# Patient Record
Sex: Male | Born: 1935 | Race: White | Hispanic: No | Marital: Married | Smoking: Never smoker
Health system: Southern US, Community
[De-identification: ages and names within clinical notes are randomized; demographics above are authoritative.]

## PROBLEM LIST (undated history)

## (undated) DIAGNOSIS — J3800 Paralysis of vocal cords and larynx, unspecified: Secondary | ICD-10-CM

## (undated) DIAGNOSIS — C801 Malignant (primary) neoplasm, unspecified: Secondary | ICD-10-CM

## (undated) DIAGNOSIS — I428 Other cardiomyopathies: Secondary | ICD-10-CM

## (undated) DIAGNOSIS — I499 Cardiac arrhythmia, unspecified: Secondary | ICD-10-CM

## (undated) DIAGNOSIS — K589 Irritable bowel syndrome without diarrhea: Secondary | ICD-10-CM

## (undated) DIAGNOSIS — Z7901 Long term (current) use of anticoagulants: Secondary | ICD-10-CM

## (undated) DIAGNOSIS — R0789 Other chest pain: Secondary | ICD-10-CM

## (undated) DIAGNOSIS — I480 Paroxysmal atrial fibrillation: Secondary | ICD-10-CM

## (undated) DIAGNOSIS — R809 Proteinuria, unspecified: Secondary | ICD-10-CM

## (undated) DIAGNOSIS — K219 Gastro-esophageal reflux disease without esophagitis: Secondary | ICD-10-CM

## (undated) DIAGNOSIS — K5790 Diverticulosis of intestine, part unspecified, without perforation or abscess without bleeding: Secondary | ICD-10-CM

## (undated) DIAGNOSIS — F419 Anxiety disorder, unspecified: Secondary | ICD-10-CM

## (undated) DIAGNOSIS — I10 Essential (primary) hypertension: Secondary | ICD-10-CM

## (undated) HISTORY — DX: Other cardiomyopathies: I42.8

## (undated) HISTORY — DX: Malignant (primary) neoplasm, unspecified: C80.1

## (undated) HISTORY — DX: Paralysis of vocal cords and larynx, unspecified: J38.00

## (undated) HISTORY — DX: Proteinuria, unspecified: R80.9

## (undated) HISTORY — PX: CARDIAC CATHETERIZATION: SHX172

## (undated) HISTORY — DX: Other chest pain: R07.89

## (undated) HISTORY — DX: Irritable bowel syndrome, unspecified: K58.9

## (undated) HISTORY — DX: Essential (primary) hypertension: I10

## (undated) HISTORY — DX: Anxiety disorder, unspecified: F41.9

## (undated) HISTORY — DX: Diverticulosis of intestine, part unspecified, without perforation or abscess without bleeding: K57.90

## (undated) HISTORY — DX: Paroxysmal atrial fibrillation: I48.0

## (undated) HISTORY — PX: OTHER SURGICAL HISTORY: SHX169

## (undated) HISTORY — DX: Gastro-esophageal reflux disease without esophagitis: K21.9

## (undated) HISTORY — DX: Long term (current) use of anticoagulants: Z79.01

---

## 2004-07-01 ENCOUNTER — Inpatient Hospital Stay (HOSPITAL_BASED_OUTPATIENT_CLINIC_OR_DEPARTMENT_OTHER): Admission: RE | Admit: 2004-07-01 | Discharge: 2004-07-01 | Payer: Self-pay | Admitting: *Deleted

## 2004-11-18 ENCOUNTER — Ambulatory Visit: Payer: Self-pay | Admitting: Internal Medicine

## 2005-03-02 ENCOUNTER — Ambulatory Visit: Payer: Self-pay | Admitting: Internal Medicine

## 2005-06-08 ENCOUNTER — Ambulatory Visit: Payer: Self-pay | Admitting: Neurology

## 2005-06-26 ENCOUNTER — Ambulatory Visit: Payer: Self-pay | Admitting: Neurology

## 2005-08-05 ENCOUNTER — Ambulatory Visit: Payer: Self-pay | Admitting: Internal Medicine

## 2005-08-16 ENCOUNTER — Emergency Department: Payer: Self-pay | Admitting: Emergency Medicine

## 2005-08-16 ENCOUNTER — Other Ambulatory Visit: Payer: Self-pay

## 2005-08-17 ENCOUNTER — Other Ambulatory Visit: Payer: Self-pay

## 2005-08-21 ENCOUNTER — Ambulatory Visit: Payer: Self-pay | Admitting: Unknown Physician Specialty

## 2005-08-28 ENCOUNTER — Ambulatory Visit: Payer: Self-pay | Admitting: Internal Medicine

## 2005-09-03 ENCOUNTER — Ambulatory Visit: Payer: Self-pay

## 2005-09-22 ENCOUNTER — Ambulatory Visit: Payer: Self-pay | Admitting: Internal Medicine

## 2005-11-25 ENCOUNTER — Ambulatory Visit: Payer: Self-pay | Admitting: Internal Medicine

## 2006-01-26 ENCOUNTER — Ambulatory Visit: Payer: Self-pay | Admitting: Internal Medicine

## 2006-04-09 ENCOUNTER — Ambulatory Visit: Payer: Self-pay | Admitting: Internal Medicine

## 2006-04-30 ENCOUNTER — Ambulatory Visit: Payer: Self-pay | Admitting: Unknown Physician Specialty

## 2006-06-01 ENCOUNTER — Ambulatory Visit: Payer: Self-pay | Admitting: Gastroenterology

## 2006-06-09 ENCOUNTER — Ambulatory Visit: Payer: Self-pay | Admitting: Internal Medicine

## 2006-06-09 ENCOUNTER — Inpatient Hospital Stay (HOSPITAL_COMMUNITY): Admission: EM | Admit: 2006-06-09 | Discharge: 2006-06-10 | Payer: Self-pay | Admitting: Emergency Medicine

## 2006-06-10 ENCOUNTER — Ambulatory Visit: Payer: Self-pay | Admitting: Internal Medicine

## 2006-06-14 ENCOUNTER — Ambulatory Visit: Payer: Self-pay | Admitting: Internal Medicine

## 2006-06-14 ENCOUNTER — Inpatient Hospital Stay (HOSPITAL_BASED_OUTPATIENT_CLINIC_OR_DEPARTMENT_OTHER): Admission: RE | Admit: 2006-06-14 | Discharge: 2006-06-14 | Payer: Self-pay | Admitting: Internal Medicine

## 2006-06-21 ENCOUNTER — Ambulatory Visit: Payer: Self-pay | Admitting: Internal Medicine

## 2006-06-24 ENCOUNTER — Ambulatory Visit: Payer: Self-pay | Admitting: Gastroenterology

## 2006-07-02 ENCOUNTER — Ambulatory Visit: Payer: Self-pay | Admitting: Internal Medicine

## 2006-08-09 ENCOUNTER — Ambulatory Visit: Payer: Self-pay | Admitting: Internal Medicine

## 2006-08-12 ENCOUNTER — Ambulatory Visit: Payer: Self-pay

## 2006-08-18 ENCOUNTER — Ambulatory Visit: Payer: Self-pay | Admitting: Internal Medicine

## 2006-08-31 ENCOUNTER — Ambulatory Visit: Payer: Self-pay | Admitting: Internal Medicine

## 2006-11-25 ENCOUNTER — Ambulatory Visit: Payer: Self-pay | Admitting: Internal Medicine

## 2007-04-04 ENCOUNTER — Ambulatory Visit: Payer: Self-pay | Admitting: Internal Medicine

## 2007-05-27 ENCOUNTER — Ambulatory Visit: Payer: Self-pay | Admitting: Internal Medicine

## 2007-09-22 ENCOUNTER — Ambulatory Visit: Payer: Self-pay | Admitting: Internal Medicine

## 2008-04-16 ENCOUNTER — Ambulatory Visit: Payer: Self-pay | Admitting: Internal Medicine

## 2008-08-04 ENCOUNTER — Emergency Department: Payer: Self-pay | Admitting: Emergency Medicine

## 2008-10-22 ENCOUNTER — Ambulatory Visit: Payer: Self-pay | Admitting: Internal Medicine

## 2009-02-02 ENCOUNTER — Observation Stay (HOSPITAL_COMMUNITY): Admission: EM | Admit: 2009-02-02 | Discharge: 2009-02-03 | Payer: Self-pay | Admitting: Emergency Medicine

## 2009-02-02 ENCOUNTER — Ambulatory Visit: Payer: Self-pay | Admitting: Cardiology

## 2009-02-08 ENCOUNTER — Ambulatory Visit: Payer: Self-pay | Admitting: Gastroenterology

## 2009-02-18 ENCOUNTER — Ambulatory Visit: Payer: Self-pay | Admitting: Internal Medicine

## 2009-06-19 ENCOUNTER — Ambulatory Visit: Payer: Self-pay | Admitting: Cardiology

## 2009-06-19 ENCOUNTER — Telehealth (INDEPENDENT_AMBULATORY_CARE_PROVIDER_SITE_OTHER): Payer: Self-pay | Admitting: Radiology

## 2009-06-19 ENCOUNTER — Encounter: Payer: Self-pay | Admitting: Cardiology

## 2009-06-19 DIAGNOSIS — R079 Chest pain, unspecified: Secondary | ICD-10-CM | POA: Insufficient documentation

## 2009-06-20 ENCOUNTER — Ambulatory Visit: Payer: Self-pay

## 2009-06-20 ENCOUNTER — Encounter: Payer: Self-pay | Admitting: Internal Medicine

## 2009-06-25 ENCOUNTER — Telehealth: Payer: Self-pay | Admitting: Cardiology

## 2009-06-26 ENCOUNTER — Ambulatory Visit: Payer: Self-pay | Admitting: Cardiology

## 2009-06-26 ENCOUNTER — Ambulatory Visit: Payer: Self-pay

## 2009-06-26 ENCOUNTER — Encounter: Payer: Self-pay | Admitting: Cardiology

## 2009-06-28 ENCOUNTER — Ambulatory Visit (HOSPITAL_COMMUNITY): Admission: RE | Admit: 2009-06-28 | Discharge: 2009-06-28 | Payer: Self-pay | Admitting: Cardiology

## 2009-06-28 LAB — CONVERTED CEMR LAB
BUN: 13 mg/dL (ref 6–23)
CO2: 26 meq/L (ref 19–32)
Glucose, Bld: 99 mg/dL (ref 70–99)
Sodium: 137 meq/L (ref 135–145)

## 2009-07-01 ENCOUNTER — Encounter: Payer: Self-pay | Admitting: Cardiology

## 2009-07-01 ENCOUNTER — Ambulatory Visit: Payer: Self-pay | Admitting: Internal Medicine

## 2009-07-01 ENCOUNTER — Encounter (INDEPENDENT_AMBULATORY_CARE_PROVIDER_SITE_OTHER): Payer: Self-pay | Admitting: *Deleted

## 2009-07-03 LAB — CONVERTED CEMR LAB
CO2: 23 meq/L (ref 19–32)
Calcium: 9.1 mg/dL (ref 8.4–10.5)
Chloride: 103 meq/L (ref 96–112)
INR: 1.7 — ABNORMAL HIGH (ref 0.0–1.5)
MCV: 82.1 fL (ref 78.0–100.0)
Potassium: 4.8 meq/L (ref 3.5–5.3)
Prothrombin Time: 20.3 s — ABNORMAL HIGH (ref 11.6–15.2)
RBC: 5.04 M/uL (ref 4.22–5.81)
Sodium: 135 meq/L (ref 135–145)
WBC: 6.8 10*3/uL (ref 4.0–10.5)

## 2009-07-04 ENCOUNTER — Inpatient Hospital Stay (HOSPITAL_BASED_OUTPATIENT_CLINIC_OR_DEPARTMENT_OTHER): Admission: RE | Admit: 2009-07-04 | Discharge: 2009-07-04 | Payer: Self-pay | Admitting: Cardiology

## 2009-07-04 ENCOUNTER — Ambulatory Visit: Payer: Self-pay | Admitting: Cardiology

## 2009-07-05 ENCOUNTER — Telehealth: Payer: Self-pay | Admitting: Cardiology

## 2009-07-08 ENCOUNTER — Ambulatory Visit: Payer: Self-pay | Admitting: Cardiovascular Disease

## 2009-07-22 ENCOUNTER — Ambulatory Visit: Payer: Self-pay | Admitting: Internal Medicine

## 2009-07-22 DIAGNOSIS — I951 Orthostatic hypotension: Secondary | ICD-10-CM | POA: Insufficient documentation

## 2009-07-22 DIAGNOSIS — R42 Dizziness and giddiness: Secondary | ICD-10-CM | POA: Insufficient documentation

## 2009-07-22 DIAGNOSIS — I10 Essential (primary) hypertension: Secondary | ICD-10-CM | POA: Insufficient documentation

## 2010-01-20 ENCOUNTER — Ambulatory Visit: Payer: Self-pay | Admitting: Internal Medicine

## 2010-02-05 ENCOUNTER — Encounter: Payer: Self-pay | Admitting: Internal Medicine

## 2010-06-04 ENCOUNTER — Encounter: Payer: Self-pay | Admitting: Internal Medicine

## 2010-08-04 ENCOUNTER — Ambulatory Visit: Payer: Self-pay | Admitting: Internal Medicine

## 2011-01-07 ENCOUNTER — Ambulatory Visit: Payer: Self-pay | Admitting: Gastroenterology

## 2011-01-20 NOTE — Assessment & Plan Note (Signed)
Summary: F6M/AMD   Visit Type:  Follow-up Primary Provider:  Francia Greaves MD, [facility]  CC:  "Doing well.".  History of Present Illness: [name] is seen in followup for paroxysmal atrial fibrillation in the setting of nonischemic coronary disease by recent catheterization and modest hypertension. He has had no intercurrent episodes since his last visit.  He continues to struggle with some degree of orthostasis. At our last visit we had him change his Flomax from a.m. to p.m. without any significant change. Most of his dizziness is post exertion  blood work was reviewed from Dr. Lin Givens office; the HDL was 41 the LDL was 114 otherwise was normal  Current Medications (verified): 1)  Diltiazem Hcl Coated Beads 120 Mg Xr24h-Cap (Diltiazem Hcl Coated Beads) .... Take 1 Capsule By Mouth Once A Day 2)  Coumadin 2.5 Mg Tabs (Warfarin Sodium) .... As Directed 3)  Flecainide Acetate 50 Mg Tabs (Flecainide Acetate) .... Take One Tablet By Mouth Every 12 Hours 4)  Diovan 40 Mg Tabs (Valsartan) .... Take One Tablet By Mouth Daily 5)  Flomax 0.4 Mg Xr24h-Cap (Tamsulosin Hcl) .... Take 1 By Mouth Once Daily 6)  Gabapentin 300 Mg Caps (Gabapentin) .... Take At Bedtime As Needed 7)  Protonix 40 Mg Tbec (Pantoprazole Sodium) .... Take 1 By Mouth Once Daily 8)  Alprazolam 0.25 Mg Tabs (Alprazolam) .... Take As Needed 9)  Cipro 500 Mg Tabs (Ciprofloxacin Hcl) .... As Directed For Inflammatio of Prostate 10)  Coq-10 50 Mg Caps (Coenzyme Q10) .Marland Kitchen.. 1 By Mouth Once Daily 11)  Co Q-10 30 Mg Caps (Coenzyme Q10) .Marland Kitchen.. 1 By Mouth Once Daily 12)  Fish Oil 1000 Mg Caps (Omega-3 Fatty Acids) .Marland Kitchen.. 1 By Mouth Once Daily 13)  Flax Seed Oil 1000 Mg Caps (Flaxseed (Linseed)) .Marland Kitchen.. 1 By Mouth Once Daily 14)  [employer] Senior  Tabs (Multiple Vitamins-Minerals) .Marland Kitchen.. 1 By Mouth Once Daily 15)  Vitamin D3 400 Unit Tabs (Cholecalciferol) .Marland Kitchen.. 1 By Mouth Once Daily  Allergies (verified): 1)  ! Sulfa  Past  History:  Past Medical History: Last updated: 07/22/2009 1. Nonobstructive coronary artery disease.     a.     By cardiac catheterization in 2007.  Repeat 2010 also essentially normal     b.     Atypical chest pain 2/10, again in 6/10-7/10.  ETT-myoview was done 7/10.  Pt exercised 10 minutes, 11.7 METS, but only reached 75% MPHR.  He stopped due to fatigue.  No ECG changes.  There appeared to be a small area of apical lateral ischemia.  EF was calculated at 49% but appeared better visually.  Confirmatory echo showed normal LV systolic function.  2. Normal ventricular function. 3. Paroxysmal atrial fibrillation.     a.     Treated with flecainide. 4. Chronic Coumadin. 5. Gastroesophageal reflux disease.  6. Irritable bowel syndrome 7. Diverticulosis 8. HTN  Past Surgical History: Last updated: 01/20/2010 none  Family History: Last updated: 06/19/2009 Was reviewed and is significant for his parents living to old age.      Social History: Last updated: 06/19/2009 The patient is married and lives in [place] with his wife.  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  There is no history of tobacco or alcohol use.  He has three children.  He exercises twice a day.  Vital Signs:  Patient profile:   77 year old male Height:      71 inches Weight:      198 pounds BMI:  27.72 Pulse rate:   60 / minute BP sitting:   138 / 82  (right arm) Cuff size:   regular  Vitals Entered By: Bishop Dublin, CMA (August 04, 2010 4:16 PM)  Physical Exam  General:  The patient was alert and oriented in no acute distress. HEENT Normal.  Neck veins were flat, carotids were brisk.  Lungs were clear.  Heart sounds were regular without murmurs or gallops.  Abdomen was soft with active bowel sounds. There is no clubbing cyanosis or edema. Skin Warm and dry    Impression & Recommendations:  Problem # 1:  ATRIAL FIBRILLATION (ICD-427.31) no intercurrent AF His updated medication list for this problem  includes:    Coumadin 2.5 Mg Tabs (Warfarin sodium) .Marland Kitchen... As directed    Flecainide Acetate 50 Mg Tabs (Flecainide acetate) .Marland Kitchen... Take one tablet by mouth every 12 hours  Orders: EKG w/ Interpretation (93000)  Problem # 2:  HYPERTENSION, BENIGN (ICD-401.1) adequately controlled; unfortunately complicated by some degree of orthostasis. Will continue him on his current medications and we again reviewed maneuvers to try to minimize symptoms His updated medication list for this problem includes:    Diltiazem Hcl Coated Beads 120 Mg Xr24h-cap (Diltiazem hcl coated beads) .Marland Kitchen... Take 1 capsule by mouth once a day    Diovan 40 Mg Tabs (Valsartan) .Marland Kitchen... Take one tablet by mouth daily  Problem # 3:  DIZZINESS (ICD-780.4) as noted above; maneuvers to try to minimize symptoms post exertion will probably be our best therapeutic target  Patient Instructions: 1)  Your physician recommends that you continue on your current medications as directed. Please refer to the Current Medication list given to you today. 2)  Your physician wants you to follow-up in:   6 months You will receive a reminder letter in the mail two months in advance. If you don't receive a letter, please call our office to schedule the follow-up appointment.

## 2011-01-20 NOTE — Assessment & Plan Note (Signed)
Summary: F4M/AMD   Visit Type:  Follow-up Primary Provider:  Francia Greaves MD, [facility]  CC:  no complaints.  History of Present Illness: [name] is seen in followup for paroxysmal atrial fibrillation in the setting of nonischemic coronary disease by recent catheterization and modest hypertension. He has had a couple of episodes over the last 4 months. One about 3 months ago that lasted a couple of hours while he was sleeping. He has had 2 in the last 3 weeks both of which have been relatively short; the latter of which occurred while napping.  He is to see Dr. Lin Burch in the next couple of weeks and his basic laboratories will be repeated at that time  Current Problems (verified): 1)  Hypertension, Benign  (ICD-401.1) 2)  Dizziness  (ICD-780.4) 3)  Atrial Fibrillation  (ICD-427.31) 4)  Chest Pain-unspecified  (ICD-786.50)  Current Medications (verified): 1)  Diltiazem Hcl Coated Beads 120 Mg Xr24h-Cap (Diltiazem Hcl Coated Beads) .... Take 1 Capsule By Mouth Once A Day 2)  Coumadin 2.5 Mg Tabs (Warfarin Sodium) .... As Directed 3)  Flecainide Acetate 50 Mg Tabs (Flecainide Acetate) .... Take One Tablet By Mouth Every 12 Hours 4)  Diovan 40 Mg Tabs (Valsartan) .... Take One Tablet By Mouth Daily 5)  Flomax 0.4 Mg Xr24h-Cap (Tamsulosin Hcl) .... Take 1 By Mouth Once Daily 6)  Gabapentin 300 Mg Caps (Gabapentin) .... Take At Bedtime As Needed 7)  Protonix 40 Mg Tbec (Pantoprazole Sodium) .... Take 1 By Mouth Once Daily 8)  Alprazolam 0.25 Mg Tabs (Alprazolam) .... Take As Needed 9)  Cipro 500 Mg Tabs (Ciprofloxacin Hcl) .... As Directed For Inflammatio of Prostate 10)  Coq-10 50 Mg Caps (Coenzyme Q10) .Marland Kitchen.. 1 By Mouth Once Daily 11)  Co Q-10 30 Mg Caps (Coenzyme Q10) .Marland Kitchen.. 1 By Mouth Once Daily 12)  Fish Oil 1000 Mg Caps (Omega-3 Fatty Acids) .Marland Kitchen.. 1 By Mouth Once Daily 13)  Flax Seed Oil 1000 Mg Caps (Flaxseed (Linseed)) .Marland Kitchen.. 1 By Mouth Once Daily 14)  Cent[employer] Senior  Tabs  (Multiple Vitamins-Minerals) .Marland Kitchen.. 1 By Mouth Once Daily 15)  Vitamin D3 400 Unit Tabs (Cholecalciferol) .Marland Kitchen.. 1 By Mouth Once Daily  Allergies (verified): 1)  ! Sulfa  Past History:  Past Medical History: Last updated: 07/22/2009 1. Nonobstructive coronary artery disease.     a.     By cardiac catheterization in 2007.  Repeat 2010 also essentially normal     b.     Atypical chest pain 2/10, again in 6/10-7/10.  ETT-myoview was done 7/10.  Pt exercised 10 minutes, 11.7 METS, but only reached 75% MPHR.  He stopped due to fatigue.  No ECG changes.  There appeared to be a small area of apical lateral ischemia.  EF was calculated at 49% but appeared better visually.  Confirmatory echo showed normal LV systolic function.  2. Normal ventricular function. 3. Paroxysmal atrial fibrillation.     a.     Treated with flecainide. 4. Chronic Coumadin. 5. Gastroesophageal reflux disease.  6. Irritable bowel syndrome 7. Diverticulosis 8. HTN  Family History: Last updated: 06/19/2009 Was reviewed and is significant for his parents living to old age.      Social History: Last updated: 06/19/2009 The patient is married and lives in [place] with his wife.  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  There is no history of tobacco or alcohol use.  He has three children.  He exercises twice a day.  Past Surgical  History: none   Vital Signs:  Patient profile:   77 year old male Height:      71 inches Weight:      199.50 pounds BMI:     27.93 Pulse rate:   62 / minute Pulse rhythm:   regular BP sitting:   138 / 72  (right arm) Cuff size:   regular  Vitals Entered By: Mercer Pod (January 20, 2010 9:53 AM)  Physical Exam  General:  The patient was alert and oriented in no acute distress. HEENT Normal.  Neck veins were flat, carotids were brisk.  Lungs were clear.  Heart sounds were regular without murmurs or gallops.  Abdomen was soft with active bowel sounds. There is no clubbing cyanosis  or edema. Skin Warm and dry    Impression & Recommendations:  Problem # 1:  ATRIAL FIBRILLATION (ICD-427.31)  patient continues to have paroxysms of atrial fibrillation. In the event that the frequency continues to be at this higherRate, we will need to make sure that there is nothing changed with his metabolic substrate i.e. thyroid and or hemoglobin.  We may have to consider up titrating his flecainide   His updated medication list for this problem includes:    Coumadin 2.5 Mg Tabs (Warfarin sodium) .Marland Kitchen... As directed    Flecainide Acetate 50 Mg Tabs (Flecainide acetate) .Marland Kitchen... Take one tablet by mouth every 12 hours  Problem # 2:  HYPERTENSION, BENIGN (ICD-401.1) well controlled on his current medication His updated medication list for this problem includes:    Diltiazem Hcl Coated Beads 120 Mg Xr24h-cap (Diltiazem hcl coated beads) .Marland Kitchen... Take 1 capsule by mouth once a day    Diovan 40 Mg Tabs (Valsartan) .Marland Kitchen... Take one tablet by mouth daily

## 2011-02-06 ENCOUNTER — Encounter: Payer: Self-pay | Admitting: Cardiovascular Disease

## 2011-02-06 ENCOUNTER — Encounter (INDEPENDENT_AMBULATORY_CARE_PROVIDER_SITE_OTHER): Payer: Medicare Other

## 2011-02-06 ENCOUNTER — Encounter: Payer: Self-pay | Admitting: Cardiology

## 2011-02-06 ENCOUNTER — Telehealth: Payer: Self-pay | Admitting: Cardiology

## 2011-02-06 DIAGNOSIS — I10 Essential (primary) hypertension: Secondary | ICD-10-CM

## 2011-02-06 DIAGNOSIS — R002 Palpitations: Secondary | ICD-10-CM

## 2011-02-13 ENCOUNTER — Ambulatory Visit (INDEPENDENT_AMBULATORY_CARE_PROVIDER_SITE_OTHER): Payer: Medicare Other | Admitting: Internal Medicine

## 2011-02-13 ENCOUNTER — Encounter: Payer: Self-pay | Admitting: Internal Medicine

## 2011-02-13 DIAGNOSIS — I4949 Other premature depolarization: Secondary | ICD-10-CM

## 2011-02-13 DIAGNOSIS — R5381 Other malaise: Secondary | ICD-10-CM

## 2011-02-13 DIAGNOSIS — I4891 Unspecified atrial fibrillation: Secondary | ICD-10-CM

## 2011-02-17 NOTE — Assessment & Plan Note (Signed)
Summary: f5m/amd/smw   Visit Type:  Follow-up Primary Provider:  Francia Greaves MD, [facility]  CC:  c/o palpitations. denies chest pain and SOB.Marland Kitchen  History of Present Illness: [name] is seen in followup for paroxysmal atrial fibrillation in the setting of nonischemic coronary disease by recent catheterization and modest hypertension. He has had no intercurrent episodes since his last visit.  his dizziness is much improved.  He recounts that when he had his atrial fibrillation episodes whichhe has not had any in a year that they are associated with significant posttermination lightheadedness  he recently had significant palpitations. He describes these as a pause followed by a very heavy beat. His are consistent with PVCs. Note that they are not present with exercise. The are frequently postprandial. blood work was reviewed from Dr. Lin Givens office; the HDL was 41 the LDL was 114 otherwise was normal  Current Medications (verified): 1)  Diltiazem Hcl Coated Beads 120 Mg Xr24h-Cap (Diltiazem Hcl Coated Beads) .... Take 1 Capsule By Mouth Once A Day 2)  Coumadin 2.5 Mg Tabs (Warfarin Sodium) .... As Directed 3)  Flecainide Acetate 50 Mg Tabs (Flecainide Acetate) .... Take One Tablet By Mouth Every 12 Hours 4)  Diovan 40 Mg Tabs (Valsartan) .... Take One Tablet By Mouth Daily 5)  Flomax 0.4 Mg Xr24h-Cap (Tamsulosin Hcl) .... Take 1 By Mouth Once Daily 6)  Gabapentin 300 Mg Caps (Gabapentin) .... Take At Bedtime As Needed 7)  Protonix 40 Mg Tbec (Pantoprazole Sodium) .... Take 1 By Mouth Once Daily 8)  Alprazolam 0.25 Mg Tabs (Alprazolam) .... Take As Needed 9)  Cipro 500 Mg Tabs (Ciprofloxacin Hcl) .... As Directed For Inflammatio of Prostate 10)  Coq-10 50 Mg Caps (Coenzyme Q10) .Marland Kitchen.. 1 By Mouth Once Daily 11)  Co Q-10 30 Mg Caps (Coenzyme Q10) .Marland Kitchen.. 1 By Mouth Once Daily 12)  Fish Oil 1000 Mg Caps (Omega-3 Fatty Acids) .Marland Kitchen.. 1 By Mouth Once Daily 13)  Flax Seed Oil 1000 Mg Caps  (Flaxseed (Linseed)) .Marland Kitchen.. 1 By Mouth Once Daily 14)  [employer] Senior  Tabs (Multiple Vitamins-Minerals) .Marland Kitchen.. 1 By Mouth Once Daily 15)  Vitamin D3 400 Unit Tabs (Cholecalciferol) .Marland Kitchen.. 1 By Mouth Once Daily  Allergies (verified): 1)  ! Sulfa  Past History:  Past Medical History: Last updated: 07/22/2009 1. Nonobstructive coronary artery disease.     a.     By cardiac catheterization in 2007.  Repeat 2010 also essentially normal     b.     Atypical chest pain 2/10, again in 6/10-7/10.  ETT-myoview was done 7/10.  Pt exercised 10 minutes, 11.7 METS, but only reached 75% MPHR.  He stopped due to fatigue.  No ECG changes.  There appeared to be a small area of apical lateral ischemia.  EF was calculated at 49% but appeared better visually.  Confirmatory echo showed normal LV systolic function.  2. Normal ventricular function. 3. Paroxysmal atrial fibrillation.     a.     Treated with flecainide. 4. Chronic Coumadin. 5. Gastroesophageal reflux disease.  6. Irritable bowel syndrome 7. Diverticulosis 8. HTN  Past Surgical History: Last updated: 01/20/2010 none  Family History: Last updated: 06/19/2009 Was reviewed and is significant for his parents living to old age.      Social History: Last updated: 06/19/2009 The patient is married and lives in [place] with his wife.  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  There is no history of tobacco or alcohol use.  He has three  children.  He exercises twice a day.  Vital Signs:  Patient profile:   77 year old male Height:      71 inches Weight:      198.25 pounds BMI:     27.75 Pulse rate:   54 / minute BP sitting:   108 / 70  (left arm)  Vitals Entered By: Lysbeth Galas CMA (February 13, 2011 10:18 AM)  Physical Exam  General:  The patient was alert and oriented in no acute distress. HEENT Normal.  Neck veins were flat, carotids were brisk.  Lungs were clear.  Heart sounds were regular without murmurs or gallops.  Abdomen was soft with  active bowel sounds. There is no clubbing cyanosis or edema. Skin Warm and dry    Impression & Recommendations:  Problem # 1:  ATRIAL FIBRILLATION (ICD-427.31) he will continue on his flecainide. We had a very lengthy discussion today related to the physiology of regularization of atrial fibrillation and the need for concomitant AV nodal blocking therapy. We will continue on his Coumadin. His updated medication list for this problem includes:    Coumadin 2.5 Mg Tabs (Warfarin sodium) .Marland Kitchen... As directed    Flecainide Acetate 50 Mg Tabs (Flecainide acetate) .Marland Kitchen... Take one tablet by mouth every 12 hours  Orders: EKG w/ Interpretation (93000)  Problem # 2:  PRESYNCOPE-POSTTERMINATION (ICD-780.4) Iconcerned about these presyncopal episodes associated with termination of his atrial fibrillation. I suspect is he having post termination pauses. 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 Will have him increase his magnesium supplementation. blood results are pending. We will review the potassium His updated medication list for this problem includes:    Diltiazem Hcl Coated Beads 120 Mg Xr24h-cap (Diltiazem hcl coated beads) .Marland Kitchen... Take 1 capsule by mouth once a day    Coumadin 2.5 Mg Tabs (Warfarin sodium) .Marland Kitchen... As directed    Flecainide Acetate 50 Mg Tabs (Flecainide acetate) .Marland Kitchen... Take one tablet by mouth every 12 hours  Problem # 4:  HYPERTENSION, BENIGN (ICD-401.1) well-controlled currently. I also wonder whether he may not feel to stop his Diovan now that he is on diltiazem. I haven't discussed this with his primary care physician. His updated medication list for this problem includes:    Diltiazem Hcl Coated Beads 120 Mg Xr24h-cap (Diltiazem hcl coated beads) .Marland Kitchen... Take 1 capsule by mouth once a day    Diovan 40 Mg Tabs (Valsartan) .Marland Kitchen... Take one tablet by mouth daily  Patient  Instructions: 1)  Your physician recommends that you schedule a follow-up appointment in: 6 months 2)  Your physician has recommended you make the following change in your medication: INCREASE Magnesium from 125mg  to 250mg  once daily.

## 2011-02-17 NOTE — Progress Notes (Signed)
Summary: Palpitations  Phone Note Call from Patient Call back at Home Phone [phone]   Caller: Self Call For: Graciela Husbands Summary of Call: Pt is c/o palpitations and wants to be checked.  Will be leaving home around 9:00 am for a 9:30 appt. Initial call taken by: Harlon Flor,  February 06, 2011 8:02 AM  Follow-up for Phone Call        patient called back with skipping heart beats, denies shortness of breath nor chest pain.  He does take flecanide for parxo. a-fib.  He feels that in the p.m. the symptoms of palpitations seem more frequent. The symptoms started about two days ago. The patient has a follow up appt. with Dr. Graciela Husbands on Feb. 24, 2011.  [phone] or [phone]. Follow-up by: Bishop Dublin, CMA,  February 06, 2011 8:27 AM  Additional Follow-up for Phone Call Additional follow up Details #1::        Told patient to come for a nurse visit to see if in A-Fib. Additional Follow-up by: Bishop Dublin, CMA,  February 06, 2011 10:34 AM     Appended Document: Palpitations V paced rhyth with bigeminy (PVC every other0. Not seen previously. Very mildly symptomatic. ALready on a B-blocker. He will follow up with Graciela Husbands and [employer]

## 2011-02-17 NOTE — Assessment & Plan Note (Signed)
Summary: Palpitations. ? A-Fib.  Nurse Visit   Vital Signs:  Patient profile:   77 year old male Height:      71 inches Weight:      197 pounds BMI:     27.58 Pulse rate:   52 / minute BP sitting:   132 / 81  (left arm) Cuff size:   regular  Vitals Entered By: Bishop Dublin, CMA (February 06, 2011 10:56 AM) CC: c/o palpiations with feeling frequent spell late in the afternoon.   Denies shortness of breath or chest pain. Comments Pt's EKG shows SR Bradycardia HR 52. Pt currently taking Flecainide 50mg  two times a day and Diltiazem 120mg  once daily. Pt states he feels sensation of "skipped beat" possibly PVC while at rest, has no problems while working out etc. Pt has f/u with Dr. Graciela Husbands next Friday 02/13/11. Pt will monitor symptoms, and otherwise will f/u next week.   Visit Type:  nurse visit  CC:  c/o palpiations with feeling frequent spell late in the afternoon.   Denies shortness of breath or chest pain..   Past History:  Past Medical History: Last updated: 07/22/2009 1. Nonobstructive coronary artery disease.     a.     By cardiac catheterization in 2007.  Repeat 2010 also essentially normal     b.     Atypical chest pain 2/10, again in 6/10-7/10.  ETT-myoview was done 7/10.  Pt exercised 10 minutes, 11.7 METS, but only reached 75% MPHR.  He stopped due to fatigue.  No ECG changes.  There appeared to be a small area of apical lateral ischemia.  EF was calculated at 49% but appeared better visually.  Confirmatory echo showed normal LV systolic function.  2. Normal ventricular function. 3. Paroxysmal atrial fibrillation.     a.     Treated with flecainide. 4. Chronic Coumadin. 5. Gastroesophageal reflux disease.  6. Irritable bowel syndrome 7. Diverticulosis 8. HTN  Past Surgical History: Last updated: 01/20/2010 none  Family History: Last updated: 06/19/2009 Was reviewed and is significant for his parents living to old age.      Social History: Last updated:  06/19/2009 The patient is married and lives in [place] with his wife.  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  There is no history of tobacco or alcohol use.  He has three children.  He exercises twice a day.   Current Medications (verified): 1)  Diltiazem Hcl Coated Beads 120 Mg Xr24h-Cap (Diltiazem Hcl Coated Beads) .... Take 1 Capsule By Mouth Once A Day 2)  Coumadin 2.5 Mg Tabs (Warfarin Sodium) .... As Directed 3)  Flecainide Acetate 50 Mg Tabs (Flecainide Acetate) .... Take One Tablet By Mouth Every 12 Hours 4)  Diovan 40 Mg Tabs (Valsartan) .... Take One Tablet By Mouth Daily 5)  Flomax 0.4 Mg Xr24h-Cap (Tamsulosin Hcl) .... Take 1 By Mouth Once Daily 6)  Gabapentin 300 Mg Caps (Gabapentin) .... Take At Bedtime As Needed 7)  Protonix 40 Mg Tbec (Pantoprazole Sodium) .... Take 1 By Mouth Once Daily 8)  Alprazolam 0.25 Mg Tabs (Alprazolam) .... Take As Needed 9)  Cipro 500 Mg Tabs (Ciprofloxacin Hcl) .... As Directed For Inflammatio of Prostate 10)  Coq-10 50 Mg Caps (Coenzyme Q10) .Marland Kitchen.. 1 By Mouth Once Daily 11)  Co Q-10 30 Mg Caps (Coenzyme Q10) .Marland Kitchen.. 1 By Mouth Once Daily 12)  Fish Oil 1000 Mg Caps (Omega-3 Fatty Acids) .Marland Kitchen.. 1 By Mouth Once Daily 13)  Flax Seed Oil 1000 Mg  Caps (Flaxseed (Linseed)) .Marland Kitchen.. 1 By Mouth Once Daily 14)  [employer] Senior  Tabs (Multiple Vitamins-Minerals) .Marland Kitchen.. 1 By Mouth Once Daily 15)  Vitamin D3 400 Unit Tabs (Cholecalciferol) .Marland Kitchen.. 1 By Mouth Once Daily  Allergies (verified): 1)  ! Sulfa

## 2011-04-07 LAB — CBC
HCT: 41.9 % (ref 39.0–52.0)
MCV: 84.5 fL (ref 78.0–100.0)
Platelets: 321 10*3/uL (ref 150–400)
RDW: 15.2 % (ref 11.5–15.5)
RDW: 15.6 % — ABNORMAL HIGH (ref 11.5–15.5)

## 2011-04-07 LAB — BASIC METABOLIC PANEL
BUN: 11 mg/dL (ref 6–23)
CO2: 27 mEq/L (ref 19–32)
Chloride: 103 mEq/L (ref 96–112)
GFR calc Af Amer: >60 mL/min (ref >60)
GFR calc non Af Amer: >60 mL/min (ref >60)
Potassium: 4.1 mEq/L (ref 3.5–5.1)
Sodium: 134 mEq/L — ABNORMAL LOW (ref 135–145)

## 2011-04-07 LAB — CARDIAC PANEL(CRET KIN+CKTOT+MB+TROPI)
Relative Index: INVALID (ref 0.0–2.5)
Relative Index: INVALID (ref 0.0–2.5)
Total CK: 80 U/L (ref 7–232)

## 2011-04-07 LAB — DIFFERENTIAL
Basophils Relative: 1 % (ref 0–1)
Lymphocytes Relative: 21 % (ref 12–46)
Lymphs Abs: 1.9 10*3/uL (ref 0.7–4.0)
Monocytes Absolute: 0.6 10*3/uL (ref 0.1–1.0)
Monocytes Relative: 7 % (ref 3–12)
Neutro Abs: 6.2 10*3/uL (ref 1.7–7.7)
Neutrophils Relative %: 70 % (ref 43–77)

## 2011-04-07 LAB — COMPREHENSIVE METABOLIC PANEL
ALT: 16 U/L (ref 0–53)
Albumin: 3.6 g/dL (ref 3.5–5.2)
Alkaline Phosphatase: 60 U/L (ref 39–117)
CO2: 27 mEq/L (ref 19–32)
Calcium: 9 mg/dL (ref 8.4–10.5)
Creatinine, Ser: 1.07 mg/dL (ref 0.4–1.5)
GFR calc non Af Amer: >60 mL/min (ref >60)
Sodium: 136 mEq/L (ref 135–145)
Total Bilirubin: 0.7 mg/dL (ref 0.3–1.2)

## 2011-04-07 LAB — CK TOTAL AND CKMB (NOT AT ARMC): CK, MB: 0.8 ng/mL (ref 0.3–4.0)

## 2011-04-07 LAB — PROTIME-INR: Prothrombin Time: 23.1 seconds — ABNORMAL HIGH (ref 11.6–15.2)

## 2011-04-07 LAB — TROPONIN I: Troponin I: <0.01 ng/mL (ref 0.00–0.06)

## 2011-04-27 ENCOUNTER — Telehealth: Payer: Self-pay | Admitting: Emergency Medicine

## 2011-04-27 NOTE — Telephone Encounter (Signed)
Pt called in today stating he wanted to f/u with you sooner. Scheduled pt for June 4th. Pt wanted me to let you know that he is having problems with going into A Fib more frequently. He wanted to know if he needed to change anything in his medication before his f/u on June 4th.

## 2011-05-04 NOTE — Telephone Encounter (Signed)
You can move up his appontiment if necessary thjanks steve

## 2011-05-05 NOTE — H&P (Signed)
NAME:  Robert Burch, Robert Burch NO.:  [id number]   MEDICAL RECORD NO.:  [id number]          PATIENT TYPE:  EMS   LOCATION:  MAJO                         FACILITY:  [facility]   PHYSICIAN:  Jonelle Sidle, MD DATE OF BIRTH:  20-Jan-1933   DATE OF ADMISSION:  02/02/2009  DATE OF DISCHARGE:                              HISTORY & PHYSICAL   PRIMARY CARDIOLOGIST:  Dr. Sherryl Manges.   PRIMARY CARE PHYSICIAN:  Dr. Francia Greaves.   REASON FOR ADMISSION:  Chest pain.   HISTORY OF PRESENT ILLNESS:  Robert Burch is a pleasant 77 year old male  with a history of paroxysmal atrial fibrillation that has been  reasonably well-controlled on flecainide, Cardizem CD and Coumadin.  He  was last seen by Dr. Graciela Husbands in the [facility] office back in November  2009 and reports that he did have two break-through episodes of atrial  fibrillation in January since that time, although has done reasonably  well over the last month.  He typically is active, exercising twice a  day by swimming in the morning and walking in the evening.  He underwent  a cardiac catheterization back in June 2007, which demonstrated minor  coronary atherosclerosis without any obstructive lesions and an overall  normal ejection fraction of 55% without mitral regurgitation.  He does  not report any typical exertional chest pain.  He states that since  Thursday of this week he has been experiencing an aching sensation in  the chest radiating across the shoulders, also associated with a feeling  of numbness in his fingers and toes and some weakness.  He thought that  it might be indigestion.  He can think of no specific precipitant for  this and in fact has gone ahead and exercised with the symptoms noting  no increase in intensity or duration.  He states that these symptoms  typically have a sudden onset and offset, although he is fairly clear in  not describing any palpitations with this.  He states that the symptoms  are  different from his typical atrial fibrillation.  He has continued to  manifest symptoms even today and became concerned, presenting to the  emergency department for further evaluation.  At this point, his cardiac  markers are normal and his electrocardiogram shows sinus rhythm at 60  beats per minute with a QRS duration of 120 milliseconds and nonspecific  T-wave changes.  Previous comparison tracing from 2007 available to me  now showed atrial fibrillation with nonspecific ST-T wave changes.  He  reports compliance with his medications and otherwise no typical reflux  symptoms.   ALLERGIES:  SULFA drugs.   MEDICATIONS AT HOME:  Include:  1. flecainide 50 mg p.o. b.i.d.  2. Diovan 40 mg p.o. daily.  3. Protonix 40 mg p.o., daily.  4. Gabapentin 300 mg p.o. q.h.s.  5. Flomax 0.4 mg p.o. daily.  6. Coumadin 2.5 mg p.o. daily.  7. Cardizem CD 120 mg p.o. daily.   PAST MEDICAL HISTORY:  Is as outlined above.  He has a history of:  1. Prostatitis.  2. Hypertension.  3. Gastroesophageal reflux disease.  SOCIAL HISTORY:  The patient is married and lives in [place] with his wife.  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  There is no history of tobacco or  alcohol use.  He has three children.  He exercises twice a day as  outlined.   FAMILY HISTORY:  Was reviewed and is significant for his parents living  to old age.   REVIEW OF SYSTEMS:  No recent cough, fevers or chills.  He has had no  recent palpitations.  No dizziness or syncope.  No bleeding problems on  Coumadin.  Otherwise negative.   EXAMINATION:  Heart rate is in the 70s in sinus rhythm.  The patient is  not hypoxic and is afebrile, blood pressure was 130/70 (he states that  it was as high as 160/90 at home when he checked it), respirations 18  and unlabored.  This is a well-nourished male in no acute distress without active  symptoms wearing glasses.  HEENT: Conjunctiva is normal.  Oropharynx clear.  NECK: Supple.  No  elevated jugular venous pressure, loud bruits or  thyromegaly.  LUNGS: Clear lungs with breathing at rest.  No wheezing or rhonchi.  CARDIAC:  Exam reveals a regular rate and rhythm.  Soft S4.  No loud  systolic murmur or pericardial rub.  ABDOMEN: Soft, nontender, good bowel sounds.  EXTREMITIES:  Exhibit no frank pitting edema.  Distal pulse are 2+.  SKIN:  Warm and dry.  MUSCULOSKELETAL:  No ecchymoses noted.  NEUROPSYCHIATRIC:  The patient alert x3.  Affect is appropriate.   LABORATORY DATA:  WBC 8.8, hemoglobin 14.1, platelets 321, INR 1.9.  Sodium 136, potassium 4.9, chloride with 103, bicarb 27, glucose 102,  BUN 10, creatinine 0.1, CK 57, CK-MB 0.8, troponin-I less than 0.01.   Chest x-ray demonstrates no acute process with some hyperinflation and  no focal air space disease.   IMPRESSION:  1. Atypical chest pain syndrome as detailed above, somewhat different      from previous chest pain symptoms and described as possibly      indigestion, although he is not bothered by these symptoms      typically on Protonix.  There has been no clear exertional      component or associated palpitations.  His initial point of care      cardiac markers are normal and his electrocardiogram is      nonspecific.  No interval ischemic testing since 2007.  2. Mild coronary atherosclerosis by cardiac catheterization in June      2007 with normal ejection fraction.  3. History of paroxysmal atrial fibrillation, reasonably well managed      on flecainide, Cardizem CD, and Coumadin.  Last break-through was      in January.   RECOMMENDATIONS:  I discussed the situation with the patient and his  wife.  We will arrange an observation admission overnight to cycle a  full set of markers, follow telemetry to exclude any transient  dysrhythmias, and have  him ambulate and evaluate for return of symptoms.  We will continue his  present outpatient regimen.  Follow-up labs will be obtained.  If he is   clinically stable and workup is unrevealing, we will anticipate  discharge home tomorrow for further outpatient evaluation in the office.  Otherwise, we can proceed as necessary.      Jonelle Sidle, MD  Electronically Signed     SGM/MEDQ  D:  02/02/2009  T:  02/02/2009  Job:  [id number]  cc:   Duke Salvia, MD, Rondel Oh, MD Johnnette Litter

## 2011-05-05 NOTE — Discharge Summary (Signed)
NAME:  Robert Burch, Robert Burch NO.:  [id number]   MEDICAL RECORD NO.:  [id number]          PATIENT TYPE:  INP   LOCATION:  2036                         FACILITY:  [facility]   PHYSICIAN:  Luis Abed, MD, FACCDATE OF BIRTH:  March 10, 1933   DATE OF ADMISSION:  02/02/2009  DATE OF DISCHARGE:  02/03/2009                               DISCHARGE SUMMARY   PRIMARY CARDIOLOGIST:  Duke Salvia, MD, [facility].   FINAL DIAGNOSES:  1. Atypical chest pain.      a.     Normal serial cardiac markers.   SECONDARY DIAGNOSES:  1. Nonobstructive coronary artery disease.      a.     By cardiac catheterization in 2007.  2. Normal ventricular function.  3. Paroxysmal atrial fibrillation.      a.     Treated with flecainide.  4. Chronic Coumadin.  5. Gastroesophageal reflux disease.   REASON FOR ADMISSION:  Mr. Mckinnon is a 77 year old male, with history  of nonobstructive coronary artery disease, who presented to the  emergency room with complaint of chest pain.  His symptoms were felt to  be atypical, and he was admitted for overnight observation and rule out  of myocardial infarction.   HOSPITAL COURSE:  The patient ruled out for myocardial infarction with  all serial cardiac markers within normal limits.  He was allowed to  ambulate and reported no symptoms suggestive of angina pectoris.   No evidence of dysrhythmia was noted on telemetry, during his brief  stay.  Serial EKGs indicated normal sinus rhythm/sinus bradycardia with  rates in the 55-60 bpm range.   Medication adjustments are notable only for the addition of aspirin,  reduced to low dose by time of discharge.  The patient is on chronic  Coumadin and maintained at therapeutic INR levels during his brief stay.   Further evaluation as an outpatient will be deferred to both his primary  cardiologist, Dr. Sherryl Manges, as well as to his primary care  physician, Dr. Francia Greaves in [place].   DISCHARGE DISPOSITION:   Stable.   DISCHARGE MEDICATIONS:  1. Enteric-coated aspirin 81 mg daily (new).  2. Coumadin 2.5 mg, as directed.  3. Cardizem 120 mg daily.  4. Flecainide 50 mg b.i.d.  5. Diovan 40 mg daily.  6. Flomax 0.4 mg daily.  7. Gabapentin 300 mg nightly, as needed.  8. Protonix 40 mg daily.   DISCHARGE DURATION:  Greater than 30 minutes.   DISCHARGE INSTRUCTIONS:  1. Arrangements will be made for Mr. Virgil to follow up with Dr.      Sherryl Manges, either in the [facility] or in our [facility].  The patient will be notified of the scheduled appointment.  2. The patient is also instructed to range follow up with Dr. Francia Greaves in the following 1-2 weeks.  She also follows and manages      his Coumadin dose.      Gene Serpe, PA-C      Luis Abed, MD, [facility]  Electronically Signed  GS/MEDQ  D:  02/03/2009  T:  02/03/2009  Job:  [id number]   cc:   [name] MD Lin Givens

## 2011-05-05 NOTE — Telephone Encounter (Signed)
Pt.notified

## 2011-05-05 NOTE — Cardiovascular Report (Signed)
NAME:  Robert Burch, VANDERWALL NO.:  [id number]   MEDICAL RECORD NO.:  [id number]          PATIENT TYPE:  OIB   LOCATION:  1962                         FACILITY:  [facility]   PHYSICIAN:  Marca Ancona, MD      DATE OF BIRTH:  [birth year]   DATE OF PROCEDURE:  07/04/2009  DATE OF DISCHARGE:                            CARDIAC CATHETERIZATION   PROCEDURES:  1. Left heart catheterization.  2. Coronary angiography.  3. Left ventriculography.   INDICATION:  This is a 77 year old with history of proximal atrial  fibrillation who has had multiple episodes of chest pain.  He did have  an equivocal Myoview done.  Given the fact that he is on flecainide, we  decided it would be safest to proceed with a left heart catheterization.   PROCEDURE NOTE:  After informed consent was obtained, the right groin  was sterilely prepped and draped, 1% lidocaine was used locally to  anesthetize the right groin area.  The right common femoral artery was  accessed using Seldinger technique and a 4-French arterial sheath was  placed.  The left coronary artery was engaged using the 4-French JL-4  catheter, the right coronary artery was engaged using the 4-French 3-D  RCA catheter, and the left ventricle was entered using a 4-French angled  pigtail catheter.  There were no complications.   FINDINGS:  1. Hemodynamics:  LV 141/19, aorta 140/80.  2. Left ventriculography.  EF was estimated at 55%-60%.  There were no      wall motion abnormalities.  There was no significant mitral      regurgitation.  3. Coronary angiography.  Coronary system was left dominant.  The RCA      was a small vessel without significant coronary disease.  The left      main had no significant disease.  The LAD had 3 moderate-sized      diagonals.  At about the level of the second diagonal, there was      about a 20% narrowing in the LAD.  Left circumflex was a large      dominant artery supplying the left PDA and there was no  significant      coronary disease.   ASSESSMENT/PLAN:  This is a 77 year old with paroxysmal atrial  fibrillation on flecainide who has had episodes of atypical chest pain  and a nondiagnostic Myoview.  Left heart catheterization shows very mild  nonobstructive coronary disease.  I think it would be safe for him to  continue on his flecainide.  We will restart his Coumadin tonight.      Marca Ancona, MD  Electronically Signed    DM/MEDQ  D:  07/04/2009  T:  07/04/2009  Job:  [id number]   cc:   Duke Salvia, MD, [facility]  Francia Greaves

## 2011-05-05 NOTE — Letter (Signed)
April 16, 2008    Dr. Francia Greaves  [address]  [place], [place] [address]   RE:  NICHOLAD, KAUTZMAN  MRN:  [id number]  /  DOB:  Apr 08, 1933   Dear [name]:   It was a pleasure seeing Robert Burch today in followup for his atrial  fibrillation.  He continues to do well.  He has had two brief  recurrences, one in October and one New Year's Eve, both of which lasted  just a number of hours and then terminated spontaneously.  He says he is  feeling as well as he has in 5 years.  He is having no problems with  chest pain or shortness of breath and certainly no exercise intolerance.   His medications include:  1. Flecainide at 60 mg twice daily.  2. Cardizem at 120 a day.  3. Multiple nutraceuticals.  4. Protonix.  5. Flomax.  6. Coumadin.   PHYSICAL EXAMINATION:  VITAL SIGNS:  His blood pressure was mildly  elevated though at 157/80 with a pulse of 56.  His weight was 196 which  is stable.  LUNGS:  Clear.  HEART:  Sounds were regular.  EXTREMITIES:  Without edema.   Electrocardiogram today demonstrated a sinus rhythm at 56 with intervals  of 0.17, 0.11, and 0.42.  The axis was right about 0 degrees.   IMPRESSION:  1. Paroxysmal atrial fibrillation.  2. Hypertension.  3. Coumadin therapy for the above.   Mr. Sciandra was doing quite well, [name].  If there is anything I can do  in the interim, please do not hesitate to contact me.  I will see him  again in 6 months' time.    Sincerely,      Duke Salvia, MD, [facility]  Electronically Signed    SCK/MedQ  DD: 04/16/2008  DT: 04/16/2008  Job #: [id number]

## 2011-05-05 NOTE — Letter (Signed)
September 22, 2007    Cheryl Jefferies,MD  [address]  [place], [place] [address]   RE:  TABER, SWEETSER  MRN:  [id number]  /  DOB:  [birth year]   Dr. [name]:   Mr. Satter comes in today.  He is feeling really well.  He has not had  any atrial fibrillation of which he is aware in the last 14 months or  so.  He continues to exercise briskly without problems with shortness of  breath or chest pain.  He has had no peripheral edema.   MEDICATIONS:  1. Diovan 40.  2. Cardizem 120 which he takes at night.  3. Protonix 40.  4. Flomax.  5. Flecainide 50 b.i.d.  6. Magnesium.   PHYSICAL EXAMINATION:  VITAL SIGNS:  His blood pressure was 15/284.  Looking at his home blood pressures, he runs in the 120 range to the 150  range.  LUNGS:  Clear.  HEART:  Sounds were regular.  EXTREMITIES:  Without edema.   Electrocardiogram dated September 22, 2007, demonstrated a sinus rhythm at  54 with intervals of 0.19, 0.11, 0.42 and the axis was 20 degrees.   IMPRESSION:  1. Paroxysmal atrial fibrillation.  2. Hypertension.  3. Borderline bradycardia.  4. Orthostatic hypotension.   Mr. Dowdell is doing really very well, [name].  I am concerned about his  systolic blood pressure.  He has had problems in the past with  orthostatic lightheadedness which prompted the decrease of his Diovan  from 80-40.  I wonder whether this is going to be tolerable in the long  term given his systolic blood pressures but I asked that he follow with  you in a couple of months.  I will plan to see him again in 6 months'  time.  We will see him in the [facility].  If there is anything I  can do please do not hesitate to contact me.    Sincerely,      Duke Salvia, MD, [facility]  Electronically Signed    SCK/MedQ  DD: 09/22/2007  DT: 09/22/2007  Job #: [id number]

## 2011-05-05 NOTE — Letter (Signed)
October 22, 2008    Francia Greaves, MD  [address]  [place], [place] [address]   RE:  HARRELL, NIEHOFF  MRN:  [id number]  /  DOB:  06-Jul-1933   Dear [name],   Mr. Ambrocio comes in, who is still doing very well with his atrial  fibrillation, has had only one episode since January.   He is currently off of Cardizem.  He says that I told him that was an  okay thing to do.   His only major complaint is orthostatic lightheadedness, particularly  with bending or after exerting himself while standing.   PHYSICAL EXAMINATION:  VITAL SIGNS:  His blood pressure is 150/92.  His  pulse was 56.  LUNGS:  Clear.  HEART:  Sounds were regular.  EXTREMITIES:  Without edema.  SKIN:  Warm and dry.   His medications relates to the above issues include:  1. Diovan 80.  2. Flecainide 50 b.i.d.  3. Cardizem 120 which he has intercurrently stopped on his own, but he      said that I told him he could, Coumadin, Protonix, and Flomax.   [name], I think Mr. Ozburn should be back on his Cardizem.  I am not  quite sure how I confused him to let him think that it was appropriate  to stop that, but in the setting of type 1C antiarrhythmic drug therapy,  some patients can develop atrial flutter which by the nature of the  drugs will be quite slow i.e. 220 beats per minute.  They can then have  a paradoxically increased ventricular response as they then go 1:1 in  their flutters, and so adjunctive therapy with AV nodal blocking agents  is typical with 1C therapy and so that I have asked him to go back on  his Cardizem.  This will also help his blood pressure.   As relates his orthostatic intolerance, his medications may be  contributing to this, but I do not know that there are a lot of other  options.   Thanks very much for allowing [place] to participate in the care of this  patient.  We will see him in about a year.    Sincerely,      Duke Salvia, MD, [facility]  Electronically Signed    SCK/MedQ  DD: 10/22/2008  DT: 10/23/2008  Job #: [id number]

## 2011-05-05 NOTE — Assessment & Plan Note (Signed)
 HEALTHCARE                         ELECTROPHYSIOLOGY OFFICE NOTE   NAME:Robert Burch, Robert Burch                     MRN:          [id number]  DATE:02/18/2009                            DOB:          [birth year]    Robert Burch is seen following a recent overnight hospitalization for  chest pain that was relatively atypical in that it was quite prolonged  lasting 10-12 hours at a time.  It was unassociated with enzymes.  He  was subsequently discharged with increase in his PPIs.  He saw Dr.  Lin Givens and then underwent ultrasound scanning of his abdomen at  [name] and this is apparently unrevealing.   His discomfort persists a little bit, but is gradually abating.  In  addition to his PPI increase, he was also put on a Gas-X type of  compound.   He has had two episodes of atrial fibrillation.  He was seen last in  November.  This is quite satisfactory for him.   MEDICATIONS:  1. Flecainide 50 b.i.d.  2. Cardizem 120.  3. Coumadin.  4. Flomax.  5. Gabapentin 300.  6. Protonix 40 b.i.d.   PHYSICAL EXAMINATION:  VITAL SIGNS:  His blood pressure was mildly  elevated at 142/82, his pulse was 66, his weight was 195 which is  stable.  LUNGS:  Clear.  HEART:  Sounds were regular.  NECK:  Veins were flat.  EXTREMITIES:  Without edema.   IMPRESSION:  1. Atrial fibrillation - paroxysmal, on flecainide.  2. Intercurrent episode of atypical chest pain in the setting of      normal catheterization 3 years ago, unlikely to represent cardiac      pain.   At this point, we will continue on his current course.  I have suggested  to Robert Burch that if he has more atrial fibrillation, we can gradually  uptitrate his flecainide as necessary.   We will plan to see him in 4 months' time.     Duke Salvia, MD, [facility]  Electronically Signed    SCK/MedQ  DD: 02/18/2009  DT: 02/19/2009  Job #: [id number]   cc:   Francia Greaves, MD

## 2011-05-08 NOTE — Cardiovascular Report (Signed)
NAME:  Robert Burch, Robert Burch NO.:  [id number]   MEDICAL RECORD NO.:  [id number]                   PATIENT TYPE:  OIB   LOCATION:  6501                                 FACILITY:  [facility]   PHYSICIAN:  Carole Binning, M.D. [facility]         DATE OF BIRTH:  02-04-1933   DATE OF PROCEDURE:  07/01/2004  DATE OF DISCHARGE:                              CARDIAC CATHETERIZATION   PROCEDURE PERFORMED:  Left heart catheterization with coronary angiography  and left ventriculography.   INDICATION:  Mr. Barbary is a 77 year old male with history of atrial  fibrillation.  He has been having symptoms of chest pain with typical and  atypical components.  He recently underwent a stress Cardiolite study.  This  suggested a small prior apical infarct, but no significant ischemia and wall  motion is normal.  The patient, however, has continued to have symptoms of  chest pain and was thus referred for cardiac catheterization to rule out  coronary artery disease.   CATHETERIZATION PROCEDURAL NOTE:  A 4 French sheath was placed in the right  femoral artery.  Coronary arteriography was performed standard Judkins 4  French catheters.  Left ventriculography was performed with an angled  pigtail catheter.  Contrast was Omnipaque.  There were no complications.   CATHETERIZATION RESULTS:   HEMODYNAMICS:  1. Left ventricular pressure 135/12.  2. Aortic pressure 135/68.  3. There is no aortic valve gradient.   LEFT VENTRICULOGRAM:  Wall motion is normal.  Ejection fraction is estimated  at 60%.  There is no mitral regurgitation.   CORONARY ARTERIOGRAPHY (LEFT DOMINANT):  Left main is normal.   Left anterior descending artery has a tubular 25% stenosis in the mid  vessel.  The LAD is otherwise normal giving rise to small first and second  diagonal branches and a normal size third diagonal branch.   Left circumflex is a dominant vessel.  It gives rise to a small ramus  intermedius and  a large first obtuse marginal branch.  There is a 20%  stenosis in the proximal portion of the first obtuse marginal.  The distal  circumflex gives rise to two normal size posterior lateral branches and a  normal size posterior descending artery.   Right coronary artery is a small nondominant vessel.  The right coronary is  normal.   IMPRESSION:  1. Normal left ventricular systolic function.  2. No significant coronary artery disease identified.   RECOMMENDATIONS:  Continued medical therapy.                                               Carole Binning, M.D. [facility]    MWP/MEDQ  D:  07/01/2004  T:  07/01/2004  Job:  [id number]   cc:   Duke Salvia, M.D.   Dr. Tora Duck  Lin Givens

## 2011-05-08 NOTE — Letter (Signed)
November 25, 2006    Charlene Scott  [address]  [place], [place] [address]   RE:  EDWAR, COE  MRN:  [id number]  /  DOB:  09-11-1933   Dear Westley Hummer:   Mr. Copeman comes in today.  He has been in sinus rhythm and has had no  afibs since August, he is thrilled.  He is feeling better.  He  continues to exercise vigorously.  His blood pressure is better today at  126/62, he is currently taking antibiotics for his prostate, otherwise  he is well.  His lungs were clear, heart sounds were regular and  extremities were without edema.   IMPRESSION:  1. Atrial fibrillation, paroxysmal, on flecainide and Cardizem.  2. Hypertension, currently adequately controlled.  3. Coumadin therapy.   Mr. Brislin is stable.  Will see him again in 4-5 months.  I hope this  letter finds you well, and Merry Christmas.    Sincerely,      Duke Salvia, MD, [facility]  Electronically Signed    SCK/MedQ  DD: 11/25/2006  DT: 11/25/2006  Job #: [id number]

## 2011-05-08 NOTE — Procedures (Signed)
[facility]                                EXERCISE TREADMILL   NAME:Robert Burch, Robert Burch                     MRN:          [id number]  DATE:08/12/2006                            DOB:          1933-08-09    EXERCISE TREADMILL TEST NOTE:   HISTORY:  [name] is a patient followed by Dr. Graciela Husbands with a history of  paroxysmal atrial fibrillation.  He recently saw Dr. Graciela Husbands with increased  frequency of episodes.  The patient was placed on flecainide and presents  today for routine treadmill to rule out ventricular arrhythmias.   EXERCISE TREADMILL TEST:  The patient exercised at Bruce protocol for 6  minutes 58 seconds, achieving a work level of 8.4 mets.  His heart rate went  from 63 beats per minute to a maximum of 112 beats per minute.  This  represents 74% of his maximal expected heart rate.  His blood pressure went  from 127/74 to a maximum of 144/68.  The patient complained of shortness of  breath but no chest pain.  The test was stopped secondary to fatigue.   ELECTROCARDIOGRAM:  Baseline normal sinus rhythm, with a heart rate of 60.  No ischemic changes.  There were no ST or T wave changes to suggest ischemia  or injury.  There were no ventricular arrhythmias noted.   IMPRESSION:  Suboptimal exercise treadmill test.  The patient achieved  maximum exercise without ventricular arrhythmias.   DISPOSITION:  The patient will remain on flecainide and follow up with Dr.  Graciela Husbands as scheduled.                                   Tereso Newcomer, PA-C                                Salvadore Farber, MD   SW/MedQ  DD:  08/12/2006  DT:  08/13/2006  Job #:  [id number]

## 2011-05-08 NOTE — Letter (Signed)
September 01, 2006     Dr. Francia Greaves  [address]  [place], [place] [address]   RE:  Robert Burch, Robert Burch  MRN:  [id number]  /  DOB:  12-05-1933   Dear [name]:   Robert Burch is seen.  He is in sinus rhythm.  He has been on Flecainide now  three weeks, has had no recurrent atrial fibrillation.  He is tolerating the  drug well.  He did have a couple of palpitations, feeling a hard heartbeat,  but these were very limited at the time.  His blood pressure is elevated  today at 154/87.  This is a little bit higher than normal.  Pulse is 51.  Lungs were clear.  Heart sounds were regular.   IMPRESSION:  1. Paroxysmal atrial fibrillation, now on Flecainide.  2. Hypertension.   [name], Robert Burch is doing quite well.  I wonder whether it is not worth  thinking about treating his hypertension, now that his numbers are steadily  in the 140 to 150 range.  If you do, I would think about using an Ace  inhibitor or an ARB observed in some studies that are showing that these the  decrease the frequency of his atrial fibrillation.   I hope this letter finds you well.   Sincerely,     Duke Salvia, MD, [facility]   SCK/MedQ  DD:  08/31/2006  DT:  09/01/2006  Job #:  [id number]

## 2011-05-08 NOTE — Letter (Signed)
April 04, 2007    Cheryl Jeffries,MD  [address]  [place], [place] [address]   RE:  Robert, Burch  MRN:  [id number]  /  DOB:  05/16/1933   Dear [name]:   [name] comes in today.  He is in sinus rhythm and has been  tolerating his flecainide for the last eight months without recurrence  of atrial fibrillation.  He is ecstatic.   His blood pressure was up a little bit and it looks like you started him  back on Diovan.  I applaud your choice.  The ARBs have been shown to  decrease frequencies of atrial fibrillation so it is well worth using  and up-titrating as necessary.   PHYSICAL EXAMINATION:  VITAL SIGNS:  Blood pressure 141/84, pulse 53.  LUNGS:  Clear.  CARDIOVASCULAR:  Regular.  EXTREMITIES:  Without edema.   His electrocardiogram today demonstrated sinus rhythm at 51 with  intervals of 0.17/0.12/0.45.   IMPRESSION:  1. Paroxysmal atrial fibrillation on flecainide.  2. Hypertension.  3. Borderline QR restoration.   [name] is going great.  We will see him again in six months.  Please let me know if there is anything I can do to help.    Sincerely,      Duke Salvia, MD, [facility]  Electronically Signed    SCK/MedQ  DD: 04/04/2007  DT: 04/04/2007  Job #: [id number]

## 2011-05-08 NOTE — Assessment & Plan Note (Signed)
[facility]                           ELECTROPHYSIOLOGY OFFICE NOTE   NAME:Hendrie, AHMAR PICKRELL                     MRN:          [id number]  DATE:08/09/2006                            DOB:          02/01/1933    HISTORY OF PRESENT ILLNESS:  [name] was seen.  He has paroxysmal atrial  fibrillation.  His episodes continue to wax and wane.  He is now having a  couple of episodes a month that are lasting about a day.  He has finally  decided to go on Flecainide after about a year and a half.  We have reviewed  again the proarrhythmic issues as well as the side effect profile.   He also complains of pain in his back.   MEDICATIONS:  Reviewed and are unchanged.   PHYSICAL EXAMINATION:  VITAL SIGNS:  Blood pressure 147/82, pulse 57.  LUNGS:  Clear, but markedly decreased breath sounds on the right compared to  the left.  EXTREMITIES:  Without edema.   STUDIES:  Electrocardiogram demonstrated sinus rhythm at 56 rhythms with  0.16/0.106/0.41.  The axis was 20 degrees.   IMPRESSION:  1. Atrial fibrillation.  2. Decreased breath sounds on the right associated with back pain.   PLAN:  We will plan to check a chest x-ray today.  We will give him the  prescription for the flecainide as noted previously, and will need to  undertake stress testing later this week.                                   Duke Salvia, MD, [facility]   SCK/MedQ  DD:  08/09/2006  DT:  08/09/2006  Job #:  [id number]   cc:   Francia Greaves, M.D.

## 2011-05-08 NOTE — Discharge Summary (Signed)
NAME:  Robert Burch, Robert Burch NO.:  [id number]   MEDICAL RECORD NO.:  [id number]          PATIENT TYPE:  INP   LOCATION:  3707                         FACILITY:  [facility]   PHYSICIAN:  Doylene Canning. Ladona Ridgel, M.D.  DATE OF BIRTH:  May 23, 1933   DATE OF ADMISSION:  06/09/2006  DATE OF DISCHARGE:  06/10/2006                                 DISCHARGE SUMMARY   ALLERGIES:  ALLERGIES ARE TO SULFA.   PRINCIPAL DIAGNOSIS:  1.  Admitted with recurrent chest pain.  The patient is in sinus rhythm on      admission through emergency room.  2.  Nonobstructive coronary artery disease at catheterization July 2005.      Ejection fraction 60%.  3.  Troponin-I study 0.01 then 0.01.  4.  Paroxysm of atrial fibrillation eruption with heart rate in the 120s at      04:35 in the morning of June 21.  The patient reversed  to sinus rhythm      at 11:17 the morning of June 21.  5.  The patient has lightheadedness at the time of termination of his atrial      fibrillation; he can tell onset and offset.  Sometimes he has pauses      with termination but not this morning.  6.  Atrial fibrillation episodes are  paroxysmal; the longest 22.5 hours,      most last 3 to 4 hours, frequency 1 to 2 a week, lately 1 every 2-1/2      weeks.   SECONDARY DIAGNOSES:  1.  Recent prostatitis.  2.  Recent abdominal pain.      1.  Reportedly negative abdominal CT scan.  3.  Hypertension.  4.  Gastroesophageal reflux disease.   PLAN:  1.  The patient's atrial fibrillation is fairly well-controlled on Cardizem.  2.  Plan is for the patient to start flecainide.  3.  The patient will need recent catheterization, which will be done at the      [facility], Monday, June 25 at 9:30 a.m.  4.  He will follow up with office visit, Dr. Graciela Husbands, on June 21, 2006 at      10:45.  5.  He will pick up a 30-day event monitor as he leaves the hospital from      the office of  [facility].   HISTORY OF PRESENT ILLNESS:  Mr.  Robert Burch is a 77 year old male.  He has a  history of nonobstructive coronary artery disease by catheterization July  2005.  He presents to the emergency room with recurrent chest pain  consistent with angina pectoris.  This has been ongoing for the last 3 days.   The patient states that these symptoms are dissimilar from those that he  experienced prior to his catheterization 2 years ago.  At that time, they  were sharp.  At that time, he underwent evaluation with an exercise stress  test.  It was abnormal.  Then he had the catheterization.  The study showed  25% mid LAD stenosis, 20% proximal OM-1 stenosis, normal right coronary  artery, normal  left ventricular function.  The patient also has a history  paroxysmal atrial fibrillation, followed by Dr. Graciela Husbands.  He is on chronic  Coumadin.  His rate is relatively well-controlled with Cardizem 120 mg  daily.  When he has a paroxysmal breakthrough he takes an extra 60 mg of  Cardizem every 6 hours as needed.  There has been discussion about putting  the patient on flecainide.  The patient has been reticent, wishing to clear  up some of his other medical problems prior to starting flecainide therapy.   The patient exercised on a regular basis until the past few days.  He  experienced some mild, dull chest tightness while swimming Monday morning.  He had some pain, which lasted 2 hours, later in the day, sitting and  watching TV.  Yesterday morning, after his usual morning walk, he had some  recurrent chest discomfort again; this lasted several hours.  On the day of  admission, June 20, while driving his car, he had recurrence of the pain,  and he decided to come to the emergency room.  The patient has been treated  with aspirin but not received any nitroglycerin.  He reports mild residual  discomfort.  Electrocardiogram shows no ischemic changes and initial cardiac  markers are negative.  He has recently taken his blood pressure, which was   reportedly 90/65.  He spoke to his primary care physician and stopped taking  Diovan as of yesterday, June 19.   The plan includes admitting the patient to telemetry, ruling out myocardial  infarction with serial cardiac markers.  His Coumadin will be held.  He may  need a repeat catheterization.  The patient will be started on aspirin and  added IV nitroglycerin.  Blood pressure will be followed closely.   HOSPITAL COURSE:  The patient presented in the emergency room on the evening  of June 19 with recurrent chest pain over a 3-day period.  Electrocardiogram  was nondiagnostic for acute myocardial infarction.  All troponin-I studies  were negative, 0.01 then 0.01.  His chest pain went away.  On hospital day  number 2, he had an eruption of his paroxysmal atrial fibrillation.  It  lasted approximately 7 hours.  He was mainly asymptomatic although he could  tell that his heart was racing.  Prior to taking a rescue dose of Cardizem,  however, he converted back to sinus rhythm.  He did not have a post-  termination pause with this.  The patient says he gets lightheaded at times  with atrial fibrillation, especially at termination, and he notes some  pausing with termination at times as well.  He is seen by Dr. Sherryl Manges.  It is known that he has an office with Dr. Graciela Husbands planned for July 2.  At  that time, they were once again going to undertake possibility of starting  flecainide.  To be sure, Dr. Graciela Husbands is ordering a catheterization to be done  Monday, June 25, to rule out ischemic source prior to starting flecainide.  This will be done at the [facility] by Dr. Gala Romney.  His Coumadin will be held  starting yesterday so that his INR will be less than 1.5 at the time he  comes to the [facility].   DISCHARGE MEDICATIONS:  He is discharged on the following medications:  1.  Protonix 40 mg daily.  2.  Zantac 150 mg at night. 3.  His Coumadin dose is 2 mg daily, except for 2.5 mg Wednesday,  but he  is      to hold the Coumadin until Monday June 25.  4.  Cardizem 120 mg daily with 60 mg to be taken for paroxysmal atrial      fibrillation every 6 hours as needed.  5.  Gabapentin 300 mg at night, 100 mg as needed.  6.  Flomax 0.4 mg daily.   Once again, the plan is to pick up an event monitor at [facility] on  the way home June 21.  He will present to [facility]  on the morning  of June 25 for protime, and then to the [facility] at [facility] by  8:30 on June 25.  Once again, he will follow up with Dr. Graciela Husbands July 2 at  10:45.   LABORATORY STUDIES THIS ADMISSION:  Complete blood count:  White cells are  7, hemoglobin 14.1, hematocrit 42.4, and platelets 269.  Serum electrolytes:  Sodium 133, potassium 4, chloride 100, bicarbonate 27,  BUN is 12, creatinine 0.9, glucose 101.  On the day of discharge, PT was  25.3, INR 2.3.  Coumadin continues to be held.  His alkaline phosphatase  this admission is 65, SGOT is 15, SGPT is 15.  Troponin-I studies are 0.01  then 0.01.  TSH is 1.921 this admission.      Maple Mirza, P.A.    ______________________________  Doylene Canning. Ladona Ridgel, M.D.    GM/MEDQ  D:  06/10/2006  T:  06/10/2006  Job:  [id number]   cc:   Duke Salvia, M.D.  [address]  [address]  [place]  [place] [address]   Francia Greaves, MD

## 2011-05-08 NOTE — H&P (Signed)
NAME:  Robert Burch, Robert Burch NO.:  [id number]   MEDICAL RECORD NO.:  [id number]          PATIENT TYPE:  INP   LOCATION:  1826                         FACILITY:  [facility]   PHYSICIAN:  Doylene Canning. Ladona Ridgel, M.D.  DATE OF BIRTH:  23-Mar-1933   DATE OF ADMISSION:  06/09/2006  DATE OF DISCHARGE:                                HISTORY & PHYSICAL   PRIMARY CARDIOLOGIST:  Duke Salvia, M.D.   REASON FOR ADMISSION:  Robert Burch is a 77 year old male with a history of  nonobstructive coronary artery disease by cardiac catheterization, July  possibly 2005, who now presents to the emergency room with complaint of 3-  day history of recurrent angina pectoris.  The patient states that these  symptoms are dissimilar from those which he experienced prior to his cardiac  catheterization 2 years ago, which were typically sharp.  At that time, he  underwent initial evaluation with an exercise stress test which was abnormal  and which then led to the catheterization.  This revealed 25% mid LAD  stenosis and 20% proximal OM-1 stenosis with normal right coronary artery  and normal left ventricular function with no wall motion abnormalities.  The  patient also has a history of paroxysmal atrial fibrillation, followed by  Dr. Sherryl Manges.  He is on chronic Coumadin.  He has not had any recent  exacerbation of his occasional palpitations.  The patient reports exercising  on a regular basis up until these past few days.  He experienced some mild  dull chest tightness while swimming Monday morning and then had some pain  which lasted approximately 2 hours later that day while sitting and watching  TV.  Yesterday morning, after completing his usual morning walk, he had some  recurrent discomfort again lasting several hours in duration.  Today while  driving in his car with his wife, he had recurrent pain and given its  recurrent nature, decided to come to the emergency room.   The patient has been  treated with aspirin but has not yet received any  nitroglycerin.  He currently reports some mild residual discomfort.  However, electrocardiogram shows no ischemic changes and initial cardiac  markers are negative.  Of note, the patient also reports experiencing some  mild lightheadedness while at the pharmacy department at [facility] yesterday.  This did not occur immediately upon standing and did not occur shortly after  having taken his antihypertensive medications.  He took his blood pressure  which was reportedly 90/65 and after speaking to his primary physician,  stopped taking his Diovan as of yesterday.   ALLERGIES:  SULFA.   HOME MEDICATIONS:  1.  Protonix 40 mg q.a.m.  2.  Zantac 150 mg nightly.  3.  Coumadin 2 mg every day, except 2.5 mg every Wednesday.  4.  Cardizem 120 mg every day.  5.  Gabapentin 300 mg nightly and 100 mg p.r.n.  6.  Flomax 0.4 mg every day.   PAST MEDICAL HISTORY:  1.  Nonobstructive coronary artery disease.      1.  Cardiac catheterization, July possibly 2005.  2.  History of paroxysmal atrial fibrillation.      1.  Chronic Coumadin.  3.  Recent prostatitis.  4.  Recent abdominal pain.      1.  Reportedly negative recent abdominal CT scan.  5.  Hypertension.  6.  Gastroesophageal reflux disease.   SOCIAL HISTORY:  The patient lives in [place] with his wife.  They have 3  children.  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  He has never smoked tobacco  and denies alcohol use.   FAMILY HISTORY:  Both parents deceased at a late age, the mother with  congestive heart failure.  There is no known coronary artery disease in his  family.   REVIEW OF SYSTEMS:  Denies any recent exertional dyspnea, orthopnea, or  paroxysmal nocturnal dyspnea, or lower extremity edema.  No recent evidence  of overt bleeding, otherwise as noted per HPI.  Remaining systems are  negative.   PHYSICAL EXAMINATION:  VITAL SIGNS:  Blood pressure 142/81, pulse 69  regular,  respirations 20, sat is 98% on room air.  GENERAL:  A 77 year old male sitting upright in no apparent distress.  HEENT:  Normocephalic atraumatic.  NECK:  Palpable carotid pulses without bruits.  No JVD.  LUNGS:  Clear to auscultation all fields.  HEART:  Regular rate and rhythm (S1 S2).  No murmurs, rubs, or gallops.  ABDOMEN:  Soft, nontender with intact bowel sounds.  No bruits.  EXTREMITIES:  Palpable femoral and distal pulses without edema.  NEUROLOGIC:  No focal deficit.   ADMISSION ELECTROCARDIOGRAM:  Normal sinus rhythm at 66 beats per minute  with normal axis.  No ischemic changes.   LABORATORY DATA:  Troponin I less than 0.05, MB less than 1.0.  Hemoglobin  14, hematocrit 42, WBC 7, platelets 269.  Sodium 135, potassium 4.6, BUN 15,  creatinine 0.9, glucose 104.   IMPRESSION:  Robert Burch is a 77 year old male with a history of  nonobstructive coronary artery disease by previous cardiac catheterization  in July approximately 2005, following a stress Cardiolite study suggestive  of small prior apical infarct with no significant ischemia, who now presents  to the emergency room with complaint of a 3-day history of recurrent angina  pectoris occurring typically at rest.  The patient states that this chest  discomfort is not similar to that which preceded his cardiac  catheterization.  Initial cardiac markers are negative and the  electrocardiogram shows no ischemic changes.   PLAN:  1.  The patient will be admitted to a telemetry unit for close monitoring      and rule out a myocardial infarction with serial cardiac markers.  2.  In anticipation of proceeding with a repeat cardiac catheterization,      Coumadin will be placed on hold.  His INR currently is 2.5.  3.  The patient will be started on aspirin and we will add IV nitroglycerin      to his home regimen.  4.  Blood pressure will be followed closely, given the fact that he was     reporting some low blood pressure  yesterday and has since been taken off      his Diovan.   RECOMMENDATION:  Consider cardiac catheterization if cardiac markers are  abnormal or borderline, otherwise if cardiac markers are negative then  recommendation is to discharge the patient and pursue further workup for  possible GI etiology.      Gene Serpe, P.A. [facility]    ______________________________  Doylene Canning. Ladona Ridgel, M.D.  GS/MEDQ  D:  06/09/2006  T:  06/09/2006  Job:  [id number]   cc:   Jenkins Rouge, Dr.  Nicholes Rough

## 2011-05-08 NOTE — Cardiovascular Report (Signed)
NAME:  Robert Burch, TOTH NO.:  [id number]   MEDICAL RECORD NO.:  [id number]          PATIENT TYPE:  OIB   LOCATION:  1962                         FACILITY:  [facility]   PHYSICIAN:  Arvilla Meres, M.D. LHCDATE OF BIRTH:  12-10-1933   DATE OF PROCEDURE:  06/14/2006  DATE OF DISCHARGE:                              CARDIAC CATHETERIZATION   PRIMARY CARE PHYSICIAN:  Dr. __________ in [place], [place]   CARDIOLOGIST:  Dr. Berton Mount.   PATIENT IDENTIFICATION:  Mr. Dilworth is a very pleasant 77 year old male  with a history of minimal non-obstructive coronary artery disease by  catheterization in 2005 as well as paroxysmal atrial fibrillation.  He was  recently admitted with recurrent chest pain.  He ruled out for myocardial  infarction with serial cardiac markers.  However, he did have an episode of  recurrent atrial fibrillation while in the hospital.  He is being considered  for flecainide therapy.  Given his chest pain and the possibility of  initiating chronic flecainide therapy, he was scheduled for cardiac  catheterization in the outpatient laboratory to clearly assess his coronary  anatomy.  The procedure was done in the outpatient catheterization  laboratory.   PROCEDURES PERFORMED:  1.  Selective coronary angiography.  2.  Left heart catheterization.  3.  Left ventriculogram.   DESCRIPTION OF PROCEDURE:  The risks and benefits of the catheterization  were explained.  Consent was signed and placed on the chart.  A 4-French  arterial sheath was placed in the right femoral artery.  Standard catheters  including a JL4, 3-D RC, and angled pigtail were used for the procedure.  All catheter exchanges made over a wire.  There were no apparent  complications.   Central aortic pressure was 145/71 with a mean of 99.  LV pressure was 149/8  with an EDP of 18.  There was no aortic stenosis.   Left main had a 20% ostial stenosis.   LAD was a long vessel  coursing to the apex.  It gave off a moderate sized  first diagonal, a small second diagonal, and a large third diagonal.  There  was a 30% lesion in the mid LAD.   Left circumflex was a large dominant system.  It gave off a small ramus, a  large branching OM, two moderate sized posterolaterals, a small  posterolateral, and a moderate sized PDA.  There was a 20% stenosis in the  proximal portion of the OM.   Right coronary artery was a non-dominant vessel which gave off an acute  marginal and an RV branch.  There was no significant coronary disease.   Left ventriculogram done in the RAO position showed an EF of approximately  55%, though this was hard to pain due to significant ectopy.  There was no  apparent mitral regurgitation.   ASSESSMENT:  1.  Minimal non-obstructive coronary artery disease without significant      change from 2005.  2.  Normal left ventricular function.   PLAN/DISCUSSION:  The etiology of his chest pain remains unclear to me.  Given his coronary anatomy, it probably is  not ischemic in nature.  We will  proceed with risk factor modification and it appears that flecainide will  probably be safe based on his coronary anatomy and normal LV function.  Will  leave this to the discretion of Dr. Graciela Husbands.      Arvilla Meres, M.D. [facility]  Electronically Signed     DB/MEDQ  D:  06/14/2006  T:  06/14/2006  Job:  [id number]   cc:   Duke Salvia, M.D.  [address]  [address]  [place]  [place] [address]   ?Cheryl ?Lin Givens, M.D.  [place], [place]

## 2011-05-17 ENCOUNTER — Observation Stay: Payer: Self-pay | Admitting: Internal Medicine

## 2011-05-17 DIAGNOSIS — I369 Nonrheumatic tricuspid valve disorder, unspecified: Secondary | ICD-10-CM

## 2011-05-18 DIAGNOSIS — I4891 Unspecified atrial fibrillation: Secondary | ICD-10-CM

## 2011-05-18 DIAGNOSIS — R55 Syncope and collapse: Secondary | ICD-10-CM

## 2011-05-25 ENCOUNTER — Ambulatory Visit (INDEPENDENT_AMBULATORY_CARE_PROVIDER_SITE_OTHER): Payer: Medicare Other | Admitting: Internal Medicine

## 2011-05-25 ENCOUNTER — Encounter: Payer: Self-pay | Admitting: Internal Medicine

## 2011-05-25 VITALS — BP 132/80 | HR 57 | Ht 70.0 in | Wt 194.1 lb

## 2011-05-25 DIAGNOSIS — I4891 Unspecified atrial fibrillation: Secondary | ICD-10-CM

## 2011-05-25 DIAGNOSIS — R42 Dizziness and giddiness: Secondary | ICD-10-CM

## 2011-05-25 MED ORDER — FLECAINIDE ACETATE 100 MG PO TABS: 100.0000 mg | ORAL_TABLET | Freq: Two times a day (BID) | ORAL | Status: DC

## 2011-05-25 NOTE — Progress Notes (Signed)
HPI  Robert Burch is a 77 y.o. male Seen in followup for paroxysmal atrial fibrillation in the setting of nonischemic coronary disease by recent catheterization and modest hypertension. He is to continue to have problems with some orthostatic lightheadedness. This is aggravated in the context of his atrial fibrillation and his taking extra Cardizem at the same time.  He had an episode of atrial fibrillation recently which when it terminated he became briefly unresponsive. He was sitting on the sofa his eyes rolled back and his wife thought he was having a seizure.  More recently i.e. Last week he was admitted to [facility] following a syncopal episode in the context of atrial fibrillation. He was taking extra Cardizem before he went to bed. He took some middle of the night. He got up in the morning to take some more and while in the bathroom prior to having voided he collapsed to the floor. He was taken to the hospital where he was found to be significantly orthostatic. He reverted to sinus rhythm spontaneously. Echo cardiogram at that time demonstrated normal left ventricular function left atrial dimension was 4.4.    Other blood work was within normal limits. Including a TSH  Past Medical History  Diagnosis Date  . Coronary artery disease   . Paroxysmal atrial fibrillation   . GERD (gastroesophageal reflux disease)   . IBS (irritable bowel syndrome)   . Diverticulosis   . Hypertension     Past Surgical History  Procedure Date  . Cardiac catheterization     Current Outpatient Prescriptions  Medication Sig Dispense Refill  . ALPRAZolam (XANAX) 0.25 MG tablet Take 0.25 mg by mouth at bedtime as needed.        . Cholecalciferol (VITAMIN D3) 400 UNITS CAPS Take 1 capsule by mouth daily.        Marland Kitchen co-enzyme Q-10 30 MG capsule Take 30 mg by mouth daily.        . Coenzyme Q10 50 MG CAPS Take 1 capsule by mouth daily.        Marland Kitchen diltiazem (DILACOR XR) 120 MG 24 hr capsule Take  120 mg by mouth daily.        . Flaxseed, Linseed, (FLAX SEED OIL) 1000 MG CAPS Take 1 capsule by mouth daily.        . flecainide (TAMBOCOR) 50 MG tablet Take 50 mg by mouth 2 (two) times daily.        Marland Kitchen gabapentin (NEURONTIN) 300 MG capsule Take 100 mg by mouth at bedtime.       . Magnesium 250 MG TABS Take 1 tablet by mouth daily.        . Multiple Vitamin (MULTIVITAMIN) tablet Take 1 tablet by mouth daily.        . Omega-3 Fatty Acids (FISH OIL) 1000 MG CAPS Take 1 capsule by mouth daily.        . pantoprazole (PROTONIX) 40 MG tablet Take 40 mg by mouth every other day.       . ranitidine (ZANTAC) 150 MG tablet Take 150 mg by mouth every other day.        . Tamsulosin HCl (FLOMAX) 0.4 MG CAPS Take 0.4 mg by mouth daily.        Marland Kitchen warfarin (COUMADIN) 2.5 MG tablet Take 2.5 mg by mouth daily.        Marland Kitchen DISCONTD: valsartan (DIOVAN) 40 MG tablet Take 40 mg by mouth daily.  Allergies  Allergen Reactions  . Sulfonamide Derivatives     Review of Systems negative except from HPI and PMH  Physical Exam Well developed and well nourished in no acute distress HENT normal; her stitches by his left eye E scleral and icterus clear Neck Supple JVP flat; carotids brisk and full Clear to ausculation Regular rate and rhythm, no murmurs gallops or rub Soft with active bowel sounds No clubbing cyanosis and edema Alert and oriented, grossly normal motor and sensory function Skin Warm and Dry  ECG sinus rhythm at 57 Intervals 0.18/20 126.43 Axis is 30  Assessment and  Plan

## 2011-05-25 NOTE — Assessment & Plan Note (Signed)
He has had an increase in the frequency of his atrial fibrillation. We will plan to increase his flecainide from 50-100 mg twice daily. Further we will refer him to Dr. Johney Frame for consideration of catheter ablation. He will continue his warfarin

## 2011-05-25 NOTE — Assessment & Plan Note (Signed)
The patient had syncope and probably suitable to orthostatic hypotension. This occurs chronically and is aggravated by his atrial fibrillation and his concomitant increase in his calcium channel blockers. Hence we will discontinue the p.r.n. Calcium blocker. We have also reviewed with him potential maneuvers to try to mitigate some of the orthostatic dizziness.

## 2011-05-25 NOTE — Patient Instructions (Signed)
Your physician has recommended you make the following change in your medication: Increase Flecainide to 100mg  twice daily.  You have been scheduled to see Dr. Johney Frame for an evaluation for an a-fib ablation for 07/01/11 @ 3:00pm at our [place] location.

## 2011-06-01 ENCOUNTER — Telehealth: Payer: Self-pay

## 2011-06-01 NOTE — Telephone Encounter (Signed)
Patient calling with joint pain, burning sensation down his arms and the back of the knee with tingling in hands.  He takes the pm dose of Flecanide at 7:00 pm and wakes up about 4 am with those symptoms.  The Flecanide was 50 mg twice a day changed to 100 mg twice a day.  He has felt these symptoms for one week.  He states symptoms started about one day or so after the Flecanide increased.  Please call with what to do?   Please call back [phone] cell [phone].

## 2011-06-02 NOTE — Telephone Encounter (Signed)
Spoke with pt after discussing with Dr. Graciela Husbands, and advised pt to decr Flecainide to 50mg  bid and see if symptoms improve, although this is very atypical of this med. Also, pt does take Gabapentin for "nerve ending problems." Pt states that he takes this occasionally, notified pt that this may be related to his nerve issue. Pt agrees, and will hold Flecainide and pt has f/u appt next Monday 6/18 with Dr. Graciela Husbands, and will report results then.

## 2011-06-08 ENCOUNTER — Ambulatory Visit (INDEPENDENT_AMBULATORY_CARE_PROVIDER_SITE_OTHER): Payer: Medicare Other | Admitting: Internal Medicine

## 2011-06-08 ENCOUNTER — Telehealth: Payer: Self-pay

## 2011-06-08 ENCOUNTER — Encounter: Payer: Self-pay | Admitting: Internal Medicine

## 2011-06-08 VITALS — BP 118/68 | HR 60 | Ht 71.0 in | Wt 195.4 lb

## 2011-06-08 DIAGNOSIS — I4891 Unspecified atrial fibrillation: Secondary | ICD-10-CM

## 2011-06-08 MED ORDER — DILTIAZEM HCL 30 MG PO TABS: 30.0000 mg | ORAL_TABLET | Freq: Three times a day (TID) | ORAL | Status: DC

## 2011-06-08 MED ORDER — FLECAINIDE ACETATE 50 MG PO TABS: 75.0000 mg | ORAL_TABLET | Freq: Two times a day (BID) | ORAL | Status: DC

## 2011-06-08 NOTE — Progress Notes (Signed)
HPI  Robert Burch is a 77 y.o. male Seen in followup for paroxysmal atrial fibrillation in the setting of nonischemic coronary disease by recent catheterization and modest hypertension. He is to continue to have problems with some orthostatic lightheadedness. This is aggravated in the context of his atrial fibrillation and his taking extra Cardizem at the same time.  He had an episode of atrial fibrillation recently which when it terminated he became briefly unresponsive. He was sitting on the sofa his eyes rolled back and his wife thought he was having a seizure.  More recently i.e. Last week he was admitted to [facility] following a syncopal episode in the context of atrial fibrillation. He was taking extra Cardizem before he went to bed. He took some middle of the night. He got up in the morning to take some more and while in the bathroom prior to having voided he collapsed to the floor. He was taken to the hospital where he was found to be significantly orthostatic. He reverted to sinus rhythm spontaneously. Echo cardiogram at that time demonstrated normal left ventricular function left atrial dimension was 4.4.  Since that time we tried to increase his flecainide. This was associated with paresthesias and dizziness. He adjusted his medications on his own and is currently taking 50 mg in the morning and 100 mg at night this associated with decreased paresthesias.  Lightheadedness has recurred. It stopped with the discontinuation of his Diovan. He notes that when he goes to the gym in the morning.  Past Medical History  Diagnosis Date  . Coronary artery disease   . Paroxysmal atrial fibrillation   . GERD (gastroesophageal reflux disease)   . IBS (irritable bowel syndrome)   . Diverticulosis   . Hypertension     Past Surgical History  Procedure Date  . Cardiac catheterization     Current Outpatient Prescriptions  Medication Sig Dispense Refill  . ALPRAZolam (XANAX) 0.25  MG tablet Take 0.25 mg by mouth at bedtime as needed.        . Cholecalciferol (VITAMIN D3) 400 UNITS CAPS Take 1 capsule by mouth daily.        Marland Kitchen co-enzyme Q-10 30 MG capsule Take 30 mg by mouth daily.        . Coenzyme Q10 50 MG CAPS Take 1 capsule by mouth daily.        Marland Kitchen diltiazem (DILACOR XR) 120 MG 24 hr capsule Take 120 mg by mouth daily.        . Flaxseed, Linseed, (FLAX SEED OIL) 1000 MG CAPS Take 1 capsule by mouth daily.        . flecainide (TAMBOCOR) 100 MG tablet Take 1 tablet (100 mg total) by mouth 2 (two) times daily.  60 tablet  6  . gabapentin (NEURONTIN) 300 MG capsule Take 100 mg by mouth at bedtime.       . Magnesium 250 MG TABS Take 1 tablet by mouth daily.        . Multiple Vitamin (MULTIVITAMIN) tablet Take 1 tablet by mouth daily.        . Omega-3 Fatty Acids (FISH OIL) 1000 MG CAPS Take 1 capsule by mouth daily.        . pantoprazole (PROTONIX) 40 MG tablet Take 40 mg by mouth every other day.       . ranitidine (ZANTAC) 150 MG tablet Take 150 mg by mouth every other day.        . Tamsulosin HCl (FLOMAX) 0.4  MG CAPS Take 0.4 mg by mouth daily.        Marland Kitchen warfarin (COUMADIN) 2.5 MG tablet Take 2.5 mg by mouth daily.          Allergies  Allergen Reactions  . Sulfonamide Derivatives     Review of Systems negative except from HPI and PMH  Physical Exam Well developed and well nourished in no acute distress HENT normal E scleral and icterus clear Neck Supple JVP flat; carotids brisk and full Clear to ausculation Regular rate and rhythm, no murmurs gallops or rub Soft with active bowel sounds No clubbing cyanosis and edema Alert and oriented, grossly normal motor and sensory function Skin Warm and Dry    Assessment and  Plan

## 2011-06-08 NOTE — Assessment & Plan Note (Signed)
He continues to have paroxysms of atrial fibrillation. He is scheduled to see Dr. Johney Frame in July for consideration of pulmonary vein isolation. In the interim we will not change drugs

## 2011-06-08 NOTE — Telephone Encounter (Signed)
Patient would like medications sent to [facility] [address] not [facility].

## 2011-06-08 NOTE — Assessment & Plan Note (Signed)
This continues to be a major issue. We'll decrease his Cardizem from 120 mg a day to a total of 90 mg a day 30 mg 3 times daily. Will also plan to change his flecainide time from before exercise after exercise and see how this does.

## 2011-06-08 NOTE — Patient Instructions (Signed)
Your physician has recommended you make the following change in your medication: DECREASE Diltiazem to 30mg  THREE times a day. DECREASE Flecainide to 50 mg 1 1/2 tablet (for 75mg ) TWO times daily.  Your physician recommends that you schedule a follow-up appointment in: 3 months with Dr. Graciela Husbands

## 2011-06-08 NOTE — Assessment & Plan Note (Signed)
Adequately controlled 

## 2011-07-01 ENCOUNTER — Ambulatory Visit (INDEPENDENT_AMBULATORY_CARE_PROVIDER_SITE_OTHER): Payer: Medicare Other | Admitting: Internal Medicine

## 2011-07-01 ENCOUNTER — Encounter: Payer: Self-pay | Admitting: Internal Medicine

## 2011-07-01 DIAGNOSIS — R0683 Snoring: Secondary | ICD-10-CM

## 2011-07-01 DIAGNOSIS — R0609 Other forms of dyspnea: Secondary | ICD-10-CM

## 2011-07-01 DIAGNOSIS — I10 Essential (primary) hypertension: Secondary | ICD-10-CM

## 2011-07-01 DIAGNOSIS — I4891 Unspecified atrial fibrillation: Secondary | ICD-10-CM

## 2011-07-01 NOTE — Progress Notes (Signed)
Robert Burch is a pleasant 77 y.o. WM patient with a h/o paroxysmal atrial fibrillation who presents today for consideration of catheter ablation.  He reports intially being diagnosed with afib 1990s.  He reports that episodes were quite rare in frequency at that time.  His afib increased in frequency and duration and he was subsequently placed on flecainide 50mg  BID several years ago.  He reports good control of afib initially with flecainide.  Unfortunately, his afib has begun to break through flecainide.  He has been unable to tolerate flecainide 100mg  BID and therefore has been placed on flecainide 75mg  BID.  He is unaware of any triggers or precipitants for his afib.  He reports that episodes typically last 12-14 hours, though recently episodes have lasted 24 hours.  He reports symptoms of palpitations and dizziness during afib.  He did pass out upon rising from a supine position to go to the bathroom Memorial Day weekend and reports that he was in afib at that time.     Today, he denies symptoms of chest pain, shortness of breath, orthopnea, PND, lower extremity edema, presyncope, any further syncope, or neurologic sequela. The patient is tolerating medications without difficulties and is otherwise without complaint today.   Past Medical History  Diagnosis Date  . Coronary artery disease     very minor irregularities by cath 7/10  . Paroxysmal atrial fibrillation   . GERD (gastroesophageal reflux disease)   . IBS (irritable bowel syndrome)   . Diverticulosis   . Hypertension   . Nonischemic cardiomyopathy     EF 45% by echo 2010   Past Surgical History  Procedure Date  . Cardiac catheterization     Current Outpatient Prescriptions  Medication Sig Dispense Refill  . ALPRAZolam (XANAX) 0.25 MG tablet Take 0.25 mg by mouth at bedtime as needed.        . Cholecalciferol (VITAMIN D3) 400 UNITS CAPS Take 1 capsule by mouth daily.        Marland Kitchen co-enzyme Q-10 30 MG capsule Take 30 mg by mouth  daily.        . Coenzyme Q10 50 MG CAPS Take 1 capsule by mouth daily.        Marland Kitchen diltiazem (CARDIZEM) 30 MG tablet Take 1 tablet (30 mg total) by mouth 3 (three) times daily.  90 tablet  6  . Flaxseed, Linseed, (FLAX SEED OIL) 1000 MG CAPS Take 1 capsule by mouth daily.        . flecainide (TAMBOCOR) 50 MG tablet Take 1.5 tablets (75 mg total) by mouth 2 (two) times daily.  75 tablet  6  . gabapentin (NEURONTIN) 300 MG capsule Take 100 mg by mouth at bedtime.       . Magnesium 250 MG TABS Take 1 tablet by mouth daily.        . Multiple Vitamin (MULTIVITAMIN) tablet Take 1 tablet by mouth daily.        . Omega-3 Fatty Acids (FISH OIL) 1000 MG CAPS Take 1 capsule by mouth daily.        . pantoprazole (PROTONIX) 40 MG tablet Take 40 mg by mouth every other day.       . ranitidine (ZANTAC) 150 MG tablet Take 150 mg by mouth every other day.        . Tamsulosin HCl (FLOMAX) 0.4 MG CAPS Take 0.4 mg by mouth daily.        Marland Kitchen warfarin (COUMADIN) 2.5 MG tablet Take 2.5 mg by mouth  daily.          Allergies  Allergen Reactions  . Sulfonamide Derivatives     History   Social History  . Marital Status: Married    Spouse Name: N/A    Number of Children: N/A  . Years of Education: N/A   Occupational History  . Not on file.   Social History Main Topics  . Smoking status: Never Smoker   . Smokeless tobacco: Never Used  . Alcohol Use: No  . Drug Use: No  . Sexually Active:    Other Topics Concern  . Not on file   Social History Narrative  . No narrative on file    No family history on file.  ROS- All systems are reviewed and negative except as per the HPI above, per his spouse, he snores.  Physical Exam: Filed Vitals:   07/01/11 1458  BP: 144/88  Pulse: 64  Height: 5\' 11"  (1.803 m)  Weight: 194 lb (87.998 kg)    GEN- The patient is well appearing, alert and oriented x 3 today.   Head- normocephalic, atraumatic Eyes-  Sclera clear, conjunctiva pink Ears- hearing  intact Oropharynx- clear Neck- supple, no JVP Lymph- no cervical lymphadenopathy Lungs- Clear to ausculation bilaterally, normal work of breathing Heart- Regular rate and rhythm, no murmurs, rubs or gallops, PMI not laterally displaced GI- soft, NT, ND, + BS Extremities- no clubbing, cyanosis, or edema MS- no significant deformity or atrophy Skin- no rash or lesion Psych- euthymic mood, full affect Neuro- strength and sensation are intact  EKG today reveals sinus rhythm 63 bpm, PR 194, QRS 110, Qtc 429, otherwise normal ekg  Assessment and Plan:

## 2011-07-01 NOTE — Patient Instructions (Signed)
Will call us back if wants to proceed with ablation  Your physician has recommended that you have a sleep study. This test records several body functions during sleep, including: brain activity, eye movement, oxygen and carbon dioxide blood levels, heart rate and rhythm, breathing rate and rhythm, the flow of air through your mouth and nose, snoring, body muscle movements, and chest and belly movement.

## 2011-07-02 ENCOUNTER — Encounter: Payer: Self-pay | Admitting: Internal Medicine

## 2011-07-02 DIAGNOSIS — R0683 Snoring: Secondary | ICD-10-CM | POA: Insufficient documentation

## 2011-07-02 DIAGNOSIS — R069 Unspecified abnormalities of breathing: Secondary | ICD-10-CM | POA: Insufficient documentation

## 2011-07-02 NOTE — Assessment & Plan Note (Signed)
Sleep study ordered

## 2011-07-02 NOTE — Assessment & Plan Note (Addendum)
Robert Burch is a pleasant 77yo WM with paroxysmal atrial fibrillation.  He reports increasing frequency and duration of atrial fibrillation despite medical therapy with flecainide and cardizem.  I believe that he would be a very good candidate for catheter ablation. Therapeutic strategies for afib including medicine and ablation were discussed in detail with the patient today. Risk, benefits, and alternatives to EP study and radiofrequency ablation for afib were also discussed in detail today.  At this time, the patient wishes to further contemplate his options.  He wishes to further consult with Dr Graciela Husbands and will contact my office if he wishes to proceed.  No changes were made today. As his spouse reports that he snores, we will arrange for a sleep study to evaluate for sleep apnea as a contributing factor to his afib.

## 2011-07-02 NOTE — Assessment & Plan Note (Signed)
Stable No change required today  

## 2011-07-07 ENCOUNTER — Encounter: Payer: Self-pay | Admitting: *Deleted

## 2011-07-15 ENCOUNTER — Ambulatory Visit (HOSPITAL_BASED_OUTPATIENT_CLINIC_OR_DEPARTMENT_OTHER): Payer: Medicare Other | Attending: Internal Medicine

## 2011-07-15 DIAGNOSIS — I4891 Unspecified atrial fibrillation: Secondary | ICD-10-CM

## 2011-07-15 DIAGNOSIS — R0683 Snoring: Secondary | ICD-10-CM

## 2011-07-15 DIAGNOSIS — G4710 Hypersomnia, unspecified: Secondary | ICD-10-CM | POA: Insufficient documentation

## 2011-07-22 ENCOUNTER — Ambulatory Visit: Payer: Self-pay | Admitting: Podiatry

## 2011-07-23 ENCOUNTER — Encounter: Payer: Self-pay | Admitting: Internal Medicine

## 2011-07-23 DIAGNOSIS — G4710 Hypersomnia, unspecified: Secondary | ICD-10-CM

## 2011-07-23 DIAGNOSIS — G4730 Sleep apnea, unspecified: Secondary | ICD-10-CM

## 2011-07-23 NOTE — Procedures (Signed)
NAME:  Robert Burch, Robert Burch NO.:  [id number]  MEDICAL RECORD NO.:  [id number]          PATIENT TYPE:  OUT  LOCATION:  SLEEP CENTER                 FACILITY:  [facility]  PHYSICIAN:  Barbaraann Share, MD,FCCPDATE OF BIRTH:  22-Oct-1935  DATE OF STUDY:  07/15/2011                           NOCTURNAL POLYSOMNOGRAM  REFERRING PHYSICIAN:  Hillis Range, MD  INDICATION FOR STUDY:  Hypersomnia with sleep apnea.  EPWORTH SLEEPINESS SCORE:  5.  MEDICATIONS:  SLEEP ARCHITECTURE:  The patient had total sleep time of 229 minutes with no slow wave sleep and only 75 minutes of REM.  Sleep onset latency was prolonged at 72 minutes, and REM onset was at the upper limits of normal.  Sleep efficiency was poor at 54%.  RESPIRATORY DATA:  The patient was found to have 10 apneas and 1 obstructive hypopnea, giving him an apnea/hypopnea index of only 3 events per hour.  The events were more common during REM, but did occur an all body positions.  Mild snoring was noted throughout.  The patient did not meet split night protocol secondary to the small numbers of events.  OXYGEN DATA:  There was O2 desaturation as low as 91% with the patient's obstructive events.  CARDIAC DATA:  Rare premature ventricular contraction noted, but no clinically significant arrhythmias were seen.  MOVEMENT-PARASOMNIA:  The patient had no leg jerks or other abnormal behaviors noted.  IMPRESSIONS-RECOMMENDATIONS: 1. Small numbers of obstructive events, which do not meet the AHI     criteria for the obstructive sleep apnea syndrome.  The patient did     have a total sleep time of only 229 minutes; however, did achieve     REM and had     ample opportunity to exhibit clinically significant sleep     disordered breathing. 2. Rare premature ventricular contraction noted, but no clinically     significant arrhythmias were seen.     Barbaraann Share, MD,FCCP Diplomate, American Board of  Sleep Medicine Electronically Signed    [facility]  D:  07/23/2011 13:29:17  T:  07/23/2011 22:46:56  Job:  [id number]

## 2011-07-27 ENCOUNTER — Telehealth: Payer: Self-pay

## 2011-07-27 MED ORDER — FLECAINIDE ACETATE 50 MG PO TABS: 75.0000 mg | ORAL_TABLET | Freq: Two times a day (BID) | ORAL | Status: DC

## 2011-07-27 NOTE — Telephone Encounter (Signed)
[facility] a [facility] sent [facility] [address].

## 2011-07-30 ENCOUNTER — Telehealth: Payer: Self-pay | Admitting: *Deleted

## 2011-07-30 ENCOUNTER — Other Ambulatory Visit: Payer: Self-pay | Admitting: *Deleted

## 2011-07-30 MED ORDER — DILTIAZEM HCL 30 MG PO TABS: 30.0000 mg | ORAL_TABLET | Freq: Three times a day (TID) | ORAL | Status: DC

## 2011-07-30 NOTE — Telephone Encounter (Signed)
Pt called wanting to make sure he was safe to take Prednisone with his current a fib medications ( flecainide and diltiazem). Spoke with Dr. Lorenz Coaster as well prior to calling pt back, notified pt that he also agrees that this should not be a problem. However, if he does feel any side effects or changes he can call our office. Pt ok with this.

## 2011-08-05 ENCOUNTER — Telehealth: Payer: Self-pay | Admitting: Internal Medicine

## 2011-08-05 NOTE — Telephone Encounter (Signed)
Returning call back to nurse.  

## 2011-08-06 NOTE — Telephone Encounter (Signed)
Patient calling back to speak with nurse.

## 2011-08-11 ENCOUNTER — Encounter: Payer: Self-pay | Admitting: Internal Medicine

## 2011-08-11 ENCOUNTER — Ambulatory Visit (INDEPENDENT_AMBULATORY_CARE_PROVIDER_SITE_OTHER): Payer: Medicare Other | Admitting: Internal Medicine

## 2011-08-11 VITALS — BP 119/74 | HR 66 | Ht 71.0 in | Wt 189.0 lb

## 2011-08-11 DIAGNOSIS — I4891 Unspecified atrial fibrillation: Secondary | ICD-10-CM

## 2011-08-11 DIAGNOSIS — I10 Essential (primary) hypertension: Secondary | ICD-10-CM

## 2011-08-11 NOTE — Progress Notes (Signed)
HPI  Robert Burch is a 77 y.o. male Seen in followup for paroxysmal atrial fibrillation in the setting of nonischemic coronary disease by recent catheterization and modest hypertension. He is to continue to have problems with some orthostatic lightheadedness. This is aggravated in the context of his atrial fibrillation and his taking extra Cardizem at the same time. He continues to have episodes of atrial fibrillation with symptoms of LH/syncope, SOB and palpitations despite increasing doses of flecanide which have been associated with paresthesias and dizziness  He was referred to Dr Tommie Ard for consideration of pulm vein isolation He had an episode of atrial fibrillation recently which when it terminated he became briefly unresponsive. He was sitting on the sofa his eyes rolled back and his wife thought he was having a seizure.  Echo cardiogram at that time demonstrated normal left ventricular function left atrial dimension was 4.4..  Lightheadedness is improved  Current new issues include Fe def anemia of unknown cause and back problems Past Medical History  Diagnosis Date  . GERD (gastroesophageal reflux disease)   . IBS (irritable bowel syndrome)   . Diverticulosis   . Hypertension   . Nonischemic cardiomyopathy     EF 45% by echo 2010  . Coronary artery disease     Nonobstructive; very minor irregularities by cath 7/10 repeat 2010 also essentially normal  . Atypical chest pain 2/10, 6-7/10    ETT-myoview done 7/10, pt exercised 10 minutes, 11.7 METS but only reached 75% MPHR; stopped due to fatigue; no ECG changes; appeared to be small area of apical lateral ischemia; EF calculated at 49% but appeared better isually; confirmatory echo showed normal LV systolic function  . Paroxysmal atrial fibrillation     Treated with flecainide  . Drug therapy     Chronic Coumadin    Past Surgical History  Procedure Date  . Cardiac catheterization     Current Outpatient  Prescriptions  Medication Sig Dispense Refill  . ALPRAZolam (XANAX) 0.25 MG tablet Take 0.25 mg by mouth at bedtime as needed.        . Cholecalciferol (VITAMIN D3) 400 UNITS CAPS Take 1 capsule by mouth daily.        . ciprofloxacin (CIPRO) 500 MG tablet Take by mouth as directed. For inflammation of prostate.       Marland Kitchen co-enzyme Q-10 30 MG capsule Take 30 mg by mouth daily.        . Coenzyme Q10 50 MG CAPS Take 1 capsule by mouth daily.        Marland Kitchen diltiazem (CARDIZEM) 30 MG tablet Take 1 tablet (30 mg total) by mouth 3 (three) times daily.  180 tablet  3  . Ferrous Sulfate (SLOW FE PO) Take by mouth every other day.        . Flaxseed, Linseed, (FLAX SEED OIL) 1000 MG CAPS Take 1 capsule by mouth daily.        . flecainide (TAMBOCOR) 50 MG tablet Take 1.5 tablets (75 mg total) by mouth 2 (two) times daily.  135 tablet  3  . gabapentin (NEURONTIN) 300 MG capsule Take 100 mg by mouth at bedtime.       . Magnesium 250 MG TABS Take 1 tablet by mouth daily.        . Multiple Vitamin (MULTIVITAMIN) tablet Take 1 tablet by mouth daily.        . Omega-3 Fatty Acids (FISH OIL) 1000 MG CAPS Take 1 capsule by mouth daily.        Marland Kitchen  pantoprazole (PROTONIX) 40 MG tablet Take 40 mg by mouth every other day.       . ranitidine (ZANTAC) 150 MG tablet Take 150 mg by mouth every other day.        . Tamsulosin HCl (FLOMAX) 0.4 MG CAPS Take 0.4 mg by mouth daily.        . valsartan (DIOVAN) 40 MG tablet Take 40 mg by mouth daily.        Marland Kitchen warfarin (COUMADIN) 2.5 MG tablet Take 2.5 mg by mouth daily.          Allergies  Allergen Reactions  . Sulfonamide Derivatives     Review of Systems negative except from HPI and PMH  Physical Exam Well developed and well nourished in no acute distress HENT normal E scleral and icterus clear Neck Supple JVP flat; carotids brisk and full Clear to ausculation Regular rate and rhythm, no murmurs gallops or rub Soft with active bowel sounds No clubbing cyanosis and  edema Alert and oriented, grossly normal motor and sensory function Skin Warm and Dry  ecg sinus 66  .18/.11/.40 O/w normal  Assessment and  Plan

## 2011-08-11 NOTE — Assessment & Plan Note (Signed)
Doing well 

## 2011-08-11 NOTE — Assessment & Plan Note (Signed)
We spent 25 min revieewing questions related to possible Pul vein isolation He will continue to consider but also wants back and anemia issues resolved

## 2011-08-11 NOTE — Patient Instructions (Signed)
You are doing well. No medication changes were made. Please call us if you have new issues that need to be addressed before your next appt.  Your physician recommends that you schedule a follow-up appointment in: 3 months

## 2011-08-13 ENCOUNTER — Ambulatory Visit: Payer: Self-pay | Admitting: Unknown Physician Specialty

## 2011-08-17 ENCOUNTER — Telehealth: Payer: Self-pay | Admitting: Internal Medicine

## 2011-08-17 NOTE — Telephone Encounter (Signed)
Spoke with patient and sent results to Dr Francia Greaves in [place]

## 2011-08-17 NOTE — Telephone Encounter (Signed)
Pt calling for results of sleep study....

## 2011-09-10 ENCOUNTER — Ambulatory Visit: Payer: Medicare Other | Admitting: Internal Medicine

## 2011-11-02 ENCOUNTER — Ambulatory Visit (INDEPENDENT_AMBULATORY_CARE_PROVIDER_SITE_OTHER): Payer: Medicare Other | Admitting: Cardiovascular Disease

## 2011-11-02 ENCOUNTER — Encounter: Payer: Self-pay | Admitting: Cardiovascular Disease

## 2011-11-02 DIAGNOSIS — I4891 Unspecified atrial fibrillation: Secondary | ICD-10-CM

## 2011-11-02 DIAGNOSIS — R079 Chest pain, unspecified: Secondary | ICD-10-CM

## 2011-11-02 DIAGNOSIS — I10 Essential (primary) hypertension: Secondary | ICD-10-CM

## 2011-11-02 DIAGNOSIS — R42 Dizziness and giddiness: Secondary | ICD-10-CM

## 2011-11-02 NOTE — Assessment & Plan Note (Signed)
Blood pressure is well controlled on today's visit. No changes made to the medications. 

## 2011-11-02 NOTE — Assessment & Plan Note (Signed)
Chest pain is at rest, very nonspecific/atypical. He has had similar chest pain in the past after heavy exertion. I'm concerned that he may have hurt himself following his fall 3 weeks ago. Given his recent catheterization showing no significant coronary artery disease, no workup has been planned. I suggested he try Tylenol arthritis.

## 2011-11-02 NOTE — Patient Instructions (Signed)
You are doing well. No medication changes were made.  Please call us if you have new issues that need to be addressed before your next appt.  The office will contact you for a follow up Appt. In 6 months  

## 2011-11-02 NOTE — Progress Notes (Signed)
Patient ID: Robert Burch, male    DOB: Jun 25, 1933, 77 y.o.   MRN: [id number]  HPI Comments: 77 yo with history of paroxysmal atrial fibrillation, h/o chest pain, catheterization in 2007 showing nonobstructive CAD, Presenting for routine followup.  He denies any tachy palpitations concerning for atrial fibrillation. He does report that he slipped and fell, hurting his ribs 3 weeks ago. He was very sore across his chest for one week. He has had a dull chest ache in the central mediastinum radiating both left and right that comes on at rest, rarely with exertion. It is in the subxiphoid region, sometimes extending to the right. He has not tried any medications such as NSAIDs as he is on warfarin.  He does have a chronic issue with sweats that seem to come on, unprovoked. Etiology is uncertain. It is not clear how much Xanax he is taking though he does report having a long history of anxiety.  Over the summer, he reported that he did pinching nerve in his back and required a cortisone shot for inflammation. He is a nonsmoker       Outpatient Encounter Prescriptions as of 11/02/2011  Medication Sig Dispense Refill  . ALPRAZolam (XANAX) 0.25 MG tablet Take 0.25 mg by mouth. Three times a week      . Cholecalciferol (VITAMIN D3) 400 UNITS CAPS Take 1 capsule by mouth daily.        Marland Kitchen co-enzyme Q-10 30 MG capsule Take 30 mg by mouth daily.        . Coenzyme Q10 50 MG CAPS Take 1 capsule by mouth daily.        Marland Kitchen diltiazem (CARDIZEM) 30 MG tablet Take 1 tablet (30 mg total) by mouth 3 (three) times daily.  180 tablet  3  . Flaxseed, Linseed, (FLAX SEED OIL) 1000 MG CAPS Take 1 capsule by mouth daily.        . flecainide (TAMBOCOR) 50 MG tablet Take 1.5 tablets (75 mg total) by mouth 2 (two) times daily.  135 tablet  3  . gabapentin (NEURONTIN) 300 MG capsule Take 100 mg by mouth at bedtime.       . Magnesium 250 MG TABS Take 375 mg by mouth daily.       . Multiple Vitamin (MULTIVITAMIN) tablet  Take 1 tablet by mouth daily.        . Omega-3 Fatty Acids (FISH OIL) 1000 MG CAPS Take 1 capsule by mouth daily.        . pantoprazole (PROTONIX) 40 MG tablet Take 40 mg by mouth. Four times a week      . ranitidine (ZANTAC) 150 MG tablet Take 150 mg by mouth every other day.        . Tamsulosin HCl (FLOMAX) 0.4 MG CAPS Take 0.4 mg by mouth daily.        . valsartan (DIOVAN) 40 MG tablet Take 40 mg by mouth daily.        Marland Kitchen warfarin (COUMADIN) 2.5 MG tablet Take 2.5 mg by mouth daily.        Marland Kitchen DISCONTD: ciprofloxacin (CIPRO) 500 MG tablet Take by mouth as directed. For inflammation of prostate.       Marland Kitchen DISCONTD: Ferrous Sulfate (SLOW FE PO) Take by mouth every other day.          Review of Systems  Constitutional: Negative.   HENT: Negative.   Eyes: Negative.   Respiratory: Negative.   Cardiovascular: Positive for chest pain.  Gastrointestinal: Negative.   Musculoskeletal: Positive for myalgias.  Skin: Negative.   Neurological: Negative.        Sweats  Hematological: Negative.   Psychiatric/Behavioral: Negative.   All other systems reviewed and are negative.    BP 122/78  Pulse 56  Ht 5\' 10"  (1.778 m)  Wt 194 lb (87.998 kg)  BMI 27.84 kg/m2   Physical Exam  Nursing note and vitals reviewed. Constitutional: He is oriented to person, place, and time. He appears well-developed and well-nourished.  HENT:  Head: Normocephalic.  Nose: Nose normal.  Mouth/Throat: Oropharynx is clear and moist.  Eyes: Conjunctivae are normal. Pupils are equal, round, and reactive to light.  Neck: Normal range of motion. Neck supple. No JVD present.  Cardiovascular: Normal rate, regular rhythm, S1 normal, S2 normal, normal heart sounds and intact distal pulses.  Exam reveals no gallop and no friction rub.   No murmur heard. Pulmonary/Chest: Effort normal and breath sounds normal. No respiratory distress. He has no wheezes. He has no rales. He exhibits no tenderness.  Abdominal: Soft. Bowel  sounds are normal. He exhibits no distension. There is no tenderness.  Musculoskeletal: Normal range of motion. He exhibits no edema and no tenderness.  Lymphadenopathy:    He has no cervical adenopathy.  Neurological: He is alert and oriented to person, place, and time. Coordination normal.  Skin: Skin is warm and dry. No rash noted. No erythema.  Psychiatric: He has a normal mood and affect. His behavior is normal. Judgment and thought content normal.           Assessment and Plan

## 2011-11-02 NOTE — Assessment & Plan Note (Signed)
Orthostasis and dizziness has improved on lower dose diltiazem. We have not made any medication changes.

## 2011-11-02 NOTE — Assessment & Plan Note (Signed)
Maintaining normal sinus rhythm on today's visit and for his symptoms. No medication changes made.

## 2011-11-05 ENCOUNTER — Encounter: Payer: Self-pay | Admitting: Internal Medicine

## 2011-11-10 ENCOUNTER — Ambulatory Visit: Payer: Self-pay | Admitting: Gastroenterology

## 2011-11-11 ENCOUNTER — Telehealth: Payer: Self-pay | Admitting: Internal Medicine

## 2011-11-11 NOTE — Telephone Encounter (Signed)
Toni Amend called back to say that they are checking with Mr Esquibel's pcp who is managing Mr Greenleaf's coumadin and will call back if they still need Cardiology's input.

## 2011-11-11 NOTE — Telephone Encounter (Signed)
Pt scheduled for an EGD on Monday with possible polyp removal.  They want to know if it is ok for Robert Burch to stop his coumadin today until after EGD.

## 2011-11-11 NOTE — Telephone Encounter (Signed)
Dr Bluford Kaufmann wants to know If ok for pt to go off Coumadin starting today until Monday, ask for [facility]

## 2011-11-16 ENCOUNTER — Ambulatory Visit: Payer: Self-pay | Admitting: Gastroenterology

## 2011-11-17 ENCOUNTER — Ambulatory Visit: Payer: Medicare Other | Admitting: Internal Medicine

## 2011-11-24 ENCOUNTER — Encounter: Payer: Self-pay | Admitting: Cardiovascular Disease

## 2011-11-26 ENCOUNTER — Ambulatory Visit (INDEPENDENT_AMBULATORY_CARE_PROVIDER_SITE_OTHER): Payer: Medicare Other | Admitting: Cardiovascular Disease

## 2011-11-26 ENCOUNTER — Encounter: Payer: Self-pay | Admitting: Internal Medicine

## 2011-11-26 ENCOUNTER — Encounter: Payer: Self-pay | Admitting: Cardiovascular Disease

## 2011-11-26 DIAGNOSIS — R079 Chest pain, unspecified: Secondary | ICD-10-CM

## 2011-11-26 DIAGNOSIS — I4891 Unspecified atrial fibrillation: Secondary | ICD-10-CM

## 2011-11-26 DIAGNOSIS — I10 Essential (primary) hypertension: Secondary | ICD-10-CM

## 2011-11-26 NOTE — Patient Instructions (Signed)
You are doing well. No medication changes were made.  Please call us if you have new issues that need to be addressed before your next appt.  The office will contact you for a follow up Appt. In 6 months  

## 2011-11-26 NOTE — Assessment & Plan Note (Signed)
No medication changes made. No further episodes of atrial fibrillation as far as he knows or have been documented.

## 2011-11-26 NOTE — Progress Notes (Signed)
Patient ID: Robert Burch, male    DOB: 1933/09/04, 77 y.o.   MRN: [id number]  HPI Comments: 77 yo with history of paroxysmal atrial fibrillation, h/o chest pain, catheterization in 2007 showing nonobstructive CAD, Presenting for routine followup. We are seeing him today as he was unable to see Dr. Graciela Husbands who is out of town at his previously scheduled appointment and he was concerned about atrial fibrillation. He was recently seen several weeks ago after a fall where he hurt his ribs. He was having chest pain at that time. He was a dull ache in his central mediastinal area radiating bilaterally at rest, rarely with exertion.  Since that time, he has had significant GI workup for this discomfort, most of which has been negative. This included an EGD, abdominal ultrasound. He has not been taking NSAIDs as he is on warfarin. He does take Tylenol and gabapentin.  Currently he is on ciprofloxacin and is concerned about the side effects and possible QTC prolongation and arrhythmia. QTC today is within normal limits. He is no symptoms of palpitations  He denies any tachy palpitations concerning for atrial fibrillation.  He does have a chronic issue with sweats that seem to come on, unprovoked. Etiology is uncertain. It is not clear how much Xanax he is taking though he does report having a long history of anxiety.  EKG shows normal sinus rhythm with rate 60 beats per minute with no significant ST or T wave changes Recent blood work shows her cholesterol 162 LDL 100, HDL 40      Outpatient Encounter Prescriptions as of 11/26/2011  Medication Sig Dispense Refill  . ALPRAZolam (XANAX) 0.25 MG tablet Take 0.25 mg by mouth. Three times a week      . Cholecalciferol (VITAMIN D3) 400 UNITS CAPS Take 1 capsule by mouth daily.        Marland Kitchen co-enzyme Q-10 30 MG capsule Take 30 mg by mouth daily.        . Coenzyme Q10 50 MG CAPS Take 1 capsule by mouth daily.        Marland Kitchen diltiazem (CARDIZEM) 30 MG tablet  Take 1 tablet (30 mg total) by mouth 3 (three) times daily.  180 tablet  3  . Flaxseed, Linseed, (FLAX SEED OIL) 1000 MG CAPS Take 1 capsule by mouth daily.        . flecainide (TAMBOCOR) 50 MG tablet Take 1.5 tablets (75 mg total) by mouth 2 (two) times daily.  135 tablet  3  . gabapentin (NEURONTIN) 300 MG capsule Take 100 mg by mouth at bedtime.       . Magnesium 250 MG TABS Take 375 mg by mouth daily.       . Multiple Vitamin (MULTIVITAMIN) tablet Take 1 tablet by mouth daily.        . Omega-3 Fatty Acids (FISH OIL) 1000 MG CAPS Take 1 capsule by mouth daily.        . pantoprazole (PROTONIX) 40 MG tablet Take 40 mg by mouth.       . ranitidine (ZANTAC) 150 MG tablet Take 150 mg by mouth every other day.        . Tamsulosin HCl (FLOMAX) 0.4 MG CAPS Take 0.4 mg by mouth daily.        . valsartan (DIOVAN) 40 MG tablet Take 40 mg by mouth daily.        Marland Kitchen warfarin (COUMADIN) 2.5 MG tablet Take 2.5 mg by mouth daily.  Review of Systems  Constitutional: Negative.   HENT: Negative.   Eyes: Negative.   Respiratory: Negative.   Cardiovascular: Positive for chest pain.  Gastrointestinal: Negative.   Musculoskeletal: Positive for myalgias.  Skin: Negative.   Neurological: Negative.        Sweats  Hematological: Negative.   Psychiatric/Behavioral: Negative.   All other systems reviewed and are negative.    BP 110/70  Pulse 60  Ht 5\' 10"  (1.778 m)  Wt 197 lb (89.359 kg)  BMI 28.27 kg/m2   Physical Exam  Nursing note and vitals reviewed. Constitutional: He is oriented to person, place, and time. He appears well-developed and well-nourished.  HENT:  Head: Normocephalic.  Nose: Nose normal.  Mouth/Throat: Oropharynx is clear and moist.  Eyes: Conjunctivae are normal. Pupils are equal, round, and reactive to light.  Neck: Normal range of motion. Neck supple. No JVD present.  Cardiovascular: Normal rate, regular rhythm, S1 normal, S2 normal, normal heart sounds and intact  distal pulses.  Exam reveals no gallop and no friction rub.   No murmur heard. Pulmonary/Chest: Effort normal and breath sounds normal. No respiratory distress. He has no wheezes. He has no rales. He exhibits no tenderness.  Abdominal: Soft. Bowel sounds are normal. He exhibits no distension. There is no tenderness.  Musculoskeletal: Normal range of motion. He exhibits no edema and no tenderness.  Lymphadenopathy:    He has no cervical adenopathy.  Neurological: He is alert and oriented to person, place, and time. Coordination normal.  Skin: Skin is warm and dry. No rash noted. No erythema.  Psychiatric: He has a normal mood and affect. His behavior is normal. Judgment and thought content normal.           Assessment and Plan

## 2011-11-26 NOTE — Assessment & Plan Note (Signed)
Blood pressure is well controlled on today's visit. No changes made to the medications. 

## 2011-11-26 NOTE — Assessment & Plan Note (Signed)
As per his previous visit, chest pain is likely noncardiac and probably from his fall several weeks ago and residual muscular ligamental injury. We have recommended continued rest, physical therapy, rare use of NSAIDs.

## 2011-12-07 ENCOUNTER — Encounter: Payer: Self-pay | Admitting: Internal Medicine

## 2011-12-07 ENCOUNTER — Ambulatory Visit (INDEPENDENT_AMBULATORY_CARE_PROVIDER_SITE_OTHER): Payer: Medicare Other | Admitting: Internal Medicine

## 2011-12-07 DIAGNOSIS — R079 Chest pain, unspecified: Secondary | ICD-10-CM

## 2011-12-07 DIAGNOSIS — I10 Essential (primary) hypertension: Secondary | ICD-10-CM

## 2011-12-07 DIAGNOSIS — R55 Syncope and collapse: Secondary | ICD-10-CM

## 2011-12-07 DIAGNOSIS — I4891 Unspecified atrial fibrillation: Secondary | ICD-10-CM

## 2011-12-07 NOTE — Assessment & Plan Note (Signed)
conitnue current meds 

## 2011-12-07 NOTE — Assessment & Plan Note (Signed)
No recurrences as above

## 2011-12-07 NOTE — Assessment & Plan Note (Signed)
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

## 2011-12-07 NOTE — Assessment & Plan Note (Signed)
Continues to imporve although it gradually

## 2011-12-07 NOTE — Progress Notes (Signed)
HPI  Robert Burch is a 77 y.o. male Seen in followup for paroxysmal atrial fibrillation in the setting of nonischemic coronary disease by recent catheterization and modest hypertension. His atrial fib is largely quiet currently with last episode in JUne He was referred to Dr Tommie Ard for consideration of pulm vein isolation;  He is not interested in ablation at this time He has seen Dr Knute Neu a couple of times over the last month b/c of atypical chest pain, unrelated to exertion improved with heat.  He has been evaluated by Gi intercurrently and found to have some gastritis Echo cardiogram at that time demonstrated normal left ventricular function left atrial dimension was 4.4.  Past Medical History  Diagnosis Date  . GERD (gastroesophageal reflux disease)   . IBS (irritable bowel syndrome)   . Diverticulosis   . Hypertension   . Nonischemic cardiomyopathy     EF 45% by echo 2010  . Coronary artery disease     Nonobstructive; very minor irregularities by cath 7/10 repeat 2010 also essentially normal  . Atypical chest pain 2/10, 6-7/10    ETT-myoview done 7/10, pt exercised 10 minutes, 11.7 METS but only reached 75% MPHR; stopped due to fatigue; no ECG changes; appeared to be small area of apical lateral ischemia; EF calculated at 49% but appeared better isually; confirmatory echo showed normal LV systolic function  . Paroxysmal atrial fibrillation     Treated with flecainide  . Drug therapy     Chronic Coumadin    Past Surgical History  Procedure Date  . Cardiac catheterization     Current Outpatient Prescriptions  Medication Sig Dispense Refill  . ALPRAZolam (XANAX) 0.25 MG tablet Take 0.25 mg by mouth. Three times a week      . Cholecalciferol (VITAMIN D3) 400 UNITS CAPS Take 1 capsule by mouth daily.        Marland Kitchen co-enzyme Q-10 30 MG capsule Take 30 mg by mouth daily.        . Coenzyme Q10 50 MG CAPS Take 1 capsule by mouth daily.        Marland Kitchen diltiazem (CARDIZEM) 30 MG tablet Take 1  tablet (30 mg total) by mouth 3 (three) times daily.  180 tablet  3  . Flaxseed, Linseed, (FLAX SEED OIL) 1000 MG CAPS Take 1 capsule by mouth daily.        . flecainide (TAMBOCOR) 50 MG tablet Take 1.5 tablets (75 mg total) by mouth 2 (two) times daily.  135 tablet  3  . gabapentin (NEURONTIN) 300 MG capsule Take 100 mg by mouth at bedtime.       . Magnesium 250 MG TABS Take 375 mg by mouth daily.       . Multiple Vitamin (MULTIVITAMIN) tablet Take 1 tablet by mouth daily.        . Omega-3 Fatty Acids (FISH OIL) 1000 MG CAPS Take 1 capsule by mouth daily.        . pantoprazole (PROTONIX) 40 MG tablet Take 40 mg by mouth.       . ranitidine (ZANTAC) 150 MG tablet Take 150 mg by mouth every other day.        . Tamsulosin HCl (FLOMAX) 0.4 MG CAPS Take 0.4 mg by mouth daily.        . valsartan (DIOVAN) 40 MG tablet Take 40 mg by mouth daily.        Marland Kitchen warfarin (COUMADIN) 2.5 MG tablet Take 2.5 mg by mouth daily.  Allergies  Allergen Reactions  . Sulfonamide Derivatives     Rash     Review of Systems negative except from HPI and PMH  Physical Exam Well developed and well nourished in no acute distress HENT normal E scleral and icterus clear Neck Supple JVP flat; carotids brisk and full Clear to ausculation Regular rate and rhythm, no murmurs gallops or rub Soft with active bowel sounds No clubbing cyanosis none Edema Alert and oriented, grossly normal motor and sensory function Skin Warm and Dry   Assessment and  Plan

## 2011-12-07 NOTE — Patient Instructions (Signed)
Your physician recommends that you schedule a follow-up appointment in: 4-5 mo f/u with Dr. Graciela Husbands

## 2011-12-22 HISTORY — PX: MASS BIOPSY: SHX5445

## 2012-01-03 ENCOUNTER — Other Ambulatory Visit: Payer: Self-pay | Admitting: Internal Medicine

## 2012-04-07 ENCOUNTER — Encounter: Payer: Self-pay | Admitting: Internal Medicine

## 2012-04-07 ENCOUNTER — Ambulatory Visit (INDEPENDENT_AMBULATORY_CARE_PROVIDER_SITE_OTHER): Payer: Medicare Other | Admitting: Internal Medicine

## 2012-04-07 VITALS — BP 138/84 | HR 55 | Ht 71.0 in | Wt 203.5 lb

## 2012-04-07 DIAGNOSIS — I10 Essential (primary) hypertension: Secondary | ICD-10-CM

## 2012-04-07 DIAGNOSIS — I4891 Unspecified atrial fibrillation: Secondary | ICD-10-CM

## 2012-04-07 NOTE — Assessment & Plan Note (Signed)
He will keep an eye on his blood pressure as we decrease his Cardizem

## 2012-04-07 NOTE — Assessment & Plan Note (Signed)
Atrial fibrillation currently stable. He appropriately on anticoagulation. We'll decrease his Cardizem from 3 times a day to 2 times a day.

## 2012-04-07 NOTE — Progress Notes (Signed)
HPI  Robert Burch is a 78 y.o. male  Seen in followup for paroxysmal atrial fibrillation in the setting of nonischemic coronary disease by recent catheterization and modest hypertension. His atrial fib is largely quiet currently with last episode in JUne 2012 He was referred to Dr Tommie Ard for consideration of pulm vein isolation;  He is not interested in ablation at this time  Echo cardiogram at that time demonstrated normal left ventricular function left atrial dimension was 4.4.  He has had no intercurrent atrial fibrillation  Past Medical History  Diagnosis Date  . GERD (gastroesophageal reflux disease)   . IBS (irritable bowel syndrome)   . Diverticulosis   . Hypertension   . Nonischemic cardiomyopathy     EF 45% by echo 2010  . Coronary artery disease     Nonobstructive; very minor irregularities by cath 7/10 repeat 2010 also essentially normal  . Atypical chest pain 2/10, 6-7/10    ETT-myoview done 7/10, pt exercised 10 minutes, 11.7 METS but only reached 75% MPHR; stopped due to fatigue; no ECG changes; appeared to be small area of apical lateral ischemia; EF calculated at 49% but appeared better isually; confirmatory echo showed normal LV systolic function  . Paroxysmal atrial fibrillation     Treated with flecainide  . Drug therapy     Chronic Coumadin    Past Surgical History  Procedure Date  . Cardiac catheterization   . Mass biopsy 2013    Current Outpatient Prescriptions  Medication Sig Dispense Refill  . ALPRAZolam (XANAX) 0.25 MG tablet Take 0.25 mg by mouth. Three times a week      . Cholecalciferol (VITAMIN D3) 400 UNITS CAPS Take 1 capsule by mouth daily.        . ciprofloxacin (CIPRO) 500 MG/5ML (10%) suspension Take 500 mg by mouth 2 (two) times daily.      Marland Kitchen co-enzyme Q-10 30 MG capsule Take 30 mg by mouth daily.        . Coenzyme Q10 50 MG CAPS Take 1 capsule by mouth daily.        Marland Kitchen diltiazem (CARDIZEM) 30 MG tablet Take 1 tablet (30 mg total) by  mouth 3 (three) times daily.  180 tablet  3  . Flaxseed, Linseed, (FLAX SEED OIL) 1000 MG CAPS Take 1 capsule by mouth daily.        . flecainide (TAMBOCOR) 50 MG tablet TAKE 1 & 1/2 TABLETS (75 MG TOTAL) BY MOUTH 2 TIMES DAILY.  270 tablet  1  . gabapentin (NEURONTIN) 300 MG capsule Take 100 mg by mouth at bedtime.       . Magnesium 250 MG TABS Take 375 mg by mouth daily.       . Multiple Vitamin (MULTIVITAMIN) tablet Take 1 tablet by mouth daily.        . Omega-3 Fatty Acids (FISH OIL) 1000 MG CAPS Take 1 capsule by mouth daily.        . pantoprazole (PROTONIX) 40 MG tablet Take 40 mg by mouth.       . ranitidine (ZANTAC) 150 MG tablet Take 150 mg by mouth daily.      . Tamsulosin HCl (FLOMAX) 0.4 MG CAPS Take 0.4 mg by mouth daily.        . valsartan (DIOVAN) 40 MG tablet Take 40 mg by mouth daily.        Marland Kitchen warfarin (COUMADIN) 2.5 MG tablet Take 2.5 mg by mouth daily.  Allergies  Allergen Reactions  . Sulfonamide Derivatives     Rash     Review of Systems negative except from HPI and PMH  Physical Exam BP 138/84  Pulse 55  Ht 5\' 11"  (1.803 m)  Wt 203 lb 8 oz (92.307 kg)  BMI 28.38 kg/m2 Well developed and well nourished in no acute distress HENT normal E scleral and icterus clear Neck Supple JVP flat; carotids brisk and full Clear to ausculation Slow but Regular rate and rhythm, no murmurs gallops or rub Soft with active bowel sounds No clubbing cyanosis none Edema Alert and oriented, grossly normal motor and sensory function Skin Warm and Dry Sinus rhythm at 55 Intervals 0.20/12/43 Inferior Q wave  Assessment and  Plan

## 2012-04-07 NOTE — Patient Instructions (Signed)
6 month follow up with Dr Graciela Husbands.

## 2012-04-07 NOTE — Progress Notes (Signed)
Addended by: Tarri Fuller on: 04/07/2012 03:49 PM   Modules accepted: Orders

## 2012-05-19 ENCOUNTER — Encounter: Payer: Self-pay | Admitting: Cardiovascular Disease

## 2012-05-19 ENCOUNTER — Ambulatory Visit (INDEPENDENT_AMBULATORY_CARE_PROVIDER_SITE_OTHER): Payer: Medicare Other | Admitting: Cardiovascular Disease

## 2012-05-19 VITALS — BP 97/61 | HR 73 | Ht 71.0 in | Wt 198.0 lb

## 2012-05-19 DIAGNOSIS — I4891 Unspecified atrial fibrillation: Secondary | ICD-10-CM

## 2012-05-19 DIAGNOSIS — R0789 Other chest pain: Secondary | ICD-10-CM

## 2012-05-19 DIAGNOSIS — R42 Dizziness and giddiness: Secondary | ICD-10-CM

## 2012-05-19 DIAGNOSIS — R079 Chest pain, unspecified: Secondary | ICD-10-CM

## 2012-05-19 DIAGNOSIS — I10 Essential (primary) hypertension: Secondary | ICD-10-CM

## 2012-05-19 NOTE — Progress Notes (Signed)
HPI  This is a 78 year old gentleman who is a patient of Dr. Graciela Husbands. He requested an urgent evaluation due to some minor episodes of chest discomfort as well as dizziness. He has a history of paroxysmal atrial fibrillation in the setting of nonischemic coronary disease by cardiac catheterization in 2010 and modest hypertension.  His atrial fib has been largely quiet currently with last episode in JUne 2012 He was referred to Dr Tommie Ard for consideration of pulm vein isolation; He is not interested in ablation at this time.  Most recent Echocardiogram was in 2000 and then which demonstrated normal left ventricular function left atrial dimension was 4.4.  He recently started having tingling sensation in both arms which she became concerned about. He is known to have history of neuropathy and takes gabapentin. He had few episodes of chest tightness when he became anxious. He has been able to do his regular exercise without any reported exertional symptoms. During his last visit, the dose of diltiazem was decreased to 30 mg twice daily. He has noticed episodes of dizziness mostly when he stands up. Due to that he stopped taking Diovan and start monitoring his blood pressure regularly. His systolic blood pressure is remaining below 140.   Allergies  Allergen Reactions  . Sulfonamide Derivatives     Rash      Current Outpatient Prescriptions on File Prior to Visit  Medication Sig Dispense Refill  . ALPRAZolam (XANAX) 0.25 MG tablet Take 0.25 mg by mouth. Three times a week      . Cholecalciferol (VITAMIN D3) 400 UNITS CAPS Take 1 capsule by mouth daily.        Marland Kitchen co-enzyme Q-10 30 MG capsule Take 30 mg by mouth daily.        . Coenzyme Q10 50 MG CAPS Take 1 capsule by mouth daily.        . Flaxseed, Linseed, (FLAX SEED OIL) 1000 MG CAPS Take 1 capsule by mouth daily.        . flecainide (TAMBOCOR) 50 MG tablet TAKE 1 & 1/2 TABLETS (75 MG TOTAL) BY MOUTH 2 TIMES DAILY.  270 tablet  1  .  GABAPENTIN, PHN, PO Take 200 mg by mouth 2 (two) times daily.      . Multiple Vitamin (MULTIVITAMIN) tablet Take 1 tablet by mouth daily.        . Omega-3 Fatty Acids (FISH OIL) 1000 MG CAPS Take 1 capsule by mouth daily.        . pantoprazole (PROTONIX) 40 MG tablet Take 40 mg by mouth.       . ranitidine (ZANTAC) 150 MG tablet Take 150 mg by mouth daily.      . Tamsulosin HCl (FLOMAX) 0.4 MG CAPS Take 0.4 mg by mouth daily.        Marland Kitchen warfarin (COUMADIN) 2.5 MG tablet Take 2.5 mg by mouth daily.       Marland Kitchen DISCONTD: diltiazem (CARDIZEM) 30 MG tablet Take 1 tablet (30 mg total) by mouth 3 (three) times daily.  180 tablet  3     Past Medical History  Diagnosis Date  . GERD (gastroesophageal reflux disease)   . IBS (irritable bowel syndrome)   . Diverticulosis   . Hypertension   . Nonischemic cardiomyopathy     EF 45% by echo 2010  . Coronary artery disease     Nonobstructive; very minor irregularities by cath 7/10 repeat 2010 also essentially normal  . Atypical chest pain 2/10, 6-7/10  ETT-myoview done 7/10, pt exercised 10 minutes, 11.7 METS but only reached 75% MPHR; stopped due to fatigue; no ECG changes; appeared to be small area of apical lateral ischemia; EF calculated at 49% but appeared better isually; confirmatory echo showed normal LV systolic function  . Paroxysmal atrial fibrillation     Treated with flecainide  . Drug therapy     Chronic Coumadin     Past Surgical History  Procedure Date  . Cardiac catheterization   . Mass biopsy 2013     History reviewed. No pertinent family history.   History   Social History  . Marital Status: Married    Spouse Name: N/A    Number of Children: 3  . Years of Education: N/A   Occupational History  . 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 Smoking status: Never Smoker   . Smokeless tobacco: Never Used  . Alcohol Use: 1.1 oz/week    1 Drinks containing 0.5 oz of alcohol, 1 Glasses of wine per  week     occassionally  . Drug Use: No  . Sexually Active:    Other Topics Concern  . Not on file   Social History Narrative   Pt lives in [place] with his wife.  Retired Statistician for [employer]      PHYSICAL EXAM   BP 97/61  Pulse 73  Ht 5\' 11"  (1.803 m)  Wt 198 lb (89.812 kg)  BMI 27.62 kg/m2  Constitutional: He is oriented to person, place, and time. He appears well-developed and well-nourished. No distress.  HENT: No nasal discharge.  Head: Normocephalic and atraumatic.  Eyes: Pupils are equal and round. Right eye exhibits no discharge. Left eye exhibits no discharge.  Neck: Normal range of motion. Neck supple. No JVD present. No thyromegaly present.  Cardiovascular: Normal rate, regular rhythm, normal heart sounds and. Exam reveals no gallop and no friction rub. No murmur heard.  Pulmonary/Chest: Effort normal and breath sounds normal. No stridor. No respiratory distress. He has no wheezes. He has no rales. He exhibits no tenderness.  Abdominal: Soft. Bowel sounds are normal. He exhibits no distension. There is no tenderness. There is no rebound and no guarding.  Musculoskeletal: Normal range of motion. He exhibits no edema and no tenderness.  Neurological: He is alert and oriented to person, place, and time. Coordination normal.  Skin: Skin is warm and dry. No rash noted. He is not diaphoretic. No erythema. No pallor.  Psychiatric: He has a normal mood and affect. His behavior is normal. Judgment and thought content normal.      EKG: Sinus  Rhythm  WITHIN NORMAL LIMITS   ASSESSMENT AND PLAN

## 2012-05-19 NOTE — Assessment & Plan Note (Signed)
His chest pain is very atypical and nonexertional. It appears that there is a component of anxiety. He had cardiac catheterization twice in 2010 which showed no significant coronary artery disease. His ECG does not show any acute changes. I asked him to notify us if his symptoms worsen.

## 2012-05-19 NOTE — Assessment & Plan Note (Signed)
He is orthostatic today. He stopped taking Diovan due to that. I asked him to continue to monitor his blood pressure. I advised him to stay well-hydrated and avoid sudden standing up.

## 2012-05-19 NOTE — Assessment & Plan Note (Signed)
He is in normal sinus rhythm. He has not had any recent palpitations to suggest breakthrough atrial fibrillation. Due to his current symptoms and reported dizziness, I will get an echocardiogram to reevaluate his LV systolic function. He has not had any evaluation or tach since 2010.

## 2012-05-19 NOTE — Patient Instructions (Signed)
Your physician has requested that you have an echocardiogram. Echocardiography is a painless test that uses sound waves to create images of your heart. It provides your doctor with information about the size and shape of your heart and how well your heart's chambers and valves are working. This procedure takes approximately one hour. There are no restrictions for this procedure.  Follow up with Dr. Graciela Husbands in 3 months.

## 2012-05-30 ENCOUNTER — Other Ambulatory Visit: Payer: Self-pay

## 2012-05-30 ENCOUNTER — Other Ambulatory Visit (INDEPENDENT_AMBULATORY_CARE_PROVIDER_SITE_OTHER): Payer: Medicare Other

## 2012-05-30 DIAGNOSIS — R072 Precordial pain: Secondary | ICD-10-CM

## 2012-05-30 DIAGNOSIS — I4891 Unspecified atrial fibrillation: Secondary | ICD-10-CM

## 2012-05-30 DIAGNOSIS — R0789 Other chest pain: Secondary | ICD-10-CM

## 2012-05-31 ENCOUNTER — Ambulatory Visit: Payer: Self-pay | Admitting: Physical Medicine and Rehabilitation

## 2012-06-08 ENCOUNTER — Ambulatory Visit: Payer: Self-pay | Admitting: Otolaryngology

## 2012-06-21 ENCOUNTER — Encounter: Payer: Self-pay | Admitting: Internal Medicine

## 2012-06-21 ENCOUNTER — Ambulatory Visit (INDEPENDENT_AMBULATORY_CARE_PROVIDER_SITE_OTHER): Payer: Medicare Other | Admitting: Internal Medicine

## 2012-06-21 VITALS — BP 120/80 | HR 97 | Ht 70.0 in | Wt 198.2 lb

## 2012-06-21 DIAGNOSIS — I4891 Unspecified atrial fibrillation: Secondary | ICD-10-CM

## 2012-06-21 DIAGNOSIS — R42 Dizziness and giddiness: Secondary | ICD-10-CM

## 2012-06-21 DIAGNOSIS — I10 Essential (primary) hypertension: Secondary | ICD-10-CM

## 2012-06-21 NOTE — Assessment & Plan Note (Signed)
The patient's atrial fibrillation is quiet. At the last visit we supplemented with magnesium in both his palpitations and those of his wife I significantly

## 2012-06-21 NOTE — Progress Notes (Signed)
HPI  Robert Burch is a 78 y.o. male Seen in followup for paroxysmal atrial fibrillation in the setting of nonischemic coronary disease by recent catheterization and modest hypertension. His atrial fib is largely quiet currently with last episode in JUne 2012 He was referred to Dr Tommie Ard for consideration of pulm vein isolation;  He is not interested in ablation at this time  Echo cardiogram at that time demonstrated normal left ventricular function left atrial dimension was 4.4.  He has had no intercurrent atrial fibrillation  He was seen by Dr. Marcheta Grammes at the end of May. He was noted to have orthostatic intolerance and the patient has stopped his antihypertensives, specifically his Diovan. He also underwent echocardiogram which demonstrated normal left ventricular function and moderate left atrial enlargement with a dimension of 49 mm  He continues to complain of orthostatic and exercise intolerance. He has noted some problems recently with dry mouth over the last 3-6 months. There is some changes in his bowel pattern with more constipation there are no dry eyes and there is no problem with urination.   Past Medical History  Diagnosis Date  . GERD (gastroesophageal reflux disease)   . IBS (irritable bowel syndrome)   . Diverticulosis   . Hypertension   . Nonischemic cardiomyopathy     EF 45% by echo 2010  . Coronary artery disease     Nonobstructive; very minor irregularities by cath 7/10 repeat 2010 also essentially normal  . Atypical chest pain 2/10, 6-7/10    ETT-myoview done 7/10, pt exercised 10 minutes, 11.7 METS but only reached 75% MPHR; stopped due to fatigue; no ECG changes; appeared to be small area of apical lateral ischemia; EF calculated at 49% but appeared better isually; confirmatory echo showed normal LV systolic function  . Paroxysmal atrial fibrillation     Treated with flecainide  . Drug therapy     Chronic Coumadin    Past Surgical History  Procedure Date  .  Cardiac catheterization   . Mass biopsy 2013    Current Outpatient Prescriptions  Medication Sig Dispense Refill  . ALPRAZolam (XANAX) 0.25 MG tablet Take 0.25 mg by mouth. Three times a week      . Cholecalciferol (VITAMIN D3) 400 UNITS CAPS Take 1 capsule by mouth daily.        Marland Kitchen co-enzyme Q-10 30 MG capsule Take 30 mg by mouth daily.        . Coenzyme Q10 50 MG CAPS Take 1 capsule by mouth daily.        Marland Kitchen diltiazem (CARDIZEM) 30 MG tablet Take 30 mg by mouth 2 (two) times daily.      . Flaxseed, Linseed, (FLAX SEED OIL) 1000 MG CAPS Take 1 capsule by mouth daily.        . flecainide (TAMBOCOR) 50 MG tablet TAKE 1 & 1/2 TABLETS (75 MG TOTAL) BY MOUTH 2 TIMES DAILY.  270 tablet  1  . GABAPENTIN, PHN, PO Take 200 mg by mouth daily.       Marland Kitchen MAGNESIUM CHLORIDE ER PO Take 325 mg by mouth daily.      . Multiple Vitamin (MULTIVITAMIN) tablet Take 1 tablet by mouth daily.        . Omega-3 Fatty Acids (FISH OIL) 1000 MG CAPS Take 1 capsule by mouth daily.        . pantoprazole (PROTONIX) 40 MG tablet Take 40 mg by mouth.       . ranitidine (ZANTAC) 150 MG tablet Take  150 mg by mouth daily.      . Tamsulosin HCl (FLOMAX) 0.4 MG CAPS Take 0.4 mg by mouth daily.        Marland Kitchen warfarin (COUMADIN) 2.5 MG tablet Take 2.5 mg by mouth daily.         Allergies  Allergen Reactions  . Sulfonamide Derivatives     Rash     Review of Systems negative except from HPI and PMH  Physical Exam BP 118/78  Pulse 57  Ht 5\' 10"  (1.778 m)  Wt 198 lb 4 oz (89.926 kg)  BMI 28.45 kg/m2 Well developed and nourished in no acute distress HENT normal Neck supple with JVP-flat Clear Regular rate and rhythm, no murmurs or gallops Abd-soft with active BS No Clubbing cyanosis edema Skin-warm and dry A & Oriented  Grossly normal sensory and motor function    Assessment and  Plan

## 2012-06-21 NOTE — Assessment & Plan Note (Signed)
The patient continues to have symptoms of orthostatic lightheadedness which seemed to been progressive over the last year or 2 and are now associated with some other more systemic symptoms suggestive of dysautonomia

## 2012-06-21 NOTE — Patient Instructions (Addendum)
Your physician wants you to follow-up in: 3-4 months with Dr. Klein. You will receive a reminder letter in the mail two months in advance. If you don't receive a letter, please call our office to schedule the follow-up appointment.  

## 2012-06-21 NOTE — Assessment & Plan Note (Signed)
I am impressed that his blood pressure is normalizing as he ages. While his autonomic reflex arc seems to be intact today on his orthostatics, he does raise the question my mind as to the cause of his blood pressure issues and we may need to investigate this further. For now I have reviewed with him isometric contraction of the means to mitigate symptoms related to orthostasis.

## 2012-06-26 ENCOUNTER — Other Ambulatory Visit: Payer: Self-pay | Admitting: Internal Medicine

## 2012-09-20 ENCOUNTER — Ambulatory Visit (INDEPENDENT_AMBULATORY_CARE_PROVIDER_SITE_OTHER): Payer: Medicare Other | Admitting: Internal Medicine

## 2012-09-20 ENCOUNTER — Encounter: Payer: Self-pay | Admitting: Internal Medicine

## 2012-09-20 VITALS — BP 130/82 | HR 65 | Ht 71.0 in | Wt 201.5 lb

## 2012-09-20 DIAGNOSIS — R079 Chest pain, unspecified: Secondary | ICD-10-CM

## 2012-09-20 DIAGNOSIS — I10 Essential (primary) hypertension: Secondary | ICD-10-CM

## 2012-09-20 DIAGNOSIS — I4891 Unspecified atrial fibrillation: Secondary | ICD-10-CM

## 2012-09-20 NOTE — Progress Notes (Signed)
HPI  Robert Burch is a 78 y.o. male Seen in followup for paroxysmal atrial fibrillation in the setting of nonischemic coronary disease by recent catheterization and modest hypertension. His atrial fib is largely quiet currently with last episode in JUne 2012 He was referred to Dr Tommie Ard for consideration of pulm vein isolation;  he declined ablation; and has been on magnesium with significant interval quiet of his atrial fibrillation   Echo cardiogram at that time demonstrated normal left ventricular function left atrial dimension was 4.4.   [facility] and his Diovan was discontinued.  I saw him in July and we discussed isometric maneuvers for control of his orthostasis. There were no significant   symptoms apart from dry mouth to suggest systeic autonomic     He ha he's had decreased lightheadedness since we stopped his diltiazem. Mostly it occurs following significant exertion and standing He has rare flutters but no atrial fibrillation.  Past Medical History  Diagnosis Date  . GERD (gastroesophageal reflux disease)   . IBS (irritable bowel syndrome)   . Diverticulosis   . Hypertension   . Nonischemic cardiomyopathy     EF 45% by echo 2010  . Coronary artery disease     Nonobstructive; very minor irregularities by cath 7/10 repeat 2010 also essentially normal  . Atypical chest pain 2/10, 6-7/10    ETT-myoview done 7/10, pt exercised 10 minutes, 11.7 METS but only reached 75% MPHR; stopped due to fatigue; no ECG changes; appeared to be small area of apical lateral ischemia; EF calculated at 49% but appeared better isually; confirmatory echo showed normal LV systolic function  . Paroxysmal atrial fibrillation     Treated with flecainide  . Drug therapy     Chronic Coumadin  . Paralyzed vocal cords     Past Surgical History  Procedure Date  . Cardiac catheterization   . Mass biopsy 2013    Current Outpatient Prescriptions    Medication Sig Dispense Refill  . ALPRAZolam (XANAX) 0.25 MG tablet Take 0.25 mg by mouth. Three times a week      . Cholecalciferol (VITAMIN D3) 400 UNITS CAPS Take 1 capsule by mouth daily.        Marland Kitchen co-enzyme Q-10 30 MG capsule Take 30 mg by mouth daily.        . Coenzyme Q10 50 MG CAPS Take 1 capsule by mouth daily.        . Flaxseed, Linseed, (FLAX SEED OIL) 1000 MG CAPS Take 1 capsule by mouth daily.        . flecainide (TAMBOCOR) 50 MG tablet TAKE 1 & 1/2 TABLETS (75 MG TOTAL) BY MOUTH 2 TIMES DAILY.  270 tablet  1  . GABAPENTIN, PHN, PO Take 200 mg by mouth daily.       Marland Kitchen MAGNESIUM CHLORIDE ER PO Take 325 mg by mouth daily.      . Multiple Vitamin (MULTIVITAMIN) tablet Take 1 tablet by mouth daily.        . Omega-3 Fatty Acids (FISH OIL) 1000 MG CAPS Take 1 capsule by mouth daily.        . pantoprazole (PROTONIX) 40 MG tablet Take 40 mg by mouth.       . ranitidine (ZANTAC) 150 MG tablet Take 150 mg by mouth daily.      . Tamsulosin HCl (FLOMAX) 0.4 MG CAPS Take 0.4 mg by mouth daily.        Marland Kitchen warfarin (COUMADIN)  2.5 MG tablet Take 2.5 mg by mouth daily.         Allergies  Allergen Reactions  . Sulfonamide Derivatives     Rash     Review of Systems negative except from HPI and PMH  Physical Exam BP 130/82  Pulse 65  Ht 5\' 11"  (1.803 m)  Wt 201 lb 8 oz (91.4 kg)  BMI 28.10 kg/m2 Well developed and nourished in no acute distress HENT normal Neck supple with JVP-flat Clear Regular rate and rhythm, no murmurs or gallops Abd-soft with active BS No Clubbing cyanosis edema Skin-warm and dry A & Oriented  Grossly normal sensory and motor function   Electrocardiogram demonstrates sinus rhythm at 65 Intervals 18/12/41 Otherwise normal Assessment and  Plan   Orthostatic lightheadedness -   improved with the discontinuation of diltiazem. He remains in significant evidence to suggest systemic dysautonomia although I have to keep this in mind     r  ATRIAL  FIBRILLATION -  T he atrial fibrillation is quiet. We'll continue him on his flecainide aware that he is not taking any neural blocking agents for the reasons mentioned above   HYPERTENSION, BENIGN -  stable

## 2012-09-20 NOTE — Patient Instructions (Signed)
Your physician wants you to follow-up in: 6 months with Dr. Klein. You will receive a reminder letter in the mail two months in advance. If you don't receive a letter, please call our office to schedule the follow-up appointment.  Your physician recommends that you continue on your current medications as directed. Please refer to the Current Medication list given to you today.  

## 2012-12-20 ENCOUNTER — Other Ambulatory Visit: Payer: Self-pay | Admitting: Internal Medicine

## 2013-03-07 ENCOUNTER — Encounter: Payer: Self-pay | Admitting: *Deleted

## 2013-03-09 ENCOUNTER — Encounter: Payer: Self-pay | Admitting: Internal Medicine

## 2013-03-09 ENCOUNTER — Ambulatory Visit (INDEPENDENT_AMBULATORY_CARE_PROVIDER_SITE_OTHER): Payer: Medicare Other | Admitting: Internal Medicine

## 2013-03-09 VITALS — BP 102/60 | HR 63 | Ht 71.0 in | Wt 203.2 lb

## 2013-03-09 DIAGNOSIS — R079 Chest pain, unspecified: Secondary | ICD-10-CM

## 2013-03-09 DIAGNOSIS — R42 Dizziness and giddiness: Secondary | ICD-10-CM

## 2013-03-09 DIAGNOSIS — I4891 Unspecified atrial fibrillation: Secondary | ICD-10-CM

## 2013-03-09 NOTE — Assessment & Plan Note (Signed)
Recurrent problem   ECg with evidence of prior IMI Will do stress myoview

## 2013-03-09 NOTE — Assessment & Plan Note (Signed)
improved

## 2013-03-09 NOTE — Patient Instructions (Addendum)
No medication changes were made today.  Dr. Graciela Husbands has recommended for you to have a stress myoview.    [facility] MYOVIEW  Your caregiver has ordered a Stress Test with nuclear imaging. The purpose of this test is to evaluate the blood supply to your heart muscle. This procedure is referred to as a "Non-Invasive Stress Test." This is because other than having an IV started in your vein, nothing is inserted or "invades" your body. Cardiac stress tests are done to find areas of poor blood flow to the heart by determining the extent of coronary artery disease (CAD). Some patients exercise on a treadmill, which naturally increases the blood flow to your heart, while others who are  unable to walk on a treadmill due to physical limitations have a pharmacologic/chemical stress agent called Lexiscan . This medicine will mimic walking on a treadmill by temporarily increasing your coronary blood flow.   Please note: these test may take anywhere between 2-4 hours to complete  PLEASE REPORT TO [facility]TR[facility]  THE VOLUNTEERS AT THE FIRST DESK WILL DIRECT YOU WHERE TO GO  Date of Procedure:__________3/27/2014______________  Arrival Time for Procedure:________7:00 am__________  Instructions regarding medication:   You can take all your regular medications with water.  PLEASE NOTIFY THE OFFICE AT LEAST 24 HOURS IN ADVANCE IF YOU ARE UNABLE TO KEEP YOUR APPOINTMENT.  [id number]  How to prepare for your Myoview test:  1. Do not eat or drink after midnight 2. No caffeine for 24 hours prior to test 3. Your medication may be taken with water.  If your doctor stopped a medication because of this test, do not take that medication. 4. Ladies, please do not wear dresses.  Skirts or pants are appropriate. Please wear a short sleeve shirt. 5. No perfume, cologne or lotion. 6. Wear comfortable walking shoes. No heels!

## 2013-03-09 NOTE — Progress Notes (Signed)
Patient Care Team: Dorothey Baseman as PCP - General (Family Medicine)   HPI  Robert Burch is a 79 y.o. male Seen in followup for paroxysmal atrial fibrillation in the setting of nonischemic coronary disease by recent catheterization and modest hypertension. His atrial fib is largely quiet currently with last episode in JUne 2012 He was referred to Dr Tommie Ard for consideration of pulm vein isolation;  he declined ablation; and has been on magnesium with significant interval quiet of his atrial fibrillation  Echo cardiogram at that time demonstrated normal left ventricular function left atrial dimension was 4.4.   [facility] and his Diovan was discontinued. We have discussed maneuvers  This is better  He has had chest pain 50 % of the last 3 days  Not aggavated by swimming or walking and improved with a hot pack    Past Medical History  Diagnosis Date  . GERD (gastroesophageal reflux disease)   . IBS (irritable bowel syndrome)   . Diverticulosis   . Hypertension   . Nonischemic cardiomyopathy     EF 45% by echo 2010  . Coronary artery disease     Nonobstructive; very minor irregularities by cath 7/10 repeat 2010 also essentially normal  . Atypical chest pain 2/10, 6-7/10    ETT-myoview done 7/10, pt exercised 10 minutes, 11.7 METS but only reached 75% MPHR; stopped due to fatigue; no ECG changes; appeared to be small area of apical lateral ischemia; EF calculated at 49% but appeared better isually; confirmatory echo showed normal LV systolic function  . Paroxysmal atrial fibrillation     Treated with flecainide  . Drug therapy     Chronic Coumadin  . Paralyzed vocal cords   . Chronic anxiety   . Proteinuria     History of  . Cancer     skin    Past Surgical History  Procedure Laterality Date  . Cardiac catheterization    . Mass biopsy  2013  . Cataract surgery Bilateral     Current Outpatient Prescriptions  Medication Sig  Dispense Refill  . ALPRAZolam (XANAX) 0.25 MG tablet Take 0.25 mg by mouth. Three times a week      . Cholecalciferol (VITAMIN D3) 400 UNITS CAPS Take 1 capsule by mouth daily.        Marland Kitchen co-enzyme Q-10 30 MG capsule Take 30 mg by mouth daily.        . Coenzyme Q10 50 MG CAPS Take 1 capsule by mouth daily.        . Flaxseed, Linseed, (FLAX SEED OIL) 1000 MG CAPS Take 1 capsule by mouth daily.        . flecainide (TAMBOCOR) 50 MG tablet TAKE 1 & 1/2 TABLETS (75 MG TOTAL) BY MOUTH 2 TIMES DAILY.  270 tablet  1  . GABAPENTIN, PHN, PO Take 200 mg by mouth daily.       Marland Kitchen MAGNESIUM CHLORIDE ER PO Take 325 mg by mouth daily.      . Multiple Vitamin (MULTIVITAMIN) tablet Take 1 tablet by mouth daily.        . Omega-3 Fatty Acids (FISH OIL) 1000 MG CAPS Take 1 capsule by mouth daily.        . pantoprazole (PROTONIX) 40 MG tablet Take 40 mg by mouth.       . ranitidine (ZANTAC) 150 MG tablet Take 150 mg by mouth daily.      . Tamsulosin HCl (FLOMAX) 0.4 MG CAPS  Take 0.4 mg by mouth daily.        Marland Kitchen warfarin (COUMADIN) 2.5 MG tablet Take 2.5 mg by mouth daily.        No current facility-administered medications for this visit.    Allergies  Allergen Reactions  . Lanoxin (Digoxin)   . Lopressor (Metoprolol Tartrate)   . Sulfonamide Derivatives     Rash   . Verapamil     Review of Systems negative except from HPI and PMH  Physical Exam BP 102/60  Pulse 63  Ht 5\' 11"  (1.803 m)  Wt 203 lb 3 oz (92.165 kg)  BMI 28.35 kg/m2 Well developed and well nourished in no acute distress HENT normal E scleral and icterus clear Neck Supple JVP flat; carotids brisk and full Clear to ausculation  Regular rate and rhythm, no murmurs gallops or rub Soft with active bowel sounds No clubbing cyanosis none Edema Alert and oriented, grossly normal motor and sensory function Skin Warm and Dry  ECG: Sinus Rhythm  @63             Intervals  17/12/39  Axis 17    Inferior Q waves  Assessment and  Plan

## 2013-03-09 NOTE — Assessment & Plan Note (Signed)
Paroxysmal  No significant symptoms  On flecainide and thus exclusion of CAD becomes all the more important

## 2013-03-16 ENCOUNTER — Ambulatory Visit: Payer: Self-pay | Admitting: Internal Medicine

## 2013-03-16 ENCOUNTER — Telehealth: Payer: Self-pay

## 2013-03-16 DIAGNOSIS — R079 Chest pain, unspecified: Secondary | ICD-10-CM

## 2013-03-16 NOTE — Telephone Encounter (Signed)
Pt says Dr. Graciela Husbands started pt on ASA 81 mg at last office visit "until we get results of stress test" Pt had stress test this am dont know results yet I explained ok to take  Understanding verb

## 2013-03-16 NOTE — Telephone Encounter (Signed)
Pt called and states he had an stress test with Dr Mariah Milling this a.m. He wants to know if he should take his aspirin today. Please advise.

## 2013-03-17 ENCOUNTER — Other Ambulatory Visit: Payer: Self-pay

## 2013-03-17 ENCOUNTER — Telehealth: Payer: Self-pay

## 2013-03-17 DIAGNOSIS — R079 Chest pain, unspecified: Secondary | ICD-10-CM

## 2013-03-17 NOTE — Telephone Encounter (Signed)
Pt informed of preliminary stress test results (negative for ischemia, "low risk scan") He asks if he can discontinue ASA that Dr. Graciela Husbands started at last OV until stress test results came back I explained staying on the ASA will not harm him He is on warfarin as well and is concerned about the combination I advised ok to stop since stress test was negative if concerned about bleeding I also explained, if he continues to have chest tightness/discomfort, to not ignore this but to let us know since we may need to do more testing, etc He verb understanding and will let us know

## 2013-03-17 NOTE — Telephone Encounter (Signed)
Pt would like results from stress test.

## 2013-03-19 NOTE — Telephone Encounter (Signed)
It is reasonable to STOP ASA  thanks

## 2013-06-18 ENCOUNTER — Other Ambulatory Visit: Payer: Self-pay | Admitting: Internal Medicine

## 2013-06-19 ENCOUNTER — Other Ambulatory Visit: Payer: Self-pay | Admitting: *Deleted

## 2013-06-20 ENCOUNTER — Other Ambulatory Visit: Payer: Self-pay | Admitting: Internal Medicine

## 2013-06-20 NOTE — Telephone Encounter (Signed)
LHB patient.

## 2013-06-21 ENCOUNTER — Other Ambulatory Visit: Payer: Self-pay

## 2013-06-21 MED ORDER — FLECAINIDE ACETATE 50 MG PO TABS: ORAL_TABLET | ORAL | Status: DC

## 2013-06-21 NOTE — Telephone Encounter (Signed)
Refill sent to [facility] for Flecainide.

## 2013-08-31 DIAGNOSIS — R339 Retention of urine, unspecified: Secondary | ICD-10-CM | POA: Insufficient documentation

## 2013-08-31 DIAGNOSIS — N138 Other obstructive and reflux uropathy: Secondary | ICD-10-CM | POA: Insufficient documentation

## 2013-08-31 DIAGNOSIS — N400 Enlarged prostate without lower urinary tract symptoms: Secondary | ICD-10-CM | POA: Insufficient documentation

## 2013-08-31 DIAGNOSIS — N411 Chronic prostatitis: Secondary | ICD-10-CM | POA: Insufficient documentation

## 2013-08-31 DIAGNOSIS — N529 Male erectile dysfunction, unspecified: Secondary | ICD-10-CM | POA: Insufficient documentation

## 2013-10-16 ENCOUNTER — Telehealth: Payer: Self-pay | Admitting: *Deleted

## 2013-10-16 NOTE — Telephone Encounter (Signed)
The patient called today with complaints of intermittent chest pain since Saturday. Pain is midsternal with some radiation left and right. Pain is not effected by movement. He has no other associated symptoms with his chest pain. No complaint of palpitations. He is requesting an appointment to be seen. Appointment scheduled with Dr. Mariah Milling tomorrow at 2:45 pm. Rip Harbour per [place].

## 2013-10-17 ENCOUNTER — Encounter: Payer: Self-pay | Admitting: Cardiovascular Disease

## 2013-10-17 ENCOUNTER — Ambulatory Visit (INDEPENDENT_AMBULATORY_CARE_PROVIDER_SITE_OTHER): Payer: Medicare Other | Admitting: Cardiovascular Disease

## 2013-10-17 VITALS — BP 126/77 | HR 64 | Ht 71.0 in | Wt 195.8 lb

## 2013-10-17 DIAGNOSIS — F411 Generalized anxiety disorder: Secondary | ICD-10-CM

## 2013-10-17 DIAGNOSIS — I4891 Unspecified atrial fibrillation: Secondary | ICD-10-CM

## 2013-10-17 DIAGNOSIS — R079 Chest pain, unspecified: Secondary | ICD-10-CM

## 2013-10-17 DIAGNOSIS — F419 Anxiety disorder, unspecified: Secondary | ICD-10-CM | POA: Insufficient documentation

## 2013-10-17 DIAGNOSIS — I10 Essential (primary) hypertension: Secondary | ICD-10-CM

## 2013-10-17 NOTE — Assessment & Plan Note (Signed)
Blood pressure is well controlled on today's visit. No changes made to the medications. 

## 2013-10-17 NOTE — Patient Instructions (Addendum)
You are doing well. No medication changes were made.  Please try aleve/naproxen  for chest pain 500 mg as needed (AM and PM)  Please call us if you have new issues that need to be addressed before your next appt.  Your physician wants you to follow-up in: 12 months.  You will receive a reminder letter in the mail two months in advance. If you don't receive a letter, please call our office to schedule the follow-up appointment.

## 2013-10-17 NOTE — Assessment & Plan Note (Signed)
Chest pain is atypical in nature. No symptoms with exertion. We have recommended rare use of NSAIDs, start using hot compresses. Hold off on swimming if he has symptoms as this may aggravate his condition

## 2013-10-17 NOTE — Progress Notes (Signed)
Patient ID: Robert Burch, male    DOB: July 16, 1933, 79 y.o.   MRN: [id number]  HPI Comments: 79 yo with history of paroxysmal atrial fibrillation, h/o chest pain, catheterization in 2007 showing nonobstructive CAD, Presenting for routine followup.  He presents today with new symptoms of chest discomfort. He reports that his discomfort starts in his mediastinum and radiates bilaterally. He describes it as a ache in his chest. He does not have symptoms with swimming or when he goes walking for exercise. He has symptoms at rest. Sometimes has some relief when he belches. He's currently swims 2 days per week. Wonders if he has some discomfort after he swims, possibly from muscle. He has been afraid to take any NSAIDs. Not using a hot compress.   Previously had significant GI workup for discomfort, most of which has been negative. This included an EGD, abdominal ultrasound. He does take Tylenol and gabapentin.  No recent atrial fibrillation for several years He does have occasional palpitations  He does have a chronic issue with sweats that seem to come on, unprovoked. Etiology is uncertain. It is not clear how much Xanax he is taking though he does report having a long history of anxiety.  EKG shows normal sinus rhythm with rate 64 beats per minute with no significant ST or T wave changes Recent blood work shows her cholesterol 162 LDL 100, HDL 40      Outpatient Encounter Prescriptions as of 10/17/2013  Medication Sig Dispense Refill  . ALPRAZolam (XANAX) 0.25 MG tablet Take 0.25 mg by mouth. Three times a week      . Cholecalciferol (VITAMIN D3) 400 UNITS CAPS Take 1 capsule by mouth daily.        Marland Kitchen co-enzyme Q-10 30 MG capsule Take 30 mg by mouth daily.        . Coenzyme Q10 50 MG CAPS Take 1 capsule by mouth daily.        . Cyanocobalamin (VITAMIN B-12 PO) Take 200 mg by mouth daily.      . Flaxseed, Linseed, (FLAX SEED OIL) 1000 MG CAPS Take 1 capsule by mouth daily.        .  flecainide (TAMBOCOR) 50 MG tablet TAKE 1 & 1/2 TABLETS (75 MG TOTAL) BY MOUTH 2 TIMES DAILY.  270 tablet  1  . GABAPENTIN, PHN, PO Take 200 mg by mouth daily.       Marland Kitchen MAGNESIUM CHLORIDE ER PO Take 325 mg by mouth daily.      . Multiple Vitamin (MULTIVITAMIN) tablet Take 1 tablet by mouth daily.        . Omega-3 Fatty Acids (FISH OIL) 1000 MG CAPS Take 1 capsule by mouth daily.        . pantoprazole (PROTONIX) 40 MG tablet Take 40 mg by mouth.       . ranitidine (ZANTAC) 150 MG tablet Take 150 mg by mouth daily.      . Tamsulosin HCl (FLOMAX) 0.4 MG CAPS Take 0.4 mg by mouth daily.        . valsartan (DIOVAN) 40 MG tablet Take 40 mg by mouth daily.      Marland Kitchen warfarin (COUMADIN) 2.5 MG tablet Take 2.5 mg by mouth daily.        No facility-administered encounter medications on file as of 10/17/2013.     Review of Systems  Constitutional: Negative.   HENT: Negative.   Eyes: Negative.   Respiratory: Negative.   Cardiovascular: Positive for chest pain.  Gastrointestinal: Negative.   Endocrine: Negative.   Musculoskeletal: Positive for myalgias.  Skin: Negative.   Allergic/Immunologic: Negative.   Neurological: Negative.        Sweats  Hematological: Negative.   Psychiatric/Behavioral: Negative.   All other systems reviewed and are negative.    BP 126/77  Pulse 64  Ht 5\' 11"  (1.803 m)  Wt 195 lb 12 oz (88.792 kg)  BMI 27.31 kg/m2  Physical Exam  Nursing note and vitals reviewed. Constitutional: He is oriented to person, place, and time. He appears well-developed and well-nourished.  HENT:  Head: Normocephalic.  Nose: Nose normal.  Mouth/Throat: Oropharynx is clear and moist.  Eyes: Conjunctivae are normal. Pupils are equal, round, and reactive to light.  Neck: Normal range of motion. Neck supple. No JVD present.  Cardiovascular: Normal rate, regular rhythm, S1 normal, S2 normal, normal heart sounds and intact distal pulses.  Exam reveals no gallop and no friction rub.   No  murmur heard. Pulmonary/Chest: Effort normal and breath sounds normal. No respiratory distress. He has no wheezes. He has no rales. He exhibits no tenderness.  Abdominal: Soft. Bowel sounds are normal. He exhibits no distension. There is no tenderness.  Musculoskeletal: Normal range of motion. He exhibits no edema and no tenderness.  Lymphadenopathy:    He has no cervical adenopathy.  Neurological: He is alert and oriented to person, place, and time. Coordination normal.  Skin: Skin is warm and dry. No rash noted. No erythema.  Psychiatric: He has a normal mood and affect. His behavior is normal. Judgment and thought content normal.      Assessment and Plan

## 2013-10-17 NOTE — Assessment & Plan Note (Signed)
No recent episodes of atrial fibrillation. We'll continue flecainide

## 2013-10-17 NOTE — Assessment & Plan Note (Signed)
He take Xanax when necessary. Seems to be doing relatively well though anxious about his chest discomfort

## 2013-10-24 ENCOUNTER — Ambulatory Visit: Payer: Self-pay | Admitting: Gastroenterology

## 2013-11-14 ENCOUNTER — Encounter: Payer: Self-pay | Admitting: Internal Medicine

## 2013-11-14 ENCOUNTER — Ambulatory Visit (INDEPENDENT_AMBULATORY_CARE_PROVIDER_SITE_OTHER): Payer: Medicare Other | Admitting: Internal Medicine

## 2013-11-14 VITALS — BP 137/79 | HR 66 | Ht 71.0 in | Wt 196.5 lb

## 2013-11-14 DIAGNOSIS — I10 Essential (primary) hypertension: Secondary | ICD-10-CM

## 2013-11-14 DIAGNOSIS — R55 Syncope and collapse: Secondary | ICD-10-CM

## 2013-11-14 DIAGNOSIS — I4891 Unspecified atrial fibrillation: Secondary | ICD-10-CM

## 2013-11-14 NOTE — Patient Instructions (Signed)
Your physician wants you to follow-up in: 6 months with Dr. Klein. You will receive a reminder letter in the mail two months in advance. If you don't receive a letter, please call our office to schedule the follow-up appointment.  Your physician recommends that you continue on your current medications as directed. Please refer to the Current Medication list given to you today.  

## 2013-11-14 NOTE — Assessment & Plan Note (Signed)
No recent syncope.

## 2013-11-14 NOTE — Assessment & Plan Note (Signed)
Atrial fibrillation is quiet. We discussed the role of NOACs therapy. We reviewed risks and benefits; he would like to stay on warfarin

## 2013-11-14 NOTE — Assessment & Plan Note (Signed)
Reasonably controlled 

## 2013-11-14 NOTE — Progress Notes (Signed)
Patient Care Team: Dorothey Baseman as PCP - General (Family Medicine)   HPI  Robert Burch is a 79 y.o. male Seen in followup for paroxysmal atrial fibrillation in the setting of nonischemic coronary disease by recent catheterization and modest hypertension.   His atrial fib is largely quiet currently with last episode in JUne 2012 He was referred to Dr Tommie Ard for consideration of pulm vein isolation; he declined ablation; and has been on magnesium without significant atrial fibrillation  Saw Dr. Knute Neu a couple weeks ago for chest pain that has resolved.     Past Medical History  Diagnosis Date  . GERD (gastroesophageal reflux disease)   . IBS (irritable bowel syndrome)   . Diverticulosis   . Hypertension   . Nonischemic cardiomyopathy     EF 45% by echo 2010  . Coronary artery disease     Nonobstructive; very minor irregularities by cath 7/10 repeat 2010 also essentially normal  . Atypical chest pain 2/10, 6-7/10    ETT-myoview done 7/10, pt exercised 10 minutes, 11.7 METS but only reached 75% MPHR; stopped due to fatigue; no ECG changes; appeared to be small area of apical lateral ischemia; EF calculated at 49% but appeared better isually; confirmatory echo showed normal LV systolic function  . Paroxysmal atrial fibrillation     Treated with flecainide  . Drug therapy     Chronic Coumadin  . Paralyzed vocal cords   . Chronic anxiety   . Proteinuria     History of  . Cancer     skin    Past Surgical History  Procedure Laterality Date  . Cardiac catheterization    . Mass biopsy  2013  . Cataract surgery Bilateral     Current Outpatient Prescriptions  Medication Sig Dispense Refill  . ALPRAZolam (XANAX) 0.25 MG tablet Take 0.25 mg by mouth. Three times a week      . Cholecalciferol (VITAMIN D3) 400 UNITS CAPS Take 1 capsule by mouth daily.        Marland Kitchen co-enzyme Q-10 30 MG capsule Take 30 mg by mouth daily.        . Coenzyme Q10 50 MG CAPS Take 1 capsule by  mouth daily.        . Cyanocobalamin (VITAMIN B-12 PO) Take 200 mg by mouth daily.      . Flaxseed, Linseed, (FLAX SEED OIL) 1000 MG CAPS Take 1 capsule by mouth daily.        . flecainide (TAMBOCOR) 50 MG tablet TAKE 1 & 1/2 TABLETS (75 MG TOTAL) BY MOUTH 2 TIMES DAILY.  270 tablet  1  . GABAPENTIN, PHN, PO Take 200 mg by mouth daily.       Marland Kitchen MAGNESIUM CHLORIDE ER PO Take 325 mg by mouth daily.      . Multiple Vitamin (MULTIVITAMIN) tablet Take 1 tablet by mouth daily.        . Omega-3 Fatty Acids (FISH OIL) 1000 MG CAPS Take 1 capsule by mouth daily.        . pantoprazole (PROTONIX) 40 MG tablet Take 40 mg by mouth.       . ranitidine (ZANTAC) 150 MG tablet Take 150 mg by mouth daily.      . Tamsulosin HCl (FLOMAX) 0.4 MG CAPS Take 0.4 mg by mouth daily.        . valsartan (DIOVAN) 80 MG tablet Take 80 mg by mouth daily.      Marland Kitchen warfarin (COUMADIN) 2.5  MG tablet Take 2.5 mg by mouth daily.        No current facility-administered medications for this visit.    Allergies  Allergen Reactions  . Lanoxin [Digoxin]   . Lopressor [Metoprolol Tartrate]   . Sulfonamide Derivatives     Rash   . Verapamil     Review of Systems negative except from HPI and PMH  Physical Exam BP 137/79  Pulse 66  Ht 5\' 11"  (1.803 m)  Wt 196 lb 8 oz (89.132 kg)  BMI 27.42 kg/m2 Well developed and well nourished in no acute distress HENT normal E scleral and icterus clear Neck Supple JVP flat; carotids brisk and full Clear to ausculation  regular rate and rhythm, no murmurs   Soft with active bowel sounds No clubbing cyanosis none Edema Alert and oriented, grossly normal motor and sensory function Skin Warm and Dry    Assessment and  Plan

## 2013-12-06 DIAGNOSIS — R972 Elevated prostate specific antigen [PSA]: Secondary | ICD-10-CM | POA: Insufficient documentation

## 2013-12-13 ENCOUNTER — Other Ambulatory Visit: Payer: Self-pay | Admitting: Internal Medicine

## 2014-04-11 ENCOUNTER — Ambulatory Visit: Payer: Self-pay | Admitting: Unknown Physician Specialty

## 2014-04-24 ENCOUNTER — Encounter: Payer: Self-pay | Admitting: Nurse Practitioner

## 2014-04-24 ENCOUNTER — Telehealth: Payer: Self-pay

## 2014-04-24 ENCOUNTER — Ambulatory Visit (INDEPENDENT_AMBULATORY_CARE_PROVIDER_SITE_OTHER): Payer: Medicare Other | Admitting: Nurse Practitioner

## 2014-04-24 VITALS — BP 142/90 | HR 60 | Ht 71.0 in | Wt 196.2 lb

## 2014-04-24 DIAGNOSIS — R079 Chest pain, unspecified: Secondary | ICD-10-CM

## 2014-04-24 DIAGNOSIS — R0789 Other chest pain: Secondary | ICD-10-CM

## 2014-04-24 DIAGNOSIS — I480 Paroxysmal atrial fibrillation: Secondary | ICD-10-CM | POA: Insufficient documentation

## 2014-04-24 DIAGNOSIS — I4891 Unspecified atrial fibrillation: Secondary | ICD-10-CM

## 2014-04-24 DIAGNOSIS — R072 Precordial pain: Secondary | ICD-10-CM

## 2014-04-24 DIAGNOSIS — K219 Gastro-esophageal reflux disease without esophagitis: Secondary | ICD-10-CM | POA: Insufficient documentation

## 2014-04-24 DIAGNOSIS — I10 Essential (primary) hypertension: Secondary | ICD-10-CM

## 2014-04-24 MED ORDER — NITROGLYCERIN 0.4 MG SL SUBL: 0.4000 mg | SUBLINGUAL_TABLET | SUBLINGUAL | Status: DC | PRN

## 2014-04-24 NOTE — Telephone Encounter (Signed)
Pt called, states he has been having some chest discomfort for several days. Please call.

## 2014-04-24 NOTE — Telephone Encounter (Signed)
Spoke w/ pt.  He reports chest discomfort for the past week. States that she believes it is r/t anxiety, as he is sched for an epidural injection at 3:00 this afternoon. Pt sched to see Ignacia Bayley, NP today at 1:45.

## 2014-04-24 NOTE — Patient Instructions (Addendum)
Your physician recommends that you schedule a follow-up appointment in: 3 weeks with Dr. Rockey Situ  [facility] MYOVIEW  Your caregiver has ordered a Stress Test with nuclear imaging. The purpose of this test is to evaluate the blood supply to your heart muscle. This procedure is referred to as a "Non-Invasive Stress Test." This is because other than having an IV started in your vein, nothing is inserted or "invades" your body. Cardiac stress tests are done to find areas of poor blood flow to the heart by determining the extent of coronary artery disease (CAD). Some patients exercise on a treadmill, which naturally increases the blood flow to your heart, while others who are  unable to walk on a treadmill due to physical limitations have a pharmacologic/chemical stress agent called Lexiscan . This medicine will mimic walking on a treadmill by temporarily increasing your coronary blood flow.   Please note: these test may take anywhere between 2-4 hours to complete  PLEASE REPORT TO [facility] AT THE FIRST DESK WILL DIRECT YOU WHERE TO GO  Date of Procedure:_______Tuesday, May 5______________  Arrival Time for Procedure:________7:15am_____________  PLEASE NOTIFY THE OFFICE AT LEAST 24 HOURS IN ADVANCE IF YOU ARE UNABLE TO KEEP YOUR APPOINTMENT.  [phone] AND  PLEASE NOTIFY NUCLEAR MEDICINE AT [facility] AT LEAST 24 HOURS IN ADVANCE IF YOU ARE UNABLE TO KEEP YOUR APPOINTMENT. [phone]  How to prepare for your Myoview test:  1. Do not eat or drink after midnight 2. No caffeine for 24 hours prior to test 3. No smoking 24 hours prior to test. 4. Your medication may be taken with water.  If your doctor stopped a medication because of this test, do not take that medication. 5. Ladies, please do not wear dresses.  Skirts or pants are appropriate. Please wear a short sleeve shirt. 6. No perfume, cologne or lotion. 7. Wear comfortable walking shoes. No heels!

## 2014-04-24 NOTE — Progress Notes (Signed)
Patient Name: Robert Burch Date of Encounter: 04/24/2014  Primary Care Provider:  [name], MD Primary Cardiologist:  Johnny Bridge, MD / S. [name], MD   Patient Profile  80 y/o male with a h/o atypical c/p and PAF who presents today 2/2 recurrent atypical c/p.  Problem List   Past Medical History  Diagnosis Date  . GERD (gastroesophageal reflux disease)   . IBS (irritable bowel syndrome)   . Diverticulosis   . Hypertension   . Nonischemic cardiomyopathy     a. EF 45% by echo 2010;  b. 05/2012 Echo: EF 55-65%, no rwma, Gr 1 DD, mild AI/MR, mildly dil LA.  Marland Kitchen Atypical chest pain     a. Nonobstructive, very minor irregs by cath 2007;  b. 06/2009 Ex MV: small area of apical lateral ischemia;  c. 06/2009 Cath: essentially normal  . Paroxysmal atrial fibrillation     a. Treated with flecainide  . Chronic anticoagulation     a. Chronic Coumadin  . Paralyzed vocal cords   . Chronic anxiety   . Proteinuria     History of  . Skin cancer    Past Surgical History  Procedure Laterality Date  . Cardiac catheterization    . Mass biopsy  2013  . Cataract surgery Bilateral     Allergies  Allergies  Allergen Reactions  . Lanoxin [Digoxin]   . Lopressor [Metoprolol Tartrate]   . Sulfonamide Derivatives     Rash   . Verapamil     HPI  80 y/o male with a h/o intermittent chest pain dating back at least to 2007 at which time he had nonobs dzs on a cath.  In 2010, he had more c/p and had a mildly abnl MV followed by cath revealing relatively nl cors.  He also has a h/o PAF and is maintained on flecainide and coumadin.  He has not had any recent paroxysms of Afib.  In October he reported mild atypical chest discomfort and was conservatively managed.  By the time he f/u with Dr. [name] in November, it had resolved.  He says that he had done well since then but on 5/2, he began to experience intermittent rest and exertional chest pressure w/o associated Ss, lasting 5 mins to 1-2 hrs,  and resolving spontaneously.  Ss are not worsened by exertion nor do they limit his activities.  Pain is not any worse with deep breathing, palpation, or position changes.  He's continued to have intermittent Ss since Saturday.  He thinks that it's occurring b/c he's nervous about an epidural injection that he is scheduled to receive today for low back pain.  ECG is non-acute.  He is currently pain free.  Chest pain is similar in character to previous episodes of c/p in 2010.  Home Medications  Prior to Admission medications   Medication Sig Start Date End Date Taking? Authorizing Provider  ALPRAZolam (XANAX) 0.25 MG tablet Take 0.25 mg by mouth. Three times a week   Yes Historical Provider, MD  Cholecalciferol (VITAMIN D3) 400 UNITS CAPS Take 1 capsule by mouth daily.     Yes Historical Provider, MD  co-enzyme Q-10 30 MG capsule Take 30 mg by mouth daily.     Yes Historical Provider, MD  Coenzyme Q10 50 MG CAPS Take 1 capsule by mouth daily.     Yes Historical Provider, MD  Cyanocobalamin (VITAMIN B-12 PO) Take 200 mg by mouth daily.   Yes Historical Provider, MD  Flaxseed, Linseed, (FLAX SEED OIL)  1000 MG CAPS Take 1 capsule by mouth daily.     Yes Historical Provider, MD  flecainide (TAMBOCOR) 50 MG tablet TAKE 1 & 1/2 TABLETS (75 MG TOTAL) TWICE DAILY 12/13/13  Yes Deboraha Sprang, MD  GABAPENTIN, PHN, PO Take 200 mg by mouth daily.    Yes Historical Provider, MD  MAGNESIUM CHLORIDE ER PO Take 325 mg by mouth daily.   Yes Historical Provider, MD  Multiple Vitamin (MULTIVITAMIN) tablet Take 1 tablet by mouth daily.     Yes Historical Provider, MD  Omega-3 Fatty Acids (FISH OIL) 1000 MG CAPS Take 1 capsule by mouth daily.     Yes Historical Provider, MD  pantoprazole (PROTONIX) 40 MG tablet Take 40 mg by mouth.    Yes Historical Provider, MD  ranitidine (ZANTAC) 150 MG tablet Take 150 mg by mouth daily.   Yes Historical Provider, MD  Tamsulosin HCl (FLOMAX) 0.4 MG CAPS Take 0.4 mg by mouth daily.      Yes Historical Provider, MD  valsartan (DIOVAN) 80 MG tablet Take 80 mg by mouth daily.   Yes Historical Provider, MD  warfarin (COUMADIN) 2.5 MG tablet Take 2.5 mg by mouth daily.    Yes Historical Provider, MD  nitroGLYCERIN (NITROSTAT) 0.4 MG SL tablet Place 1 tablet (0.4 mg total) under the tongue every 5 (five) minutes as needed for chest pain. 04/24/14   Rogelia Mire, NP    Review of Systems  Chest pain as above.  He denies palpitations, dyspnea, pnd, orthopnea, n, v, dizziness, syncope, edema, weight gain, or early satiety.  All other systems reviewed and are otherwise negative except as noted above.  Physical Exam  Blood pressure 142/90, pulse 60, height 5\' 11"  (1.803 m), weight 196 lb 4 oz (89.018 kg).  General: Pleasant, NAD Psych: Normal affect. Neuro: Alert and oriented X 3. Moves all extremities spontaneously. HEENT: Normal  Neck: Supple without bruits or JVD. Lungs:  Resp regular and unlabored, CTA. Heart: RRR no s3, s4, or murmurs. Abdomen: Soft, non-tender, non-distended, BS + x 4.  Extremities: No clubbing, cyanosis or edema. DP/PT/Radials 2+ and equal bilaterally.  Accessory Clinical Findings  ECG - RSR, 60, no acute ST/T changes.  Assessment & Plan  1.  Midsternal chest pain:  Pt presents with a 4 day h/o intermittent chest discomfort w/o associated Ss.  Discomfort is similar to what he experienced in 2010, at which time his cath showed nonobs dzs.  It's been 5 yrs since his last stress test.  We will arrange for a lexiscan myoview to r/o ischemia.  He was unable to achieve a target HR on his last stress, which is why I've chosen lexiscan this time.  We have sent in a rx for sl ntg to his pharmacy.  2.  HTN:  BP elevated in clinic today but says that it was 128/78 this morning @ home and that it generally runs in the 120's @ home.  Cont ARB.  No changes today.  3.  PAF:  In sinus.  On flecainide and chronic coumadin.  4.  Dispo:  Myoview to r/o ischemia  as above.  F/u in 3-4 wks or sooner if necessary.  Rogelia Mire, NP 04/24/2014, 2:06 PM

## 2014-05-01 ENCOUNTER — Telehealth: Payer: Self-pay | Admitting: *Deleted

## 2014-05-01 ENCOUNTER — Ambulatory Visit: Payer: Self-pay | Admitting: Cardiovascular Disease

## 2014-05-01 DIAGNOSIS — R079 Chest pain, unspecified: Secondary | ICD-10-CM

## 2014-05-01 NOTE — Telephone Encounter (Signed)
Spoke w/ pt.  Advised him that I spoke w/ Dr. Rockey Situ about his symptoms and assured pt that the med for lexiscan was out of his system in 8 mins and longer affecting him. Dr. Rockey Situ believes sx are most like r/t anxiety.  Advised pt to relax and not worry about results of test. Advised him that we do not have results yet, but will call him when lexiscan has been interpreted.  He is agreeable and states "my arms are probably tingly b/c of my blood pressure" and states that he will probably take a walk.

## 2014-05-01 NOTE — Telephone Encounter (Signed)
Nuclear stress test today at [place].  Arms tingly and bp   180/90

## 2014-05-02 ENCOUNTER — Other Ambulatory Visit: Payer: Self-pay

## 2014-05-02 DIAGNOSIS — R0789 Other chest pain: Secondary | ICD-10-CM

## 2014-05-02 DIAGNOSIS — I10 Essential (primary) hypertension: Secondary | ICD-10-CM

## 2014-05-02 DIAGNOSIS — I480 Paroxysmal atrial fibrillation: Secondary | ICD-10-CM

## 2014-05-02 DIAGNOSIS — K219 Gastro-esophageal reflux disease without esophagitis: Secondary | ICD-10-CM

## 2014-05-02 NOTE — Progress Notes (Signed)
New 5/13

## 2014-05-18 ENCOUNTER — Encounter: Payer: Self-pay | Admitting: Cardiovascular Disease

## 2014-05-18 ENCOUNTER — Ambulatory Visit (INDEPENDENT_AMBULATORY_CARE_PROVIDER_SITE_OTHER): Payer: Medicare Other | Admitting: Cardiovascular Disease

## 2014-05-18 VITALS — BP 122/90 | HR 69 | Ht 70.0 in | Wt 195.8 lb

## 2014-05-18 DIAGNOSIS — I4891 Unspecified atrial fibrillation: Secondary | ICD-10-CM

## 2014-05-18 DIAGNOSIS — R0789 Other chest pain: Secondary | ICD-10-CM

## 2014-05-18 DIAGNOSIS — I10 Essential (primary) hypertension: Secondary | ICD-10-CM

## 2014-05-18 NOTE — Progress Notes (Signed)
Patient ID: Robert Burch, male    DOB: Aug 24, 1933, 80 y.o.   MRN: [id number]  HPI Comments: 80 yo with history of paroxysmal atrial fibrillation, h/o chest pain, catheterization in 2007 showing nonobstructive CAD, Presenting for routine followup.  In followup today, he reports that he is doing well. He denies any significant symptoms of chest pain. He is walking a regular basis, swimming with no symptoms of angina. Prior chest pain symptoms with stress test several weeks ago showing no ischemia. Normal ejection fraction.  He feels that his symptoms were from decreasing his Protonix. Now he takes Protonix several days per week with Zantac and has good symptom control  Blood pressure has been well controlled at home Total cholesterol 150  Previously had significant GI workup for discomfort, most of which has been negative. This included an EGD, abdominal ultrasound. He does take Tylenol and gabapentin.  No recent atrial fibrillation for several years He does have occasional palpitations  He does have a chronic issue with sweats that seem to come on, unprovoked. Etiology is uncertain. It is not clear how much Xanax he is taking though he does report having a long history of anxiety.  Recent blood work shows her cholesterol 162 LDL 100, HDL 40      Outpatient Encounter Prescriptions as of 05/18/2014  Medication Sig  . ALPRAZolam (XANAX) 0.25 MG tablet Take 0.25 mg by mouth. Three times a week  . Cholecalciferol (VITAMIN D3) 400 UNITS CAPS Take 1 capsule by mouth daily.    Marland Kitchen co-enzyme Q-10 30 MG capsule Take 30 mg by mouth daily.    . Coenzyme Q10 50 MG CAPS Take 1 capsule by mouth daily.    . Cyanocobalamin (VITAMIN B-12 PO) Take 200 mg by mouth daily.  . Flaxseed, Linseed, (FLAX SEED OIL) 1000 MG CAPS Take 1 capsule by mouth daily.    . flecainide (TAMBOCOR) 50 MG tablet TAKE 1 & 1/2 TABLETS (75 MG TOTAL) TWICE DAILY  . GABAPENTIN, PHN, PO Take 200 mg by mouth daily.   Marland Kitchen  MAGNESIUM CHLORIDE ER PO Take 325 mg by mouth daily.  . Multiple Vitamin (MULTIVITAMIN) tablet Take 1 tablet by mouth daily.    . nitroGLYCERIN (NITROSTAT) 0.4 MG SL tablet Place 1 tablet (0.4 mg total) under the tongue every 5 (five) minutes as needed for chest pain.  . Omega-3 Fatty Acids (FISH OIL) 1000 MG CAPS Take 1 capsule by mouth daily.    . pantoprazole (PROTONIX) 40 MG tablet Take 40 mg by mouth.   . ranitidine (ZANTAC) 150 MG tablet Take 150 mg by mouth daily.  . Tamsulosin HCl (FLOMAX) 0.4 MG CAPS Take 0.4 mg by mouth daily.    . valsartan (DIOVAN) 80 MG tablet Take 80 mg by mouth daily.  Marland Kitchen warfarin (COUMADIN) 2.5 MG tablet Take 2.5 mg by mouth daily.     Review of Systems  Constitutional: Negative.   HENT: Negative.   Eyes: Negative.   Respiratory: Negative.   Cardiovascular: Negative.   Gastrointestinal: Negative.   Endocrine: Negative.   Musculoskeletal: Positive for myalgias.  Skin: Negative.   Allergic/Immunologic: Negative.   Neurological: Negative.        Sweats  Hematological: Negative.   Psychiatric/Behavioral: Negative.   All other systems reviewed and are negative.   BP 122/90  Pulse 69  Ht 5\' 10"  (1.778 m)  Wt 195 lb 12 oz (88.792 kg)  BMI 28.09 kg/m2  Physical Exam  Nursing note and vitals reviewed. Constitutional:  He is oriented to person, place, and time. He appears well-developed and well-nourished.  HENT:  Head: Normocephalic.  Nose: Nose normal.  Mouth/Throat: Oropharynx is clear and moist.  Eyes: Conjunctivae are normal. Pupils are equal, round, and reactive to light.  Neck: Normal range of motion. Neck supple. No JVD present.  Cardiovascular: Normal rate, regular rhythm, S1 normal, S2 normal, normal heart sounds and intact distal pulses.  Exam reveals no gallop and no friction rub.   No murmur heard. Pulmonary/Chest: Effort normal and breath sounds normal. No respiratory distress. He has no wheezes. He has no rales. He exhibits no  tenderness.  Abdominal: Soft. Bowel sounds are normal. He exhibits no distension. There is no tenderness.  Musculoskeletal: Normal range of motion. He exhibits no edema and no tenderness.  Lymphadenopathy:    He has no cervical adenopathy.  Neurological: He is alert and oriented to person, place, and time. Coordination normal.  Skin: Skin is warm and dry. No rash noted. No erythema.  Psychiatric: He has a normal mood and affect. His behavior is normal. Judgment and thought content normal.      Assessment and Plan

## 2014-05-18 NOTE — Patient Instructions (Addendum)
You are doing well. No medication changes were made.  Please call us if you have new issues that need to be addressed before your next appt.  Your physician wants you to follow-up in: 6 months.  You will receive a reminder letter in the mail two months in advance. If you don't receive a letter, please call our office to schedule the follow-up appointment.   

## 2014-05-18 NOTE — Assessment & Plan Note (Signed)
Maintaining normal sinus rhythm. No changes to his medications. 

## 2014-05-18 NOTE — Assessment & Plan Note (Signed)
Recent negative stress test. No further workup at this time

## 2014-05-18 NOTE — Assessment & Plan Note (Signed)
Blood pressure is well controlled on today's visit. No changes made to the medications. 

## 2014-05-22 DIAGNOSIS — M51369 Other intervertebral disc degeneration, lumbar region without mention of lumbar back pain or lower extremity pain: Secondary | ICD-10-CM | POA: Insufficient documentation

## 2014-05-22 DIAGNOSIS — M48062 Spinal stenosis, lumbar region with neurogenic claudication: Secondary | ICD-10-CM | POA: Insufficient documentation

## 2014-05-22 DIAGNOSIS — M5136 Other intervertebral disc degeneration, lumbar region: Secondary | ICD-10-CM | POA: Insufficient documentation

## 2014-05-22 DIAGNOSIS — M5412 Radiculopathy, cervical region: Secondary | ICD-10-CM | POA: Insufficient documentation

## 2014-06-10 ENCOUNTER — Other Ambulatory Visit: Payer: Self-pay | Admitting: Internal Medicine

## 2014-07-04 ENCOUNTER — Ambulatory Visit: Payer: Self-pay | Admitting: Gastroenterology

## 2014-10-01 ENCOUNTER — Encounter: Payer: Self-pay | Admitting: Nurse Practitioner

## 2014-10-01 ENCOUNTER — Ambulatory Visit (INDEPENDENT_AMBULATORY_CARE_PROVIDER_SITE_OTHER): Payer: Medicare Other | Admitting: Nurse Practitioner

## 2014-10-01 ENCOUNTER — Telehealth: Payer: Self-pay

## 2014-10-01 VITALS — BP 140/90 | HR 57 | Ht 70.0 in | Wt 193.2 lb

## 2014-10-01 DIAGNOSIS — R0789 Other chest pain: Secondary | ICD-10-CM

## 2014-10-01 NOTE — Progress Notes (Signed)
Patient Name: Robert Burch Date of Encounter: 10/01/2014  Primary Care Provider:  [name], MD Primary Cardiologist:  Johnny Bridge, MD   Patient Profile  80 year old male with a history of atypical chest pain and prior normal catheterization and stress testing, who presents with recurrent atypical chest pain.  Problem List   Past Medical History  Diagnosis Date  . GERD (gastroesophageal reflux disease)   . IBS (irritable bowel syndrome)   . Diverticulosis   . Hypertension   . Nonischemic cardiomyopathy     a. EF 45% by echo 2010;  b. 05/2012 Echo: EF 55-65%, no rwma, Gr 1 DD, mild AI/MR, mildly dil LA.  Marland Kitchen Atypical chest pain     a. Nonobstructive, very minor irregs by cath 2007;  b. 06/2009 Ex MV: small area of apical lateral ischemia;  c. 06/2009 Cath: essentially normal;  d. 04/2014 Lexiscan MV: EF 59%, no ischemia/infarct->low risk.  . Paroxysmal atrial fibrillation     a. Treated with flecainide  . Chronic anticoagulation     a. Chronic Coumadin  . Paralyzed vocal cords   . Chronic anxiety   . Proteinuria     History of  . Skin cancer    Past Surgical History  Procedure Laterality Date  . Cardiac catheterization    . Mass biopsy  2013  . Cataract surgery Bilateral     Allergies  Allergies  Allergen Reactions  . Lanoxin [Digoxin]   . Lopressor [Metoprolol Tartrate]   . Sulfonamide Derivatives     Rash   . Verapamil     HPI  80 year old male with the above problem list.  He does have a history of atypical chest pain and is status post 2 prior catheterizations, the last of which in 2010.  He also had a stress test in May of this year, which was nonischemic.  He's done well since then and remains very active but since last Thursday, has been having intermittent, diffuse, right and left sided chest aching with occasional midsternal tightness, occurring exclusively at rest, w/o assoc Ss, lasting ~ 45 mins, and resolving spontaneously.  He is able to walk and  carry out his usual activities w/o Ss.  Chest pain is similar to prior episodes.  He denies palpitations, dyspnea, pnd, orthopnea, n, v, dizziness, syncope, edema, weight gain, or early satiety.   Home Medications  Prior to Admission medications   Medication Sig Start Date End Date Taking? Authorizing Provider  ALPRAZolam (XANAX) 0.25 MG tablet Take 0.25 mg by mouth. Three times a week   Yes Historical Provider, MD  Cholecalciferol (VITAMIN D3) 400 UNITS CAPS Take 1 capsule by mouth daily.     Yes Historical Provider, MD  co-enzyme Q-10 30 MG capsule Take 30 mg by mouth daily.     Yes Historical Provider, MD  Coenzyme Q10 50 MG CAPS Take 1 capsule by mouth daily.     Yes Historical Provider, MD  Cyanocobalamin (VITAMIN B-12 PO) Take 200 mg by mouth daily.   Yes Historical Provider, MD  Flaxseed, Linseed, (FLAX SEED OIL) 1000 MG CAPS Take 1 capsule by mouth daily.     Yes Historical Provider, MD  flecainide (TAMBOCOR) 50 MG tablet TAKE 1 & 1/2 TABLETS (75 MG TOTAL) TWICE DAILY   Yes Minna Merritts, MD  GABAPENTIN, PHN, PO Take 200 mg by mouth daily.    Yes Historical Provider, MD  MAGNESIUM CHLORIDE ER PO Take 325 mg by mouth daily.   Yes Historical Provider, MD  Multiple Vitamin (MULTIVITAMIN) tablet Take 1 tablet by mouth daily.     Yes Historical Provider, MD  nitroGLYCERIN (NITROSTAT) 0.4 MG SL tablet Place 1 tablet (0.4 mg total) under the tongue every 5 (five) minutes as needed for chest pain. 04/24/14  Yes Rogelia Mire, NP  Omega-3 Fatty Acids (FISH OIL) 1000 MG CAPS Take 1 capsule by mouth daily.     Yes Historical Provider, MD  pantoprazole (PROTONIX) 40 MG tablet Take 40 mg by mouth.    Yes Historical Provider, MD  ranitidine (ZANTAC) 150 MG tablet Take 150 mg by mouth daily.   Yes Historical Provider, MD  Tamsulosin HCl (FLOMAX) 0.4 MG CAPS Take 0.4 mg by mouth daily.     Yes Historical Provider, MD  valsartan (DIOVAN) 40 MG tablet Take 40 mg by mouth daily.   Yes Historical  Provider, MD  warfarin (COUMADIN) 2.5 MG tablet Take 2.5 mg by mouth daily.    Yes Historical Provider, MD    Review of Systems  Chest pain as above.  All other systems reviewed and are otherwise negative except as noted above.  Physical Exam  Blood pressure 140/90, pulse 57, height 5\' 10"  (1.778 m), weight 193 lb 4 oz (87.658 kg).  General: Pleasant, NAD Psych: Normal affect. Neuro: Alert and oriented X 3. Moves all extremities spontaneously. HEENT: Normal  Neck: Supple without bruits or JVD. Lungs:  Resp regular and unlabored, CTA. Heart: RRR no s3, s4, or murmurs. Abdomen: Soft, non-tender, non-distended, BS + x 4.  Extremities: No clubbing, cyanosis or edema. DP/PT/Radials 2+ and equal bilaterally.  Accessory Clinical Findings  ECG - sb, 57, no acute st/t changes.  Assessment & Plan  1.  Atypical chest pain:  Pt has a h/o atypical c/p with neg MV most recently in May of this year.  He has had recurrent, intermittent, c/p since last week that is similar to prior episodes.  ECG is w/o acute changes.  Given neg MV in May, I will defer w/u @ this time.  He will see how it goes over the next few wks and if he continues to have c/p, he will contact us prior to his appt with Dr. Rockey Situ on 11/24.  If he has ongoing c/p, we could consider repeat Cardiac CTA (last done in 2010 with motion artifact leading to cath) vs. Cath.  He has prn ntg and is also on ppi/H2 blocker.  2.  PAF:  In sinus on flecainide and coumadin.  3.  HTN:  BP runs 140 @ home, which it is today.  He is aware of most recent guidelines re: BP mgmt in the elderly.  Cont current dose of ARB.  4.  H/O NICM:  Nl Ef by last echo and myoview in may.  5.  Dispo:  F/U with Dr. Rockey Situ in Nov as scheduled or sooner if necessary.   Murray Hodgkins, NP 10/01/2014, 3:56 PM

## 2014-10-01 NOTE — Telephone Encounter (Signed)
Spoke w/ pt.  He reports chest discomfort since Thursday. Describes it as an aching sensation across his chest, that comes and goes.  Reports that sx are better w/ rest, but do not necessarily worsen w/ exertion. Reports that at times, it will radiate to both arms. Denies nausea, sweating or diaphoresis.  Pt sched to see Ignacia Bayley, NP this afternoon.  Pt verbalizes understanding that if sx become emergent, to call 911 or proceed to nearest ED.

## 2014-10-01 NOTE — Patient Instructions (Signed)
Your physician recommends that you schedule a follow-up appointment in:  Keep your follow up appt with Dr. Rockey Situ

## 2014-10-17 ENCOUNTER — Telehealth: Payer: Self-pay

## 2014-10-17 NOTE — Telephone Encounter (Signed)
Spoke w/ pt.  He reports that he was having some chest tightness previously that he associated w/ anxiety.  He is not concerned that there may be something more serious, as sx have not resolved.  He is interested in pursuing cath and would like to discuss w/ Dr. Rockey Situ. Pt sched to see Dr. Rockey Situ tomorrow at 1:45.

## 2014-10-17 NOTE — Telephone Encounter (Signed)
Pt called, states he has had cp on and off for several weeks. States they are "very light" please call.

## 2014-10-18 ENCOUNTER — Ambulatory Visit (INDEPENDENT_AMBULATORY_CARE_PROVIDER_SITE_OTHER): Payer: Medicare Other | Admitting: Cardiovascular Disease

## 2014-10-18 ENCOUNTER — Encounter: Payer: Self-pay | Admitting: Cardiovascular Disease

## 2014-10-18 VITALS — BP 120/82 | HR 59 | Ht 70.0 in | Wt 189.0 lb

## 2014-10-18 DIAGNOSIS — F419 Anxiety disorder, unspecified: Secondary | ICD-10-CM

## 2014-10-18 DIAGNOSIS — I480 Paroxysmal atrial fibrillation: Secondary | ICD-10-CM

## 2014-10-18 DIAGNOSIS — R0789 Other chest pain: Secondary | ICD-10-CM

## 2014-10-18 DIAGNOSIS — I10 Essential (primary) hypertension: Secondary | ICD-10-CM

## 2014-10-18 NOTE — Progress Notes (Signed)
Patient ID: Robert Burch, male    DOB: 12/12/1933, 80 y.o.   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  In follow-up today, he reports that over the past several weeks he has had waxing waning chest pain at rest. He describes the symptoms in his breast area bilaterally. He's able to appreciate the discomfort with palpation. He reports that his son has recently undergone valve surgery and this has made him more anxious about his symptoms. Denies having any symptoms with exertion Last stress test May 2015 showing no scheme E He typically walks on a regular basis  Previously had significant GI workup for discomfort, most of which has been negative. This included an EGD, abdominal ultrasound. He does take Tylenol and gabapentin.  No recent atrial fibrillation for several years He does have occasional palpitations  He does have a chronic issue with sweats that seem to come on, unprovoked. Etiology is uncertain. It is not clear how much Xanax he is taking though he does report having a long history of anxiety.  cholesterol 162 LDL 100, HDL 40 EKG shows normal sinus rhythm with rate 63 bpm, no significant ST or T-wave changes      Outpatient Encounter Prescriptions as of 10/18/2014  Medication Sig  . ALPRAZolam (XANAX) 0.25 MG tablet Take 0.25 mg by mouth. Three times a week  . Cholecalciferol (VITAMIN D3) 400 UNITS CAPS Take 1 capsule by mouth daily.    Marland Kitchen co-enzyme Q-10 30 MG capsule Take 30 mg by mouth daily.    . Coenzyme Q10 50 MG CAPS Take 1 capsule by mouth daily.    . Cyanocobalamin (VITAMIN B-12 PO) Take 200 mg by mouth daily.  . Flaxseed, Linseed, (FLAX SEED OIL) 1000 MG CAPS Take 1 capsule by mouth daily.    . flecainide (TAMBOCOR) 50 MG tablet TAKE 1 & 1/2 TABLETS (75 MG TOTAL) TWICE DAILY  . GABAPENTIN, PHN, PO Take 200 mg by  mouth daily.   . Magnesium 250 MG TABS Take 500 mg by mouth daily.   . Multiple Vitamin (MULTIVITAMIN) tablet Take 1 tablet by mouth daily.    . nitroGLYCERIN (NITROSTAT) 0.4 MG SL tablet Place 1 tablet (0.4 mg total) under the tongue every 5 (five) minutes as needed for chest pain.  . Omega-3 Fatty Acids (FISH OIL) 1000 MG CAPS Take 1 capsule by mouth daily.    . pantoprazole (PROTONIX) 40 MG tablet Take 40 mg by mouth.   . ranitidine (ZANTAC) 150 MG tablet Take 150 mg by mouth daily.  . Tamsulosin HCl (FLOMAX) 0.4 MG CAPS Take 0.4 mg by mouth daily.    . valsartan (DIOVAN) 40 MG tablet Take 20 mg by mouth daily.   Marland Kitchen warfarin (COUMADIN) 2.5 MG tablet Take 2.5 mg by mouth daily.     Review of Systems  Constitutional: Negative.   HENT: Negative.   Eyes: Negative.   Respiratory: Negative.   Cardiovascular: Negative.   Gastrointestinal: Negative.   Endocrine: Negative.   Musculoskeletal: Positive for myalgias.  Skin: Negative.   Allergic/Immunologic: Negative.   Neurological: Negative.        Sweats  Hematological: Negative.   Psychiatric/Behavioral: Negative.   All other systems reviewed and are negative.   BP 120/82  Pulse 59  Ht 5\' 10"  (1.778 m)  Wt 189 lb (85.73 kg)  BMI 27.12 kg/m2  Physical Exam  Nursing  note and vitals reviewed. Constitutional: He is oriented to person, place, and time. He appears well-developed and well-nourished.  HENT:  Head: Normocephalic.  Nose: Nose normal.  Mouth/Throat: Oropharynx is clear and moist.  Eyes: Conjunctivae are normal. Pupils are equal, round, and reactive to light.  Neck: Normal range of motion. Neck supple. No JVD present.  Cardiovascular: Normal rate, regular rhythm, S1 normal, S2 normal, normal heart sounds and intact distal pulses.  Exam reveals no gallop and no friction rub.   No murmur heard. Pulmonary/Chest: Effort normal and breath sounds normal. No respiratory distress. He has no wheezes. He has no rales. He exhibits  no tenderness.  Abdominal: Soft. Bowel sounds are normal. He exhibits no distension. There is no tenderness.  Musculoskeletal: Normal range of motion. He exhibits no edema and no tenderness.  Lymphadenopathy:    He has no cervical adenopathy.  Neurological: He is alert and oriented to person, place, and time. Coordination normal.  Skin: Skin is warm and dry. No rash noted. No erythema.  Psychiatric: He has a normal mood and affect. His behavior is normal. Judgment and thought content normal.      Assessment and Plan

## 2014-10-18 NOTE — Patient Instructions (Signed)
Your next appointment will be scheduled in our new office located at :  [facility]  [address] [place], [place] [address]  You are doing well. No medication changes were made.  Please call us if you have new issues that need to be addressed before your next appt.  Your physician wants you to follow-up in: 6 months.  You will receive a reminder letter in the mail two months in advance. If you don't receive a letter, please call our office to schedule the follow-up appointment.

## 2014-10-19 NOTE — Assessment & Plan Note (Signed)
I suspect he has underlying anxiety disorder. This will manifest as various symptoms.

## 2014-10-19 NOTE — Assessment & Plan Note (Addendum)
We had a long discussion today concerning his chest pain symptoms. Symptoms are Atypical in nature. Tried to reassure him. Prior stress test results discussed with him. No further testing needed at this time

## 2014-10-19 NOTE — Assessment & Plan Note (Signed)
Blood pressure is well controlled on today's visit. No changes made to the medications. 

## 2014-10-19 NOTE — Assessment & Plan Note (Signed)
Maintaining normal sinus rhythm. No medication changes made 

## 2014-11-13 ENCOUNTER — Ambulatory Visit: Payer: 59 | Admitting: Cardiovascular Disease

## 2015-01-10 ENCOUNTER — Ambulatory Visit: Payer: Self-pay | Admitting: Gastroenterology

## 2015-04-15 LAB — SURGICAL PATHOLOGY

## 2015-04-22 ENCOUNTER — Ambulatory Visit (INDEPENDENT_AMBULATORY_CARE_PROVIDER_SITE_OTHER): Payer: Medicare Other | Admitting: Cardiovascular Disease

## 2015-04-22 ENCOUNTER — Encounter: Payer: Self-pay | Admitting: Cardiovascular Disease

## 2015-04-22 VITALS — BP 122/80 | HR 64 | Ht 70.0 in | Wt 192.5 lb

## 2015-04-22 DIAGNOSIS — I480 Paroxysmal atrial fibrillation: Secondary | ICD-10-CM | POA: Diagnosis not present

## 2015-04-22 DIAGNOSIS — E785 Hyperlipidemia, unspecified: Secondary | ICD-10-CM | POA: Insufficient documentation

## 2015-04-22 DIAGNOSIS — R0789 Other chest pain: Secondary | ICD-10-CM | POA: Diagnosis not present

## 2015-04-22 DIAGNOSIS — F419 Anxiety disorder, unspecified: Secondary | ICD-10-CM | POA: Diagnosis not present

## 2015-04-22 DIAGNOSIS — I10 Essential (primary) hypertension: Secondary | ICD-10-CM

## 2015-04-22 NOTE — Assessment & Plan Note (Signed)
Atypical symptoms, no further workup at this time

## 2015-04-22 NOTE — Assessment & Plan Note (Signed)
Blood pressure is well controlled on today's visit. No changes made to the medications. 

## 2015-04-22 NOTE — Assessment & Plan Note (Signed)
Currently not on a statin. He will have routine blood work with primary care

## 2015-04-22 NOTE — Progress Notes (Addendum)
Patient ID: Robert Burch, male    DOB: 29-Nov-1933, 81 y.o.   MRN: [id number]  HPI Comments: 81 yo with history of paroxysmal atrial fibrillation, h/o chest pain, catheterization in 2007 showing nonobstructive CAD, Presenting for routine followup of his atrial fibrillation and chest pain symptoms.  In follow-up today, he continues to have rare episodes of chest pain. Typically this happens at rest, not with exertion. He is otherwise active, goes swimming 3 days per week, walks daily typically with no symptoms of shortness of breath or chest pain. Chest pain symptoms are very short, on the left side, typically massages his chest and they will resolve Otherwise denies any new symptoms. Denies any atrial fibrillation episodes  He has diltiazem for breakthrough atrial fibrillation  EKG on today's visit shows normal sinus rhythm with rate 65 bpm, no significant ST or T-wave changes   Other past medical history son went through  valve surgery in 2015 , this made him nervous about his own health Last stress test May 2015 showing  no ischemia  Previously had significant GI workup for discomfort, most of which has been negative. This included an EGD, abdominal ultrasound. He does take Tylenol and gabapentin.  chronic issue with sweats that seem to come on, unprovoked. Etiology is uncertain. It is not clear how much Xanax he is taking though he does report having a long history of anxiety.  Old lab work, cholesterol 162 LDL 100, HDL 40       Allergies  Allergen Reactions  . Lanoxin [Digoxin]   . Lopressor [Metoprolol Tartrate]   . Sulfonamide Derivatives     Rash   . Verapamil     Current Outpatient Prescriptions on File Prior to Visit  Medication Sig Dispense Refill  . ALPRAZolam (XANAX) 0.25 MG tablet Take 0.25 mg by mouth. Three times a week    . Cholecalciferol (VITAMIN D3) 400 UNITS CAPS Take 1 capsule by mouth daily.      Marland Kitchen co-enzyme Q-10 30 MG capsule Take 30 mg by  mouth daily.      . Coenzyme Q10 50 MG CAPS Take 1 capsule by mouth daily.      . Cyanocobalamin (VITAMIN B-12 PO) Take 200 mg by mouth daily.    . Flaxseed, Linseed, (FLAX SEED OIL) 1000 MG CAPS Take 1 capsule by mouth daily.      . flecainide (TAMBOCOR) 50 MG tablet TAKE 1 & 1/2 TABLETS (75 MG TOTAL) TWICE DAILY 270 tablet 3  . GABAPENTIN, PHN, PO Take 200 mg by mouth daily.     . Magnesium 250 MG TABS Take 500 mg by mouth daily.     . Multiple Vitamin (MULTIVITAMIN) tablet Take 1 tablet by mouth daily.      . nitroGLYCERIN (NITROSTAT) 0.4 MG SL tablet Place 1 tablet (0.4 mg total) under the tongue every 5 (five) minutes as needed for chest pain. 25 tablet 6  . Omega-3 Fatty Acids (FISH OIL) 1000 MG CAPS Take 1 capsule by mouth daily.      . pantoprazole (PROTONIX) 40 MG tablet Take 40 mg by mouth.     . ranitidine (ZANTAC) 150 MG tablet Take 150 mg by mouth daily.    . Tamsulosin HCl (FLOMAX) 0.4 MG CAPS Take 0.4 mg by mouth 2 (two) times daily.     . valsartan (DIOVAN) 40 MG tablet Take 20 mg by mouth daily.     Marland Kitchen warfarin (COUMADIN) 2.5 MG tablet Take 2.5 mg by mouth daily.  No current facility-administered medications on file prior to visit.    Past Medical History  Diagnosis Date  . GERD (gastroesophageal reflux disease)   . IBS (irritable bowel syndrome)   . Diverticulosis   . Hypertension   . Nonischemic cardiomyopathy     a. EF 45% by echo 2010;  b. 05/2012 Echo: EF 55-65%, no rwma, Gr 1 DD, mild AI/MR, mildly dil LA.  Marland Kitchen Atypical chest pain     a. Nonobstructive, very minor irregs by cath 2007;  b. 06/2009 Ex MV: small area of apical lateral ischemia;  c. 06/2009 Cath: essentially normal;  d. 04/2014 Lexiscan MV: EF 59%, no ischemia/infarct->low risk.  . Paroxysmal atrial fibrillation     a. Treated with flecainide  . Chronic anticoagulation     a. Chronic Coumadin  . Paralyzed vocal cords   . Chronic anxiety   . Proteinuria     History of  . Skin cancer     Past  Surgical History  Procedure Laterality Date  . Cardiac catheterization    . Mass biopsy  2013  . Cataract surgery Bilateral     Social History  reports that he has never smoked. He has never used smokeless tobacco. He reports that he drinks about 1.1 oz of alcohol per week. He reports that he does not use illicit drugs.  Family History Family history is unknown by patient.     Review of Systems  Constitutional: Negative.   Respiratory: Negative.   Cardiovascular: Negative.   Gastrointestinal: Negative.   Musculoskeletal: Positive for myalgias.  Skin: Negative.   Neurological: Negative.        Sweats  Hematological: Negative.   Psychiatric/Behavioral: Negative.   All other systems reviewed and are negative.   BP 122/80 mmHg  Pulse 64  Ht 5\' 10"  (1.778 m)  Wt 192 lb 8 oz (87.317 kg)  BMI 27.62 kg/m2  Physical Exam  Constitutional: He is oriented to person, place, and time. He appears well-developed and well-nourished.  HENT:  Head: Normocephalic.  Nose: Nose normal.  Mouth/Throat: Oropharynx is clear and moist.  Eyes: Conjunctivae are normal. Pupils are equal, round, and reactive to light.  Neck: Normal range of motion. Neck supple. No JVD present.  Cardiovascular: Normal rate, regular rhythm, S1 normal, S2 normal, normal heart sounds and intact distal pulses.  Exam reveals no gallop and no friction rub.   No murmur heard. Pulmonary/Chest: Effort normal and breath sounds normal. No respiratory distress. He has no wheezes. He has no rales. He exhibits no tenderness.  Abdominal: Soft. Bowel sounds are normal. He exhibits no distension. There is no tenderness.  Musculoskeletal: Normal range of motion. He exhibits no edema or tenderness.  Lymphadenopathy:    He has no cervical adenopathy.  Neurological: He is alert and oriented to person, place, and time. Coordination normal.  Skin: Skin is warm and dry. No rash noted. No erythema.  Psychiatric: He has a normal mood and  affect. His behavior is normal. Judgment and thought content normal.      Assessment and Plan   Nursing note and vitals reviewed.

## 2015-04-22 NOTE — Patient Instructions (Addendum)
You are doing well. No medication changes were made.  If you have atrial fibrillation, Take an extra extra flecainide pill (if you have not taken your regular Am and PM pills, take flecainde 100 mg x 1)  Ok to take diltiazem as well  Please call us if you have new issues that need to be addressed before your next appt.  Your physician wants you to follow-up in: 12 months.  You will receive a reminder letter in the mail two months in advance. If you don't receive a letter, please call our office to schedule the follow-up appointment.

## 2015-04-22 NOTE — Assessment & Plan Note (Signed)
Anxiety seems to be better controlled on today's visit. His son is doing well following valve surgery

## 2015-04-22 NOTE — Assessment & Plan Note (Signed)
No recent arrhythmia per the patient, recommended he take extra flecainide and diltiazem for any breakthrough arrhythmia

## 2015-05-30 ENCOUNTER — Telehealth: Payer: Self-pay | Admitting: *Deleted

## 2015-05-30 NOTE — Telephone Encounter (Signed)
Pt c/o of Chest Pain: STAT if CP now or developed within 24 hours  1. Are you having CP right now? No, just discomfort   2. Are you experiencing any other symptoms (ex. SOB, nausea, vomiting, sweating)? No just the discomfort  3. How long have you been experiencing CP? Two days   4. Is your CP continuous or coming and going? Coming and going, can do mostly everything but just wants to be sure  5. Have you taken Nitroglycerin? No has not.  ?  Pt states it is not chest pains it is just comfort.

## 2015-05-30 NOTE — Telephone Encounter (Signed)
S/w patient who indicates he is having chest discomfort on left side of chest sometimes under left breast, then right side for about six months. Office visit with Dr. Rockey Situ May 2016. States atypical chest pain. Denies radiating pain, sweating, nausea, vomiting or being short of breath. Often relieved by antacid. States he is able to continue with normal activities. Suggested he follow up with PCP. Pt verbalized understanding with no further questions.

## 2015-06-26 ENCOUNTER — Other Ambulatory Visit: Payer: Self-pay | Admitting: *Deleted

## 2015-06-26 MED ORDER — FLECAINIDE ACETATE 50 MG PO TABS: ORAL_TABLET | ORAL | Status: DC

## 2015-06-27 ENCOUNTER — Telehealth: Payer: Self-pay | Admitting: *Deleted

## 2015-06-27 NOTE — Telephone Encounter (Signed)
Spoke w/ pt.  He reports an achiness that "circles his chest and sometimes in the center. " Reports that he has seen Dr. Lovie Macadamia 3 x and all of his EKGs have been "fine".  He would like for our office to look at this, as we have all of his previous EKGs to compare to.  Reports that sensation is like "heavy indigestion" and he is trying to set up appt w/ GI. Reports that the intensity of cp changes, he has been taking Tylenol & Mylanta. Reports that heat seems to help.  Pt sched to see Christell Faith, PA on Mon 7/11.

## 2015-06-27 NOTE — Telephone Encounter (Signed)
Pt calling stating he is having chest discomfort.  Got achy ness and around whole breast area. Going on for about 2 or 3w  Has only taken normal medications   Pt c/o of Chest Pain: STAT if CP now or developed within 24 hours  1. Are you having CP right now? Says its achy ness not pain   2. Are you experiencing any other symptoms (ex. SOB, nausea, vomiting, sweating)? No   3. How long have you been experiencing CP? 2-3 w   4. Is your CP continuous or coming and going? Comes and goes   5. Have you taken Nitroglycerin? Has some but was not sure if needed it.  ?

## 2015-07-01 ENCOUNTER — Encounter: Payer: Self-pay | Admitting: Physician Assistant

## 2015-07-01 ENCOUNTER — Ambulatory Visit (INDEPENDENT_AMBULATORY_CARE_PROVIDER_SITE_OTHER): Payer: Medicare Other | Admitting: Physician Assistant

## 2015-07-01 VITALS — BP 134/86 | HR 59 | Ht 69.0 in | Wt 188.0 lb

## 2015-07-01 DIAGNOSIS — I480 Paroxysmal atrial fibrillation: Secondary | ICD-10-CM

## 2015-07-01 DIAGNOSIS — I10 Essential (primary) hypertension: Secondary | ICD-10-CM

## 2015-07-01 DIAGNOSIS — R079 Chest pain, unspecified: Secondary | ICD-10-CM

## 2015-07-01 DIAGNOSIS — R0789 Other chest pain: Secondary | ICD-10-CM | POA: Diagnosis not present

## 2015-07-01 DIAGNOSIS — F419 Anxiety disorder, unspecified: Secondary | ICD-10-CM

## 2015-07-01 DIAGNOSIS — I429 Cardiomyopathy, unspecified: Secondary | ICD-10-CM

## 2015-07-01 DIAGNOSIS — I428 Other cardiomyopathies: Secondary | ICD-10-CM

## 2015-07-01 NOTE — Patient Instructions (Addendum)
Medication Instructions:  Please continue current medications  Labwork: None  Testing/Procedures: Your physician has requested that you have an echocardiogram. Echocardiography is a painless test that uses sound waves to create images of your heart. It provides your doctor with information about the size and shape of your heart and how well your heart's chambers and valves are working. This procedure takes approximately one hour. There are no restrictions for this procedure.  [place]  Your caregiver has ordered a Stress Test with nuclear imaging. The purpose of this test is to evaluate the blood supply to your heart muscle. This procedure is referred to as a "Non-Invasive Stress Test." This is because other than having an IV started in your vein, nothing is inserted or "invades" your body. Cardiac stress tests are done to find areas of poor blood flow to the heart by determining the extent of coronary artery disease (CAD). Some patients exercise on a treadmill, which naturally increases the blood flow to your heart, while others who are  unable to walk on a treadmill due to physical limitations have a pharmacologic/chemical stress agent called Lexiscan . This medicine will mimic walking on a treadmill by temporarily increasing your coronary blood flow.   Please note: these test may take anywhere between 2-4 hours to complete  PLEASE REPORT TO [place] AT THE FIRST DESK WILL DIRECT YOU WHERE TO GO  Date of Procedure:________Tuesday, Jul 12______  Arrival Time for Procedure:____9:15 am___________  How to prepare for your Myoview test:   Do not eat or drink after midnight  No caffeine for 24 hours prior to test  No smoking 24 hours prior to test.  Your medication may be taken with water.  If your doctor stopped a medication because of this test, do not take that medication.  Ladies, please do not wear dresses.  Skirts or pants are appropriate.  Please wear a short sleeve shirt.  No perfume, cologne or lotion.  Follow-Up: 6 months w/ Dr. Rockey Situ  Any Other Special Instructions Will Be Listed Below (If Applicable).  Adenosine Stress Electrocardiography An adenosine stress electrocardiography is a test used to detect heart disease (coronary artery disease). Adenosine is a medicine that makes the heart arteries react as if you are exercising. Adenosine is given with a radioactive tracer. The "tracer" is a safe radioactive substance that travels in the bloodstream to the heart arteries. Special imaging cameras detect the tracer and help find blocked arteries in the heart. This test may be done with or without treadmill exercise.  LET [facility] CARE PROVIDER KNOW ABOUT:  Allergies, including latex allergies.  All prescription medicines you taking as well as all non-prescription and over-the-counter medicines, including herbs and vitamins.  Use of steroids (by mouth or creams).  Previous problems with anesthetics or novocaine.  History of blood clots or bleeding problems.  Previous surgery.  Other health problems such as kidney or lung conditions.  Possibility of pregnancy, if this applies. RISKS AND COMPLICATIONS You may develop chest discomfort, shortness of breath, sweating, or light-headedness during the test. On rare occasions, you could experience a heart attack or your heart may go into a very fast or irregular rhythm. This could cause you to collapse. To ensure your safety, your health care provider will supervise the test. Your blood pressure and electrocardiogram are constantly watched. The test team watches for and is able to treat any problems. BEFORE THE PROCEDURE  Do not eat or drink caffeine for 12  to 24 hours before the test. This includes all caffeinated beverages and food, such as pop, coffee (roasted, instant, decaffeinated roasted, decaffeinated instant), hot chocolate, tea, and all chocolate.  Do not smoke  on the day of your test. Smoking on the day of your test may change your test results.  Do not eat anything 3 hours before the test or as recommended by your health care provider. Eating may cause an unclear image and may also cause nausea. If you are diabetic, talk to your health care provider regarding your insulin coverage.  Bring a list of all the medicines you are taking. Take your medicine as usual before the test except as told by the testing center.  Wear comfortable clothing, such as a short-sleeve shirt and sweatpants. Do not wear an underwire bra or jewelry. A hospital gown can be provided.  Shower before your appointment to reduce the spread of bacteria.  You may want to bring a book to read because there are some waiting periods during the test.  Your health care provider will go over the adenosine stress test with you, such as procedure protocol, what to expect, how long it will take, and results. PROCEDURE   An IV will be started in a vein in your hand or arm.  Electrode patches will be placed on your chest. The electrodes are connected to a monitor so your heart rhythm and heart rate can be watched. Your blood pressure will also be monitored during the test.  Two sets of images are usually taken of your heart. The images compare your heart at rest and when it is "stressed." This first image is a "resting" picture of your heart. The "resting" image is usually done before adenosine is given.  Adenosine is given in the IV over a period of 4 to 6 minutes.  After the adenosine is given, you will be monitored for a few minutes afterward to ensure your heart rate, heart rhythm, and blood pressure are normal. AFTER THE PROCEDURE  When your test is completed, you may be asked to schedule an office visit with your health care provider to discuss the test results, or your health care provider may choose to call you with the results.  Document Released: 02/14/2007 Document Revised:  04/23/2014 Document Reviewed: 03/23/2012 [facility] Patient Information 2015 [place], [place]. This information is not intended to replace advice given to you by your health care provider. Make sure you discuss any questions you have with your health care provider.   Echocardiogram An echocardiogram, or echocardiography, uses sound waves (ultrasound) to produce an image of your heart. The echocardiogram is simple, painless, obtained within a short period of time, and offers valuable information to your health care provider. The images from an echocardiogram can provide information such as:  Evidence of coronary artery disease (CAD).  Heart size.  Heart muscle function.  Heart valve function.  Aneurysm detection.  Evidence of a past heart attack.  Fluid buildup around the heart.  Heart muscle thickening.  Assess heart valve function. LET [facility] CARE PROVIDER KNOW ABOUT:  Any allergies you have.  All medicines you are taking, including vitamins, herbs, eye drops, creams, and over-the-counter medicines.  Previous problems you or members of your family have had with the use of anesthetics.  Any blood disorders you have.  Previous surgeries you have had.  Medical conditions you have.  Possibility of pregnancy, if this applies. BEFORE THE PROCEDURE  No special preparation is needed. Eat and drink normally.  PROCEDURE   In order to produce an image of your heart, gel will be applied to your chest and a wand-like tool (transducer) will be moved over your chest. The gel will help transmit the sound waves from the transducer. The sound waves will harmlessly bounce off your heart to allow the heart images to be captured in real-time motion. These images will then be recorded.  You may need an IV to receive a medicine that improves the quality of the pictures. AFTER THE PROCEDURE You may return to your normal schedule including diet, activities, and medicines, unless your health  care provider tells you otherwise. Document Released: 12/04/2000 Document Revised: 04/23/2014 Document Reviewed: 08/14/2013 [facility] Patient Information 2015 [place], [place]. This information is not intended to replace advice given to you by your health care provider. Make sure you discuss any questions you have with your health care provider.

## 2015-07-02 ENCOUNTER — Encounter
Admission: RE | Admit: 2015-07-02 | Discharge: 2015-07-02 | Disposition: A | Discharge status: Home / Self Care | Payer: Medicare Other | Source: Ambulatory Visit | Attending: Physician Assistant | Admitting: Physician Assistant

## 2015-07-02 ENCOUNTER — Encounter: Payer: Self-pay | Admitting: Physician Assistant

## 2015-07-02 DIAGNOSIS — R079 Chest pain, unspecified: Secondary | ICD-10-CM

## 2015-07-02 MED ORDER — TECHNETIUM TC 99M SESTAMIBI - CARDIOLITE
13.0000 | Freq: Once | INTRAVENOUS | Status: AC | PRN
  Administered 2015-07-02: 14.236 via INTRAVENOUS

## 2015-07-02 MED ORDER — TECHNETIUM TC 99M SESTAMIBI - CARDIOLITE
30.0000 | Freq: Once | INTRAVENOUS | Status: AC | PRN
  Administered 2015-07-02: 32.84 via INTRAVENOUS

## 2015-07-02 MED ORDER — REGADENOSON 0.4 MG/5ML IV SOLN
0.4000 mg | Freq: Once | INTRAVENOUS | Status: AC
  Administered 2015-07-02: 0.4 mg via INTRAVENOUS
  Filled 2015-07-02: qty 5

## 2015-07-02 NOTE — Progress Notes (Signed)
     Patient showed up for his [place] on the morning of 7/12 after having exercised prior to the test without any anginal symptoms. No chest pain this morning during his exercise prior to his stress test or after after his exercise.    Christell Faith, PA-C 07/02/2015 4:33 PM

## 2015-07-02 NOTE — Progress Notes (Signed)
Cardiology Office Note:  Date of Encounter: 07/02/2015  ID: Robert Burch, DOB 09/25/1933, MRN [id number]  PCP:  [name], MD Primary Cardiologist:  Dr. Rockey Situ, MD  Chief Complaint  Patient presents with  . other    Pt. c/o chest pain that comes and goes; symptoms started around 06/05/2015. Meds reviewed by the patient verbally.     HPI:  81 year old male with history of PAF on Coumadin, atypical chest pain with cardiac cath in 2007 and 2010 showing nonobstructive CAD and normal stress testing x 2, NICM with EF 45% in 2010 now with improved EF to 55-65% by echo in 2013, GERD, IBS, anxiety, diverticulosis, and HTN who presents to clinic today for evaluation of his recurrent chest pain.   He does have a history of atypical chest pain and is status post 2 prior catheterizations, the last of which in 2010. He also had a stress test in May of 2015, which was nonischemic.He is active at baseline, typically goes swimming several times per week with no symptoms of SOB or chest pain. His chest pain occurs while at rest, not with exertion. He called the office on 6/9 with non-exertional chest pain that was revlieved with antacids. He was able to continue with his daily activities at that time. He was advised to follow up with his PCP. He called again on 7/7 with 2-3 week history of intermittent chest discomfort that felt like indigestion. He was in the process of getting an appointment with GI and had been taking Tylenol and Mylanta. He had been seeing Dr. Lovie Macadamia and reported his EKGs were "fine." He would like for [place] to evaluate him based on his symptoms. He has continued his twice daily walking routine without any anginal symptoms. He no longer swims as his right shoulder began bothering him with ROM activities quite some time back. He replaced that activity with increased walking. This increased walking has not led to any chest pain, nausea, vomiting, diaphoresis, presyncope, or  syncope. He does note on occasion he will be mildly SOB after walking up a steep hill, though this resolves with short rest and he continues on without issue. His weight has been stable. No lower extremity edema or abdominal fullness.   He has seen GI previously for his discomfort which has been negative to date. Work up has included EGD and abdominal US. He has an appointment with GI on 7/13.   He typically take Xanax for his underlying anxiety.      Past Medical History  Diagnosis Date  . GERD (gastroesophageal reflux disease)   . IBS (irritable bowel syndrome)   . Diverticulosis   . Hypertension   . Nonischemic cardiomyopathy     a. EF 45% by echo 2010;  b. 05/2012 Echo: EF 55-65%, no rwma, Gr 1 DD, mild AI/MR, mildly dil LA.  Marland Kitchen Atypical chest pain     a. Nonobstructive, very minor irregs by cath 2007;  b. 06/2009 Ex MV: small area of apical lateral ischemia;  c. 06/2009 Cath: essentially normal;  d. 04/2014 Lexiscan MV: EF 59%, no ischemia/infarct->low risk.  . Paroxysmal atrial fibrillation     a. Treated with flecainide  . Chronic anticoagulation     a. Chronic Coumadin  . Paralyzed vocal cords   . Chronic anxiety   . Proteinuria     History of  . Skin cancer   :  Past Surgical History  Procedure Laterality Date  . Cardiac catheterization    .  Mass biopsy  2013  . Cataract surgery Bilateral   :  Social History:  The patient  reports that he has never smoked. He has never used smokeless tobacco. He reports that he drinks about 1.1 oz of alcohol per week. He reports that he does not use illicit drugs.   Family History  Problem Relation Age of Onset  . Family history unknown: Yes     Allergies:  Allergies  Allergen Reactions  . Lanoxin [Digoxin]   . Lopressor [Metoprolol Tartrate]   . Sulfonamide Derivatives     Rash   . Verapamil      Home Medications:  Current Outpatient Prescriptions  Medication Sig Dispense Refill  . ALPRAZolam (XANAX) 0.25 MG tablet  Take 0.25 mg by mouth. Three times a week    . Cholecalciferol (VITAMIN D3) 400 UNITS CAPS Take 1 capsule by mouth daily.      Marland Kitchen co-enzyme Q-10 30 MG capsule Take 30 mg by mouth daily.      . Coenzyme Q10 50 MG CAPS Take 1 capsule by mouth daily.      . Cyanocobalamin (VITAMIN B-12 PO) Take 200 mg by mouth daily.    . Flaxseed, Linseed, (FLAX SEED OIL) 1000 MG CAPS Take 1 capsule by mouth daily.      . flecainide (TAMBOCOR) 50 MG tablet TAKE 1 & 1/2 TABLETS (75 MG TOTAL) TWICE DAILY 270 tablet 3  . GABAPENTIN, PHN, PO Take 200 mg by mouth daily.     . Magnesium 250 MG TABS Take 500 mg by mouth daily.     . Multiple Vitamin (MULTIVITAMIN) tablet Take 1 tablet by mouth daily.      . nitroGLYCERIN (NITROSTAT) 0.4 MG SL tablet Place 1 tablet (0.4 mg total) under the tongue every 5 (five) minutes as needed for chest pain. 25 tablet 6  . Omega-3 Fatty Acids (FISH OIL) 1000 MG CAPS Take 1 capsule by mouth daily.      . pantoprazole (PROTONIX) 40 MG tablet Take 40 mg by mouth 2 (two) times daily.     . ranitidine (ZANTAC) 150 MG tablet Take 150 mg by mouth daily.    . Tamsulosin HCl (FLOMAX) 0.4 MG CAPS Take 0.4 mg by mouth 2 (two) times daily.     . valsartan (DIOVAN) 40 MG tablet Take 20 mg by mouth 2 (two) times daily.     Marland Kitchen warfarin (COUMADIN) 2.5 MG tablet Take 2.5 mg by mouth daily.      No current facility-administered medications for this visit.   Facility-Administered Medications Ordered in Other Visits  Medication Dose Route Frequency Provider Last Rate Last Dose   None                        Review of Systems:  Review of Systems  Constitutional: Negative for fever, chills, weight loss, malaise/fatigue and diaphoresis.  HENT: Negative for congestion.   Eyes: Negative for blurred vision, discharge and redness.  Respiratory: Positive for shortness of breath. Negative for cough, hemoptysis, sputum production and wheezing.        Mild SOB when he finishes walking up a hill    Cardiovascular: Positive for chest pain. Negative for palpitations, orthopnea, claudication, leg swelling and PND.  Gastrointestinal: Negative for heartburn, nausea, vomiting, blood in stool and melena.  Genitourinary: Negative for hematuria.  Musculoskeletal: Negative for myalgias and falls.  Skin: Negative for rash.  Neurological: Negative for dizziness, sensory change, speech change, focal weakness,  loss of consciousness, weakness and headaches.  Endo/Heme/Allergies: Does not bruise/bleed easily.  Psychiatric/Behavioral: The patient is nervous/anxious.   All other systems reviewed and are negative.    Physical Exam:  Blood pressure 134/86, pulse 59, height 5\' 9"  (1.753 m), weight 188 lb (85.276 kg). BMI: Body mass index is 27.75 kg/(m^2). General: Pleasant, NAD. Psych: Normal affect. Responds to questions with normal affect.  Neuro: Alert and oriented X 3. Moves all extremities spontaneously. HEENT: Normocephalic, atraumatic. EOM intact. Sclera anicteric.  Neck: Trachea midline. Supple without bruits or JVD. Lungs:  Respirations regular and unlabored. CTA bilaterally without wheezing, crackles, or rhonchi.  Heart: RRR, normal s3, s4. II/VI systolic murmur at apex. No rubs or gallops.  Abdomen: Soft, non-tender, non-distended, BS + x 4.  Extremities: No clubbing, cyanosis or edema. DP/PT/Radials 2+ and equal bilaterally.   Accessory Clinical Findings:  EKG: sinus bradycardia, 59 bpm, no significant st.t changes    Recent Labs: No results found for requested labs within last 365 days.  No results found for requested labs within last 365 days.  CrCl cannot be calculated (Patient has no serum creatinine result on file.).  Weights: Wt Readings from Last 3 Encounters:  07/01/15 188 lb (85.276 kg)  04/22/15 192 lb 8 oz (87.317 kg)  10/18/14 189 lb (85.73 kg)    Other studies Reviewed: Additional studies/ records that were reviewed today include: prior notes and  studies.  Assessment & Plan:  1. Atypical chest pain:  -Chest pain occuring at rest  -Normal cardiac caths x 2 in 2007 and 2010, non-ischemic nucs in 2014 and 2015 -Pain has not limited his ability to continue with twice daily exercises  -Some component of symptoms with eating, he has a GI appointment scheduled for 7/13 -Symptoms sound atypical in etiology -EKG without acute changes -Patient prefers Lexiscan Myoview at this time for further ischemic evaluation, no cardiac cath -Continue with GI appointment   2. HTN:  -Controlled -Continue valsartan 20 mg bid  3. PAF:  -Remains in NSR  -Continue flecainide and chronic Coumadin  -CHADSVASc at least 3 (HTN, age x 2) giving him an estimated annual stroke risk of 3.2%   4. NICM:  -Now with improved EF by echo in 2013  -Continue valsartan  -Multiple normal ischemic evaluations  -Check Myoview as above  5. Mild MR and AI by echo 2013: -Update study given symptoms    Dispo: -Follow up with Dr. Rockey Situ in 6 months   Current medicines are reviewed at length with the patient today.  The patient did not have any concerns regarding medicines.   Christell Faith, PA-C [address] [address], [place] [id number] [phone] [facility] 07/02/2015, 11:02 AM

## 2015-07-03 ENCOUNTER — Telehealth: Payer: Self-pay | Admitting: Cardiovascular Disease

## 2015-07-03 ENCOUNTER — Other Ambulatory Visit: Payer: Self-pay | Admitting: Gastroenterology

## 2015-07-03 DIAGNOSIS — R1013 Epigastric pain: Secondary | ICD-10-CM

## 2015-07-03 LAB — NM MYOCAR MULTI W/SPECT W/WALL MOTION / EF
CHL CUP NUCLEAR SRS: 3
CHL CUP RESTING HR STRESS: 58 {beats}/min
CSEPPHR: 72 {beats}/min
LV sys vol: 50 mL
LVDIAVOL: 139 mL
Percent HR: 51 %
SDS: 1
SSS: 1
TID: 1.05

## 2015-07-03 NOTE — Telephone Encounter (Signed)
Needs stress results from yesterday

## 2015-07-04 ENCOUNTER — Ambulatory Visit
Admission: RE | Admit: 2015-07-04 | Discharge: 2015-07-04 | Disposition: A | Discharge status: Home / Self Care | Payer: Medicare Other | Source: Ambulatory Visit | Attending: Gastroenterology | Admitting: Gastroenterology

## 2015-07-04 ENCOUNTER — Other Ambulatory Visit: Payer: Self-pay

## 2015-07-04 DIAGNOSIS — R1013 Epigastric pain: Secondary | ICD-10-CM | POA: Insufficient documentation

## 2015-07-04 DIAGNOSIS — N281 Cyst of kidney, acquired: Secondary | ICD-10-CM | POA: Diagnosis not present

## 2015-07-04 DIAGNOSIS — R079 Chest pain, unspecified: Secondary | ICD-10-CM

## 2015-07-05 ENCOUNTER — Other Ambulatory Visit: Payer: Self-pay

## 2015-07-05 ENCOUNTER — Ambulatory Visit (INDEPENDENT_AMBULATORY_CARE_PROVIDER_SITE_OTHER): Payer: Medicare Other

## 2015-07-05 DIAGNOSIS — R079 Chest pain, unspecified: Secondary | ICD-10-CM | POA: Diagnosis not present

## 2015-10-29 DIAGNOSIS — R310 Gross hematuria: Secondary | ICD-10-CM | POA: Insufficient documentation

## 2015-11-26 DIAGNOSIS — N323 Diverticulum of bladder: Secondary | ICD-10-CM | POA: Insufficient documentation

## 2015-12-31 ENCOUNTER — Encounter: Payer: Self-pay | Admitting: Cardiovascular Disease

## 2015-12-31 ENCOUNTER — Ambulatory Visit (INDEPENDENT_AMBULATORY_CARE_PROVIDER_SITE_OTHER): Payer: Medicare Other | Admitting: Cardiovascular Disease

## 2015-12-31 VITALS — BP 118/80 | HR 64 | Ht 69.0 in | Wt 194.0 lb

## 2015-12-31 DIAGNOSIS — I10 Essential (primary) hypertension: Secondary | ICD-10-CM

## 2015-12-31 DIAGNOSIS — E785 Hyperlipidemia, unspecified: Secondary | ICD-10-CM

## 2015-12-31 DIAGNOSIS — R002 Palpitations: Secondary | ICD-10-CM | POA: Diagnosis not present

## 2015-12-31 DIAGNOSIS — I480 Paroxysmal atrial fibrillation: Secondary | ICD-10-CM | POA: Diagnosis not present

## 2015-12-31 NOTE — Assessment & Plan Note (Signed)
He does report occasional lightheaded spells If he continues to have orthostasis, recommended fluid hydration, cutting back on his Diovan down to 20 minute grams daily

## 2015-12-31 NOTE — Assessment & Plan Note (Signed)
Maintaining normal sinus, rare palpitations, no significant atrial fibrillation

## 2015-12-31 NOTE — Assessment & Plan Note (Signed)
Cholesterol as detailed above. He does not want a statin

## 2015-12-31 NOTE — Progress Notes (Signed)
Patient ID: Robert Burch, male    DOB: 1933/08/01, 82 y.o.   MRN: [id number]  HPI Comments: 82 yo with history of paroxysmal atrial fibrillation, h/o chest pain, catheterization in 2007 showing nonobstructive CAD, Presenting for routine followup of his atrial fibrillation and chest pain symptoms.  In follow-up, overall reports he is doing well Denies any significant chest pain on exertion July 2016 had stress test showing no ischemia, echocardiogram confirming normal ejection fraction greater than 60% Studies reviewed with him in detail today  Occasionally has palpitations Has been battling with urinary tract infection, currently on ABX Lab work reviewed with him in detail from June 2016 showing total cholesterol 160, LDL 106, HDL 38 Active at baseline, close walking once or twice per day No symptoms concerning for angina no significant atrial fibrillation symptoms He has diltiazem for breakthrough atrial fibrillation   EKG on today's visit shows normal sinus rhythm with rate 64 bpm, borderline interventricular conduction delay, no change from prior EKG  Other past medical history son went through  valve surgery in 2015 , this made him nervous about his own health Last stress test May 2015 showing  no ischemia  Previously had significant GI workup for discomfort, most of which has been negative. This included an EGD, abdominal ultrasound. He does take Tylenol and gabapentin.  chronic issue with sweats that seem to come on, unprovoked. Etiology is uncertain. It is not clear how much Xanax he is taking though he does report having a long history of anxiety.  Old lab work, cholesterol 162 LDL 100, HDL 40       Allergies  Allergen Reactions  . Lanoxin [Digoxin]   . Lopressor [Metoprolol Tartrate]   . Sulfonamide Derivatives     Rash   . Verapamil     Current Outpatient Prescriptions on File Prior to Visit  Medication Sig Dispense Refill  . ALPRAZolam (XANAX)  0.25 MG tablet Take 0.25 mg by mouth. Three times a week    . Cholecalciferol (VITAMIN D3) 400 UNITS CAPS Take 1 capsule by mouth daily.      Marland Kitchen co-enzyme Q-10 30 MG capsule Take 30 mg by mouth daily.      . Coenzyme Q10 50 MG CAPS Take 1 capsule by mouth daily.      . Cyanocobalamin (VITAMIN B-12 PO) Take 200 mg by mouth daily.    . Flaxseed, Linseed, (FLAX SEED OIL) 1000 MG CAPS Take 1 capsule by mouth daily.      . flecainide (TAMBOCOR) 50 MG tablet TAKE 1 & 1/2 TABLETS (75 MG TOTAL) TWICE DAILY 270 tablet 3  . GABAPENTIN, PHN, PO Take 200 mg by mouth daily.     . Multiple Vitamin (MULTIVITAMIN) tablet Take 1 tablet by mouth daily.      . nitroGLYCERIN (NITROSTAT) 0.4 MG SL tablet Place 1 tablet (0.4 mg total) under the tongue every 5 (five) minutes as needed for chest pain. 25 tablet 6  . Omega-3 Fatty Acids (FISH OIL) 1000 MG CAPS Take 1 capsule by mouth daily.      . ranitidine (ZANTAC) 150 MG tablet Take 150 mg by mouth daily.    . Tamsulosin HCl (FLOMAX) 0.4 MG CAPS Take 0.4 mg by mouth 2 (two) times daily.     . valsartan (DIOVAN) 40 MG tablet Take 20 mg by mouth 2 (two) times daily.     Marland Kitchen warfarin (COUMADIN) 2.5 MG tablet Take 2.5 mg by mouth daily.      No  current facility-administered medications on file prior to visit.    Past Medical History  Diagnosis Date  . GERD (gastroesophageal reflux disease)   . IBS (irritable bowel syndrome)   . Diverticulosis   . Hypertension   . Nonischemic cardiomyopathy ([place])     a. EF 45% by echo 2010;  b. 05/2012 Echo: EF 55-65%, no rwma, Gr 1 DD, mild AI/MR, mildly dil LA.  Marland Kitchen Atypical chest pain     a. Nonobstructive, very minor irregs by cath 2007;  b. 06/2009 Ex MV: small area of apical lateral ischemia;  c. 06/2009 Cath: essentially normal;  d. 04/2014 Lexiscan MV: EF 59%, no ischemia/infarct->low risk.  . Paroxysmal atrial fibrillation ([place])     a. Treated with flecainide  . Chronic anticoagulation     a. Chronic Coumadin  . Paralyzed  vocal cords   . Chronic anxiety   . Proteinuria     History of  . Skin cancer     Past Surgical History  Procedure Laterality Date  . Cardiac catheterization    . Mass biopsy  2013  . Cataract surgery Bilateral     Social History  reports that he has never smoked. He has never used smokeless tobacco. He reports that he drinks about 1.1 oz of alcohol per week. He reports that he does not use illicit drugs.  Family History Family history is unknown by patient.   Review of Systems  Constitutional: Negative.   Respiratory: Negative.   Cardiovascular: Negative.   Gastrointestinal: Negative.   Musculoskeletal: Negative.   Skin: Negative.   Neurological: Negative.        Sweats  Hematological: Negative.   Psychiatric/Behavioral: Negative.   All other systems reviewed and are negative.   BP 118/80 mmHg  Pulse 64  Ht 5\' 9"  (1.753 m)  Wt 194 lb (87.998 kg)  BMI 28.64 kg/m2  Physical Exam  Constitutional: He is oriented to person, place, and time. He appears well-developed and well-nourished.  HENT:  Head: Normocephalic.  Nose: Nose normal.  Mouth/Throat: Oropharynx is clear and moist.  Eyes: Conjunctivae are normal. Pupils are equal, round, and reactive to light.  Neck: Normal range of motion. Neck supple. No JVD present.  Cardiovascular: Normal rate, regular rhythm, S1 normal, S2 normal, normal heart sounds and intact distal pulses.  Exam reveals no gallop and no friction rub.   No murmur heard. Pulmonary/Chest: Effort normal and breath sounds normal. No respiratory distress. He has no wheezes. He has no rales. He exhibits no tenderness.  Abdominal: Soft. Bowel sounds are normal. He exhibits no distension. There is no tenderness.  Musculoskeletal: Normal range of motion. He exhibits no edema or tenderness.  Lymphadenopathy:    He has no cervical adenopathy.  Neurological: He is alert and oriented to person, place, and time. Coordination normal.  Skin: Skin is warm and  dry. No rash noted. No erythema.  Psychiatric: He has a normal mood and affect. His behavior is normal. Judgment and thought content normal.      Assessment and Plan   Nursing note and vitals reviewed.

## 2015-12-31 NOTE — Patient Instructions (Addendum)
You are doing well. No medication changes were made.  If you have lightheaded episodes, Decrease the dose of the diovan down to one a day, Drink fluids  Please call us if you have new issues that need to be addressed before your next appt.  Your physician wants you to follow-up in: 6 months.  You will receive a reminder letter in the mail two months in advance. If you don't receive a letter, please call our office to schedule the follow-up appointment.

## 2015-12-31 NOTE — Assessment & Plan Note (Signed)
Blood pressure is well controlled on today's visit. No changes made to the medications. 

## 2016-01-27 ENCOUNTER — Telehealth: Payer: Self-pay | Admitting: Cardiovascular Disease

## 2016-01-27 DIAGNOSIS — R002 Palpitations: Secondary | ICD-10-CM

## 2016-01-27 NOTE — Telephone Encounter (Signed)
Patient c/o Palpitations:  High priority if patient c/o lightheadedness and shortness of breath.  1. How long have you been having palpitations?   On and off for a while told gollan last month now they are lasting longer (about an hour last night)   2. Are you currently experiencing lightheadedness and shortness of breath?  No   3. Have you checked your BP and heart rate? (document readings)   Yesterday - 136/67 pulse 57    4. Are you experiencing any other symptoms? No not really   Feels like a missing beat and then next beat is harder -

## 2016-01-27 NOTE — Telephone Encounter (Signed)
Would recommend 48 hour Holter to identify his rhythm  If this is not long enough to identify his palpitations, may need 30 day monitor Likely having ectopy, cannot exclude atrial fibrillation Would not adjust flecainide at this time

## 2016-01-27 NOTE — Telephone Encounter (Signed)
Spoke w/ pt. He reports that he was here last month to see Dr. Rockey Situ, his palpitations were very rare. At that time, his Diovan was decreased from 40 mg to 20 mg.  Reports that palpitations/PVCs have increased in frequency and duration recently. Pt reports that his HR will drop and he can feel that his heart is skipping beats.  States that he does not become lightheaded or feel bad "it's different than when I'm in afib"; he has not taken diltiazem due to low HR. Pt denies additional stressors, "no more than usual", has recently decreased his chocolate intake due to caffeine. He would like to know if his flecainide dosage should be adjusted.   Advised him that I will make Dr. Rockey Situ aware and call him back w/ his recommendation.

## 2016-01-28 NOTE — Telephone Encounter (Signed)
Spoke w/ pt.  Advised him of Dr. Donivan Scull recommendation.  He is agreeable to this and call transferred to front desk for scheduling.

## 2016-02-03 ENCOUNTER — Ambulatory Visit (INDEPENDENT_AMBULATORY_CARE_PROVIDER_SITE_OTHER): Payer: Medicare Other

## 2016-02-03 DIAGNOSIS — R002 Palpitations: Secondary | ICD-10-CM | POA: Diagnosis not present

## 2016-02-10 ENCOUNTER — Ambulatory Visit
Admission: RE | Admit: 2016-02-10 | Discharge: 2016-02-10 | Disposition: A | Discharge status: Home / Self Care | Payer: Medicare Other | Source: Ambulatory Visit | Attending: Cardiovascular Disease | Admitting: Cardiovascular Disease

## 2016-02-10 ENCOUNTER — Ambulatory Visit: Admission: RE | Admit: 2016-02-10 | Payer: Medicare Other | Source: Ambulatory Visit

## 2016-03-20 ENCOUNTER — Telehealth: Payer: Self-pay | Admitting: Cardiovascular Disease

## 2016-03-20 DIAGNOSIS — R002 Palpitations: Secondary | ICD-10-CM

## 2016-03-20 NOTE — Telephone Encounter (Signed)
Would recommend that he start metoprolol tartrate, 25 mg One half dose in the morning, full pill at dinner time

## 2016-03-20 NOTE — Telephone Encounter (Signed)
Pt recently wore holter monitor:  "Normal sinus rhythm with rare APCs and PVCs, couplets". Pt denies any symptoms, reports that he can feel palpitations at night or when he is resting. Seem to be increasing in frequency and he would like Dr. Donivan Scull recommendation. Please advise.  Thank you.

## 2016-03-20 NOTE — Telephone Encounter (Signed)
Patient c/o Palpitations:  High priority if patient c/o lightheadedness and shortness of breath.  1. How long have you been having palpitations? 3-4 weeks   2. Are you currently experiencing lightheadedness and shortness of breath? Nothing   3. Have you checked your BP and heart rate? (document readings)  Normal (states)  134/69 Hr 58 (today)  03/09/16 118/64  4. Are you experiencing any other symptoms? during day pt seems to be doing fine, then night time it starts to get bad.  Seem to only come when pt is at rest.  Please advise

## 2016-03-23 NOTE — Telephone Encounter (Signed)
Spoke w/ pt.  Advised him of Dr. Donivan Scull recommendation.  Pt reports "issues" w/ BB previously. On entering metoprolol rx, received notification that pt has allergy. He reports chest heaviness when he previously took it. Pt is interested in wearing 30 day monitor to see the extent of his arrhythmias, as he does not feel that he wore the holter long enough. Advised him that he will receive a call from [employer] to verify his mailing address before they ship the monitor to his home. He would like to know if Dr. Rockey Situ recommends another med right now or if he would like to wait a week or two.

## 2016-03-23 NOTE — Telephone Encounter (Signed)
Would wear monitor for now, If having APCs and PVCs as previously documented, these are not harmful, just bothersome We can wait on medication Beta blockers such as metoprolol are usually the best medication to start with

## 2016-03-26 ENCOUNTER — Encounter (INDEPENDENT_AMBULATORY_CARE_PROVIDER_SITE_OTHER): Payer: Medicare Other

## 2016-03-26 ENCOUNTER — Telehealth: Payer: Self-pay | Admitting: Cardiovascular Disease

## 2016-03-26 DIAGNOSIS — R002 Palpitations: Secondary | ICD-10-CM | POA: Diagnosis not present

## 2016-03-26 NOTE — Telephone Encounter (Signed)
Spoke with Robert Burch told him he can bring his monitor to the office and someone will assist him with putting the monitor on.

## 2016-03-26 NOTE — Telephone Encounter (Signed)
Patient needs assistance with putting on holter monitor.  Wants to come to clinic to have nurse put on this afternoon.  Please call patient or let me know if i should put on nurse visit.

## 2016-04-30 ENCOUNTER — Ambulatory Visit (INDEPENDENT_AMBULATORY_CARE_PROVIDER_SITE_OTHER): Payer: Medicare Other | Admitting: Internal Medicine

## 2016-04-30 ENCOUNTER — Encounter: Payer: Self-pay | Admitting: Internal Medicine

## 2016-04-30 VITALS — BP 120/80 | HR 62 | Ht 70.5 in | Wt 187.0 lb

## 2016-04-30 DIAGNOSIS — I10 Essential (primary) hypertension: Secondary | ICD-10-CM

## 2016-04-30 DIAGNOSIS — I480 Paroxysmal atrial fibrillation: Secondary | ICD-10-CM | POA: Diagnosis not present

## 2016-04-30 MED ORDER — MAGNESIUM 400 MG PO TABS: 400.0000 mg | ORAL_TABLET | Freq: Two times a day (BID) | ORAL | Status: DC

## 2016-04-30 NOTE — Patient Instructions (Addendum)
Medication Instructions:  Your physician has recommended you make the following change in your medication:  1) INCREASE Magnesium to 400 mg twice daily 2) STOP Valsartan  Labwork: None ordered  Testing/Procedures: None ordered  Follow-Up: Your physician wants you to follow-up in: 6 months with Dr. Rockey Situ. You will receive a reminder letter in the mail two months in advance. If you don't receive a letter, please call our office to schedule the follow-up appointment.  Your physician wants you to follow-up in: 1 year with Dr. [name]. You will receive a reminder letter in the mail two months in advance. If you don't receive a letter, please call our office to schedule the follow-up appointment.  If you need a refill on your cardiac medications before your next appointment, please call your pharmacy.  Thank you for choosing [facility]!!

## 2016-04-30 NOTE — Progress Notes (Signed)
Patient Care Team: [name], MD as PCP - General (Family Medicine) Deboraha Sprang, MD as Consulting Physician (Cardiology) Minna Merritts, MD as Consulting Physician (Cardiology)   HPI  Robert Burch is a 82 y.o. male Seen in followup for paroxysmal atrial fibrillation in the setting of nonischemic coronary disease by recent catheterization and modest hypertension.   His atrial fib is largely quiet currently with last episode in JUne 2012 He was referred to Dr Charise Carwin for consideration of pulm vein isolation; he declined ablation; and has been on magnesium without significant atrial fibrillation  I had not seen in a couple of years. He's been following up with Dr. Deidre Ala. More palpitations prompted a Holter monitor. It demonstrated PVCs and PACs and couplets.  These initially responded to increasing his magnesium; this was limited by diarrhea.  He denies chest pain or shortness of breath     Records and Results Reviewed notes from Dr. Deidre Ala Holter monitors  Past Medical History  Diagnosis Date  . GERD (gastroesophageal reflux disease)   . IBS (irritable bowel syndrome)   . Diverticulosis   . Hypertension   . Nonischemic cardiomyopathy ([place])     a. EF 45% by echo 2010;  b. 05/2012 Echo: EF 55-65%, no rwma, Gr 1 DD, mild AI/MR, mildly dil LA.  Marland Kitchen Atypical chest pain     a. Nonobstructive, very minor irregs by cath 2007;  b. 06/2009 Ex MV: small area of apical lateral ischemia;  c. 06/2009 Cath: essentially normal;  d. 04/2014 Lexiscan MV: EF 59%, no ischemia/infarct->low risk.  . Paroxysmal atrial fibrillation ([place])     a. Treated with flecainide  . Chronic anticoagulation     a. Chronic Coumadin  . Paralyzed vocal cords   . Chronic anxiety   . Proteinuria     History of  . Skin cancer     Past Surgical History  Procedure Laterality Date  . Cardiac catheterization    . Mass biopsy  2013  . Cataract surgery Bilateral     Current Outpatient Prescriptions    Medication Sig Dispense Refill  . ALPRAZolam (XANAX) 0.25 MG tablet Take 0.25 mg by mouth as needed. Three times a week    . Cholecalciferol (VITAMIN D3) 400 UNITS CAPS Take 1 capsule by mouth daily.      . Coenzyme Q10 50 MG CAPS Take 1 capsule by mouth 2 (two) times daily.     . Cyanocobalamin (VITAMIN B-12 PO) Take 200 mg by mouth daily.    . Flaxseed, Linseed, (FLAX SEED OIL) 1000 MG CAPS Take 1 capsule by mouth daily.      . flecainide (TAMBOCOR) 50 MG tablet TAKE 1 & 1/2 TABLETS (75 MG TOTAL) TWICE DAILY 270 tablet 3  . GABAPENTIN, PHN, PO Take 200 mg by mouth daily.     . Magnesium 250 MG TABS Take 250 mg by mouth 2 (two) times daily.     . Multiple Vitamin (MULTIVITAMIN) tablet Take 1 tablet by mouth daily.      . nitroGLYCERIN (NITROSTAT) 0.4 MG SL tablet Place 1 tablet (0.4 mg total) under the tongue every 5 (five) minutes as needed for chest pain. 25 tablet 6  . Omega-3 Fatty Acids (FISH OIL) 1000 MG CAPS Take 1 capsule by mouth daily.      . pantoprazole (PROTONIX) 20 MG tablet Take 20 mg by mouth daily.    . ranitidine (ZANTAC) 150 MG tablet Take 150 mg by mouth daily.    Marland Kitchen  Tamsulosin HCl (FLOMAX) 0.4 MG CAPS Take 0.4 mg by mouth 2 (two) times daily.     . valsartan (DIOVAN) 40 MG tablet Take 20 mg by mouth 2 (two) times daily.     Marland Kitchen warfarin (COUMADIN) 2.5 MG tablet Take 2.5 mg by mouth daily.      No current facility-administered medications for this visit.    Allergies  Allergen Reactions  . Lanoxin [Digoxin]   . Lopressor [Metoprolol Tartrate]   . Sulfonamide Derivatives     Rash   . Verapamil       Review of Systems negative except from HPI and PMH  Physical Exam BP 120/80 mmHg  Pulse 62  Ht 5' 10.5" (1.791 m)  Wt 187 lb (84.823 kg)  BMI 26.44 kg/m2 Well developed and well nourished in no acute distress HENT normal E scleral and icterus clear Neck Supple JVP flat; carotids brisk and full Clear to ausculation  Regular rate and rhythm, no murmurs  gallops or rub Soft with active bowel sounds No clubbing cyanosis  Edema Alert and oriented, grossly normal motor and sensory function Skin Warm and Dry  ECG demonstrates normal sinus rhythm at 62 Intervals 18/12/41  Assessment and  Plan  Atrial fibrillation-paroxysmal  PACs/PVCs  Loose stools  We will change his magnesium citrate--magnesium oxide.  I've encouraged him to consider increasing his flecainide from 75--100 mg twice daily if the palpitations remained discombobulating. I have assured him that they are benign.

## 2016-06-21 ENCOUNTER — Other Ambulatory Visit: Payer: Self-pay | Admitting: Cardiovascular Disease

## 2016-07-14 ENCOUNTER — Ambulatory Visit: Payer: Medicare Other | Admitting: Cardiovascular Disease

## 2016-09-16 ENCOUNTER — Telehealth: Payer: Self-pay | Admitting: Cardiovascular Disease

## 2016-09-16 NOTE — Telephone Encounter (Addendum)
Pt reports "off and on aching pain" under his left breast, sometimes radiating to the right breast since Sunday, accompanied by elevated BP. Pt has hx of atypical CP. He denies any other sx. Pt just walked up a flight of stairs and feels normal, no SOB. Valsartan was d/c'd at May OV d/t lightheadedness. He restarted Valsartan 20mg  BID Sunday. This morning readings 164/84 and 151/77. Recheck one hour post BP meds, 128/69.  States he feels better today.  He swims three days a week but has not since Saturday d/t concern of sx. Informed pt sx could be related to muscles used during swimming.  He agrees but would like Dr. Donivan Scull opinion. Reviewed s/s that would need immediate attention in an ER setting.  Advised pt to continue to monitor s/s and will forward to MD to review and advise.  Pt verbalized understanding and is agreeable w/plan. States he is going out to lunch and will return home around 4:30pm

## 2016-09-16 NOTE — Telephone Encounter (Signed)
I suspect his chest pain is musculoskeletal Would continue to monitor symptoms, could do further testing if he has recurrent symptoms with exertion If able to swim without any significant symptoms, this is reassuring Previous stress test looked very good No significant disease by prior catheterization

## 2016-09-16 NOTE — Telephone Encounter (Signed)
Pt c/o of Chest Pain: STAT if CP now or developed within 24 hours  1. Are you having CP right now? Very slight, like a achy pain  2. Are you experiencing any other symptoms (ex. SOB, nausea, vomiting, sweating)? BP is elevated 164/84 at 7:30 am. Since then he has taken his meds 151/77  3. How long have you been experiencing CP? Since Monday  4. Is your CP continuous or coming and going? Comes and goes   5. Have you taken Nitroglycerin? no ?

## 2016-09-17 MED ORDER — VALSARTAN 40 MG PO TABS: 20.0000 mg | ORAL_TABLET | Freq: Every day | ORAL | 3 refills | Status: DC

## 2016-09-17 NOTE — Telephone Encounter (Signed)
Spoke w/ pt.  Advised him of Dr. Donivan Scull recommendation. He reports that he walked a mile this am w/ no problems and is feeling good. He states that he has reduced his diovan to 20 mg once daily. Advised him that this is not on his med list. He states that Dr. [name] advised him to hold this due to lightheadedness, but he has resumed at a lower dose (he is cutting the 40 mg tabs in 1/2). He reports that his BP has been good and he is feeling better on this dose. He will continue to monitor his sx and call back w/ any further questions or concerns.

## 2016-10-17 IMAGING — US US ABDOMEN COMPLETE
1 series · 14 of 25 positions shown · non-contrast
Comparison: CT abdomen and pelvis of 07/04/2014

CLINICAL DATA: Acute epigastric pain for several weeks, some chest
pain

EXAM:
ULTRASOUND ABDOMEN COMPLETE

[Series 1: us abdomen complete · 0.20mm/px · 14 of 95 slices shown]
[im 1/95]
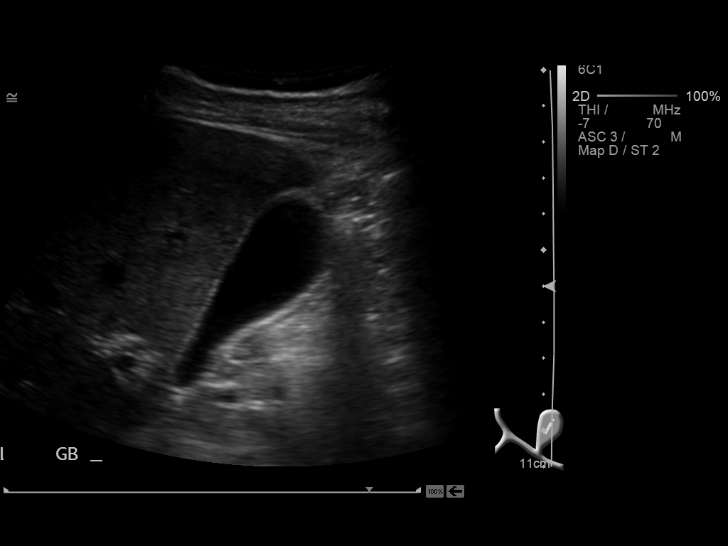
[im 8/95]
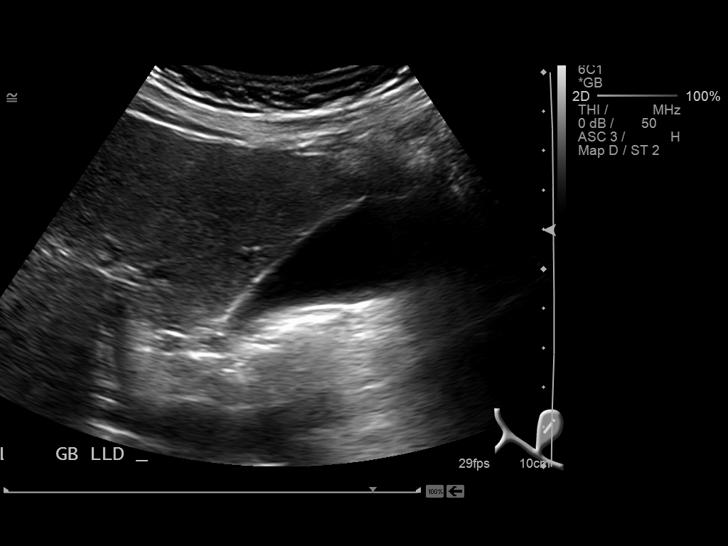
[im 16/95]
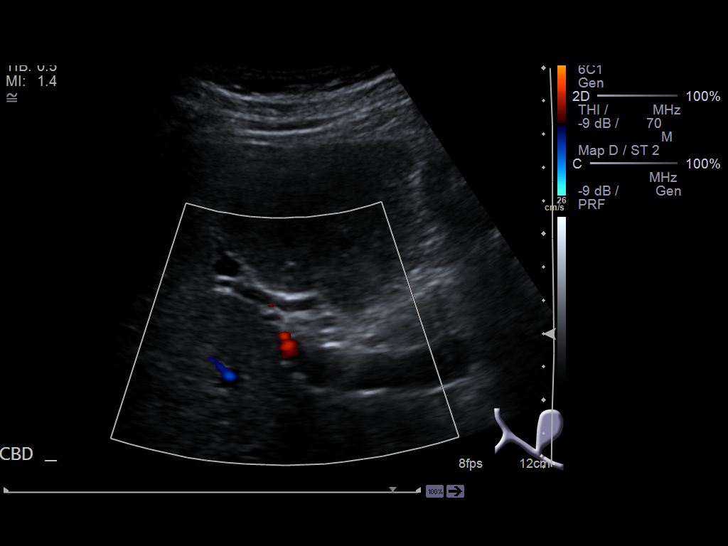
[im 24/95]
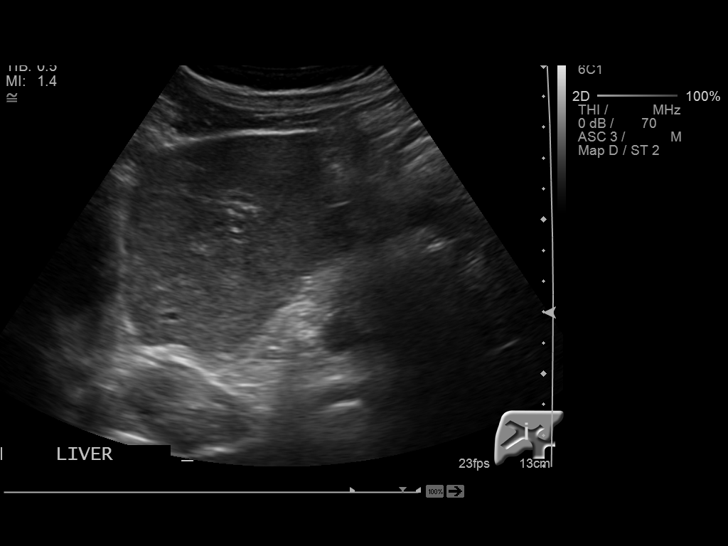
[im 32/95]
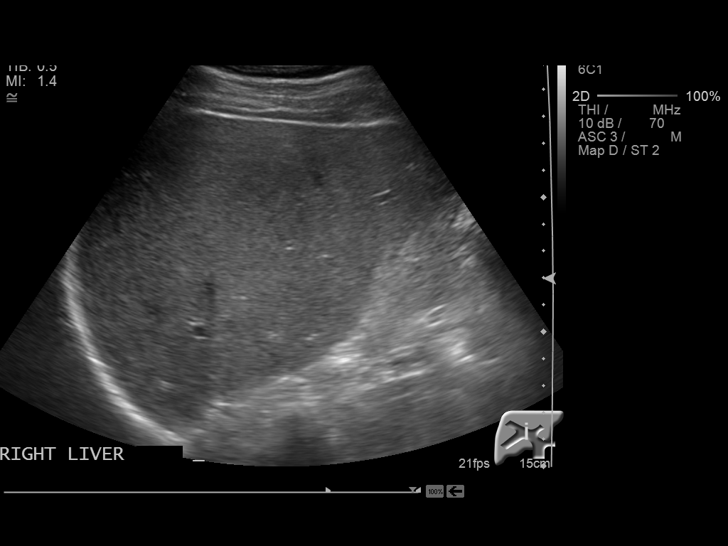
[im 36/95]
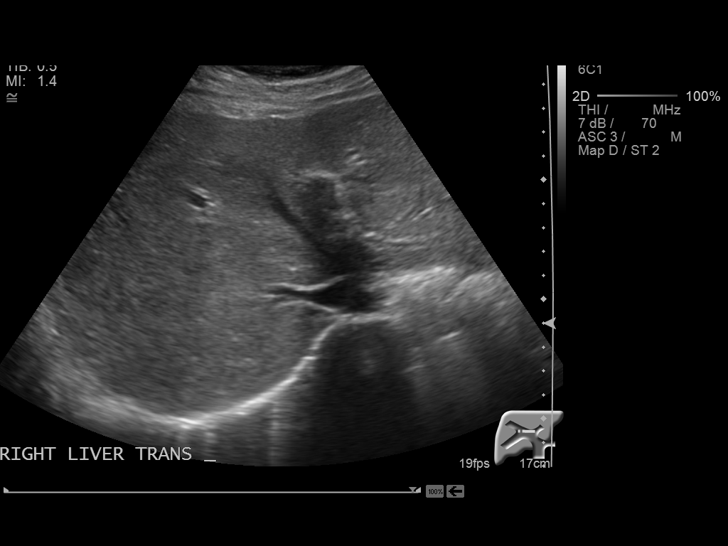
[im 44/95]
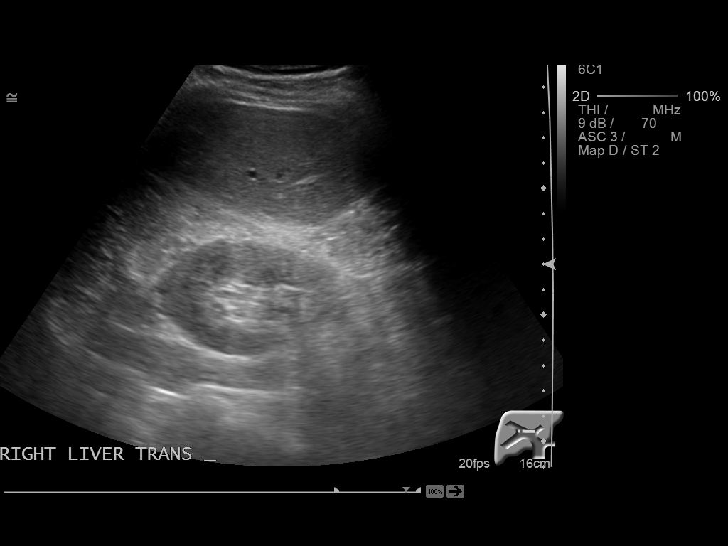
[im 51/95]
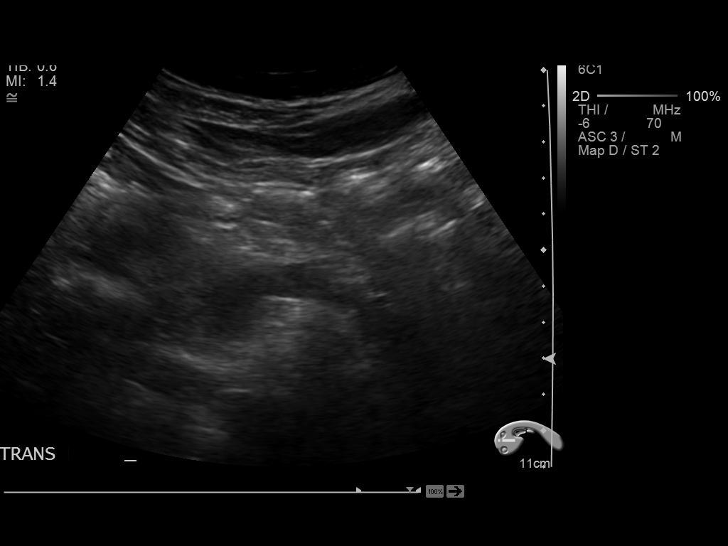
[im 59/95]
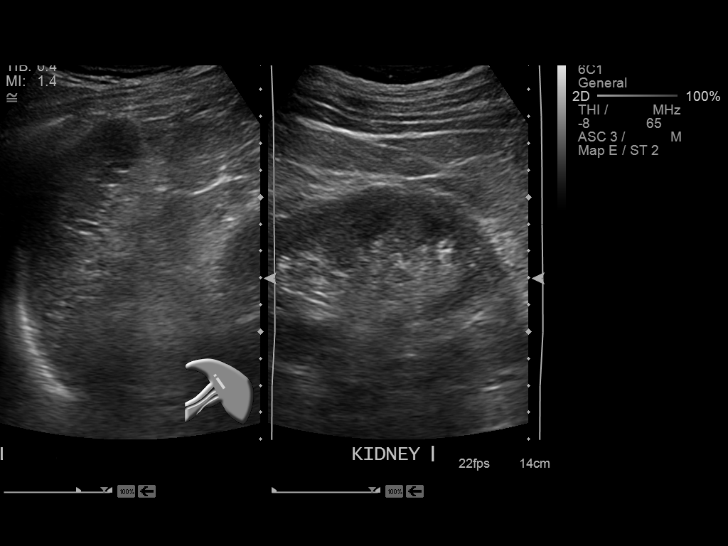
[im 63/95]
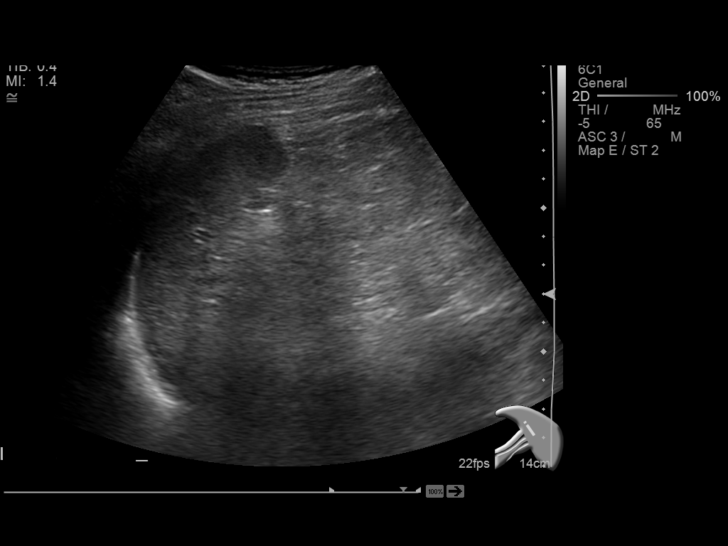
[im 71/95]
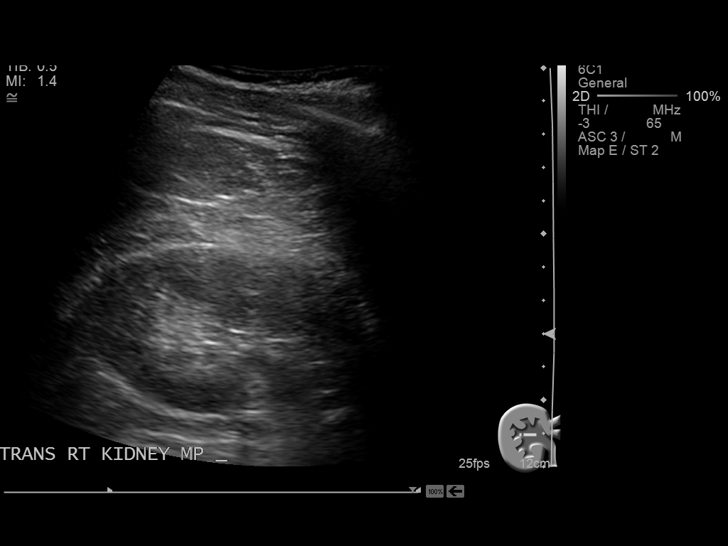
[im 79/95]
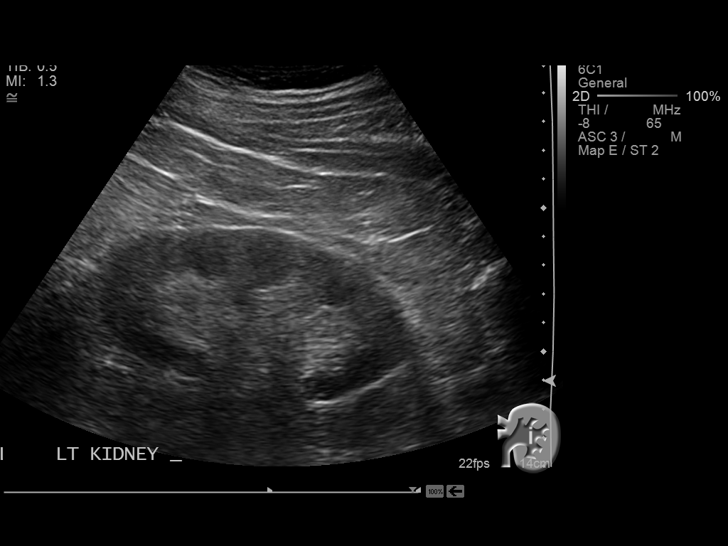
[im 87/95]
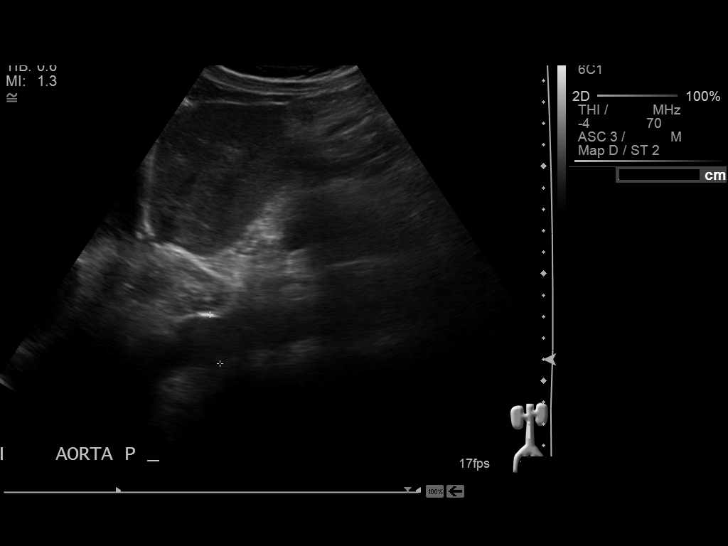
[im 95/95]
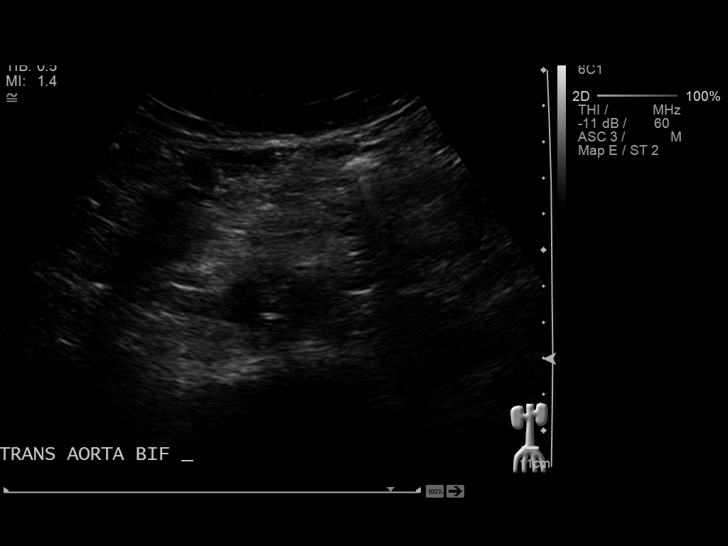

[14 of 25 positions shown; findings below may reference images not displayed]

FINDINGS: Gallbladder: The gallbladder is visualized and no gallstones are
noted. There is no pain over the gallbladder with compression.

Common bile duct: Diameter: The common bile duct is normal measuring
4.3 mm in diameter.

Liver: The liver has a normal echogenic pattern. No focal hepatic
abnormality is seen.

IVC: No abnormality visualized.

Pancreas: The pancreas is largely obscured by bowel gas.

Spleen: The spleen is normal measuring 4.3 cm.

Right Kidney: Length: 11.2 cm.. No hydronephrosis is seen. A 1.4 cm
cyst is noted in the lower pole.

Left Kidney: Length: 11.3 cm..  No hydronephrosis is noted.

Abdominal aorta: The abdominal aorta is normal in caliber.

Other findings: None.
IMPRESSION: 1. Negative abdominal ultrasound.  No gallstones.
2. The pancreas is largely obscured by bowel gas.
3. 1.4 cm lower pole right renal cyst.

## 2016-10-22 ENCOUNTER — Ambulatory Visit (INDEPENDENT_AMBULATORY_CARE_PROVIDER_SITE_OTHER): Payer: Medicare Other | Admitting: Cardiovascular Disease

## 2016-10-22 ENCOUNTER — Encounter: Payer: Self-pay | Admitting: Cardiovascular Disease

## 2016-10-22 VITALS — BP 130/70 | HR 65 | Ht 69.0 in | Wt 192.8 lb

## 2016-10-22 DIAGNOSIS — I480 Paroxysmal atrial fibrillation: Secondary | ICD-10-CM

## 2016-10-22 DIAGNOSIS — E7800 Pure hypercholesterolemia, unspecified: Secondary | ICD-10-CM | POA: Diagnosis not present

## 2016-10-22 DIAGNOSIS — I10 Essential (primary) hypertension: Secondary | ICD-10-CM

## 2016-10-22 MED ORDER — MAGNESIUM 400 MG PO TABS
400.0000 mg | ORAL_TABLET | Freq: Two times a day (BID) | ORAL | 11 refills | Status: DC
Start: 2016-10-22 — End: 2016-10-22

## 2016-10-22 MED ORDER — MAGNESIUM 400 MG PO TABS: 400.0000 mg | ORAL_TABLET | Freq: Two times a day (BID) | ORAL | 6 refills | Status: DC

## 2016-10-22 NOTE — Progress Notes (Signed)
Cardiology Office Note  Date:  10/22/2016   ID:  Robert Burch, DOB 1933-06-17, MRN [id number]  PCP:  [name], MD   Chief Complaint  Patient presents with  . Follow-up    6 month, palpitations, PVCs    HPI:  82 yo with history of paroxysmal atrial fibrillation, h/o chest pain, catheterization in 2007 showing nonobstructive CAD, Presenting for routine followup of his atrial fibrillation and chest pain symptoms.  Event monitor in April 2017 showing PVCs Stress test July 2016 showing no ischemia Echocardiogram July 2016 showing normal LV function  Takes 400 mg magnesium daily, PVC resolved Had chest pain this AM, less than one minute  rare episodes of chest pain. Typically this happens at rest He is otherwise active, goes swimming 3 days per week, walks daily typically with no symptoms of shortness of breath or chest pain.  Had tooth extracted  Denies any atrial fibrillation episodes  He has diltiazem for breakthrough atrial fibrillation  Total chol 160, LDL 106 in 05/2015  EKG on today's visit shows normal sinus rhythm with rate 65 bpm, no significant ST or T-wave changes   Other past medical history son went through  valve surgery in 2015 , this made him nervous about his own health Last stress test May 2015 showing  no ischemia  Previously had significant GI workup for discomfort, most of which has been negative. This included an EGD, abdominal ultrasound. He does take Tylenol and gabapentin.  chronic issue with sweats that seem to come on, unprovoked. Etiology is uncertain. It is not clear how much Xanax he is taking though he does report having a long history of anxiety.  Old lab work, cholesterol 162 LDL 100, HDL 40  PMH:   has a past medical history of Atypical chest pain; Chronic anticoagulation; Chronic anxiety; Diverticulosis; GERD (gastroesophageal reflux disease); Hypertension; IBS (irritable bowel syndrome); Nonischemic cardiomyopathy  ([place]); Paralyzed vocal cords; Paroxysmal atrial fibrillation ([place]); Proteinuria; and Skin cancer.  PSH:    Past Surgical History:  Procedure Laterality Date  . CARDIAC CATHETERIZATION    . cataract surgery Bilateral   . MASS BIOPSY  2013    Current Outpatient Prescriptions  Medication Sig Dispense Refill  . ALPRAZolam (XANAX) 0.25 MG tablet Take 0.25 mg by mouth as needed. Three times a week    . Cholecalciferol (VITAMIN D3) 400 UNITS CAPS Take 1 capsule by mouth daily.      . Coenzyme Q10 50 MG CAPS Take 1 capsule by mouth 2 (two) times daily.     . Cyanocobalamin (VITAMIN B-12 PO) Take 200 mg by mouth daily.    . Flaxseed, Linseed, (FLAX SEED OIL) 1000 MG CAPS Take 1 capsule by mouth daily.      . flecainide (TAMBOCOR) 50 MG tablet TAKE 1 & 1/2 TABLETS BY MOUTH TWICE DAILY 270 tablet 3  . GABAPENTIN, PHN, PO Take 200 mg by mouth daily.     . Magnesium 400 MG TABS Take 400 mg by mouth 2 (two) times daily. 60 tablet 11  . Multiple Vitamin (MULTIVITAMIN) tablet Take 1 tablet by mouth daily.      . nitroGLYCERIN (NITROSTAT) 0.4 MG SL tablet Place 1 tablet (0.4 mg total) under the tongue every 5 (five) minutes as needed for chest pain. 25 tablet 6  . Omega-3 Fatty Acids (FISH OIL) 1000 MG CAPS Take 1 capsule by mouth daily.      . pantoprazole (PROTONIX) 20 MG tablet Take 20 mg by mouth daily.    Marland Kitchen  ranitidine (ZANTAC) 150 MG tablet Take 150 mg by mouth daily.    . Tamsulosin HCl (FLOMAX) 0.4 MG CAPS Take 0.4 mg by mouth 2 (two) times daily.     . valsartan (DIOVAN) 40 MG tablet Take 0.5 tablets (20 mg total) by mouth daily. 90 tablet 3  . warfarin (COUMADIN) 2.5 MG tablet Take 2.5 mg by mouth daily.      No current facility-administered medications for this visit.      Allergies:   Lanoxin [digoxin]; Lopressor [metoprolol tartrate]; Sulfonamide derivatives; and Verapamil   Social History:  The patient  reports that he has never smoked. He has never used smokeless tobacco. He reports  that he drinks about 1.1 oz of alcohol per week . He reports that he does not use drugs.   Family History:   family history is not on file.    Review of Systems: Review of Systems  Constitutional: Negative.   Respiratory: Negative.   Cardiovascular: Positive for palpitations.  Gastrointestinal: Negative.   Musculoskeletal: Negative.   Neurological: Negative.   Psychiatric/Behavioral: Negative.   All other systems reviewed and are negative.    PHYSICAL EXAM: VS:  BP 130/70   Pulse 65   Ht 5\' 9"  (1.753 m)   Wt 192 lb 12.8 oz (87.5 kg)   BMI 28.47 kg/m  , BMI Body mass index is 28.47 kg/m. GEN: Well nourished, well developed, in no acute distress  HEENT: normal  Neck: no JVD, carotid bruits, or masses Cardiac: RRR; no murmurs, rubs, or gallops,no edema  Respiratory:  clear to auscultation bilaterally, normal work of breathing GI: soft, nontender, nondistended, + BS MS: no deformity or atrophy  Skin: warm and dry, no rash Neuro:  Strength and sensation are intact Psych: euthymic mood, full affect    Recent Labs: No results found for requested labs within last 8760 hours.    Lipid Panel No results found for: CHOL, HDL, LDLCALC, TRIG    Wt Readings from Last 3 Encounters:  10/22/16 192 lb 12.8 oz (87.5 kg)  04/30/16 187 lb (84.8 kg)  12/31/15 194 lb (88 kg)       ASSESSMENT AND PLAN:  HYPERTENSION, BENIGN Blood pressure is well controlled on today's visit. No changes made to the medications.  Paroxysmal atrial fibrillation (HCC) No events, On flecainide  Pure hypercholesterolemia Total chol 160, LDL 106 in 05/2015 Does not want a statin  PVCs Better on magnesium Did not want a b-blocker   Total encounter time more than 15 minutes  Greater than 50% was spent in counseling and coordination of care with the patient   Disposition:   F/U  52months    Signed, Esmond Plants, M.D., Ph.D. 10/22/2016  [place],  [place]

## 2016-10-22 NOTE — Patient Instructions (Signed)

## 2016-10-22 NOTE — Addendum Note (Signed)
Addended by: Dede Query R on: 10/22/2016 09:19 AM   Modules accepted: Orders

## 2016-11-11 ENCOUNTER — Telehealth: Payer: Self-pay | Admitting: Cardiovascular Disease

## 2016-11-11 NOTE — Telephone Encounter (Signed)
Dr. Lovie Macadamia would like to know if patient would benefit from a statin .  Please call Kim  At [facility].

## 2016-11-15 NOTE — Telephone Encounter (Signed)
Cholesterol is 160 No known CAD Unclear if he needs a statin

## 2017-03-26 ENCOUNTER — Telehealth: Payer: Self-pay | Admitting: Cardiovascular Disease

## 2017-03-26 ENCOUNTER — Ambulatory Visit (INDEPENDENT_AMBULATORY_CARE_PROVIDER_SITE_OTHER): Payer: Medicare Other | Admitting: Cardiovascular Disease

## 2017-03-26 ENCOUNTER — Encounter: Payer: Self-pay | Admitting: Cardiovascular Disease

## 2017-03-26 VITALS — BP 140/72 | HR 53 | Ht 69.0 in | Wt 192.8 lb

## 2017-03-26 DIAGNOSIS — F419 Anxiety disorder, unspecified: Secondary | ICD-10-CM | POA: Diagnosis not present

## 2017-03-26 DIAGNOSIS — I10 Essential (primary) hypertension: Secondary | ICD-10-CM

## 2017-03-26 DIAGNOSIS — R0789 Other chest pain: Secondary | ICD-10-CM

## 2017-03-26 DIAGNOSIS — K219 Gastro-esophageal reflux disease without esophagitis: Secondary | ICD-10-CM

## 2017-03-26 DIAGNOSIS — I480 Paroxysmal atrial fibrillation: Secondary | ICD-10-CM

## 2017-03-26 NOTE — Patient Instructions (Signed)

## 2017-03-26 NOTE — Telephone Encounter (Signed)
Spoke w/ pt.  He reports some mild, occasional chest discomfort/heaviness. Reports that he has not felt well since last Wed. He reports the sensation feels like indigestion and he has a poor appetite. He denies pain, SOB or diaphoresis. Pt has diverticulitis and he unsure if this is acting up. He has an appt w/ GI next Monday, but would like to make sure he does not have a heart issue. Pt added on to see Ignacia Bayley, NP on 03/30/17 @ 2:00, but advised him to proceed to the ED if his sx are emergent.

## 2017-03-26 NOTE — Progress Notes (Signed)
Cardiology Office Note  Date:  03/26/2017   ID:  Haadi Santellan, DOB 1933-09-12, MRN [id number]  PCP:  [name], MD   Chief Complaint  Patient presents with  . other    Patient c/o having chest discomfort. Meds reviewed verbally with patient.     HPI:  83 yo with history of paroxysmal atrial fibrillation, h/o Atypical chest pain,  catheterization in 2007 showing nonobstructive CAD,  Previous CT coronary calcium score 2010 score of 68, predominately in the circumflex vessel Non smoker Presenting for routine followup of his atrial fibrillation and chest pain symptoms.  Rare lightheaded, Loletha Grayer today rate 53 bpm  Reason for his visit is having periodic chest pain at rest, upper epigastric He is uncertain if it is chest or stomach Does not seem to happen on exertion Has not try to increase his proton pump inhibitor He is otherwise active, goes swimming 3 days per week, walks daily typically with no symptoms of shortness of breath or chest pain.  Lab work reviewed with him in detail HBA1C 5.8 Total chol 152, LDL 94  06/2009, Results reviewed with him Coronary Calcium Score: 68.2 (61.7 CFX, 6.5 LAD).  Low absolute coronary calcium score.   This places the patient in the 32nd percentile which is low risk overall.  EKG on today's visit shows sinus bradycardia rate 53 bpm no significant ST or T-wave changes  Other past medical history reviewed Event monitor in April 2017 showing PVCs Stress test July 2016 showing no ischemia Echocardiogram July 2016 showing normal LV function  Takes 400 mg magnesium daily, PVC resolved Had chest pain this AM, less than one minute  He has diltiazem for breakthrough atrial fibrillation  Total chol 160, LDL 106 in 05/2015  Other past medical history son went through valve surgery in 2015 , this made him nervous about his own health Last stress test May 2015 showing no ischemia  Previously had significant GI workup for  discomfort, most of which has been negative. This included an EGD, abdominal ultrasound. He does take Tylenol and gabapentin.  chronic issue with sweats that seem to come on, unprovoked. Etiology is uncertain. It is not clear how much Xanax he is taking though he does report having a long history of anxiety.  Old lab work, cholesterol 162 LDL 100, HDL 40   PMH:   has a past medical history of Atypical chest pain; Chronic anticoagulation; Chronic anxiety; Diverticulosis; GERD (gastroesophageal reflux disease); Hypertension; IBS (irritable bowel syndrome); Nonischemic cardiomyopathy ([place]); Paralyzed vocal cords; Paroxysmal atrial fibrillation ([place]); Proteinuria; and Skin cancer.  PSH:    Past Surgical History:  Procedure Laterality Date  . CARDIAC CATHETERIZATION    . cataract surgery Bilateral   . MASS BIOPSY  2013    Current Outpatient Prescriptions  Medication Sig Dispense Refill  . ALPRAZolam (XANAX) 0.25 MG tablet Take 0.25 mg by mouth as needed. Three times a week    . Cholecalciferol (VITAMIN D3) 400 UNITS CAPS Take 1 capsule by mouth daily.      . Coenzyme Q10 50 MG CAPS Take 1 capsule by mouth 2 (two) times daily.     . Cyanocobalamin (VITAMIN B-12 PO) Take 200 mg by mouth daily.    . Flaxseed, Linseed, (FLAX SEED OIL) 1000 MG CAPS Take 1 capsule by mouth daily.      . flecainide (TAMBOCOR) 50 MG tablet TAKE 1 & 1/2 TABLETS BY MOUTH TWICE DAILY 270 tablet 3  . GABAPENTIN, PHN, PO Take 200 mg  by mouth daily.     . Magnesium 400 MG TABS Take 400 mg by mouth 2 (two) times daily. 180 tablet 6  . Multiple Vitamin (MULTIVITAMIN) tablet Take 1 tablet by mouth daily.      . nitroGLYCERIN (NITROSTAT) 0.4 MG SL tablet Place 1 tablet (0.4 mg total) under the tongue every 5 (five) minutes as needed for chest pain. 25 tablet 6  . Omega-3 Fatty Acids (FISH OIL) 1000 MG CAPS Take 1 capsule by mouth daily.      . pantoprazole (PROTONIX) 20 MG tablet Take 20 mg by mouth daily.    .  ranitidine (ZANTAC) 150 MG tablet Take 150 mg by mouth daily.    . Tamsulosin HCl (FLOMAX) 0.4 MG CAPS Take 0.4 mg by mouth 2 (two) times daily.     . valsartan (DIOVAN) 40 MG tablet Take 0.5 tablets (20 mg total) by mouth daily. 90 tablet 3  . warfarin (COUMADIN) 2.5 MG tablet Take 2.5 mg by mouth daily.      No current facility-administered medications for this visit.      Allergies:   Lanoxin [digoxin]; Lopressor [metoprolol tartrate]; Sulfonamide derivatives; and Verapamil   Social History:  The patient  reports that he has never smoked. He has never used smokeless tobacco. He reports that he drinks about 1.1 oz of alcohol per week . He reports that he does not use drugs.   Family History:   family history is not on file.    Review of Systems: Review of Systems  Constitutional: Negative.   Respiratory: Negative.   Cardiovascular: Positive for chest pain.  Gastrointestinal: Negative.        Upper epigastric discomfort  Musculoskeletal: Negative.   Neurological: Negative.   Psychiatric/Behavioral: Negative.   All other systems reviewed and are negative.    PHYSICAL EXAM: VS:  BP 140/72 (BP Location: Left Arm, Patient Position: Sitting, Cuff Size: Normal)   Pulse (!) 53   Ht 5\' 9"  (1.753 m)   Wt 192 lb 12 oz (87.4 kg)   BMI 28.46 kg/m  , BMI Body mass index is 28.46 kg/m. GEN: Well nourished, well developed, in no acute distress  HEENT: normal  Neck: no JVD, carotid bruits, or masses Cardiac: RRR; no murmurs, rubs, or gallops,no edema  Respiratory:  clear to auscultation bilaterally, normal work of breathing GI: soft, nontender, nondistended, + BS MS: no deformity or atrophy  Skin: warm and dry, no rash Neuro:  Strength and sensation are intact Psych: euthymic mood, full affect    Recent Labs: No results found for requested labs within last 8760 hours.    Lipid Panel No results found for: CHOL, HDL, LDLCALC, TRIG    Wt Readings from Last 3 Encounters:   03/26/17 192 lb 12 oz (87.4 kg)  10/22/16 192 lb 12.8 oz (87.5 kg)  04/30/16 187 lb (84.8 kg)       ASSESSMENT AND PLAN:  Paroxysmal atrial fibrillation (HCC) - Plan: EKG 12-Lead Maintaining normal sinus rhythm, no symptoms concerning for arrhythmia We'll continue flecainide Asymptomatic bradycardia  Essential hypertension - Plan: EKG 12-Lead Blood pressure is well controlled on today's visit. No changes made to the medications.  Chest heaviness - Plan: EKG 12-Lead Atypical symptoms Discussed in detail with him, discussed previous cardiac testing including CT coronary calcium score, prior stress testing. Atypical in presentation. He has had prior episodes of same thing over the past several years. No further testing needed at this time  Gastroesophageal reflux disease without  esophagitis Recommended he consider increasing Protonix up to twice a day  Anxiety Recommended he continue his regular exercise program, reassured him on his general health   Total encounter time more than 25 minutes  Greater than 50% was spent in counseling and coordination of care with the patient   Disposition:   F/U  12 months   Orders Placed This Encounter  Procedures  . EKG 12-Lead     Signed, Esmond Plants, M.D., Ph.D. 03/26/2017  [place], [place]

## 2017-03-26 NOTE — Telephone Encounter (Signed)
Pt calling stating he may be having some indigestion but not sure would like to make sur it is not any heart issues. He is going to gastro doctor on Monday Please advise

## 2017-03-29 ENCOUNTER — Other Ambulatory Visit: Payer: Self-pay | Admitting: Gastroenterology

## 2017-03-29 ENCOUNTER — Ambulatory Visit: Payer: Medicare Other | Admitting: Nurse Practitioner

## 2017-03-29 ENCOUNTER — Ambulatory Visit
Admission: RE | Admit: 2017-03-29 | Discharge: 2017-03-29 | Disposition: A | Discharge status: Home / Self Care | Payer: Medicare Other | Source: Ambulatory Visit | Attending: Gastroenterology | Admitting: Gastroenterology

## 2017-03-29 DIAGNOSIS — K5900 Constipation, unspecified: Secondary | ICD-10-CM | POA: Diagnosis present

## 2017-03-29 DIAGNOSIS — N400 Enlarged prostate without lower urinary tract symptoms: Secondary | ICD-10-CM | POA: Insufficient documentation

## 2017-03-29 DIAGNOSIS — K5792 Diverticulitis of intestine, part unspecified, without perforation or abscess without bleeding: Secondary | ICD-10-CM | POA: Insufficient documentation

## 2017-03-29 DIAGNOSIS — R1084 Generalized abdominal pain: Secondary | ICD-10-CM

## 2017-03-29 LAB — POCT I-STAT CREATININE: Creatinine, Ser: 1.1 mg/dL (ref 0.61–1.24)

## 2017-03-29 MED ORDER — IOPAMIDOL (ISOVUE-300) INJECTION 61%
100.0000 mL | Freq: Once | INTRAVENOUS | Status: AC | PRN
  Administered 2017-03-29: 100 mL via INTRAVENOUS

## 2017-03-30 ENCOUNTER — Telehealth: Payer: Self-pay | Admitting: Cardiovascular Disease

## 2017-03-30 ENCOUNTER — Ambulatory Visit: Payer: Medicare Other | Admitting: Nurse Practitioner

## 2017-03-30 NOTE — Telephone Encounter (Signed)
Pt seen at [facility] yesterday for abdominal pain. PA would like to treat diverticulitis w/ cipro & flagyl, but there are interactions w/ flecainide & coumadin. She would like permission to continue w/ writing these meds or recommendation from Dr. Rockey Situ if there is an alternative that will not interact w/ these.

## 2017-03-30 NOTE — Telephone Encounter (Signed)
Can we call his pharmacist to get their advice Would prefer not to change his current cardiac medications

## 2017-03-30 NOTE — Telephone Encounter (Signed)
Thayer Headings Whittered PA at [facility] gastro calling asking about a medication interaction  She is treating patient for Diverticulitis Trying to prescribe patient cipro and flagy interacting flecainide also coumadin Would like to know if he can take those medications or if we have other recommendations

## 2017-03-31 NOTE — Telephone Encounter (Signed)
Spoke w/ Thayer Headings, [place].  Advised her of Dr. Donivan Scull recommendation.  She will call pt's pharmacy to discuss interactions and proceed after their conversation. Per her questions to [place], she will have pt's INR checked on day 3 after starting Flagyl, but will not adjust dose until this check. Asked her to call back w/ any further questions or concerns.

## 2017-05-06 ENCOUNTER — Ambulatory Visit (INDEPENDENT_AMBULATORY_CARE_PROVIDER_SITE_OTHER): Payer: Medicare Other | Admitting: Internal Medicine

## 2017-05-06 ENCOUNTER — Encounter: Payer: Self-pay | Admitting: Internal Medicine

## 2017-05-06 VITALS — BP 130/80 | HR 54 | Ht 69.0 in | Wt 188.5 lb

## 2017-05-06 DIAGNOSIS — I493 Ventricular premature depolarization: Secondary | ICD-10-CM

## 2017-05-06 DIAGNOSIS — I480 Paroxysmal atrial fibrillation: Secondary | ICD-10-CM

## 2017-05-06 DIAGNOSIS — I491 Atrial premature depolarization: Secondary | ICD-10-CM

## 2017-05-06 NOTE — Patient Instructions (Signed)
Medication Instructions: - Your physician recommends that you continue on your current medications as directed. Please refer to the Current Medication list given to you today.  Labwork: - none ordered  Procedures/Testing: - none ordered  Follow-Up: - Your physician wants you to follow-up in: 6 months with Dr. Rockey Situ & 1 year with Dr. [name]. You will receive a reminder letter in the mail two months in advance. If you don't receive a letter, please call our office to schedule the follow-up appointment.   Any Additional Special Instructions Will Be Listed Below (If Applicable).     If you need a refill on your cardiac medications before your next appointment, please call your pharmacy.

## 2017-05-06 NOTE — Progress Notes (Signed)
Patient Care Team: [name], MD as PCP - General (Family Medicine) Deboraha Sprang, MD as Consulting Physician (Cardiology) Minna Merritts, MD as Consulting Physician (Cardiology)   HPI  Robert Burch is a 83 y.o. male Seen in followup for paroxysmal atrial fibrillation in the setting of nonischemic coronary disease   His atrial fib is largely quiet currently with last episode in June 2012   He was referred to Dr Clearnce Hasten for consideration of pulm vein isolation; he declined ablation.  He is on warfarin and has no bleeding issues. His INR runs about 1.9--2  Supplemental magnesium and has kept most of his palpitations at day. The loose stool issue has resolved.  He denies chest pain or shortness of breath but he does have exercise associated lightheadedness which is relatively predictable. It is unassociated with palpitations. It is relieved rapidly with rest.  He denies edema orthopnea or nocturnal dyspnea  DATE TEST    7/16    Echo   EF 55-60 % Mild LAE  7/16    myoview   EF 35 % No ischemia-gut artifact         DATE PR interval QRSduration Dose-BID  3/10  150 114 50  5/18 180 114 75       Records and Results Reviewed notes from Dr. Deidre Ala Holter monitors  Past Medical History:  Diagnosis Date  . Atypical chest pain    a. Nonobstructive, very minor irregs by cath 2007;  b. 06/2009 Ex MV: small area of apical lateral ischemia;  c. 06/2009 Cath: essentially normal;  d. 04/2014 Lexiscan MV: EF 59%, no ischemia/infarct->low risk.  . Cancer ([place])   . Chronic anticoagulation    a. Chronic Coumadin  . Chronic anxiety   . Diverticulosis   . GERD (gastroesophageal reflux disease)   . Hypertension   . IBS (irritable bowel syndrome)   . Nonischemic cardiomyopathy ([place])    a. EF 45% by echo 2010;  b. 05/2012 Echo: EF 55-65%, no rwma, Gr 1 DD, mild AI/MR, mildly dil LA.  . Paralyzed vocal cords   . Paroxysmal atrial fibrillation ([place])    a. Treated with  flecainide  . Proteinuria    History of    Past Surgical History:  Procedure Laterality Date  . CARDIAC CATHETERIZATION    . cataract surgery Bilateral   . MASS BIOPSY  2013    Current Outpatient Prescriptions  Medication Sig Dispense Refill  . ALPRAZolam (XANAX) 0.25 MG tablet Take 0.25 mg by mouth as needed. Three times a week    . Cholecalciferol (VITAMIN D3) 400 UNITS CAPS Take 1 capsule by mouth daily.      . Coenzyme Q10 50 MG CAPS Take 1 capsule by mouth 2 (two) times daily.     . Cyanocobalamin (VITAMIN B-12 PO) Take 200 mg by mouth daily.    . Flaxseed, Linseed, (FLAX SEED OIL) 1000 MG CAPS Take 1 capsule by mouth daily.      . flecainide (TAMBOCOR) 50 MG tablet TAKE 1 & 1/2 TABLETS BY MOUTH TWICE DAILY 270 tablet 3  . GABAPENTIN, PHN, PO Take 200 mg by mouth daily.     . Magnesium 400 MG TABS Take 400 mg by mouth 2 (two) times daily. 180 tablet 6  . Multiple Vitamin (MULTIVITAMIN) tablet Take 1 tablet by mouth daily.      . nitroGLYCERIN (NITROSTAT) 0.4 MG SL tablet Place 1 tablet (0.4 mg total) under the tongue every  5 (five) minutes as needed for chest pain. 25 tablet 6  . Omega-3 Fatty Acids (FISH OIL) 1000 MG CAPS Take 1 capsule by mouth daily.      . pantoprazole (PROTONIX) 20 MG tablet Take 20 mg by mouth daily.    . ranitidine (ZANTAC) 150 MG tablet Take 150 mg by mouth daily.    . Tamsulosin HCl (FLOMAX) 0.4 MG CAPS Take 0.4 mg by mouth 2 (two) times daily.     . valsartan (DIOVAN) 40 MG tablet Take 0.5 tablets (20 mg total) by mouth daily. 90 tablet 3  . warfarin (COUMADIN) 2.5 MG tablet Take 2.5 mg by mouth daily.      No current facility-administered medications for this visit.     Allergies  Allergen Reactions  . Lanoxin [Digoxin]   . Lopressor [Metoprolol Tartrate]   . Sulfonamide Derivatives     Rash   . Verapamil       Review of Systems negative except from HPI and PMH  Physical Exam BP 130/80 (BP Location: Left Arm, Patient Position: Sitting,  Cuff Size: Normal)   Pulse (!) 54   Ht 5\' 9"  (1.753 m)   Wt 188 lb 8 oz (85.5 kg)   BMI 27.84 kg/m  Well developed and nourished in no acute distress HENT normal Neck supple with JVP-flat Clear Regular rate and rhythm, no murmurs or gallops Abd-soft with active BS No Clubbing cyanosis edema Skin-warm and dry A & Oriented  Grossly normal sensory and motor function     ECG demonstrates normal sinus rhythm at 54 18/11/43 Otherwise normal  Assessment and  Plan  Atrial fibrillation-paroxysmal  PACs/PVCs  Exertional lightheadedness  Sinus bradycardia  Palpitations are quiet.  On Anticoagulation;  No bleeding issues   No intercurrent atrial fibrillation or flutter  The issue of exertional lightheadedness is provocative. With his bradycardia which is gradually worsening now with resting rates in the low 50s down 10 bpm or so from the last 1-2 years raises the possibility of chronotropic incompetence. His QRS duration and PR interval are sufficiently normal at I think it is unlikely that he has exercise associated heart block.  I've encouraged him to take his pulse when he gets to the top of the driveway see what his heart rate is. If the symptoms worsen, and he is unable to clarify, treadmill testing would be appropriate  In this regard it is notable that he is on a 1C agent without adjunctive AV nodal blockade.

## 2017-06-22 ENCOUNTER — Other Ambulatory Visit: Payer: Self-pay | Admitting: Cardiovascular Disease

## 2017-11-06 DIAGNOSIS — I2510 Atherosclerotic heart disease of native coronary artery without angina pectoris: Secondary | ICD-10-CM | POA: Insufficient documentation

## 2017-11-06 NOTE — Progress Notes (Signed)
Cardiology Office Note  Date:  11/08/2017   ID:  Robert Burch, DOB 02/21/1933, MRN [id number]  PCP:  [name], MD   Chief Complaint  Patient presents with  . other    6 month follow up. Patient chest aches and shin pain in right leg. Meds reviewed verbally with patient.     HPI:  83 yo with history of paroxysmal atrial fibrillation, h/o Atypical chest pain,  catheterization in 2007 showing nonobstructive CAD,  Previous CT coronary calcium score in 2010 score of 68, predominately in the circumflex vessel Non smoker Presenting for routine followup of his atrial fibrillation and chest pain symptoms.  In the summer, having stomach pains After he ate Better on PPI, H2 blocker, carafate Now better  Having pain in his back, running down his legs, chronic right knee pain Wonders if it could be PAD  Reports he is taking Diovan 20 mg twice daily blood pressure well controlled at home  Swims 3 times a week, walks daily for exercise  Lab work reviewed with him in detail HBA1C 5.8 Total chol 152, LDL 94  EKG personally reviewed by myself on todays visit Shows sinus Brady 54 bpm no significant ST or T wave changes  Other past medical history reviewed 06/2009, Results reviewed with him Coronary Calcium Score: 68.2 (61.7 CFX, 6.5 LAD).  Low absolute coronary calcium score.   This places the patient in the 32nd percentile which is low risk overall.  Event monitor in April 2017 showing PVCs Stress test July 2016 showing no ischemia Echocardiogram July 2016 showing normal LV function  Takes 400 mg magnesium daily, PVC resolved Had chest pain this AM, less than one minute  He has diltiazem for breakthrough atrial fibrillation  Total chol 160, LDL 106 in 05/2015  Other past medical history son went through valve surgery in 2015 , this made him nervous about his own health Last stress test May 2015 showing no ischemia  Previously had significant GI workup  for discomfort, most of which has been negative. This included an EGD, abdominal ultrasound. He does take Tylenol and gabapentin.  chronic issue with sweats that seem to come on, unprovoked. Etiology is uncertain. It is not clear how much Xanax he is taking though he does report having a long history of anxiety.  Old lab work, cholesterol 162 LDL 100, HDL 40   PMH:   has a past medical history of Atypical chest pain, Cancer ([place]), Chronic anticoagulation, Chronic anxiety, Diverticulosis, GERD (gastroesophageal reflux disease), Hypertension, IBS (irritable bowel syndrome), Nonischemic cardiomyopathy ([place]), Paralyzed vocal cords, Paroxysmal atrial fibrillation ([place]), and Proteinuria.  PSH:    Past Surgical History:  Procedure Laterality Date  . CARDIAC CATHETERIZATION    . cataract surgery Bilateral   . MASS BIOPSY  2013    Current Outpatient Medications  Medication Sig Dispense Refill  . ALPRAZolam (XANAX) 0.25 MG tablet Take 0.25 mg by mouth as needed. Three times a week    . Cholecalciferol (VITAMIN D3) 400 UNITS CAPS Take 1 capsule by mouth daily.      . Coenzyme Q10 50 MG CAPS Take 1 capsule by mouth 2 (two) times daily.     . Cyanocobalamin (VITAMIN B-12 PO) Take 200 mg by mouth daily.    . diclofenac sodium (VOLTAREN) 1 % GEL Apply topically.    . Flaxseed, Linseed, (FLAX SEED OIL) 1000 MG CAPS Take 1 capsule by mouth daily.      . flecainide (TAMBOCOR) 50 MG tablet TAKE  1 & 1/2 TABLETS BY MOUTH TWICE DAILY 270 tablet 3  . GABAPENTIN, PHN, PO Take 200 mg by mouth daily.     . Magnesium 400 MG TABS Take 400 mg by mouth 2 (two) times daily. 180 tablet 6  . Multiple Vitamin (MULTIVITAMIN) tablet Take 1 tablet by mouth daily.      . nitroGLYCERIN (NITROSTAT) 0.4 MG SL tablet Place 1 tablet (0.4 mg total) under the tongue every 5 (five) minutes as needed for chest pain. 25 tablet 6  . Omega-3 Fatty Acids (FISH OIL) 1000 MG CAPS Take 1 capsule by mouth daily.      . pantoprazole  (PROTONIX) 20 MG tablet Take 20 mg by mouth daily.    . ranitidine (ZANTAC) 150 MG tablet Take 150 mg by mouth daily.    . sucralfate (CARAFATE) 1 g tablet Take 1 g 2 (two) times daily by mouth.  3  . Tamsulosin HCl (FLOMAX) 0.4 MG CAPS Take 0.4 mg by mouth 2 (two) times daily.     . valsartan (DIOVAN) 40 MG tablet Take 0.5 tablets (20 mg total) 2 (two) times daily by mouth. 90 tablet 3  . warfarin (COUMADIN) 2.5 MG tablet Take 2.5 mg by mouth daily.      No current facility-administered medications for this visit.      Allergies:   Lanoxin [digoxin]; Lopressor [metoprolol tartrate]; Sulfonamide derivatives; and Verapamil   Social History:  The patient  reports that  has never smoked. he has never used smokeless tobacco. He reports that he does not drink alcohol or use drugs.   Family History:   family history is not on file.    Review of Systems: Review of Systems  Constitutional: Negative.   Respiratory: Negative.   Cardiovascular: Negative.   Gastrointestinal: Negative.        Upper epigastric discomfort  Musculoskeletal: Positive for joint pain.       Leg pain  Neurological: Negative.   Psychiatric/Behavioral: Negative.   All other systems reviewed and are negative.    PHYSICAL EXAM: VS:  BP 119/73 (BP Location: Left Arm, Patient Position: Sitting, Cuff Size: Large)   Pulse (!) 53   Ht 5\' 9"  (1.753 m)   Wt 185 lb 12 oz (84.3 kg)   BMI 27.43 kg/m  , BMI Body mass index is 27.43 kg/m.  No significant change compared to prior office visit GEN: Well nourished, well developed, in no acute distress  HEENT: normal  Neck: no JVD, carotid bruits, or masses Cardiac: RRR; no murmurs, rubs, or gallops,no edema  Respiratory:  clear to auscultation bilaterally, normal work of breathing GI: soft, nontender, nondistended, + BS MS: no deformity or atrophy  Skin: warm and dry, no rash Neuro:  Strength and sensation are intact Psych: euthymic mood, full affect  Recent  Labs: 03/29/2017: Creatinine, Ser 1.10    Lipid Panel No results found for: CHOL, HDL, LDLCALC, TRIG    Wt Readings from Last 3 Encounters:  11/08/17 185 lb 12 oz (84.3 kg)  05/06/17 188 lb 8 oz (85.5 kg)  03/26/17 192 lb 12 oz (87.4 kg)      ASSESSMENT AND PLAN:  Paroxysmal atrial fibrillation (HCC) - Plan: EKG 12-Lead Maintaining normal sinus rhythm, no symptoms concerning for arrhythmia We'll continue flecainide 75 BID Asymptomatic bradycardia, stable  Essential hypertension - Plan: EKG 12-Lead Blood pressure is well controlled on today's visit. No changes made to the medications. Medications adjusted in the computer, stable  Chest heaviness - Plan:  EKG 12-Lead Long history of previous atypical symptoms, denies having symptoms recently No further testing needed  Gastroesophageal reflux disease without esophagitis Symptoms better on PPI, Carafate, H2 blocker  Anxiety Recommended he continue his regular exercise program,  Stable   Total encounter time more than 25 minutes  Greater than 50% was spent in counseling and coordination of care with the patient   Disposition:   F/U  12 months   Orders Placed This Encounter  Procedures  . EKG 12-Lead     Signed, Esmond Plants, M.D., Ph.D. 11/08/2017  [place], [place]

## 2017-11-08 ENCOUNTER — Encounter: Payer: Self-pay | Admitting: Cardiovascular Disease

## 2017-11-08 ENCOUNTER — Ambulatory Visit: Payer: Medicare Other | Admitting: Cardiovascular Disease

## 2017-11-08 VITALS — BP 119/73 | HR 53 | Ht 69.0 in | Wt 185.8 lb

## 2017-11-08 DIAGNOSIS — R0789 Other chest pain: Secondary | ICD-10-CM | POA: Diagnosis not present

## 2017-11-08 DIAGNOSIS — R55 Syncope and collapse: Secondary | ICD-10-CM

## 2017-11-08 DIAGNOSIS — I10 Essential (primary) hypertension: Secondary | ICD-10-CM | POA: Diagnosis not present

## 2017-11-08 DIAGNOSIS — I480 Paroxysmal atrial fibrillation: Secondary | ICD-10-CM | POA: Diagnosis not present

## 2017-11-08 DIAGNOSIS — I2510 Atherosclerotic heart disease of native coronary artery without angina pectoris: Secondary | ICD-10-CM

## 2017-11-08 NOTE — Patient Instructions (Signed)

## 2017-11-25 ENCOUNTER — Other Ambulatory Visit: Payer: Self-pay | Admitting: Cardiovascular Disease

## 2018-05-09 ENCOUNTER — Other Ambulatory Visit: Payer: Self-pay | Admitting: Gastroenterology

## 2018-05-09 DIAGNOSIS — K5792 Diverticulitis of intestine, part unspecified, without perforation or abscess without bleeding: Secondary | ICD-10-CM

## 2018-05-10 ENCOUNTER — Ambulatory Visit
Admission: RE | Admit: 2018-05-10 | Discharge: 2018-05-10 | Disposition: A | Discharge status: Home / Self Care | Payer: Medicare Other | Source: Ambulatory Visit | Attending: Gastroenterology | Admitting: Gastroenterology

## 2018-05-10 DIAGNOSIS — N400 Enlarged prostate without lower urinary tract symptoms: Secondary | ICD-10-CM | POA: Diagnosis not present

## 2018-05-10 DIAGNOSIS — K5792 Diverticulitis of intestine, part unspecified, without perforation or abscess without bleeding: Secondary | ICD-10-CM

## 2018-05-10 DIAGNOSIS — R918 Other nonspecific abnormal finding of lung field: Secondary | ICD-10-CM | POA: Diagnosis not present

## 2018-05-10 DIAGNOSIS — I700 Atherosclerosis of aorta: Secondary | ICD-10-CM | POA: Diagnosis not present

## 2018-05-10 DIAGNOSIS — K630 Abscess of intestine: Secondary | ICD-10-CM | POA: Diagnosis not present

## 2018-05-10 MED ORDER — IOPAMIDOL (ISOVUE-300) INJECTION 61%
100.0000 mL | Freq: Once | INTRAVENOUS | Status: AC | PRN
  Administered 2018-05-10: 100 mL via INTRAVENOUS

## 2018-05-12 ENCOUNTER — Encounter: Payer: Self-pay | Admitting: Internal Medicine

## 2018-05-12 ENCOUNTER — Ambulatory Visit: Payer: Medicare Other | Admitting: Internal Medicine

## 2018-05-12 VITALS — BP 158/82 | HR 55 | Ht 70.0 in | Wt 188.5 lb

## 2018-05-12 DIAGNOSIS — R001 Bradycardia, unspecified: Secondary | ICD-10-CM | POA: Diagnosis not present

## 2018-05-12 DIAGNOSIS — I493 Ventricular premature depolarization: Secondary | ICD-10-CM

## 2018-05-12 DIAGNOSIS — I480 Paroxysmal atrial fibrillation: Secondary | ICD-10-CM

## 2018-05-12 DIAGNOSIS — I491 Atrial premature depolarization: Secondary | ICD-10-CM | POA: Diagnosis not present

## 2018-05-12 NOTE — Patient Instructions (Signed)

## 2018-05-12 NOTE — Progress Notes (Signed)
Patient Care Team: [name], MD as PCP - General (Family Medicine) Deboraha Sprang, MD as Consulting Physician (Cardiology) Minna Merritts, MD as Consulting Physician (Cardiology)   HPI  Robert Burch is a 84 y.o. male Seen in followup for paroxysmal atrial fibrillation in the setting of nonischemic coronary disease; he has been managed with flecainide   His atrial fib is largely quiet currently with last episode in June 2012   He was referred to Dr Clearnce Hasten for consideration of pulm vein isolation; he declined ablation.  No interval atrial fib on flecainide and magnesium  He is on warfarin  Without bleeding   Recurrent diverticulitis  On ABx reviewed drug interactions at his request.  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 Deidre Ala Holter monitors  Past Medical History:  Diagnosis Date  . Atypical chest pain    a. Nonobstructive, very minor irregs by cath 2007;  b. 06/2009 Ex MV: small area of apical lateral ischemia;  c. 06/2009 Cath: essentially normal;  d. 04/2014 Lexiscan MV: EF 59%, no ischemia/infarct->low risk.  . Cancer ([place])   . Chronic anticoagulation    a. Chronic Coumadin  . Chronic anxiety   . Diverticulosis   . GERD (gastroesophageal reflux disease)   . Hypertension   . IBS (irritable bowel syndrome)   . Nonischemic cardiomyopathy ([place])    a. EF 45% by echo 2010;  b. 05/2012 Echo: EF 55-65%, no rwma, Gr 1 DD, mild AI/MR, mildly dil LA.  . Paralyzed vocal cords    . Paroxysmal atrial fibrillation ([place])    a. Treated with flecainide  . Proteinuria    History of    Past Surgical History:  Procedure Laterality Date  . CARDIAC CATHETERIZATION    . cataract surgery Bilateral   . MASS BIOPSY  2013    Current Outpatient Medications  Medication Sig Dispense Refill  . ALPRAZolam (XANAX) 0.25 MG tablet Take 0.25 mg by mouth as needed. Three times a week    . Cholecalciferol (VITAMIN D3) 400 UNITS CAPS Take 1 capsule by mouth daily.      . ciprofloxacin (CIPRO) 500 MG tablet Take 500 mg by mouth 2 (two) times daily.     . Coenzyme Q10 50 MG CAPS Take 1 capsule by mouth 2 (two) times daily.     . Cyanocobalamin (VITAMIN B-12 PO) Take 200 mg by mouth daily.    . diclofenac sodium (VOLTAREN) 1 % GEL Apply topically.    . Flaxseed, Linseed, (FLAX SEED OIL) 1000 MG CAPS Take 1 capsule by mouth daily.      . flecainide (TAMBOCOR) 50 MG tablet TAKE 1 & 1/2 TABLETS BY MOUTH TWICE DAILY 270 tablet 3  . GABAPENTIN, PHN, PO Take 200 mg by mouth as needed.     Marland Kitchen  magnesium oxide (MAG-OX) 400 (241.3 Mg) MG tablet TAKE 1 TABLET BY MOUTH TWICE A DAY 180 tablet 3  . metroNIDAZOLE (FLAGYL) 500 MG tablet Take 500 mg by mouth 3 (three) times daily.     . Multiple Vitamin (MULTIVITAMIN) tablet Take 1 tablet by mouth daily.      . nitroGLYCERIN (NITROSTAT) 0.4 MG SL tablet Place 1 tablet (0.4 mg total) under the tongue every 5 (five) minutes as needed for chest pain. 25 tablet 6  . Omega-3 Fatty Acids (FISH OIL) 1000 MG CAPS Take 1 capsule by mouth daily.      . pantoprazole (PROTONIX) 20 MG tablet Take 20 mg by mouth daily.    . ranitidine (ZANTAC) 150 MG tablet Take 150 mg by mouth daily.    . sucralfate (CARAFATE) 1 g tablet Take 1 g 2 (two) times daily by mouth.  3  . Tamsulosin HCl (FLOMAX) 0.4 MG CAPS Take 0.4 mg by mouth 2 (two) times daily.     . valsartan (DIOVAN) 40 MG tablet Take 20 mg by mouth daily.    Marland Kitchen warfarin (COUMADIN) 2.5 MG tablet Take 2.5 mg by mouth  daily.      No current facility-administered medications for this visit.     Allergies  Allergen Reactions  . Lanoxin [Digoxin]   . Lopressor [Metoprolol Tartrate]   . Sulfonamide Derivatives     Rash   . Verapamil       Review of Systems negative except from HPI and PMH  Physical Exam BP (!) 158/82 (BP Location: Left Arm, Patient Position: Sitting, Cuff Size: Normal)   Pulse (!) 55   Ht 5\' 10"  (1.778 m)   Wt 188 lb 8 oz (85.5 kg)   BMI 27.05 kg/m  Well developed and nourished in no acute distress HENT normal Neck supple with JVP-flat Clear Regular rate and rhythm, no murmurs or gallops Abd-soft with active BS No Clubbing cyanosis edema Skin-warm and dry A & Oriented  Grossly normal sensory and motor function      ECG demonstrates sinus @ 55 18/12/43 \ Assessment and  Plan  Atrial fibrillation-paroxysmal  PACs/PVCs  Sinus bradycardia  Diverticulitis  On Anticoagulation;  No bleeding issues   No attributable symptoms to bradycardia  Reviewed drug interactions As above  Will need K followed   We spent more than 50% of our >25 min visit in face to face counseling regarding the above

## 2018-07-02 ENCOUNTER — Other Ambulatory Visit: Payer: Self-pay | Admitting: Cardiovascular Disease

## 2018-08-08 ENCOUNTER — Emergency Department: Payer: Medicare Other

## 2018-08-08 ENCOUNTER — Telehealth: Payer: Self-pay | Admitting: Cardiovascular Disease

## 2018-08-08 ENCOUNTER — Other Ambulatory Visit: Payer: Self-pay

## 2018-08-08 ENCOUNTER — Encounter: Payer: Self-pay | Admitting: *Deleted

## 2018-08-08 ENCOUNTER — Emergency Department
Admission: EM | Admit: 2018-08-08 | Discharge: 2018-08-08 | Disposition: A | Discharge status: Home / Self Care | Payer: Medicare Other | Attending: Emergency Medicine | Admitting: Emergency Medicine

## 2018-08-08 DIAGNOSIS — R079 Chest pain, unspecified: Secondary | ICD-10-CM | POA: Diagnosis not present

## 2018-08-08 DIAGNOSIS — K2970 Gastritis, unspecified, without bleeding: Secondary | ICD-10-CM

## 2018-08-08 DIAGNOSIS — Z79899 Other long term (current) drug therapy: Secondary | ICD-10-CM | POA: Diagnosis not present

## 2018-08-08 DIAGNOSIS — I10 Essential (primary) hypertension: Secondary | ICD-10-CM | POA: Diagnosis not present

## 2018-08-08 DIAGNOSIS — Z7901 Long term (current) use of anticoagulants: Secondary | ICD-10-CM | POA: Diagnosis not present

## 2018-08-08 LAB — BASIC METABOLIC PANEL
Anion gap: 7 (ref 5–15)
BUN: 13 mg/dL (ref 8–23)
CHLORIDE: 99 mmol/L (ref 98–111)
CO2: 26 mmol/L (ref 22–32)
CREATININE: 0.92 mg/dL (ref 0.61–1.24)
Calcium: 8.8 mg/dL — ABNORMAL LOW (ref 8.9–10.3)
GFR calc Af Amer: >60 mL/min (ref >60)
GFR calc non Af Amer: >60 mL/min (ref >60)
Glucose, Bld: 109 mg/dL — ABNORMAL HIGH (ref 70–99)
POTASSIUM: 4.7 mmol/L (ref 3.5–5.1)
SODIUM: 132 mmol/L — AB (ref 135–145)

## 2018-08-08 LAB — CBC
HEMATOCRIT: 41.4 % (ref 40.0–52.0)
Hemoglobin: 14 g/dL (ref 13.0–18.0)
MCH: 27.9 pg (ref 26.0–34.0)
MCHC: 33.7 g/dL (ref 32.0–36.0)
MCV: 82.8 fL (ref 80.0–100.0)
PLATELETS: 266 10*3/uL (ref 150–440)
RBC: 5.01 MIL/uL (ref 4.40–5.90)
RDW: 17.5 % — AB (ref 11.5–14.5)
WBC: 7.8 10*3/uL (ref 3.8–10.6)

## 2018-08-08 LAB — TROPONIN I
Troponin I: <0.03 ng/mL (ref <0.03)
Troponin I: <0.03 ng/mL (ref <0.03)

## 2018-08-08 MED ORDER — IRBESARTAN 75 MG PO TABS
37.5000 mg | ORAL_TABLET | Freq: Every day | ORAL | Status: DC
  Administered 2018-08-08 (×2): 37.5 mg via ORAL
  Filled 2018-08-08: qty 0.5

## 2018-08-08 MED ORDER — GI COCKTAIL ~~LOC~~
30.0000 mL | Freq: Once | ORAL | Status: AC
  Administered 2018-08-08: 30 mL via ORAL
  Filled 2018-08-08: qty 30

## 2018-08-08 NOTE — Discharge Instructions (Addendum)
Please seek medical attention for any high fevers, chest pain, shortness of breath, change in behavior, persistent vomiting, bloody stool or any other new or concerning symptoms.  

## 2018-08-08 NOTE — Telephone Encounter (Signed)
Pt c/o of Chest Pain: STAT if CP now or developed within 24 hours  1. Are you having CP right now? Yes   2. Are you experiencing any other symptoms (ex. SOB, nausea, vomiting, sweating)?  Sweating   3. How long have you been experiencing CP? Since Sunday   4. Is your CP continuous or coming and going? Feels like a pulled muscle varies in intensity heavy ache   5. Have you taken Nitroglycerin? No , patient bottle is 84 years old not sure if expired  ?

## 2018-08-08 NOTE — ED Provider Notes (Signed)
[facility] Emergency Department Provider Note   ____________________________________________   I have reviewed the triage vital signs and the nursing notes.   HISTORY  Chief Complaint Chest Pain   History limited by: Not Limited   HPI Robert Burch is a 84 y.o. male who presents to the emergency department today because of concerns for chest pain.  The chest pain started yesterday.  Located below the left breast.  He states that it started shortly after he went on a walk yesterday morning.  It then got better.  The pain recurred again today.  Patient denies any radiation of the pain to his neck or arm.  He denies any associated shortness of breath.  No nausea or vomiting.  Denies any recent fevers.   Per medical record review patient has a history of atypical chest pain  Past Medical History:  Diagnosis Date  . Atypical chest pain    a. Nonobstructive, very minor irregs by cath 2007;  b. 06/2009 Ex MV: small area of apical lateral ischemia;  c. 06/2009 Cath: essentially normal;  d. 04/2014 Lexiscan MV: EF 59%, no ischemia/infarct->low risk.  . Cancer ([place])   . Chronic anticoagulation    a. Chronic Coumadin  . Chronic anxiety   . Diverticulosis   . GERD (gastroesophageal reflux disease)   . Hypertension   . IBS (irritable bowel syndrome)   . Nonischemic cardiomyopathy ([place])    a. EF 45% by echo 2010;  b. 05/2012 Echo: EF 55-65%, no rwma, Gr 1 DD, mild AI/MR, mildly dil LA.  . Paralyzed vocal cords   . Paroxysmal atrial fibrillation ([place])    a. Treated with flecainide  . Proteinuria    History of    Patient Active Problem List   Diagnosis Date Noted  . Coronary artery calcification seen on CAT scan 11/06/2017  . Paroxysmal atrial fibrillation ([facility])   . Atypical chest pain   . GERD (gastroesophageal reflux disease)   . Anxiety 10/17/2013  . Syncope and collapse 12/07/2011  . Snoring 07/02/2011  . HYPERTENSION, BENIGN 07/22/2009    Past  Surgical History:  Procedure Laterality Date  . CARDIAC CATHETERIZATION    . cataract surgery Bilateral   . MASS BIOPSY  2013    Prior to Admission medications   Medication Sig Start Date End Date Taking? Authorizing Provider  ALPRAZolam (XANAX) 0.25 MG tablet Take 0.25 mg by mouth as needed. Three times a week    [provider]  Cholecalciferol (VITAMIN D3) 400 UNITS CAPS Take 1 capsule by mouth daily.      [provider]  Coenzyme Q10 50 MG CAPS Take 1 capsule by mouth 2 (two) times daily.     [provider]  Cyanocobalamin (VITAMIN B-12 PO) Take 200 mg by mouth daily.    [provider]  diclofenac sodium (VOLTAREN) 1 % GEL Apply topically. 09/23/17   [provider]  Flaxseed, Linseed, (FLAX SEED OIL) 1000 MG CAPS Take 1 capsule by mouth daily.      [provider]  flecainide (TAMBOCOR) 50 MG tablet TAKE 1 & 1/2 TABLETS BY MOUTH TWICE DAILY 07/04/18   Minna Merritts, MD  GABAPENTIN, PHN, PO Take 200 mg by mouth as needed.     [provider]  magnesium oxide (MAG-OX) 400 (241.3 Mg) MG tablet TAKE 1 TABLET BY MOUTH TWICE A DAY 11/25/17   Minna Merritts, MD  Multiple Vitamin (MULTIVITAMIN) tablet Take 1 tablet by mouth daily.  [provider]  nitroGLYCERIN (NITROSTAT) 0.4 MG SL tablet Place 1 tablet (0.4 mg total) under the tongue every 5 (five) minutes as needed for chest pain. 04/24/14   Theora Gianotti, NP  Omega-3 Fatty Acids (FISH OIL) 1000 MG CAPS Take 1 capsule by mouth daily.      [provider]  pantoprazole (PROTONIX) 20 MG tablet Take 20 mg by mouth daily.    [provider]  ranitidine (ZANTAC) 150 MG tablet Take 150 mg by mouth daily.    [provider]  sucralfate (CARAFATE) 1 g tablet Take 1 g 2 (two) times daily by mouth. 10/17/17   [provider]  Tamsulosin HCl (FLOMAX) 0.4 MG CAPS Take 0.4 mg by mouth 2 (two) times daily.     [provider]  valsartan (DIOVAN) 40 MG tablet Take 20 mg by mouth daily.    [provider]  warfarin (COUMADIN) 2.5 MG tablet Take 2.5 mg by mouth daily.     [provider]    Allergies Lanoxin [digoxin]; Lopressor [metoprolol tartrate]; Sulfonamide derivatives; and Verapamil  Family History  Problem Relation Age of Onset  . Heart attack Neg Hx     Social History Social History   Tobacco Use  . Smoking status: Never Smoker  . Smokeless tobacco: Never Used  Substance Use Topics  . Alcohol use: No    Alcohol/week: 2.0 standard drinks    Types: 1 Glasses of wine, 1 Standard drinks or equivalent per week    Comment: occassionally  . Drug use: No    Review of Systems Constitutional: No fever/chills Eyes: No visual changes. ENT: No sore throat. Cardiovascular: Positive for chest pain  Respiratory: Denies shortness of breath. Gastrointestinal: No abdominal pain.  No nausea, no vomiting.  No diarrhea.   Genitourinary: Negative for dysuria. Musculoskeletal: Negative for back pain. Skin: Negative for rash. Neurological: Negative for headaches, focal weakness or numbness.  ____________________________________________   PHYSICAL EXAM:  VITAL SIGNS: ED Triage Vitals  Enc Vitals Group     BP 08/08/18 1531 (!) 176/77     Pulse Rate 08/08/18 1531 (!) 59     Resp 08/08/18 1531 20     Temp 08/08/18 1531 98.6 F (37 C)     Temp Source 08/08/18 1531 Oral     SpO2 08/08/18 1531 99 %     Weight 08/08/18 1532 194 lb (88 kg)     Height 08/08/18 1532 5\' 9"  (1.753 m)     Head Circumference --      Peak Flow --      Pain Score 08/08/18 1532 3    Constitutional: Alert and oriented.  Eyes: Conjunctivae are normal.  ENT      Head: Normocephalic and atraumatic.      Nose: No congestion/rhinnorhea.      Mouth/Throat: Mucous membranes are moist.      Neck: No stridor. Hematological/Lymphatic/Immunilogical: No cervical lymphadenopathy. Cardiovascular: Normal  rate, regular rhythm.  No murmurs, rubs, or gallops.  Respiratory: Normal respiratory effort without tachypnea nor retractions. Breath sounds are clear and equal bilaterally. No wheezes/rales/rhonchi. Gastrointestinal: Soft and non tender. No rebound. No guarding.  Genitourinary: Deferred Musculoskeletal: Normal range of motion in all extremities. No lower extremity edema. Neurologic:  Normal speech and language. No gross focal neurologic deficits are appreciated.  Skin:  Skin is warm, dry and intact. No rash noted. Psychiatric: Mood and affect are normal. Speech and behavior are normal. Patient exhibits appropriate insight and judgment.  ____________________________________________    LABS (pertinent positives/negatives)  Trop <0.03 x 2 CBC wbc 7.8, hgb 14.0, plt 266 BMP na 132, k 4.7, glu 109, cr 0.92  ____________________________________________   EKG  I, Nance Pear, attending physician, personally viewed and interpreted this EKG  EKG Time: 1528 Rate: 58 Rhythm: sinus bradycardia Axis: left axis deviation Intervals: qtc 418 QRS: narrow ST changes: no st elevation Impression: abnormal ekg   ____________________________________________    RADIOLOGY  CXR No acute disease  ____________________________________________   PROCEDURES  Procedures  ____________________________________________   INITIAL IMPRESSION / ASSESSMENT AND PLAN / ED COURSE  Pertinent labs & imaging results that were available during my care of the patient were reviewed by me and considered in my medical decision making (see chart for details).   Presented to the emergency department today because of concerns for left lower chest pain.  Differential would include pneumonia, pneumothorax, PE, ACS, gastritis, zoster amongst other etiologies.  Troponin negative x2.  Patient did feel some relief after GI cocktail.  Patient states that he has a prescription for sucralfate but has not been  taking it.  Does have Protonix and has been taking that.  At this point I think gastritis likely.  Discussed with patient about restarting sucralfate and diet changes.  Discussed importance of follow-up.  ____________________________________________   FINAL CLINICAL IMPRESSION(S) / ED DIAGNOSES  Final diagnoses:  Nonspecific chest pain  Gastritis, presence of bleeding unspecified, unspecified chronicity, unspecified gastritis type     Note: This dictation was prepared with Dragon dictation. Any transcriptional errors that result from this process are unintentional     Nance Pear, MD 08/08/18 2050

## 2018-08-08 NOTE — Telephone Encounter (Signed)
Received incoming call from patient.  He started having chest pain yesterday that was on and off. He described it as "achy" and he had sweating along with it. It finally went away yesterday afternoon. Today, he's had the pain again "4" out of 10 pain level. It continues to come and go and does not let up. Denies jaw pain, left arm pain, or shortness of breath. His Nitro is out of date. He says it almost feels like a pulled muscle but he had not recall doing activity that may have caused that. Discussed symptoms with our PA, Thurmond Butts who advised patient should go to the ER for evaluation. Patient verbalized understanding of these instructions.

## 2018-08-08 NOTE — ED Triage Notes (Signed)
Pt ambulatory to triage   Pt has chest pain since yesterday.  No sob.  nonradiaiting pain.  No n/v/  Pt alert.

## 2018-08-09 ENCOUNTER — Telehealth: Payer: Self-pay | Admitting: Cardiovascular Disease

## 2018-08-09 MED ORDER — NITROGLYCERIN 0.4 MG SL SUBL: 0.4000 mg | SUBLINGUAL_TABLET | SUBLINGUAL | 1 refills | Status: DC | PRN

## 2018-08-09 NOTE — Telephone Encounter (Signed)
Patient calling with update. He was told ED visit was not card related issue but a GI issue.

## 2018-08-09 NOTE — Telephone Encounter (Signed)
°*  STAT* If patient is at the pharmacy, call can be transferred to refill team.   1. Which medications need to be refilled? (please list name of each medication and dose if known) Nitro   2. Which pharmacy/location (including street and city if local pharmacy) is medication to be sent to? [facility]  3. Do they need a 30 day or 90 day supply?[place]

## 2018-08-09 NOTE — Telephone Encounter (Signed)
Call returned to the patient. He stated that he is feeling better. An appointment has been made for 09/06/18 with Dr. Rockey Situ, per the patient request.

## 2018-08-25 ENCOUNTER — Ambulatory Visit: Payer: Self-pay

## 2018-08-26 ENCOUNTER — Ambulatory Visit (INDEPENDENT_AMBULATORY_CARE_PROVIDER_SITE_OTHER): Payer: Medicare Other | Admitting: Urology

## 2018-08-26 ENCOUNTER — Encounter: Payer: Self-pay | Admitting: Urology

## 2018-08-26 VITALS — BP 135/79 | HR 62 | Ht 69.0 in | Wt 187.0 lb

## 2018-08-26 DIAGNOSIS — R972 Elevated prostate specific antigen [PSA]: Secondary | ICD-10-CM | POA: Diagnosis not present

## 2018-08-26 DIAGNOSIS — N401 Enlarged prostate with lower urinary tract symptoms: Secondary | ICD-10-CM | POA: Diagnosis not present

## 2018-08-26 DIAGNOSIS — N138 Other obstructive and reflux uropathy: Secondary | ICD-10-CM | POA: Diagnosis not present

## 2018-08-26 LAB — URINALYSIS, COMPLETE
Bilirubin, UA: NEGATIVE
GLUCOSE, UA: NEGATIVE
Ketones, UA: NEGATIVE
Leukocytes, UA: NEGATIVE
Nitrite, UA: NEGATIVE
PH UA: 7.5 (ref 5.0–7.5)
Specific Gravity, UA: 1.015 (ref 1.005–1.030)
UUROB: 0.2 mg/dL (ref 0.2–1.0)

## 2018-08-26 LAB — MICROSCOPIC EXAMINATION
Epithelial Cells (non renal): NONE SEEN /hpf (ref 0–10)
WBC, UA: NONE SEEN /hpf (ref 0–5)

## 2018-08-26 NOTE — Progress Notes (Signed)
08/26/2018 3:06 PM   Robert Burch February 26, 1933 [id number]  Referring provider: [name], MD [phone], [place] [address]  CC: BPH/LUTS  HPI: I the pleasure of seeing Robert Burch in urology clinic today.  He is an 84 year old healthy male with history of atrial fibrillation on Coumadin, and urinary symptoms moderately controlled on Flomax.  He was previously followed by Dr. Jacqlyn Larsen.  His primary urinary symptoms are nocturia 3-4 times per night as well as a weak stream.  CT scan in May 2019 demonstrated a large 125 cc prostate.  Dr. Jacqlyn Larsen previously been monitoring his PSA, which was last checked in February 2019 and was 2.28.  He does drink coffee and tea throughout the day, including tea in the evenings.  He denies any gross hematuria or flank pain.   PMH: Past Medical History:  Diagnosis Date  . Atypical chest pain    a. Nonobstructive, very minor irregs by cath 2007;  b. 06/2009 Ex MV: small area of apical lateral ischemia;  c. 06/2009 Cath: essentially normal;  d. 04/2014 Lexiscan MV: EF 59%, no ischemia/infarct->low risk.  . Cancer ([place])   . Chronic anticoagulation    a. Chronic Coumadin  . Chronic anxiety   . Diverticulosis   . GERD (gastroesophageal reflux disease)   . Hypertension   . IBS (irritable bowel syndrome)   . Nonischemic cardiomyopathy ([place])    a. EF 45% by echo 2010;  b. 05/2012 Echo: EF 55-65%, no rwma, Gr 1 DD, mild AI/MR, mildly dil LA.  . Paralyzed vocal cords   . Paroxysmal atrial fibrillation ([place])    a. Treated with flecainide  . Proteinuria    History of    Surgical History: Past Surgical History:  Procedure Laterality Date  . CARDIAC CATHETERIZATION    . cataract surgery Bilateral   . MASS BIOPSY  2013     Allergies:  Allergies  Allergen Reactions  . Lanoxin [Digoxin]   . Lopressor [Metoprolol Tartrate]   . Sulfonamide Derivatives     Rash   . Verapamil     Family History: Family History  Problem Relation Age  of Onset  . Heart attack Neg Hx     Social History:  reports that he has never smoked. He has never used smokeless tobacco. He reports that he does not drink alcohol or use drugs.  ROS: Please see flowsheet from today's date for complete review of systems.  Physical Exam: BP 135/79   Pulse 62   Ht 5\' 9"  (1.753 m)   Wt 187 lb (84.8 kg)   BMI 27.62 kg/m    Constitutional:  Alert and oriented, No acute distress. Cardiovascular: No clubbing, cyanosis, or edema. Respiratory: Normal respiratory effort, no increased work of breathing. GI: Abdomen is soft, nontender, nondistended, no abdominal masses GU: No CVA tenderness Lymph: No cervical or inguinal lymphadenopathy. Skin: No rashes, bruises or suspicious lesions. Neurologic: Grossly intact, no focal deficits, moving all 4 extremities. Psychiatric: Normal mood and affect.  Laboratory Data: PSA 01/2018: 2.28  Pertinent Imaging: I have personally reviewed the CT A/P w/contrast 05/10/2018: Prostate volume 125cc  Assessment & Plan:   In summary, Robert Burch is an 84 year old male with atrial fibrillation on Coumadin, and moderately controlled urinary symptoms on Flomax.  He has a large 125 cc gland on recent CT scan.  We discussed that we can discontinue PSA screening secondary to his age and comorbidities, and he is in agreement.  We also had a long discussion  about his BPH and lower urinary tract symptoms, and he would like to try finasteride.  We also discussed surgical options including HOLEP.  I informed him he would not be a candidate for UroLift secondary to his gland size greater than 80 cc.  He will follow-up in 8 months for an IPSS score and PVR, and rediscuss HOLEP if he feels his urinary symptoms are still not well controlled.  Billey Co, [facility] [address] [place], [place] [address] [phone]

## 2018-08-29 ENCOUNTER — Telehealth: Payer: Self-pay | Admitting: Urology

## 2018-08-29 MED ORDER — FINASTERIDE 5 MG PO TABS: 5.0000 mg | ORAL_TABLET | Freq: Every day | ORAL | 11 refills | Status: DC

## 2018-08-29 NOTE — Telephone Encounter (Signed)
Script sent  

## 2018-08-29 NOTE — Telephone Encounter (Signed)
Pt calls office stating he was seen Friday Sept 06, 2019, pt states he was told a Rx for finesteride would be sent to his pharmacy, however over the weekend the pharmacy hasn't received a Rx for Finesteride or anything from Dr. Versie Starks, Pt asks to please have this sent [facility] on [address]. Please contact pt when this has been sent. Thank you.

## 2018-08-29 NOTE — Addendum Note (Signed)
Addended by: Tommy Rainwater on: 08/29/2018 04:04 PM   Modules accepted: Orders

## 2018-09-03 ENCOUNTER — Other Ambulatory Visit: Payer: Self-pay | Admitting: Cardiovascular Disease

## 2018-09-04 NOTE — Progress Notes (Signed)
Cardiology Office Note  Date:  09/06/2018   ID:  Robert Burch, DOB 1933/12/12, MRN [id number]  PCP:  [name], MD   Chief Complaint  Patient presents with  . other    12 month follow up.  "doing well."     HPI:  84 yo with history of  paroxysmal atrial fibrillation,  Atypical chest pain,  catheterization in 2007 showing nonobstructive CAD,  Previous CT coronary calcium score in 2010 score of 68, predominately in the circumflex vessel Non smoker Presenting for routine followup of his atrial fibrillation and chest pain symptoms.  Chest pain, seen in the ER Blood pressure was elevated,  Some dizziness, Bradycardia, rate 48 bpm at home  Still exercising "less lung power"  previous pain in his back, running down his legs, chronic right knee pain  Reports he is taking Diovan 20 mg daily Cut back on his own   walks 2x daily for exercise  Lab work reviewed with him in detail HBA1C 5.8 Total chol 152, LDL 94 (in 2017)  EKG personally reviewed by myself on todays visit Shows sinus Brady 52 bpm no significant ST or T wave changes  Other past medical history reviewed 06/2009, Results reviewed with him Coronary Calcium Score: 68.2 (61.7 CFX, 6.5 LAD).  Low absolute coronary calcium score.    32nd percentile which is low risk overall.  Event monitor in April 2017 showing PVCs Stress test July 2016 showing no ischemia Echocardiogram July 2016 showing normal LV function  Takes 400 mg magnesium daily, PVC resolved Had chest pain this AM, less than one minute  He has diltiazem for breakthrough atrial fibrillation  Total chol 160, LDL 106 in 05/2015  Other past medical history son went through valve surgery in 2015 , this made him nervous about his own health Last stress test May 2015 showing no ischemia  Previously had significant GI workup for discomfort, most of which has been negative. This included an EGD, abdominal ultrasound. He does take  Tylenol and gabapentin.  chronic issue with sweats that seem to come on, unprovoked. Etiology is uncertain. It is not clear how much Xanax he is taking though he does report having a long history of anxiety.  Old lab work, cholesterol 162 LDL 100, HDL 40   PMH:   has a past medical history of Atypical chest pain, Cancer ([place]), Chronic anticoagulation, Chronic anxiety, Diverticulosis, GERD (gastroesophageal reflux disease), Hypertension, IBS (irritable bowel syndrome), Nonischemic cardiomyopathy ([place]), Paralyzed vocal cords, Paroxysmal atrial fibrillation ([place]), and Proteinuria.  PSH:    Past Surgical History:  Procedure Laterality Date  . CARDIAC CATHETERIZATION    . cataract surgery Bilateral   . MASS BIOPSY  2013    Current Outpatient Medications  Medication Sig Dispense Refill  . acidophilus (RISAQUAD) CAPS capsule Take 1 capsule by mouth daily.    . Cholecalciferol (VITAMIN D3) 400 UNITS CAPS Take 800 Units by mouth daily.     . Cobalamine Combinations (VITAMIN B12-FOLIC ACID) 962-836 MCG TABS Take 1 tablet by mouth daily.    . Coenzyme Q10 100 MG capsule Take 1 capsule by mouth 2 (two) times daily.     . Cranberry 500 MG CAPS Take 500 mg by mouth daily.    . diclofenac sodium (VOLTAREN) 1 % GEL Apply 2-4 g topically every 6 (six) hours as needed.     . finasteride (PROSCAR) 5 MG tablet Take 1 tablet (5 mg total) by mouth daily. 30 tablet 11  . Flaxseed, Linseed, (FLAX  SEED OIL) 1000 MG CAPS Take 1 capsule by mouth daily.      . flecainide (TAMBOCOR) 50 MG tablet TAKE 1 & 1/2 TABLETS BY MOUTH TWICE DAILY 270 tablet 3  . gabapentin (NEURONTIN) 100 MG capsule Take 200 mg by mouth at bedtime.    . hydrocortisone (ANUSOL-HC) 25 MG suppository Place 25 mg rectally daily as needed for hemorrhoids or anal itching.    . magnesium oxide (MAG-OX) 400 (241.3 Mg) MG tablet TAKE 1 TABLET BY MOUTH TWICE A DAY 180 tablet 3  . Multiple Vitamin (MULTIVITAMIN) tablet Take 1 tablet by mouth daily.       . nitroGLYCERIN (NITROSTAT) 0.4 MG SL tablet PLACE 1 TABLET (0.4 MG TOTAL) UNDER THE TONGUE EVERY 5 (FIVE) MINUTES AS NEEDED FOR CHEST PAIN. 25 tablet 1  . Omega-3 Fatty Acids (FISH OIL) 1000 MG CAPS Take 1 capsule by mouth daily.      . pantoprazole (PROTONIX) 40 MG tablet Take 40 mg by mouth daily.     . polyethylene glycol (MIRALAX / GLYCOLAX) packet Take 17 g by mouth daily as needed for mild constipation or moderate constipation.    . ranitidine (ZANTAC) 150 MG tablet Take 150 mg by mouth daily.    . saw palmetto 160 MG capsule Take 160 mg by mouth 2 (two) times daily.    . sucralfate (CARAFATE) 1 g tablet Take 1 g 2 (two) times daily by mouth.  3  . Tamsulosin HCl (FLOMAX) 0.4 MG CAPS Take 0.4 mg by mouth 2 (two) times daily.     . traMADol (ULTRAM) 50 MG tablet Take 50 mg by mouth every 6 (six) hours as needed for moderate pain.    . valsartan (DIOVAN) 40 MG tablet Take 20 mg by mouth 2 (two) times daily.     Marland Kitchen warfarin (COUMADIN) 2.5 MG tablet Take 2.5 mg by mouth every evening.      No current facility-administered medications for this visit.      Allergies:   Lanoxin [digoxin]; Lopressor [metoprolol tartrate]; Sulfonamide derivatives; and Verapamil   Social History:  The patient  reports that he has never smoked. He has never used smokeless tobacco. He reports that he does not drink alcohol or use drugs.   Family History:   family history is not on file.    Review of Systems: Review of Systems  Constitutional: Negative.   Respiratory: Negative.   Cardiovascular: Negative.   Gastrointestinal: Negative.   Musculoskeletal: Positive for joint pain.       Leg pain  Neurological: Negative.   Psychiatric/Behavioral: Negative.   All other systems reviewed and are negative.    PHYSICAL EXAM: VS:  BP (!) 150/88 (BP Location: Left Arm, Patient Position: Sitting, Cuff Size: Normal)   Pulse (!) 52   Ht 5\' 10"  (1.778 m)   Wt 184 lb 12 oz (83.8 kg)   BMI 26.51 kg/m  , BMI  Body mass index is 26.51 kg/m.  Constitutional:  oriented to person, place, and time. No distress.  HENT:  Head: Normocephalic and atraumatic.  Eyes:  no discharge. No scleral icterus.  Neck: Normal range of motion. Neck supple. No JVD present.  Cardiovascular: Normal rate, regular rhythm, normal heart sounds and intact distal pulses. Exam reveals no gallop and no friction rub. No edema No murmur heard. Pulmonary/Chest: Effort normal and breath sounds normal. No stridor. No respiratory distress.  no wheezes.  no rales.  no tenderness.  Abdominal: Soft.  no distension.  no  tenderness.  Musculoskeletal: Normal range of motion.  no  tenderness or deformity.  Neurological:  normal muscle tone. Coordination normal. No atrophy Skin: Skin is warm and dry. No rash noted. not diaphoretic.  Psychiatric:  normal mood and affect. behavior is normal. Thought content normal.    Recent Labs: 08/08/2018: BUN 13; Creatinine, Ser 0.92; Hemoglobin 14.0; Platelets 266; Potassium 4.7; Sodium 132    Lipid Panel No results found for: CHOL, HDL, LDLCALC, TRIG    Wt Readings from Last 3 Encounters:  09/06/18 184 lb 12 oz (83.8 kg)  08/26/18 187 lb (84.8 kg)  08/08/18 194 lb (88 kg)      ASSESSMENT AND PLAN:  Paroxysmal atrial fibrillation (HCC) -  Maintaining normal sinus rhythm, no symptoms concerning for arrhythmia Decrease flecainide 50 BID, Some dizziness  Essential hypertension  No changes made to the medications. Extra diovan as needed for high blood pressure  Chest heaviness - Long history of previous atypical symptoms, denies having symptoms recently No further testing needed  Gastroesophageal reflux disease without esophagitis Symptoms better on PPI, Carafate, H2 blocker Better  Anxiety Recommended he continue his regular exercise program,  Walks 2x a day Stable   Total encounter time more than 25 minutes  Greater than 50% was spent in counseling and coordination of care with  the patient  Disposition:   F/U  12 months    Orders Placed This Encounter  Procedures  . EKG 12-Lead     Signed, Esmond Plants, M.D., Ph.D. 09/06/2018  [place], [place]

## 2018-09-06 ENCOUNTER — Ambulatory Visit: Payer: Medicare Other | Admitting: Cardiovascular Disease

## 2018-09-06 ENCOUNTER — Encounter

## 2018-09-06 ENCOUNTER — Encounter: Payer: Self-pay | Admitting: Cardiovascular Disease

## 2018-09-06 VITALS — BP 150/88 | HR 52 | Ht 70.0 in | Wt 184.8 lb

## 2018-09-06 DIAGNOSIS — I10 Essential (primary) hypertension: Secondary | ICD-10-CM

## 2018-09-06 DIAGNOSIS — R55 Syncope and collapse: Secondary | ICD-10-CM

## 2018-09-06 DIAGNOSIS — I2510 Atherosclerotic heart disease of native coronary artery without angina pectoris: Secondary | ICD-10-CM

## 2018-09-06 DIAGNOSIS — R0789 Other chest pain: Secondary | ICD-10-CM

## 2018-09-06 DIAGNOSIS — I480 Paroxysmal atrial fibrillation: Secondary | ICD-10-CM | POA: Diagnosis not present

## 2018-09-06 MED ORDER — FLECAINIDE ACETATE 50 MG PO TABS: 50.0000 mg | ORAL_TABLET | Freq: Two times a day (BID) | ORAL | 3 refills | Status: DC

## 2018-09-06 NOTE — Patient Instructions (Addendum)
Drink more fluids  Medication Instructions:   Please decrease the flecainide down to 50 mg twice a day  Call the office if heart rate does not improved  Ideally would like HR >55  If blood pressure runs high, Take extra diovan 20 mg pill  Labwork:  No new labs needed  Testing/Procedures:  No further testing at this time   Follow-Up: It was a pleasure seeing you in the office today. Please call us if you have new issues that need to be addressed before your next appt.  [phone]  Your physician wants you to follow-up in: 12 months.  You will receive a reminder letter in the mail two months in advance. If you don't receive a letter, please call our office to schedule the follow-up appointment.  If you need a refill on your cardiac medications before your next appointment, please call your pharmacy.  For educational health videos Log in to : [email or web address] Or : [email or web address], password : [email or web address]

## 2018-09-22 ENCOUNTER — Telehealth: Payer: Self-pay | Admitting: Cardiovascular Disease

## 2018-09-22 NOTE — Telephone Encounter (Signed)
STAT if HR is under 50 or over 120 (normal HR is 60-100 beats per minute)  1) What is your heart rate? 50's mostly below 55   2.) Do you have a log of your heart rate readings (document readings)?  trends 50's  3.) Do you have any other symptoms? Lightheaded in morning   Patient concerned that changing flecainide may increase risk of going back into afib and what does he do if that happens please call to discuss

## 2018-09-22 NOTE — Telephone Encounter (Signed)
Spoke with patient and he states that he had been getting heart rates in the high 40's and became lightheaded. As low as 47 to 51-52 with some lightheadedness. He states that the lowest was 47.  He mentioned that he did reduce his flecainide down to 50 mg twice a day and wondered if it would need to be reduced. Advised that I would route this to provider for his review. He verbalized understanding of our conversation, agreement with plan, and had no further questions.

## 2018-09-23 ENCOUNTER — Ambulatory Visit: Payer: Medicare Other | Admitting: Cardiovascular Disease

## 2018-09-23 ENCOUNTER — Encounter: Payer: Self-pay | Admitting: Cardiovascular Disease

## 2018-09-23 VITALS — BP 150/84 | HR 57 | Ht 69.0 in | Wt 182.8 lb

## 2018-09-23 DIAGNOSIS — F419 Anxiety disorder, unspecified: Secondary | ICD-10-CM | POA: Diagnosis not present

## 2018-09-23 DIAGNOSIS — R0789 Other chest pain: Secondary | ICD-10-CM

## 2018-09-23 DIAGNOSIS — I480 Paroxysmal atrial fibrillation: Secondary | ICD-10-CM | POA: Diagnosis not present

## 2018-09-23 DIAGNOSIS — I2510 Atherosclerotic heart disease of native coronary artery without angina pectoris: Secondary | ICD-10-CM | POA: Diagnosis not present

## 2018-09-23 DIAGNOSIS — I10 Essential (primary) hypertension: Secondary | ICD-10-CM

## 2018-09-23 DIAGNOSIS — R001 Bradycardia, unspecified: Secondary | ICD-10-CM | POA: Insufficient documentation

## 2018-09-23 NOTE — Progress Notes (Signed)
Cardiology Office Note  Date:  09/23/2018   ID:  Robert Burch, DOB 15-Dec-1933, MRN [id number]  PCP:  [name], MD   Chief Complaint  Patient presents with  . other    Patient c/o decreased heart rate and dizziness. Meds reviewed by the pt. verbally.     HPI:  84 yo with history of  paroxysmal atrial fibrillation,  Anxiety, history of sweats Atypical chest pain,  catheterization in 2007 showing nonobstructive CAD,  Previous CT coronary calcium score in 2010 score of 68, predominately in the circumflex vessel Non smoker  PVCs, on magnesium Presenting for routine followup of his atrial fibrillation, orthostasis , chest pain symptoms.  On last visit, he was bradycardic, flecainide decreased, feels better Still with rates in the high 40, commonly in the 50s sometimes 60  Seen in the emergency room August 2019 for atypical chest pain Hospital records reviewed with the patient in detail  On magnesium 1 a day Was told to be on two a day for ectopy Thinks the ectopy might do better on twice a day dosing  Chronic dizziness,  Does not drink fluids Flomax twice a day  Orthostatic today in the office Lying: 182/87 heart rate 58 Sitting 167/82 rate 57 Standing 149/75 rate 58 Standing 3 minutes 136 over to 74 rate 58  Denies any significant chest pain on exertion Previously seen in the emergency room for chest pain, Blood pressure was elevated,  previous pain in his back, running down his legs, chronic right knee pain  Previously cut back on Diovan on his own   walks 2x daily for exercise  Lab work reviewed with him in detail HBA1C 5.8 Total chol 152, LDL 94 (in 2017)  EKG personally reviewed by myself on todays visit Shows sinus bradycardia rate 57 bpm  no significant ST or T wave changes  Other past medical history reviewed 06/2009, Results reviewed with him Coronary Calcium Score: 68.2 (61.7 CFX, 6.5 LAD).  Low absolute coronary calcium score.    32nd percentile which is low risk overall.  Event monitor in April 2017 showing PVCs Stress test July 2016 showing no ischemia Echocardiogram July 2016 showing normal LV function  He has diltiazem for breakthrough atrial fibrillation  Total chol 160, LDL 106 in 05/2015  son went through valve surgery in 2015 , this made him nervous about his own health Last stress test May 2015 showing no ischemia  Previously had significant GI workup for discomfort, most of which has been negative. This included an EGD, abdominal ultrasound. He does take Tylenol and gabapentin.   PMH:   has a past medical history of Atypical chest pain, Cancer ([place]), Chronic anticoagulation, Chronic anxiety, Diverticulosis, GERD (gastroesophageal reflux disease), Hypertension, IBS (irritable bowel syndrome), Nonischemic cardiomyopathy ([place]), Paralyzed vocal cords, Paroxysmal atrial fibrillation ([place]), and Proteinuria.  PSH:    Past Surgical History:  Procedure Laterality Date  . CARDIAC CATHETERIZATION    . cataract surgery Bilateral   . MASS BIOPSY  2013    Current Outpatient Medications  Medication Sig Dispense Refill  . acidophilus (RISAQUAD) CAPS capsule Take 1 capsule by mouth daily.    . Cholecalciferol (VITAMIN D3) 400 UNITS CAPS Take 800 Units by mouth daily.     . Cobalamine Combinations (VITAMIN B12-FOLIC ACID) 956-387 MCG TABS Take 1 tablet by mouth daily.    . Coenzyme Q10 100 MG capsule Take 1 capsule by mouth 2 (two) times daily.     . Cranberry 500 MG CAPS Take  500 mg by mouth daily.    . diclofenac sodium (VOLTAREN) 1 % GEL Apply 2-4 g topically every 6 (six) hours as needed.     . finasteride (PROSCAR) 5 MG tablet Take 1 tablet (5 mg total) by mouth daily. 30 tablet 11  . Flaxseed, Linseed, (FLAX SEED OIL) 1000 MG CAPS Take 1 capsule by mouth daily.      . flecainide (TAMBOCOR) 50 MG tablet Take 1 tablet (50 mg total) by mouth 2 (two) times daily. 180 tablet 3  . gabapentin (NEURONTIN)  100 MG capsule Take 200 mg by mouth at bedtime.    . hydrocortisone (ANUSOL-HC) 25 MG suppository Place 25 mg rectally daily as needed for hemorrhoids or anal itching.    . magnesium oxide (MAG-OX) 400 (241.3 Mg) MG tablet TAKE 1 TABLET BY MOUTH TWICE A DAY (Patient taking differently: Take 400 mg by mouth daily. ) 180 tablet 3  . Multiple Vitamin (MULTIVITAMIN) tablet Take 1 tablet by mouth daily.      . nitroGLYCERIN (NITROSTAT) 0.4 MG SL tablet PLACE 1 TABLET (0.4 MG TOTAL) UNDER THE TONGUE EVERY 5 (FIVE) MINUTES AS NEEDED FOR CHEST PAIN. 25 tablet 1  . Omega-3 Fatty Acids (FISH OIL) 1000 MG CAPS Take 1 capsule by mouth daily.      . pantoprazole (PROTONIX) 40 MG tablet Take 40 mg by mouth daily.     . polyethylene glycol (MIRALAX / GLYCOLAX) packet Take 17 g by mouth daily as needed for mild constipation or moderate constipation.    . ranitidine (ZANTAC) 150 MG tablet Take 150 mg by mouth daily.    . saw palmetto 160 MG capsule Take 160 mg by mouth 2 (two) times daily.    . sucralfate (CARAFATE) 1 g tablet Take 1 g 2 (two) times daily by mouth.  3  . Tamsulosin HCl (FLOMAX) 0.4 MG CAPS Take 0.4 mg by mouth 2 (two) times daily.     . traMADol (ULTRAM) 50 MG tablet Take 50 mg by mouth every 6 (six) hours as needed for moderate pain.    . valsartan (DIOVAN) 40 MG tablet Take 0.5 tablets (20 mg total) by mouth daily. 90 tablet 3  . warfarin (COUMADIN) 2.5 MG tablet Take 2.5 mg by mouth every evening.      No current facility-administered medications for this visit.      Allergies:   Lanoxin [digoxin]; Lopressor [metoprolol tartrate]; Sulfonamide derivatives; and Verapamil   Social History:  The patient  reports that he has never smoked. He has never used smokeless tobacco. He reports that he does not drink alcohol or use drugs.   Family History:   family history is not on file.    Review of Systems: Review of Systems  Constitutional: Negative.   Respiratory: Negative.   Cardiovascular:  Negative.   Gastrointestinal: Negative.   Musculoskeletal: Positive for joint pain.       Leg pain  Neurological: Negative.   Psychiatric/Behavioral: Negative.   All other systems reviewed and are negative.    PHYSICAL EXAM: VS:  BP (!) 150/84 (BP Location: Left Arm, Patient Position: Sitting, Cuff Size: Normal)   Pulse (!) 57   Ht 5\' 9"  (1.753 m)   Wt 182 lb 12 oz (82.9 kg)   BMI 26.99 kg/m  , BMI Body mass index is 26.99 kg/m.  No significant change compared to prior office visit GEN: Well nourished, well developed, in no acute distress  HEENT: normal  Neck: no JVD, carotid bruits,  or masses Cardiac: RRR; no murmurs, rubs, or gallops,no edema  Respiratory:  clear to auscultation bilaterally, normal work of breathing GI: soft, nontender, nondistended, + BS MS: no deformity or atrophy  Skin: warm and dry, no rash Neuro:  Strength and sensation are intact Psych: euthymic mood, full affect   Recent Labs: 08/08/2018: BUN 13; Creatinine, Ser 0.92; Hemoglobin 14.0; Platelets 266; Potassium 4.7; Sodium 132    Lipid Panel No results found for: CHOL, HDL, LDLCALC, TRIG    Wt Readings from Last 3 Encounters:  09/23/18 182 lb 12 oz (82.9 kg)  09/06/18 184 lb 12 oz (83.8 kg)  08/26/18 187 lb (84.8 kg)      ASSESSMENT AND PLAN:  Paroxysmal atrial fibrillation (HCC) -  Maintaining normal sinus rhythm, no symptoms concerning for arrhythmia Magnesium for PVCs Discussed strategies if he has paroxysmal atrial fibrillation, take extra flecainide  Essential hypertension  Orthostatic on today's visit Long discussion concerning etiology of orthostasis including dehydration, Also on 2 Flomax per day He will try to drink more fluids and if he continues to have orthostasis symptoms would consider decreasing Flomax down to 1 a day  Chest heaviness - Long history of previous atypical symptoms, denies having symptoms recently No further testing needed Discussed again on today's  visit, stable  Gastroesophageal reflux disease without esophagitis Symptoms better on PPI, Carafate, H2 blocker Better, discussed again today  Anxiety Recommended he continue his regular exercise program,  Walks 2x a day Likely contributing to obsessive behavior concerning blood pressure, chest discomfort symptoms   Total encounter time more than 25 minutes  Greater than 50% was spent in counseling and coordination of care with the patient  Disposition:   F/U  12 months    No orders of the defined types were placed in this encounter.    Signed, Esmond Plants, M.D., Ph.D. 09/23/2018  [place], [place]

## 2018-09-23 NOTE — Telephone Encounter (Signed)
Scheduled  10/4 [name] per Patient request

## 2018-09-23 NOTE — Patient Instructions (Addendum)
Drink for fluids  Double the magnesium - Take as ordered 400 mg by mouth two times a day.  If you continue to get dizzy spells despite fluids, Consider decreasing the flomax down to one a day  Medication Instructions:   No medication changes made  Labwork:  No new labs needed  Testing/Procedures:  No further testing at this time   Follow-Up: It was a pleasure seeing you in the office today. Please call us if you have new issues that need to be addressed before your next appt.  [phone]  Your physician wants you to follow-up in: 12 months.  You will receive a reminder letter in the mail two months in advance. If you don't receive a letter, please call our office to schedule the follow-up appointment.  If you need a refill on your cardiac medications before your next appointment, please call your pharmacy.  For educational health videos Log in to : [email or web address] Or : [email or web address], password : [email or web address]

## 2018-09-30 ENCOUNTER — Other Ambulatory Visit: Payer: Self-pay | Admitting: Cardiovascular Disease

## 2018-10-12 ENCOUNTER — Ambulatory Visit: Payer: Medicare Other | Admitting: Urology

## 2018-10-12 ENCOUNTER — Encounter: Payer: Self-pay | Admitting: Urology

## 2018-10-12 VITALS — BP 136/81 | HR 64 | Ht 70.0 in | Wt 186.5 lb

## 2018-10-12 DIAGNOSIS — N138 Other obstructive and reflux uropathy: Secondary | ICD-10-CM | POA: Diagnosis not present

## 2018-10-12 DIAGNOSIS — R102 Pelvic and perineal pain: Secondary | ICD-10-CM

## 2018-10-12 DIAGNOSIS — D649 Anemia, unspecified: Secondary | ICD-10-CM | POA: Insufficient documentation

## 2018-10-12 DIAGNOSIS — N401 Enlarged prostate with lower urinary tract symptoms: Secondary | ICD-10-CM | POA: Diagnosis not present

## 2018-10-12 LAB — BLADDER SCAN AMB NON-IMAGING: Scan Result: 0

## 2018-10-12 NOTE — Progress Notes (Signed)
10/12/2018 10:27 AM   Robert Burch [birth year] [id number]  Referring provider: [name], MD [phone], [place] [address]  CC: BPH/LUTS  HPI: Patient is an 84 year old Caucasian male who presents today for symptoms of prostatitis.  He was last seen in our office in 08/2018.  At that time, he was previously followed by Dr. Jacqlyn Larsen and needed to reestablish care with a local urologist.  His primary urinary symptoms are nocturia 3-4 times per night as well as a weak stream.  CT scan in May 2019 demonstrated a large 125 cc prostate.  Dr. Jacqlyn Larsen previously been monitoring his PSA, which was last checked in February 2019 and was 2.28.  He does drink coffee and tea throughout the day, including tea in the evenings.  He is taking Flomax.  He was started on finasteride and Dr. Diamantina Providence dicussed HoLEP with the patient.   Today, he is complaining of a perineal ache that radiates to the lower back.  He has baseline frequency, post void dribbling and nocturia.  Patient denies any gross hematuria, dysuria or suprapubic/flank pain.  Patient denies any fevers, chills, nausea or vomiting.     His PVR is 0 mL.  His UA is bland.    He has not had any issues with bowel movements such as constipation, diarrhea or fecal incontinence.     PMH: Past Medical History:  Diagnosis Date  . Atypical chest pain    a. Nonobstructive, very minor irregs by cath 2007;  b. 06/2009 Ex MV: small area of apical lateral ischemia;  c. 06/2009 Cath: essentially normal;  d. 04/2014 Lexiscan MV: EF 59%, no ischemia/infarct->low risk.  . Cancer ([place])   . Chronic anticoagulation    a. Chronic Coumadin  . Chronic anxiety   . Diverticulosis   . GERD (gastroesophageal reflux disease)   . Hypertension   . IBS (irritable bowel syndrome)   . Nonischemic cardiomyopathy ([place])    a. EF 45% by echo 2010;  b. 05/2012 Echo: EF 55-65%, no rwma, Gr 1 DD, mild AI/MR, mildly dil LA.  . Paralyzed vocal cords   . Paroxysmal  atrial fibrillation ([place])    a. Treated with flecainide  . Proteinuria    History of    Surgical History: Past Surgical History:  Procedure Laterality Date  . CARDIAC CATHETERIZATION    . cataract surgery Bilateral   . MASS BIOPSY  2013     Allergies:  Allergies  Allergen Reactions  . Lanoxin [Digoxin]   . Lopressor [Metoprolol Tartrate]   . Sulfonamide Derivatives     Rash   . Verapamil     Family History: Family History  Problem Relation Age of Onset  . Heart attack Neg Hx     Social History:  reports that he has never smoked. He has never used smokeless tobacco. He reports that he does not drink alcohol or use drugs.  ROS: Please see flowsheet from today's date for complete review of systems.  Physical Exam: BP 136/81 (BP Location: Left Arm, Patient Position: Sitting, Cuff Size: Normal)   Pulse 64   Ht 5\' 10"  (1.778 m)   Wt 186 lb 8 oz (84.6 kg)   BMI 26.76 kg/m   Constitutional: Well nourished. Alert and oriented, No acute distress. HEENT:  AT, moist mucus membranes. Trachea midline, no masses. Cardiovascular: No clubbing, cyanosis, or edema. Respiratory: Normal respiratory effort, no increased work of breathing. GI: Abdomen is soft, non tender, non distended, no abdominal  masses. Liver and spleen not palpable.  No hernias appreciated.  Stool sample for occult testing is not indicated.   GU: No CVA tenderness.  No bladder fullness or masses.  Patient with circumcised phallus.  Urethral meatus is patent.  No penile discharge. No penile lesions or rashes. Scrotum without lesions, cysts, rashes and/or edema.  Testicles are located scrotally bilaterally. No masses are appreciated in the testicles. Left and right epididymis are normal. Rectal: Patient with  normal sphincter tone. Anus and perineum without scarring or rashes. No rectal masses are appreciated. Prostate is massive, could only palpate the apex, no nodules are appreciated.  Skin: No rashes, bruises or  suspicious lesions. Lymph: No cervical or inguinal adenopathy. Neurologic: Grossly intact, no focal deficits, moving all 4 extremities. Psychiatric: Normal mood and affect.  Laboratory Data: PSA 01/2018: 2.28  Urinalysis See Epic.  I have reviewed the labs.   Pertinent Imaging: CT A/P w/contrast 05/10/2018: Prostate volume 125cc  Assessment & Plan:    1. Massive BPH with LUTS Continue conservative management, avoiding bladder irritants and timed voiding's Continue tamsulosin 0.4 mg daily and finasteride 5 mg daily RTC as scheduled with Dr. Diamantina Providence  2. Perineal pain/LBP Urine sent for culture Was unable to palpate entire prostate gland, so unsure if this is prostatitis We will hold on prescribing antibiotic at this time until culture results are available as he has multiple intolerances/allergies to antibiotics and is on Coumadin Patient will continue conservative measures with Tylenol and heating pad Patient is advised that if they should start to experience pain that is not able to be controlled with pain medication, intractable nausea and/or vomiting and/or fevers greater than 103 or shaking chills to contact the office immediately or seek treatment in the emergency department for emergent intervention.      Zara Council, PA-C  [facility] [address], [place], [place] [address] [phone]

## 2018-10-13 LAB — MICROSCOPIC EXAMINATION
Bacteria, UA: NONE SEEN
Epithelial Cells (non renal): NONE SEEN /hpf (ref 0–10)

## 2018-10-13 LAB — URINALYSIS, COMPLETE
Bilirubin, UA: NEGATIVE
GLUCOSE, UA: NEGATIVE
Ketones, UA: NEGATIVE
LEUKOCYTES UA: NEGATIVE
Nitrite, UA: NEGATIVE
PROTEIN UA: NEGATIVE
Specific Gravity, UA: 1.015 (ref 1.005–1.030)
UUROB: 0.2 mg/dL (ref 0.2–1.0)
pH, UA: 7.5 (ref 5.0–7.5)

## 2018-11-08 ENCOUNTER — Ambulatory Visit: Payer: Medicare Other | Admitting: Cardiovascular Disease

## 2018-11-21 ENCOUNTER — Other Ambulatory Visit: Payer: Self-pay | Admitting: Cardiovascular Disease

## 2018-12-29 ENCOUNTER — Telehealth: Payer: Self-pay | Admitting: Cardiovascular Disease

## 2018-12-29 NOTE — Telephone Encounter (Signed)
Called patient. Been having chest pain over the past 2 days that comes and goes.  Denies jaw or left arm pain, nausea, vomiting, headache, blurred vision or dizziness. States he routinely gets sweaty especially in the winter months which has been happening for a few years now and they have not figured out why yet.  Denies the feeling of something heavy sitting on his chest. States he still goes out to walk a mile or 2 and it does not get any worse while doing so.  His GI doctor has been trying to wean him off some ant-acids so he's not sure if that is playing a role as well.  Blood pressure/HR: 12/28/2018 124/75,  63   131/81,  58 12/29/2018 124/74,  65  States the only strenuous activity besides walking was moving his trash cans to the road and lifting a microwave which he did not feel was anything more than usual. Patient is scheduled to see Dr Rockey Situ tomorrow. Pt verbalized understanding to call 911 or go to the emergency room, if he develops any new or worsening symptoms.

## 2018-12-29 NOTE — Telephone Encounter (Signed)
Pt c/o of Chest Pain: STAT if CP now or developed within 24 hours  1. Are you having CP right now? Light aching right now 3/10 pain   2. Are you experiencing any other symptoms (ex. SOB, nausea, vomiting, sweating)? Sweating chronic   3. How long have you been experiencing CP? 2 days   4. Is your CP continuous or coming and going? Interim   5. Have you taken Nitroglycerin? No  ?

## 2018-12-29 NOTE — Progress Notes (Signed)
Cardiology Office Note  Date:  12/30/2018   ID:  Robert Burch, DOB 1933/05/04, MRN [id number]  PCP:  [name], MD   Chief Complaint  Patient presents with  . Other    Pt. c/o chest pain/discomfort that start last weekend.  Meds reviewed by the pt. verbally.     HPI:  85 yo with history of  Anxiety concerning his health History of sweats paroxysmal atrial fibrillation,  Atypical chest pain,  catheterization in 2007 showing nonobstructive CAD,  Previous CT coronary calcium score in 2010 score of 68, predominately in the circumflex vessel Non smoker  PVCs, on magnesium Presenting for routine followup of his atrial fibrillation, orthostasis , chest pain symptoms.  Atypical chest pain, left chest Lasted one week,. Constant, Not associated with exertion, Used a hot pack  Again complains about periodic sweats in his creases of his arms, armpits happens more in the winter.  Etiology unclear  Denies any significant tachycardia concerning for atrial fibrillation  On prior office visit had bradycardia, flecainide dose was decreased down to 50 twice daily Feels better on the lower dose, no significant dizziness  Seen in the emergency room August 2019 for atypical chest pain  Denies any significant chest pain on exertion Previously seen in the emergency room for chest pain, Blood pressure was elevated,  previous pain in his back, running down his legs, chronic right knee pain  EKG personally reviewed by myself on todays visit Shows sinus bradycardia rate 56 bpm  no significant ST or T wave changes  Other past medical history reviewed 06/2009, Results reviewed with him Coronary Calcium Score: 68.2 (61.7 CFX, 6.5 LAD).  Low absolute coronary calcium score.    32nd percentile which is low risk overall.  Event monitor in April 2017 showing PVCs Stress test July 2016 showing no ischemia Echocardiogram July 2016 showing normal LV function  He has diltiazem for  breakthrough atrial fibrillation  Total chol 160, LDL 106 in 05/2015  son went through valve surgery in 2015 , this made him nervous about his own health Last stress test May 2015 showing no ischemia  Previously had significant GI workup for discomfort, most of which has been negative. This included an EGD, abdominal ultrasound. He does take Tylenol and gabapentin.   PMH:   has a past medical history of Atypical chest pain, Cancer ([place]), Chronic anticoagulation, Chronic anxiety, Diverticulosis, GERD (gastroesophageal reflux disease), Hypertension, IBS (irritable bowel syndrome), Nonischemic cardiomyopathy ([place]), Paralyzed vocal cords, Paroxysmal atrial fibrillation ([place]), and Proteinuria.  PSH:    Past Surgical History:  Procedure Laterality Date  . CARDIAC CATHETERIZATION    . cataract surgery Bilateral   . MASS BIOPSY  2013    Current Outpatient Medications  Medication Sig Dispense Refill  . acidophilus (RISAQUAD) CAPS capsule Take 1 capsule by mouth daily.    . Cholecalciferol (VITAMIN D3) 400 UNITS CAPS Take 800 Units by mouth daily.     . Cobalamine Combinations (VITAMIN B12-FOLIC ACID) 601-093 MCG TABS Take 1 tablet by mouth daily.    . Coenzyme Q10 100 MG capsule Take 1 capsule by mouth 2 (two) times daily.     . Cranberry 500 MG CAPS Take 500 mg by mouth daily.    . diclofenac sodium (VOLTAREN) 1 % GEL Apply 2-4 g topically every 6 (six) hours as needed.     . finasteride (PROSCAR) 5 MG tablet Take 1 tablet (5 mg total) by mouth daily. 30 tablet 11  . Flaxseed, Linseed, (FLAX SEED  OIL) 1000 MG CAPS Take 1 capsule by mouth daily.      . flecainide (TAMBOCOR) 50 MG tablet Take 1 tablet (50 mg total) by mouth 2 (two) times daily. 180 tablet 3  . gabapentin (NEURONTIN) 100 MG capsule Take 200 mg by mouth at bedtime.    . hydrocortisone (ANUSOL-HC) 25 MG suppository Place 25 mg rectally daily as needed for hemorrhoids or anal itching.    . magnesium oxide (MAG-OX) 400 MG  tablet TAKE 1 TABLET BY MOUTH TWICE A DAY 180 tablet 3  . Multiple Vitamin (MULTIVITAMIN) tablet Take 1 tablet by mouth daily.      . nitroGLYCERIN (NITROSTAT) 0.4 MG SL tablet PLACE 1 TABLET (0.4 MG TOTAL) UNDER THE TONGUE EVERY 5 (FIVE) MINUTES AS NEEDED FOR CHEST PAIN. 90 tablet 0  . Omega-3 Fatty Acids (FISH OIL) 1000 MG CAPS Take 1 capsule by mouth daily.      . pantoprazole (PROTONIX) 40 MG tablet Take 40 mg by mouth daily.     . polyethylene glycol (MIRALAX / GLYCOLAX) packet Take 17 g by mouth daily as needed for mild constipation or moderate constipation.    . ranitidine (ZANTAC) 150 MG tablet Take 150 mg by mouth daily.    . saw palmetto 160 MG capsule Take 160 mg by mouth 2 (two) times daily.    . sucralfate (CARAFATE) 1 g tablet Take 1 g 2 (two) times daily by mouth.  3  . Tamsulosin HCl (FLOMAX) 0.4 MG CAPS Take 0.4 mg by mouth 2 (two) times daily.     . traMADol (ULTRAM) 50 MG tablet Take 50 mg by mouth every 6 (six) hours as needed for moderate pain.    . valsartan (DIOVAN) 40 MG tablet Take 0.5 tablets (20 mg total) by mouth daily. 90 tablet 3  . warfarin (COUMADIN) 2.5 MG tablet Take 2.5 mg by mouth every evening.      No current facility-administered medications for this visit.      Allergies:   Ciprofloxacin; Lanoxin [digoxin]; Lopressor [metoprolol tartrate]; Sulfonamide derivatives; and Verapamil   Social History:  The patient  reports that he has never smoked. He has never used smokeless tobacco. He reports that he does not drink alcohol or use drugs.   Family History:   family history is not on file.    Review of Systems: Review of Systems  Constitutional: Negative.        Sweats  Respiratory: Negative.   Cardiovascular: Positive for chest pain.  Gastrointestinal: Negative.   Musculoskeletal: Positive for joint pain.       Leg pain  Neurological: Negative.   Psychiatric/Behavioral: Negative.   All other systems reviewed and are negative.    PHYSICAL  EXAM: VS:  BP 120/72 (BP Location: Left Arm, Patient Position: Sitting, Cuff Size: Normal)   Pulse (!) 56   Ht 5\' 9"  (1.753 m)   Wt 191 lb 8 oz (86.9 kg)   BMI 28.28 kg/m  , BMI Body mass index is 28.28 kg/m.  Constitutional:  oriented to person, place, and time. No distress.  HENT:  Head: Grossly normal Eyes:  no discharge. No scleral icterus.  Neck: No JVD, no carotid bruits  Cardiovascular: Regular rate and rhythm, no murmurs appreciated Pulmonary/Chest: Clear to auscultation bilaterally, no wheezes or rails Abdominal: Soft.  no distension.  no tenderness.  Musculoskeletal: Normal range of motion Neurological:  normal muscle tone. Coordination normal. No atrophy Skin: Skin warm and dry Psychiatric: normal affect, pleasant  Recent  Labs: 08/08/2018: BUN 13; Creatinine, Ser 0.92; Hemoglobin 14.0; Platelets 266; Potassium 4.7; Sodium 132    Lipid Panel No results found for: CHOL, HDL, LDLCALC, TRIG    Wt Readings from Last 3 Encounters:  12/30/18 191 lb 8 oz (86.9 kg)  10/12/18 186 lb 8 oz (84.6 kg)  09/23/18 182 lb 12 oz (82.9 kg)     ASSESSMENT AND PLAN:  Paroxysmal atrial fibrillation ([facility]) -  Maintaining normal sinus rhythm, tolerating flecainide 50 twice daily On warfarin  Essential hypertension  Denies orthostasis symptoms No near syncope or syncope No medication changes made  10 pound weight gain  Chest heaviness - Long history of previous atypical symptoms, Previous stress test early 2019.  Pain reproducible on palpation  Gastroesophageal reflux disease without esophagitis Symptoms better on PPI, Carafate, H2 blocker He has recently stopped several of the medications under guidance by primary care  Anxiety Recommended he continue his regular exercise program,  Walks 2x a day Continues to worry about his overall health, rate shortness provided  Hyperlipidemia Recommend lipid panel through primary care    Total encounter time more than 25 minutes   Greater than 50% was spent in counseling and coordination of care with the patient  Disposition:   F/U  12 months    Orders Placed This Encounter  Procedures  . EKG 12-Lead     Signed, Esmond Plants, M.D., Ph.D. 12/30/2018  [place], [place]

## 2018-12-30 ENCOUNTER — Ambulatory Visit: Payer: Medicare Other | Admitting: Cardiovascular Disease

## 2018-12-30 ENCOUNTER — Encounter: Payer: Self-pay | Admitting: Cardiovascular Disease

## 2018-12-30 VITALS — BP 120/72 | HR 56 | Ht 69.0 in | Wt 191.5 lb

## 2018-12-30 DIAGNOSIS — I480 Paroxysmal atrial fibrillation: Secondary | ICD-10-CM | POA: Diagnosis not present

## 2018-12-30 DIAGNOSIS — R55 Syncope and collapse: Secondary | ICD-10-CM

## 2018-12-30 DIAGNOSIS — R001 Bradycardia, unspecified: Secondary | ICD-10-CM

## 2018-12-30 DIAGNOSIS — I10 Essential (primary) hypertension: Secondary | ICD-10-CM

## 2018-12-30 DIAGNOSIS — F419 Anxiety disorder, unspecified: Secondary | ICD-10-CM

## 2018-12-30 DIAGNOSIS — I2510 Atherosclerotic heart disease of native coronary artery without angina pectoris: Secondary | ICD-10-CM

## 2018-12-30 DIAGNOSIS — R0789 Other chest pain: Secondary | ICD-10-CM

## 2018-12-30 NOTE — Patient Instructions (Signed)

## 2019-04-25 ENCOUNTER — Ambulatory Visit: Payer: Self-pay | Admitting: Urology

## 2019-04-28 ENCOUNTER — Telehealth: Payer: Self-pay

## 2019-04-28 NOTE — Telephone Encounter (Signed)
Virtual Visit Pre-Appointment Phone Call 1. Confirm consent - "In the setting of the current Covid19 crisis, you are scheduled for a (phone or video) visit with your provider on (date) at (time).  Just as we do with many in-office visits, in order for you to participate in this visit, we must obtain consent.  If you'd like, I can send this to your mychart (if signed up) or email for you to review.  Otherwise, I can obtain your verbal consent now.  All virtual visits are billed to your insurance company just like a normal visit would be.  By agreeing to a virtual visit, we'd like you to understand that the technology does not allow for your provider to perform an examination, and thus may limit your provider's ability to fully assess your condition. If your provider identifies any concerns that need to be evaluated in person, we will make arrangements to do so.  Finally, though the technology is pretty good, we cannot assure that it will always work on either your or our end, and in the setting of a video visit, we may have to convert it to a phone-only visit.  In either situation, we cannot ensure that we have a secure connection.  Are you willing to proceed?"YES  2. Advise patient to be prepared - "Two hours prior to your appointment, go ahead and check your blood pressure, pulse, oxygen saturation, and your weight (if you have the equipment to check those) and write them all down. When your visit starts, your provider will ask you for this information. If you have an Apple Watch or Kardia device, please plan to have heart rate information ready on the day of your appointment. Please have a pen and paper handy nearby the day of the visit as well." 3.  4. Inform patient they will receive a phone call 15 minutes prior to their appointment time (may be from unknown caller ID) so they should be prepared to answer    TELEPHONE CALL NOTE  Robert Burch has been deemed a candidate for a follow-up  tele-health visit to limit community exposure during the Covid-19 pandemic. I spoke with the patient via phone to ensure availability of phone/video source, confirm preferred email & phone number, and discuss instructions and expectations.  I reminded Robert Burch to be prepared with any vital sign and/or heart rhythm information that could potentially be obtained via home monitoring, at the time of his visit. I reminded Robert Burch to expect a phone call prior to his visit.  Alba Destine, RMA 04/28/2019 2:56 PM IF USING DOXIMITY or [email or web address] - The patient will receive a link just prior to their visit by text.     FULL LENGTH CONSENT FOR TELE-HEALTH VISIT   I hereby voluntarily request, consent and authorize [facility] and its employed or contracted physicians, physician assistants, nurse practitioners or other licensed health care professionals (the Practitioner), to provide me with telemedicine health care services (the "Services") as deemed necessary by the treating Practitioner. I acknowledge and consent to receive the Services by the Practitioner via telemedicine. I understand that the telemedicine visit will involve communicating with the Practitioner through live audiovisual communication technology and the disclosure of certain medical information by electronic transmission. I acknowledge that I have been given the opportunity to request an in-person assessment or other available alternative prior to the telemedicine visit and am voluntarily participating in the telemedicine visit.  I understand that I have the right  to withhold or withdraw my consent to the use of telemedicine in the course of my care at any time, without affecting my right to future care or treatment, and that the Practitioner or I may terminate the telemedicine visit at any time. I understand that I have the right to inspect all information obtained and/or recorded in the course of the telemedicine  visit and may receive copies of available information for a reasonable fee.  I understand that some of the potential risks of receiving the Services via telemedicine include:  Marland Kitchen Delay or interruption in medical evaluation due to technological equipment failure or disruption; . Information transmitted may not be sufficient (e.g. poor resolution of images) to allow for appropriate medical decision making by the Practitioner; and/or  . In rare instances, security protocols could fail, causing a breach of personal health information.  Furthermore, I acknowledge that it is my responsibility to provide information about my medical history, conditions and care that is complete and accurate to the best of my ability. I acknowledge that Practitioner's advice, recommendations, and/or decision may be based on factors not within their control, such as incomplete or inaccurate data provided by me or distortions of diagnostic images or specimens that may result from electronic transmissions. I understand that the practice of medicine is not an exact science and that Practitioner makes no warranties or guarantees regarding treatment outcomes. I acknowledge that I will receive a copy of this consent concurrently upon execution via email to the email address I last provided but may also request a printed copy by calling the office of [facility].    I understand that my insurance will be billed for this visit.   I have read or had this consent read to me. . I understand the contents of this consent, which adequately explains the benefits and risks of the Services being provided via telemedicine.  . I have been provided ample opportunity to ask questions regarding this consent and the Services and have had my questions answered to my satisfaction. . I give my informed consent for the services to be provided through the use of telemedicine in my medical care  By participating in this telemedicine visit I agree to the  above.

## 2019-05-09 ENCOUNTER — Other Ambulatory Visit: Payer: Self-pay | Admitting: Urology

## 2019-05-09 MED ORDER — TAMSULOSIN HCL 0.4 MG PO CAPS: 0.4000 mg | ORAL_CAPSULE | Freq: Two times a day (BID) | ORAL | 6 refills | Status: DC

## 2019-05-09 NOTE — Telephone Encounter (Signed)
Refill sent.

## 2019-05-09 NOTE — Telephone Encounter (Signed)
Pt needs refill for Tamsulosin .4mg . Please advise

## 2019-05-10 NOTE — Progress Notes (Signed)
Electrophysiology TeleHealth Note   Due to national recommendations of social distancing due to [place] 19, an audio/video telehealth visit is felt to be most appropriate for this patient at this time  The patient did not have access to video technology/had technical difficulties with video requiring transitioning to audio format only (telephone).  All issues noted in this document were discussed and addressed.  No physical exam could be performed with this format.    .  See MyChart message from today for the patient's consent to telehealth for [place].   Date:  05/11/2019   ID:  Robert Burch, DOB July 23, 1933, MRN [id number]  Location: patient's home  Provider location: [address] [place]  Evaluation Performed: Follow-up visit  PCP:  Robert Pitch, MD  Cardiologist:  TG   Electrophysiologist:  SK   Chief Complaint:  Dyspnea and afib   History of Present Illness:    Robert Burch is a 85 y.o. male who presents via audio/video conferencing for a telehealth visit today.  Since last being seen in our clinic for PVCs and Afib the patient reports no interval afib.  BP well controlled.  Some dizziness   DOE with onset of walking, improves the longer he walks--ture also with swimming.  Duration about 1 yr, but without progression.  Isolated episode of shoulder discomfort.    Noted HR was slow, < 50   DATE TEST    7/16    Echo   EF 55-60 % Mild LAE  7/16    myoview   EF 35 % No ischemia-gut artifact         DATE PR interval QRSduration Dose-BID  3/10  150 114 50  5/18 180 114 75  5/19 184 116 75  1/20  178 104 50   Date Cr K Hgb  5/19 1.0 5.2 13.1   8/19    14.0     The patient denies symptoms of fevers, chills, cough, or new SOB worrisome for COVID 19.    Past Medical History:  Diagnosis Date  . Atypical chest pain    a. Nonobstructive, very minor irregs by cath 2007;  b. 06/2009 Ex MV: small area of apical lateral  ischemia;  c. 06/2009 Cath: essentially normal;  d. 04/2014 Lexiscan MV: EF 59%, no ischemia/infarct->low risk.  . Cancer ([place])   . Chronic anticoagulation    a. Chronic Coumadin  . Chronic anxiety   . Diverticulosis   . GERD (gastroesophageal reflux disease)   . Hypertension   . IBS (irritable bowel syndrome)   . Nonischemic cardiomyopathy ([place])    a. EF 45% by echo 2010;  b. 05/2012 Echo: EF 55-65%, no rwma, Gr 1 DD, mild AI/MR, mildly dil LA.  . Paralyzed vocal cords   . Paroxysmal atrial fibrillation ([place])    a. Treated with flecainide  . Proteinuria    History of    Past Surgical History:  Procedure Laterality Date  . CARDIAC CATHETERIZATION    . cataract surgery Bilateral   . MASS BIOPSY  2013    Current Outpatient Medications  Medication Sig Dispense Refill  . acidophilus (RISAQUAD) CAPS capsule Take 1 capsule by mouth daily.    . Cholecalciferol (VITAMIN D3) 400 UNITS CAPS Take 800 Units by mouth daily.     . Cobalamine Combinations (VITAMIN B12-FOLIC ACID) 335-456 MCG TABS Take 1 tablet by mouth daily.    . Coenzyme Q10 100 MG capsule Take 1 capsule by mouth  2 (two) times daily.     . Cranberry 500 MG CAPS Take 500 mg by mouth daily.    . diclofenac sodium (VOLTAREN) 1 % GEL Apply 2-4 g topically every 6 (six) hours as needed.     . finasteride (PROSCAR) 5 MG tablet Take 1 tablet (5 mg total) by mouth daily. 30 tablet 11  . Flaxseed, Linseed, (FLAX SEED OIL) 1000 MG CAPS Take 1 capsule by mouth daily.      . flecainide (TAMBOCOR) 50 MG tablet Take 1 tablet (50 mg total) by mouth 2 (two) times daily. 180 tablet 3  . gabapentin (NEURONTIN) 100 MG capsule Take 200 mg by mouth at bedtime.    . hydrocortisone (ANUSOL-HC) 25 MG suppository Place 25 mg rectally daily as needed for hemorrhoids or anal itching.    . magnesium oxide (MAG-OX) 400 MG tablet TAKE 1 TABLET BY MOUTH TWICE A DAY 180 tablet 3  . Multiple Vitamin (MULTIVITAMIN) tablet Take 1 tablet by mouth daily.      .  nitroGLYCERIN (NITROSTAT) 0.4 MG SL tablet PLACE 1 TABLET (0.4 MG TOTAL) UNDER THE TONGUE EVERY 5 (FIVE) MINUTES AS NEEDED FOR CHEST PAIN. 90 tablet 0  . Omega-3 Fatty Acids (FISH OIL) 1000 MG CAPS Take 1 capsule by mouth daily.      . pantoprazole (PROTONIX) 40 MG tablet Take 40 mg by mouth daily.     . polyethylene glycol (MIRALAX / GLYCOLAX) packet Take 17 g by mouth daily as needed for mild constipation or moderate constipation.    . saw palmetto 160 MG capsule Take 160 mg by mouth 2 (two) times daily.    . sucralfate (CARAFATE) 1 g tablet Take 1 g 2 (two) times daily by mouth.  3  . tamsulosin (FLOMAX) 0.4 MG CAPS capsule Take 1 capsule (0.4 mg total) by mouth 2 (two) times daily. 60 capsule 6  . valsartan (DIOVAN) 40 MG tablet Take 0.5 tablets (20 mg total) by mouth daily. 90 tablet 3  . warfarin (COUMADIN) 2.5 MG tablet Take 2.5 mg by mouth every evening.     . traMADol (ULTRAM) 50 MG tablet Take 50 mg by mouth every 6 (six) hours as needed for moderate pain.     No current facility-administered medications for this visit.     Allergies:   Ciprofloxacin; Lanoxin [digoxin]; Lopressor [metoprolol tartrate]; Sulfonamide derivatives; and Verapamil   Social History:  The patient  reports that he has never smoked. He has never used smokeless tobacco. He reports that he does not drink alcohol or use drugs.   Family History:  The patient's   family history is not on file.   ROS:  Please see the history of present illness.   All other systems are personally reviewed and negative.    Exam:    Vital Signs:  BP 122/70 (BP Location: Left Arm, Patient Position: Sitting, Cuff Size: Normal)   Pulse (!) 59   Temp (!) 97.5 F (36.4 C) (Oral)   Ht 5\' 9"  (1.753 m)   Wt 190 lb (86.2 kg)   BMI 28.06 kg/m        Labs/Other Tests and Data Reviewed:    Recent Labs: 08/08/2018: BUN 13; Creatinine, Ser 0.92; Hemoglobin 14.0; Platelets 266; Potassium 4.7; Sodium 132   Wt Readings from Last 3  Encounters:  05/11/19 190 lb (86.2 kg)  12/30/18 191 lb 8 oz (86.9 kg)  10/12/18 186 lb 8 oz (84.6 kg)     Other studies personally  reviewed: Additional studies/ records that were reviewed today include.aa    ASSESSMENT & PLAN:    Atrial fibrillation-paroxysmal  1C therapy  PACs/PVCs  Sinus bradycardia  Diverticulitis  Continues 1c but without AV nodal blockade 2/2 bradycardia  On Anticoagulation;  No bleeding issues   No intercurrent atrial fibrillation or flutter  Dyspnea likely related to chronotropic incompetence; he desires to see if it is related to flecainide, I think not but will undertake 2 week exclusion trial.  If that is unrevealing, will try a ZIO patch to assess resting HR, 2-3 min in with dyspnea and then again @ 10 min   COVID 19 screen The patient denies symptoms of COVID 19 at this time.  The importance of social distancing was discussed today.  Follow-up:  2 week my chart/ call to review response to stopping flecainide     Current medicines are reviewed at length with the patient today.   The patient has concerns regarding his medicines.  The following changes were made today:   Hold flecainide for 2 weeks  Labs/ tests ordered today include:   No orders of the defined types were placed in this encounter.   Future tests ( post COVID )    in    months  Patient Risk:  after full review of this patients clinical status, I feel that they are at moderate  risk at this time.  Today, I have spent 22 minutes with the patient with telehealth technology discussing the above.  Signed, Virl Axe, MD  05/11/2019 10:01 AM     [facility] [address] [place] [address] [phone] (office) [phone] (fax)

## 2019-05-11 ENCOUNTER — Other Ambulatory Visit: Payer: Self-pay

## 2019-05-11 ENCOUNTER — Telehealth (INDEPENDENT_AMBULATORY_CARE_PROVIDER_SITE_OTHER): Payer: Medicare Other | Admitting: Internal Medicine

## 2019-05-11 VITALS — BP 122/70 | HR 59 | Temp 97.5°F | Ht 69.0 in | Wt 190.0 lb

## 2019-05-11 DIAGNOSIS — I480 Paroxysmal atrial fibrillation: Secondary | ICD-10-CM

## 2019-05-11 DIAGNOSIS — I493 Ventricular premature depolarization: Secondary | ICD-10-CM

## 2019-05-11 DIAGNOSIS — R0609 Other forms of dyspnea: Secondary | ICD-10-CM

## 2019-05-11 DIAGNOSIS — R001 Bradycardia, unspecified: Secondary | ICD-10-CM

## 2019-05-11 NOTE — Patient Instructions (Signed)
Hello Robert Burch, if you have any questions about the following instructions, please feel free to send me a MyChart message back or call [phone].  Thank you! Nira Conn, RN  Medication Instructions:  - Your physician has recommended you make the following change in your medication:   1) HOLD flecainide x 2 weeks - then send a MyChart message stating how you are feeling off of the flecainide. - if you feel no different after 2 weeks off of flecainide, we will order a 2 week heart monitor for you to wear (ZIO patch)  If you need a refill on your cardiac medications before your next appointment, please call your pharmacy.   Lab work: - none ordered  If you have labs (blood work) drawn today and your tests are completely normal, you will receive your results only by: Marland Kitchen MyChart Message (if you have MyChart) OR . A paper copy in the mail If you have any lab test that is abnormal or we need to change your treatment, we will call you to review the results.  Testing/Procedures: - none ordered  Follow-Up: At [facility], you and your health needs are our priority.  As part of our continuing mission to provide you with exceptional heart care, we have created designated Provider Care Teams.  These Care Teams include your primary Cardiologist (physician) and Advanced Practice Providers (APPs -  Physician Assistants and Nurse Practitioners) who all work together to provide you with the care you need, when you need it. . pending how you do off flecainide  Any Other Special Instructions Will Be Listed Below (If Applicable). - N/A

## 2019-05-25 ENCOUNTER — Telehealth: Payer: Self-pay | Admitting: Internal Medicine

## 2019-05-25 NOTE — Telephone Encounter (Signed)
Patient is calling to give an update and states he is doing fair. States his SOB has cleared up, but  Flecainide has been stopped and he occasionally gets palpitations and lightheadedness.and low HR

## 2019-05-26 MED ORDER — FLECAINIDE ACETATE 50 MG PO TABS: ORAL_TABLET | ORAL | Status: DC

## 2019-05-26 NOTE — Telephone Encounter (Signed)
Scheduled 7/16

## 2019-05-26 NOTE — Telephone Encounter (Signed)
Noted- Flecainide changed on med list.  Message to scheduling to please reach to the patient to schedule an appt for 4-6 weeks out- can schedule as in office with the understanding this may need to be changed to an e-visit depending on the current COVID-190 situation at that time.

## 2019-05-26 NOTE — Telephone Encounter (Signed)
Called and spoke with Pt  We have decided to have him use his flecainide prn   Followup in 4-6 weeks but avoid July 27 Heather Thanks SK

## 2019-05-26 NOTE — Telephone Encounter (Signed)
Per his e-visit on 05/11/19 with Dr. [name]- instructions were to:   HOLD flecainide x 2 weeks - then send a MyChart message stating how you are feeling off of the flecainide. - if you feel no different after 2 weeks off of flecainide, we will order a 2 week heart monitor for you to wear (ZIO patch)  Will review with Dr. [name] to see if he feels a ZIO is warranted.

## 2019-05-29 ENCOUNTER — Telehealth: Payer: Self-pay | Admitting: Internal Medicine

## 2019-05-29 NOTE — Telephone Encounter (Signed)
Patient calling to see if the Hgb A1c would be added to his INR check on 06/19/19. States he gets management of INR through PCP at [facility]. Advised him that I do not see where lab work was ordered at [employer] Visit with Dr [name] on 05/11/19. Last A1c was 12/2017 (visible in [place]). Patient says Dr [name] mentioned he needed this and to get it done at next INR check. Advised patient to cal his PCP to see if this is something they are planning to draw for his annual follow up with [facility] in July as he said is scheduled. He is going to f/u with PCP. Meanwhile, I will route to Alvis Lemmings, RN incase she is aware of lab work.

## 2019-05-29 NOTE — Telephone Encounter (Signed)
Patient had a virtual with Dr [name] 5/21 States they spoke of doing an A1C, patient does not see on lab work he will be having on 6/29 and would like to know when to have completed  Please call to discuss

## 2019-05-30 NOTE — Telephone Encounter (Signed)
We did not order labs on him- I'm pretty sure Dr. [name] meant for him to get a HbgA1C with his PCP. Agree with RN advise from 05/29/19 that he will need to discuss this with his PCP.

## 2019-06-15 ENCOUNTER — Ambulatory Visit: Payer: Self-pay | Admitting: Urology

## 2019-07-03 ENCOUNTER — Ambulatory Visit: Payer: Self-pay | Admitting: Urology

## 2019-07-04 ENCOUNTER — Telehealth: Payer: Self-pay

## 2019-07-04 NOTE — Telephone Encounter (Signed)

## 2019-07-06 ENCOUNTER — Encounter: Payer: Self-pay | Admitting: Internal Medicine

## 2019-07-06 ENCOUNTER — Ambulatory Visit (INDEPENDENT_AMBULATORY_CARE_PROVIDER_SITE_OTHER): Payer: Medicare Other | Admitting: Internal Medicine

## 2019-07-06 ENCOUNTER — Other Ambulatory Visit: Payer: Self-pay

## 2019-07-06 ENCOUNTER — Other Ambulatory Visit: Payer: Self-pay | Admitting: Cardiovascular Disease

## 2019-07-06 VITALS — BP 158/80 | HR 58 | Ht 69.0 in | Wt 188.1 lb

## 2019-07-06 DIAGNOSIS — R001 Bradycardia, unspecified: Secondary | ICD-10-CM

## 2019-07-06 DIAGNOSIS — I491 Atrial premature depolarization: Secondary | ICD-10-CM | POA: Insufficient documentation

## 2019-07-06 DIAGNOSIS — I493 Ventricular premature depolarization: Secondary | ICD-10-CM

## 2019-07-06 DIAGNOSIS — Z79899 Other long term (current) drug therapy: Secondary | ICD-10-CM

## 2019-07-06 DIAGNOSIS — I480 Paroxysmal atrial fibrillation: Secondary | ICD-10-CM

## 2019-07-06 MED ORDER — FLECAINIDE ACETATE 50 MG PO TABS: ORAL_TABLET | ORAL | 3 refills | Status: DC

## 2019-07-06 NOTE — Telephone Encounter (Signed)
Robert Burch can you review this refill, the patient stated he was not currently taking and the note does not specifically mention the flecainide.

## 2019-07-06 NOTE — Patient Instructions (Addendum)
Medication Instructions:   Your physician has recommended you make the following change in your medication:  1. Increase Valstartan to 40MG - take one tablet by mouth once daily.   2. We have refilled flecainide 50 mg- take 2 tablets (100 mg) by mouth twice daily as needed for breakthrough a-fib.  If you need a refill on your cardiac medications before your next appointment, please call your pharmacy.   Lab work: Your physician recommends that you have lab work today: BMP, CBC  If you have labs (blood work) drawn today and your tests are completely normal, you will receive your results only by: Marland Kitchen MyChart Message (if you have MyChart) OR . A paper copy in the mail If you have any lab test that is abnormal or we need to change your treatment, we will call you to review the results.  Testing/Procedures: NONE Ordered  Follow-Up: At [facility], you and your health needs are our priority.  As part of our continuing mission to provide you with exceptional heart care, we have created designated Provider Care Teams.  These Care Teams include your primary Cardiologist (physician) and Advanced Practice Providers (APPs -  Physician Assistants and Nurse Practitioners) who all work together to provide you with the care you need, when you need it. . You will need a follow up appointment in 4 months.  Please call our office 2 months in advance to schedule this appointment.

## 2019-07-06 NOTE — Progress Notes (Signed)
Patient Care Team: [name], MD as PCP - General (Family Medicine) Deboraha Sprang, MD as Consulting Physician (Cardiology) Minna Merritts, MD as Consulting Physician (Cardiology)   HPI  Robert Burch is a 85 y.o. male Seen in followup for paroxysmal atrial fibrillation in the setting of nonischemic coronary disease; he has been managed with flecainide  His atrial fib is largely quiet currently. He was referred to Dr Clearnce Hasten for consideration of pulm vein isolation; he declined ablation.  Seen by telehealth visit  5/20 w complaints of dyspnea, evident at the onset of exercise, but improved with ongoing effort  Had also noted bradycardia and more recently has had heart rates in the high 40s.  Not withstanding, as noted below he is overall feeling better.  At his request undertook exclusion trial of flecainide as the cause of his dyspnea-- felt some what better, and after telephone call elected to try and use his flecainide prn--he continues to feel better.  He has however had one interval episode of atrial fibrillation that lasted a couple of hours.  He took flecainide 50 mg x 2 with resolution within a few hours.  He has noted lightheadedness and grogginess with standing.  It is basically end of the day.  He has noted his blood pressure is also higher at the end of the day.  Headaches that are intermittent but longstanding i.e. over years.  Typically right-sided    DATE TEST    7/16    Echo   EF 55-60 % Mild LAE  7/16    myoview   EF 35 % No ischemia-gut artifact         DATE PR interval QRSduration Dose-BID  3/10  150 114 50  5/18 180 114 75  5/19 184 116 75   Date Cr K Hgb  5/19 1.0 5.2 13.1             Records and Results Reviewed notes from Dr. Deidre Ala Holter monitors  Past Medical History:  Diagnosis Date  . Atypical chest pain    a. Nonobstructive, very minor irregs by cath 2007;  b. 06/2009 Ex MV: small area of apical lateral ischemia;  c.  06/2009 Cath: essentially normal;  d. 04/2014 Lexiscan MV: EF 59%, no ischemia/infarct->low risk.  . Cancer ([place])   . Chronic anticoagulation    a. Chronic Coumadin  . Chronic anxiety   . Diverticulosis   . GERD (gastroesophageal reflux disease)   . Hypertension   . IBS (irritable bowel syndrome)   . Nonischemic cardiomyopathy ([place])    a. EF 45% by echo 2010;  b. 05/2012 Echo: EF 55-65%, no rwma, Gr 1 DD, mild AI/MR, mildly dil LA.  . Paralyzed vocal cords   . Paroxysmal atrial fibrillation ([place])    a. Treated with flecainide  . Proteinuria    History of    Past Surgical History:  Procedure Laterality Date  . CARDIAC CATHETERIZATION    . cataract surgery Bilateral   . MASS BIOPSY  2013    Current Outpatient Medications  Medication Sig Dispense Refill  . acidophilus (RISAQUAD) CAPS capsule Take 1 capsule by mouth daily.    . Cholecalciferol (VITAMIN D3) 400 UNITS CAPS Take 800 Units by mouth daily.     . Cobalamine Combinations (VITAMIN B12-FOLIC ACID) 160-109 MCG TABS Take 1 tablet by mouth daily.    . Coenzyme Q10 100 MG capsule Take 1 capsule by mouth 2 (two) times daily.     Marland Kitchen  Cranberry 500 MG CAPS Take 500 mg by mouth daily.    . diclofenac sodium (VOLTAREN) 1 % GEL Apply 2-4 g topically every 6 (six) hours as needed.     . finasteride (PROSCAR) 5 MG tablet Take 1 tablet (5 mg total) by mouth daily. 30 tablet 11  . Flaxseed, Linseed, (FLAX SEED OIL) 1000 MG CAPS Take 1 capsule by mouth daily.      Marland Kitchen gabapentin (NEURONTIN) 100 MG capsule Take 200 mg by mouth at bedtime.    . hydrocortisone (ANUSOL-HC) 25 MG suppository Place 25 mg rectally daily as needed for hemorrhoids or anal itching.    . magnesium oxide (MAG-OX) 400 MG tablet TAKE 1 TABLET BY MOUTH TWICE A DAY 180 tablet 3  . Multiple Vitamin (MULTIVITAMIN) tablet Take 1 tablet by mouth daily.      . nitroGLYCERIN (NITROSTAT) 0.4 MG SL tablet PLACE 1 TABLET (0.4 MG TOTAL) UNDER THE TONGUE EVERY 5 (FIVE) MINUTES AS NEEDED  FOR CHEST PAIN. 90 tablet 0  . Omega-3 Fatty Acids (FISH OIL) 1000 MG CAPS Take 1 capsule by mouth daily.      . pantoprazole (PROTONIX) 40 MG tablet Take 40 mg by mouth daily.     . polyethylene glycol (MIRALAX / GLYCOLAX) packet Take 17 g by mouth daily as needed for mild constipation or moderate constipation.    . saw palmetto 160 MG capsule Take 160 mg by mouth 2 (two) times daily.    . sucralfate (CARAFATE) 1 g tablet Take 1 g 2 (two) times daily by mouth.  3  . tamsulosin (FLOMAX) 0.4 MG CAPS capsule Take 1 capsule (0.4 mg total) by mouth 2 (two) times daily. 60 capsule 6  . traMADol (ULTRAM) 50 MG tablet Take 50 mg by mouth every 6 (six) hours as needed for moderate pain.    . valsartan (DIOVAN) 40 MG tablet Take 0.5 tablets (20 mg total) by mouth daily. 90 tablet 3  . warfarin (COUMADIN) 2.5 MG tablet Take 2.5 mg by mouth every evening.      No current facility-administered medications for this visit.     Allergies  Allergen Reactions  . Ciprofloxacin     Diarrhea and stomach pains  . Lanoxin [Digoxin]   . Lopressor [Metoprolol Tartrate]   . Sulfonamide Derivatives     Rash   . Verapamil       Review of Systems negative except from HPI and PMH  Physical Exam BP (!) 158/80 (BP Location: Left Arm, Patient Position: Sitting, Cuff Size: Normal)   Pulse (!) 58   Ht 5\' 9"  (1.753 m)   Wt 188 lb 1.9 oz (85.3 kg)   BMI 27.78 kg/m    Well developed and nourished in no acute distress HENT normal Neck supple with JVP-  flat    Clear Regular rate and rhythm, no murmurs or gallops Abd-soft with active BS No Clubbing cyanosis edema Skin-warm and dry A & Oriented  Grossly normal sensory and motor function  ECG sinus @ 58 13/09/39  \ Assessment and  Plan  Atrial fibrillation-paroxysmal  PACs/PVCs  Sinus bradycardia  Hypertension  Orthostatic hypotension  Headaches   Intermittent atrial fibrillation.  We will continue to use flecainide as needed.  He is  concerned about his bradycardia; I suspect he will come to pacing.  As there however are currently even fewer symptoms with the discontinuation of his flecainide, we will follow this.  I told him to let us know if his heart rates  in the in the 40s on a regular basis.  He has hypertension and significant supine hypertension with orthostatic lightheadedness.  We have reviewed the physiology.  We will have him increase his [place]ovan from 20--40 continuing to take it at night but will have him take it earlier at suppertime rather than any late evening so as to hopefully mitigate its effects of lightheadedness in the early morning  On Anticoagulation;  No bleeding issues   His last potassium was elevated.  We will check a metabolic profile today.  Also needs to check CBC   We spent more than 50% of our >25 min visit in face to face counseling regarding the above

## 2019-07-07 LAB — CBC
Hematocrit: 42.3 % (ref 37.5–51.0)
Hemoglobin: 14 g/dL (ref 13.0–17.7)
MCH: 27.3 pg (ref 26.6–33.0)
MCHC: 33.1 g/dL (ref 31.5–35.7)
MCV: 83 fL (ref 79–97)
Platelets: 272 10*3/uL (ref 150–450)
RBC: 5.13 x10E6/uL (ref 4.14–5.80)
RDW: 14.1 % (ref 11.6–15.4)
WBC: 7 10*3/uL (ref 3.4–10.8)

## 2019-07-07 LAB — BASIC METABOLIC PANEL
BUN/Creatinine Ratio: 12 (ref 10–24)
BUN: 11 mg/dL (ref 8–27)
CO2: 18 mmol/L — ABNORMAL LOW (ref 20–29)
Calcium: 9.2 mg/dL (ref 8.6–10.2)
Chloride: 96 mmol/L (ref 96–106)
Creatinine, Ser: 0.91 mg/dL (ref 0.76–1.27)
GFR calc Af Amer: 90 mL/min/{1.73_m2} (ref >59)
GFR calc non Af Amer: 78 mL/min/{1.73_m2} (ref >59)
Glucose: 93 mg/dL (ref 65–99)
Potassium: 5.5 mmol/L — ABNORMAL HIGH (ref 3.5–5.2)
Sodium: 136 mmol/L (ref 134–144)

## 2019-07-10 ENCOUNTER — Other Ambulatory Visit
Admission: RE | Admit: 2019-07-10 | Discharge: 2019-07-10 | Disposition: A | Discharge status: Home / Self Care | Payer: Medicare Other | Attending: Internal Medicine | Admitting: Internal Medicine

## 2019-07-10 ENCOUNTER — Other Ambulatory Visit: Payer: Self-pay

## 2019-07-10 ENCOUNTER — Telehealth: Payer: Self-pay | Admitting: Internal Medicine

## 2019-07-10 DIAGNOSIS — Z79899 Other long term (current) drug therapy: Secondary | ICD-10-CM

## 2019-07-10 LAB — BASIC METABOLIC PANEL
Anion gap: 7 (ref 5–15)
BUN: 12 mg/dL (ref 8–23)
CO2: 25 mmol/L (ref 22–32)
Calcium: 8.8 mg/dL — ABNORMAL LOW (ref 8.9–10.3)
Chloride: 98 mmol/L (ref 98–111)
Creatinine, Ser: 0.85 mg/dL (ref 0.61–1.24)
GFR calc Af Amer: >60 mL/min (ref >60)
GFR calc non Af Amer: >60 mL/min (ref >60)
Glucose, Bld: 100 mg/dL — ABNORMAL HIGH (ref 70–99)
Potassium: 4.6 mmol/L (ref 3.5–5.1)
Sodium: 130 mmol/L — ABNORMAL LOW (ref 135–145)

## 2019-07-10 NOTE — Telephone Encounter (Signed)
IVa agree

## 2019-07-10 NOTE — Telephone Encounter (Signed)
Patient calling in with questions from Dr. [name] appointment, says he was instructed to reach back out to his nurse on Monday or Tuesday.   Please advise

## 2019-07-10 NOTE — Telephone Encounter (Signed)
Returned call to patient. He said that Dr. [name] called him late Friday evening and told him to hold valsartan and get repeat blood taken today. Last taken valsartan on Thursday.  He feels like HR remained stable over the weekend but did continue to have lightheadedness. Cannot differentiate exacerbating sx.     Sat HR 48-61 Sun  9 am 121/71, HR 58 11 am 142/81, HR 50 3 pm 139/77, HR54 8 pm 126/79, HR 56  Today 141/83, HR 53  I wrote order for BMP and instructed pt to report to [facility] mall for labs today.   Pt is requesting to continue holding BP medication as he feels, "it is not too high".   Message routed to Dr. [name] as Juluis Rainier.

## 2019-07-18 ENCOUNTER — Telehealth: Payer: Self-pay | Admitting: Internal Medicine

## 2019-07-18 NOTE — Telephone Encounter (Signed)
New Message ° ° °Patient returning your call. °

## 2019-07-18 NOTE — Telephone Encounter (Signed)
Notes recorded by Deboraha Sprang, MD on 07/14/2019 at 3:49 PM EDT  Please Inform Patient that labs are normal >> K is back to normal  What is happening with his BP plez  Thanks

## 2019-07-18 NOTE — Telephone Encounter (Signed)
Noted  

## 2019-07-18 NOTE — Telephone Encounter (Signed)
I attempted to call the patient back. No answer- I left a message I will call back later.

## 2019-07-18 NOTE — Telephone Encounter (Signed)
I spoke with the patient regarding his lab results.  He states he has been doing well.  He does have to "be careful" with some lightheadedness.  BP (HR) readings are: 07/14/19- 133/73 (49) 07/15/19- 130/78 (58) 7/26- 137/82 (53) 7/27- 125/70 (56) at home (10:30 am)           124/70 (58) at Dr. Reuel Boom office (3:00 pm)   The patient states he is having to stay well hydrated to offset some of his dizziness.  I advised his sodium is a little below normal at 130. I have advised that he increase his sodium intake with the fluid intake.  The patient advised Dr. Lovie Macadamia is watching his K+ & Na+ closely as well. He is due for a repeat BMP/ INR on 07/24/19.  He is aware I will forward this information to Dr. [name] to review and call him back with any further recommendations.  The patient voices understanding and is agreeable.

## 2019-07-18 NOTE — Telephone Encounter (Signed)
Attempted to call the patient. No answer- I left a message to please call back.  

## 2019-08-21 ENCOUNTER — Other Ambulatory Visit: Payer: Self-pay | Admitting: *Deleted

## 2019-08-21 MED ORDER — FINASTERIDE 5 MG PO TABS: 5.0000 mg | ORAL_TABLET | Freq: Every day | ORAL | 11 refills | Status: DC

## 2019-08-30 ENCOUNTER — Ambulatory Visit: Payer: Self-pay | Admitting: Urology

## 2019-09-06 ENCOUNTER — Other Ambulatory Visit: Payer: Self-pay

## 2019-09-06 ENCOUNTER — Ambulatory Visit: Payer: Medicare Other | Admitting: Urology

## 2019-09-06 ENCOUNTER — Encounter: Payer: Self-pay | Admitting: Urology

## 2019-09-06 VITALS — BP 175/85 | HR 67 | Ht 69.0 in | Wt 190.0 lb

## 2019-09-06 DIAGNOSIS — N401 Enlarged prostate with lower urinary tract symptoms: Secondary | ICD-10-CM

## 2019-09-06 DIAGNOSIS — N3281 Overactive bladder: Secondary | ICD-10-CM | POA: Diagnosis not present

## 2019-09-06 DIAGNOSIS — N138 Other obstructive and reflux uropathy: Secondary | ICD-10-CM | POA: Diagnosis not present

## 2019-09-06 LAB — BLADDER SCAN AMB NON-IMAGING

## 2019-09-06 NOTE — Progress Notes (Signed)
   09/06/2019 4:12 PM   Irven Shelling Szczygiel 1933-09-10 [id number]  Reason for visit: Follow up BPH/LUTS  HPI: I saw Mr. Som back in urology clinic for follow-up of BPH and LUTS.  He is an 85 year old male with history notable for atrial fibrillation on Coumadin and long history of urinary symptoms.  He was previously followed by Dr. Jacqlyn Larsen.  A CT scan in 2019 demonstrated a very large 125 g prostate.  He has been on Flomax and finasteride with moderate improvement in his symptoms.  PSAs had been within the normal range, and were discontinued per AUA guidelines.  He denies any urinary tract infections, episodes of retention, or gross hematuria.  His primary complaint today is urinary urgency, occasional urge incontinence, and difficulty stopping the stream.  He particularly has problem when he runs water that he will have urgency to urinate.  He reports his stream has improved over the last year since starting finasteride.  IPSS score today is 12, with quality of life mixed.  PVR 80 mL.  We discussed options moving forward including a trial of adding an overactive bladder medication like Myrbetriq, staying on his current regimen of Flomax/finasteride, or considering an outlet procedure like a HoLEP.  I would be hesitant to offer him HOLEP without urodynamics in the setting of his age, comorbidities, mixed symptoms, and blood thinners.  Trial of Myrbetriq 50 mg daily Virtual visit in 1 month to discuss symptoms If patient is still bothered by urinary symptoms could consider urodynamics  A total of 15 minutes were spent face-to-face with the patient, greater than 50% was spent in patient education, counseling, and coordination of care regarding BPH and overactive bladder and treatment options.  Billey Co, [facility] [address] [place], [place] [address] [phone]

## 2019-09-08 ENCOUNTER — Telehealth: Payer: Self-pay | Admitting: Internal Medicine

## 2019-09-08 NOTE — Telephone Encounter (Signed)
No  M  thnks  Would keep it the same-- and thx for all the information

## 2019-09-08 NOTE — Telephone Encounter (Signed)
Patient states that his urologist prescribed Myrbetriq. He wants to make sure this doesn't interfere with his heart medications. Please call to discuss.

## 2019-09-08 NOTE — Telephone Encounter (Signed)
Patient advised of Dr.Klein recommendation to keep prn flecainide dose the same. Patient appreciative of the call.

## 2019-09-08 NOTE — Telephone Encounter (Signed)
Spoke with patient about Myrbetriq interaction with flecainide. myrbetriq can increase the concentration of flecanide. Patient states that he only takes flecanidine prn for afib episode.  [place] recommendation is to use no more than mirabegron than 25mg /day with felcandine (pt is on 50mg /day of mirabegron).  Since he does not take flecainide daily advised it is ok to take myrbetriq. I will forward to Dr. [name] to see if he would like to decrease the dose of flecainide he uses prn or keep the same.

## 2019-09-08 NOTE — Telephone Encounter (Signed)
Dr. [name] is currently out of the office. Will forward to pharmacy to review.

## 2019-09-18 ENCOUNTER — Telehealth: Payer: Self-pay | Admitting: Urology

## 2019-09-18 NOTE — Telephone Encounter (Signed)
Please advise 

## 2019-09-18 NOTE — Telephone Encounter (Signed)
Pt called and states that his BP had started rising last week while on Myrbetriq. He has since stopped Myrbetriq and his BP has went back to normal. He wants to know if he can try the Myrbetriq every other day to see if that works.He also states that he would get "light episodes of AFIB" but it would resolve quickly. Please advise.

## 2019-09-19 NOTE — Telephone Encounter (Signed)
Patient notified he will try every other day for a week and call back if BP remains elevated

## 2019-09-19 NOTE — Telephone Encounter (Signed)
OK to try every other day and monitor BP, thanks  Nickolas Madrid, MD 09/19/2019

## 2019-10-18 ENCOUNTER — Other Ambulatory Visit: Payer: Self-pay | Admitting: Urology

## 2019-11-01 ENCOUNTER — Telehealth (INDEPENDENT_AMBULATORY_CARE_PROVIDER_SITE_OTHER): Payer: Medicare Other | Admitting: Urology

## 2019-11-01 ENCOUNTER — Other Ambulatory Visit: Payer: Self-pay

## 2019-11-01 ENCOUNTER — Telehealth: Payer: Self-pay | Admitting: Urology

## 2019-11-01 DIAGNOSIS — N401 Enlarged prostate with lower urinary tract symptoms: Secondary | ICD-10-CM

## 2019-11-01 DIAGNOSIS — N138 Other obstructive and reflux uropathy: Secondary | ICD-10-CM | POA: Diagnosis not present

## 2019-11-01 DIAGNOSIS — N3281 Overactive bladder: Secondary | ICD-10-CM

## 2019-11-01 MED ORDER — MIRABEGRON ER 25 MG PO TB24: 25.0000 mg | ORAL_TABLET | Freq: Every day | ORAL | 11 refills | Status: DC

## 2019-11-01 NOTE — Telephone Encounter (Signed)
-----   Message from Billey Co, MD sent at 11/01/2019  3:53 PM EST ----- Regarding: follow up Please schedule follow up in 6 weeks with PVR, thanks  Nickolas Madrid, MD 11/01/2019

## 2019-11-01 NOTE — Telephone Encounter (Signed)
done

## 2019-11-01 NOTE — Progress Notes (Signed)
Virtual Visit via Telephone Note  I connected with Robert Burch on 11/01/19 at  3:45 PM EST by telephone and verified that I am speaking with the correct person using two identifiers.   I discussed the limitations, risks, security and privacy concerns of performing an evaluation and management service by telephone and the availability of in person appointments. We discussed the impact of the COVID-19 pandemic on the healthcare system, and the importance of social distancing and reducing patient and provider exposure. I also discussed with the patient that there may be a patient responsible charge related to this service. The patient expressed understanding and agreed to proceed.  Reason for visit: BPH/OAB  History of Present Illness: I had phone follow-up with [name] today for his urinary symptoms.  He is an 85 year old male on Coumadin for atrial fibrillation and a long history of urinary symptoms.  He was previously followed by Dr. Jacqlyn Larsen.  CT scan in 2019 demonstrated a very large 125 g prostate.  He is on maximal medical therapy with Flomax and finasteride with moderate improvement in his symptoms.  PSAs have been normal, and were discontinued per AUA guidelines.  Most recently, his chief complaint was urinary urgency and occasional urge incontinence.  PVRs have been less than 100 mL.  We had elected a trial of Myrbetriq.  He felt like his blood pressure was increasing on the Myrbetriq 50 mg daily, and he started taking it every other day with moderate improvement in his symptoms.  He has since run out of the medication.    He would like to try Myrbetriq 25 mg daily to see if this improves his frequency without altering his blood pressure.  With his comorbidities and overactive symptoms I would be hesitant to pursue an outlet procedure like HOLEP without urodynamics.   Follow Up: RTC in person in 6 weeks Consider urodynamics if ongoing bothersome symptoms   I discussed the  assessment and treatment plan with the patient. The patient was provided an opportunity to ask questions and all were answered. The patient agreed with the plan and demonstrated an understanding of the instructions.   The patient was advised to call back or seek an in-person evaluation if the symptoms worsen or if the condition fails to improve as anticipated.  I provided 11 minutes of non-face-to-face time during this encounter.   Billey Co, MD

## 2019-11-09 ENCOUNTER — Encounter: Payer: Self-pay | Admitting: Internal Medicine

## 2019-11-09 ENCOUNTER — Ambulatory Visit (INDEPENDENT_AMBULATORY_CARE_PROVIDER_SITE_OTHER): Payer: Medicare Other | Admitting: Internal Medicine

## 2019-11-09 VITALS — BP 164/110 | HR 64 | Temp 97.4°F | Ht 69.0 in | Wt 192.5 lb

## 2019-11-09 DIAGNOSIS — I493 Ventricular premature depolarization: Secondary | ICD-10-CM | POA: Diagnosis not present

## 2019-11-09 DIAGNOSIS — I491 Atrial premature depolarization: Secondary | ICD-10-CM | POA: Diagnosis not present

## 2019-11-09 DIAGNOSIS — I10 Essential (primary) hypertension: Secondary | ICD-10-CM

## 2019-11-09 DIAGNOSIS — I480 Paroxysmal atrial fibrillation: Secondary | ICD-10-CM | POA: Diagnosis not present

## 2019-11-09 MED ORDER — DILTIAZEM HCL 30 MG PO TABS: ORAL_TABLET | ORAL | 11 refills | Status: DC

## 2019-11-09 NOTE — Patient Instructions (Signed)
Medication Instructions:  - Your physician has recommended you make the following change in your medication:   1) Start diltiazem 30 mg- take 1 tablet by mouth once daily at bedtime  *If you need a refill on your cardiac medications before your next appointment, please call your pharmacy*  Lab Work: - none ordered  If you have labs (blood work) drawn today and your tests are completely normal, you will receive your results only by: Marland Kitchen MyChart Message (if you have MyChart) OR . A paper copy in the mail If you have any lab test that is abnormal or we need to change your treatment, we will call you to review the results.  Testing/Procedures: - none ordered  Follow-Up: At [facility], you and your health needs are our priority.  As part of our continuing mission to provide you with exceptional heart care, we have created designated Provider Care Teams.  These Care Teams include your primary Cardiologist (physician) and Advanced Practice Providers (APPs -  Physician Assistants and Nurse Practitioners) who all work together to provide you with the care you need, when you need it.  Your next appointment:   12 month(s)  The format for your next appointment:   In Person  Provider:   Virl Axe, MD  Other Instructions - n/a

## 2019-11-09 NOTE — Progress Notes (Signed)
Patient Care Team: [name], MD as PCP - General (Family Medicine) Deboraha Sprang, MD as Consulting Physician (Cardiology) Minna Merritts, MD as Consulting Physician (Cardiology)   HPI  Robert Burch is a 85 y.o. male Seen in followup for paroxysmal atrial fibrillation in the setting of nonischemic coronary disease; Managed with flecainide taking regularly, but more recently prn. Previously referred to Dr Clearnce Hasten for consideration of pulm vein isolation; pt declined ablation anticoagulation with warfarin  .  Seen by telehealth visit  5/20 w complaints of dyspnea, evident at the onset of exercise, but improved with ongoing effort  Had also noted bradycardia and more recently has had heart rates in the high 40s.  Not withstanding, as noted below he is overall feeling better.  At his request undertook exclusion trial of flecainide as the cause of his dyspnea-- felt some what better, and after telephone call elected to try and use his flecainide prn--he continues to feel better.  He has however had one interval episode of atrial fibrillation that lasted a couple of hours.  He took flecainide 50 mg x 2 with resolution within a few hours.  No interval atrial fibrillation  Has had some chest pain.  Bilateral upper chest.  Associate with raking leaves and using the leaf blower.  Does not notice it when he walks.  Was started on Myrbetriq.  This is been associated with elevated blood pressure.  The dose was decreased from 50--25 mg; blood pressures have remained modestly elevated.  We have however stopped his valsartan because of orthostatic hypotension and the lightheadedness has abated.  Blood pressures at home normally range 13520.      DATE TEST    7/16    Echo   EF 55-60 % Mild LAE  7/16    myoview   EF 35 % No ischemia-gut artifact         DATE PR interval QRSduration Dose-BID  3/10  150 114 50  5/18 180 114 75  5/19 184 116 75   Date Cr K Hgb  5/19  1.0 5.2 13.1`  8/20 0.9 4.4 13.3       Records and Results Reviewed notes from Dr. Deidre Ala Holter monitors  Past Medical History:  Diagnosis Date  . Atypical chest pain    a. Nonobstructive, very minor irregs by cath 2007;  b. 06/2009 Ex MV: small area of apical lateral ischemia;  c. 06/2009 Cath: essentially normal;  d. 04/2014 Lexiscan MV: EF 59%, no ischemia/infarct->low risk.  . Cancer ([place])   . Chronic anticoagulation    a. Chronic Coumadin  . Chronic anxiety   . Diverticulosis   . GERD (gastroesophageal reflux disease)   . Hypertension   . IBS (irritable bowel syndrome)   . Nonischemic cardiomyopathy ([place])    a. EF 45% by echo 2010;  b. 05/2012 Echo: EF 55-65%, no rwma, Gr 1 DD, mild AI/MR, mildly dil LA.  . Paralyzed vocal cords   . Paroxysmal atrial fibrillation ([place])    a. Treated with flecainide  . Proteinuria    History of    Past Surgical History:  Procedure Laterality Date  . CARDIAC CATHETERIZATION    . cataract surgery Bilateral   . MASS BIOPSY  2013    Current Outpatient Medications  Medication Sig Dispense Refill  . acidophilus (RISAQUAD) CAPS capsule Take 1 capsule by mouth daily.    . Cholecalciferol (VITAMIN D3) 400 UNITS CAPS Take 800 Units by mouth  daily.     . Cobalamine Combinations (VITAMIN B12-FOLIC ACID) XX123456 MCG TABS Take 1 tablet by mouth daily.    . Coenzyme Q10 100 MG capsule Take 1 capsule by mouth 2 (two) times daily.     . Cranberry 500 MG CAPS Take 500 mg by mouth daily.    . diclofenac sodium (VOLTAREN) 1 % GEL Apply 2-4 g topically every 6 (six) hours as needed.     . finasteride (PROSCAR) 5 MG tablet Take 1 tablet (5 mg total) by mouth daily. 30 tablet 11  . Flaxseed, Linseed, (FLAX SEED OIL) 1000 MG CAPS Take 1 capsule by mouth daily.      . flecainide (TAMBOCOR) 50 MG tablet Take 2 tablets (100 mg) by mouth twice daily as needed for recurrent atrial fibrillation 12 tablet 3  . gabapentin (NEURONTIN) 100 MG capsule Take 200 mg by mouth  at bedtime.    . hydrocortisone (ANUSOL-HC) 25 MG suppository Place 25 mg rectally daily as needed for hemorrhoids or anal itching.    . magnesium oxide (MAG-OX) 400 MG tablet TAKE 1 TABLET BY MOUTH TWICE A DAY 180 tablet 3  . mirabegron ER (MYRBETRIQ) 25 MG TB24 tablet Take 1 tablet (25 mg total) by mouth daily. 30 tablet 11  . Multiple Vitamin (MULTIVITAMIN) tablet Take 1 tablet by mouth daily.      . nitroGLYCERIN (NITROSTAT) 0.4 MG SL tablet PLACE 1 TABLET (0.4 MG TOTAL) UNDER THE TONGUE EVERY 5 (FIVE) MINUTES AS NEEDED FOR CHEST PAIN. 90 tablet 0  . Omega-3 Fatty Acids (FISH OIL) 1000 MG CAPS Take 1 capsule by mouth daily.      . pantoprazole (PROTONIX) 40 MG tablet Take 40 mg by mouth daily.     . polyethylene glycol (MIRALAX / GLYCOLAX) packet Take 17 g by mouth daily as needed for mild constipation or moderate constipation.    . saw palmetto 160 MG capsule Take 160 mg by mouth 2 (two) times daily.    . sucralfate (CARAFATE) 1 g tablet Take 1 g 2 (two) times daily by mouth.  3  . tamsulosin (FLOMAX) 0.4 MG CAPS capsule TAKE 1 CAPSULE (0.4 MG TOTAL) BY MOUTH 2 (TWO) TIMES DAILY. 180 capsule 2  . traMADol (ULTRAM) 50 MG tablet Take 50 mg by mouth every 6 (six) hours as needed for moderate pain.    . valsartan (DIOVAN) 40 MG tablet Take 20 mg by mouth daily as needed.  90 tablet 3  . warfarin (COUMADIN) 2.5 MG tablet Take 2.5 mg by mouth every evening.     . diltiazem (CARDIZEM) 30 MG tablet Take 1 tablet (30 mg) by mouth once daily at bedtime 30 tablet 11   No current facility-administered medications for this visit.     Allergies  Allergen Reactions  . Ciprofloxacin     Diarrhea and stomach pains  . Lanoxin [Digoxin]   . Lopressor [Metoprolol Tartrate]   . Sulfonamide Derivatives     Rash   . Verapamil       Review of Systems negative except from HPI and PMH  Physical Exam BP (!) 164/110 (BP Location: Left Arm, Patient Position: Sitting, Cuff Size: Normal)   Pulse 64    Temp (!) 97.4 F (36.3 C)   Ht 5\' 9"  (1.753 m)   Wt 192 lb 8 oz (87.3 kg)   SpO2 98%   BMI 28.43 kg/m    Well developed and nourished in no acute distress HENT normal Neck supple with JVP-  flat   Clear Regular rate and rhythm, no murmurs or gallops Abd-soft with active BS No Clubbing cyanosis edema Skin-warm and dry A & Oriented  Grossly normal sensory and motor function  ECG sinus @ 64 16/09/39  Assessment and  Plan  Atrial fibrillation-paroxysmal  PACs/PVCs  Sinus bradycardia  Chest pain  Hypertension  Orthostatic hypotension  Headaches  No interval atrial fibrillation we will continue to use as needed flecainide.  Bradycardia seems better.  Heart rates are in the 55-65 range. Blood pressures have been elevated with the Myrbetriq.  Daytime blood pressures are really pretty good.  I worry a little bit about supine hypertension so we discussed using a nightly short acting blood pressure pill, discussing diltiazem, hydralazine and propranolol.  He has taken diltiazem in the past so we will begin that at 30 mg nightly.  Chest pain syndromes sound musculoskeletal.  We reviewed his walking history which is really quite vigorous.  In the event that he has more symptoms unassociated with upper extremity activity he will let us know.  He is not known to have coronary disease  On Anticoagulation;  No bleeding issues    We spent more than 50% of our >25 min visit in face to face counseling regarding the above

## 2019-12-06 ENCOUNTER — Other Ambulatory Visit: Payer: Self-pay | Admitting: Cardiovascular Disease

## 2019-12-28 ENCOUNTER — Other Ambulatory Visit: Payer: Self-pay

## 2019-12-28 ENCOUNTER — Encounter: Payer: Self-pay | Admitting: Urology

## 2019-12-28 ENCOUNTER — Ambulatory Visit: Payer: Medicare Other | Admitting: Urology

## 2019-12-28 VITALS — BP 145/73 | HR 69 | Ht 69.0 in | Wt 190.0 lb

## 2019-12-28 DIAGNOSIS — N138 Other obstructive and reflux uropathy: Secondary | ICD-10-CM

## 2019-12-28 DIAGNOSIS — N3281 Overactive bladder: Secondary | ICD-10-CM | POA: Diagnosis not present

## 2019-12-28 DIAGNOSIS — N401 Enlarged prostate with lower urinary tract symptoms: Secondary | ICD-10-CM | POA: Diagnosis not present

## 2019-12-28 LAB — BLADDER SCAN AMB NON-IMAGING: Scan Result: 16

## 2019-12-28 MED ORDER — TAMSULOSIN HCL 0.4 MG PO CAPS: 0.4000 mg | ORAL_CAPSULE | Freq: Every day | ORAL | 2 refills | Status: DC

## 2019-12-28 MED ORDER — FINASTERIDE 5 MG PO TABS: 5.0000 mg | ORAL_TABLET | Freq: Every day | ORAL | 11 refills | Status: DC

## 2019-12-28 NOTE — Progress Notes (Signed)
   12/28/2019 1:42 PM   Shephen Lashlee Sybert 1933-02-18 [id number]  Reason for visit: Follow up urinary symptoms  HPI: I saw Mr. Eppinger back in urology clinic to discuss his chronic urinary symptoms.  He is an 86 year old male with history of atrial fibrillation on Coumadin, and a long history of urinary symptoms.  He is currently on maximal medical therapy with Flomax and finasteride, and recently started Myrbetriq 25 mg 2 months ago.  A CT scan in May 2019 demonstrated a large 125 g prostate.  PVR today is 16 mL.  PVRs have always been normal.  His main complaints today are nocturia 2-3 times per night, and urinary frequency and urgency during the day with occasional weak stream and some post void dribbling.  He has mixed symptoms.  He denies any history of urinary retention or UTIs.  He feels the Myrbetriq may have helped moderately but he is unsure.  He does drink juice, coffee, and tea during the day, and tea right before bed.  We had another long conversation about his mixed urinary symptoms and the role of urodynamics in the future if he were to want to pursue an outlet procedure.  We discussed behavioral strategies at length of minimizing bladder irritants like tea in the diet, minimizing fluid before bed, double voiding prior to bed.  He would like to continue medical therapy for the time being and will try the strategies discussed today.  -Continue Flomax, finasteride -Recommended a trial off Myrbetriq to see if his symptoms worsen to decide if he wants to continue this medication secondary to cost -RTC 1 year with IPSS, PVR.  If ongoing bothersome symptoms would recommend urodynamics prior to any outlet procedure with his mixed symptoms  A total of 20 minutes were spent face-to-face with the patient, greater than 50% was spent in patient education, counseling, and coordination of care regarding BPH, overactive bladder, behavioral strategies, and urinary symptoms.  Billey Co,  [facility] [address], [place], [place] [address] [phone]

## 2019-12-28 NOTE — Patient Instructions (Signed)
1, Minimize fluids before bed and cut back on tea 2. Urinate twice right before bed, and minimize fluids after 7pm   Overactive Bladder, Adult  Overactive bladder refers to a condition in which a person has a sudden need to pass urine. The person may leak urine if he or she cannot get to the bathroom fast enough (urinary incontinence). A person with this condition may also wake up several times in the night to go to the bathroom. Overactive bladder is associated with poor nerve signals between your bladder and your brain. Your bladder may get the signal to empty before it is full. You may also have very sensitive muscles that make your bladder squeeze too soon. These symptoms might interfere with daily work or social activities. What are the causes? This condition may be associated with or caused by:  Urinary tract infection.  Infection of nearby tissues, such as the prostate.  Prostate enlargement.  Surgery on the uterus or urethra.  Bladder stones, inflammation, or tumors.  Drinking too much caffeine or alcohol.  Certain medicines, especially medicines that get rid of extra fluid in the body (diuretics).  Muscle or nerve weakness, especially from: ? A spinal cord injury. ? Stroke. ? Multiple sclerosis. ? Parkinson's disease.  Diabetes.  Constipation. What increases the risk? You may be at greater risk for overactive bladder if you:  Are an older adult.  Smoke.  Are going through menopause.  Have prostate problems.  Have a neurological disease, such as stroke, dementia, Parkinson's disease, or multiple sclerosis (MS).  Eat or drink things that irritate the bladder. These include alcohol, spicy food, and caffeine.  Are overweight or obese. What are the signs or symptoms? Symptoms of this condition include:  Sudden, strong urge to urinate.  Leaking urine.  Urinating 8 or more times a day.  Waking up to urinate 2 or more times a night. How is this  diagnosed? Your health care provider may suspect overactive bladder based on your symptoms. He or she will diagnose this condition by:  A physical exam and medical history.  Blood or urine tests. You might need bladder or urine tests to help determine what is causing your overactive bladder. You might also need to see a health care provider who specializes in urinary tract problems (urologist). How is this treated? Treatment for overactive bladder depends on the cause of your condition and whether it is mild or severe. You can also make lifestyle changes at home. Options include:  Bladder training. This may include: ? Learning to control the urge to urinate by following a schedule that directs you to urinate at regular intervals (timed voiding). ? Doing Kegel exercises to strengthen your pelvic floor muscles, which support your bladder. Toning these muscles can help you control urination, even if your bladder muscles are overactive.  Special devices. This may include: ? Biofeedback, which uses sensors to help you become aware of your body's signals. ? Electrical stimulation, which uses electrodes placed inside the body (implanted) or outside the body. These electrodes send gentle pulses of electricity to strengthen the nerves or muscles that control the bladder. ? Women may use a plastic device that fits into the vagina and supports the bladder (pessary).  Medicines. ? Antibiotics to treat bladder infection. ? Antispasmodics to stop the bladder from releasing urine at the wrong time. ? Tricyclic antidepressants to relax bladder muscles. ? Injections of botulinum toxin type A directly into the bladder tissue to relax bladder muscles.  Lifestyle changes.  This may include: ? Weight loss. Talk to your health care provider about weight loss methods that would work best for you. ? Diet changes. This may include reducing how much alcohol and caffeine you consume, or drinking fluids at different  times of the day. ? Not smoking. Do not use any products that contain nicotine or tobacco, such as cigarettes and e-cigarettes. If you need help quitting, ask your health care provider.  Surgery. ? A device may be implanted to help manage the nerve signals that control urination. ? An electrode may be implanted to stimulate electrical signals in the bladder. ? A procedure may be done to change the shape of the bladder. This is done only in very severe cases. Follow these instructions at home: Lifestyle  Make any diet or lifestyle changes that are recommended by your health care provider. These may include: ? Drinking less fluid or drinking fluids at different times of the day. ? Cutting down on caffeine or alcohol. ? Doing Kegel exercises. ? Losing weight if needed. ? Eating a healthy and balanced diet to prevent constipation. This may include:  Eating foods that are high in fiber, such as fresh fruits and vegetables, whole grains, and beans.  Limiting foods that are high in fat and processed sugars, such as fried and sweet foods. General instructions  Take over-the-counter and prescription medicines only as told by your health care provider.  If you were prescribed an antibiotic medicine, take it as told by your health care provider. Do not stop taking the antibiotic even if you start to feel better.  Use any implants or pessary as told by your health care provider.  If needed, wear pads to absorb urine leakage.  Keep a journal or log to track how much and when you drink and when you feel the need to urinate. This will help your health care provider monitor your condition.  Keep all follow-up visits as told by your health care provider. This is important. Contact a health care provider if:  You have a fever.  Your symptoms do not get better with treatment.  Your pain and discomfort get worse.  You have more frequent urges to urinate. Get help right away if:  You are not  able to control your bladder. Summary  Overactive bladder refers to a condition in which a person has a sudden need to pass urine.  Several conditions may lead to an overactive bladder.  Treatment for overactive bladder depends on the cause and severity of your condition.  Follow your health care provider's instructions about lifestyle changes, doing Kegel exercises, keeping a journal, and taking medicines. This information is not intended to replace advice given to you by your health care provider. Make sure you discuss any questions you have with your health care provider. Document Revised: 03/30/2019 Document Reviewed: 12/23/2017 Elsevier Patient Education  [place].   Benign Prostatic Hyperplasia  Benign prostatic hyperplasia (BPH) is an enlarged prostate gland that is caused by the normal aging process and not by cancer. The prostate is a walnut-sized gland that is involved in the production of semen. It is located in front of the rectum and below the bladder. The bladder stores urine and the urethra is the tube that carries the urine out of the body. The prostate may get bigger as a man gets older. An enlarged prostate can press on the urethra. This can make it harder to pass urine. The build-up of urine in the bladder can cause  infection. Back pressure and infection may progress to bladder damage and kidney (renal) failure. What are the causes? This condition is part of a normal aging process. However, not all men develop problems from this condition. If the prostate enlarges away from the urethra, urine flow will not be blocked. If it enlarges toward the urethra and compresses it, there will be problems passing urine. What increases the risk? This condition is more likely to develop in men over the age of 25 years. What are the signs or symptoms? Symptoms of this condition include:  Getting up often during the night to urinate.  Needing to urinate frequently during the  day.  Difficulty starting urine flow.  Decrease in size and strength of your urine stream.  Leaking (dribbling) after urinating.  Inability to pass urine. This needs immediate treatment.  Inability to completely empty your bladder.  Pain when you pass urine. This is more common if there is also an infection.  Urinary tract infection (UTI). How is this diagnosed? This condition is diagnosed based on your medical history, a physical exam, and your symptoms. Tests will also be done, such as:  A post-void bladder scan. This measures any amount of urine that may remain in your bladder after you finish urinating.  A digital rectal exam. In a rectal exam, your health care provider checks your prostate by putting a lubricated, gloved finger into your rectum to feel the back of your prostate gland. This exam detects the size of your gland and any abnormal lumps or growths.  An exam of your urine (urinalysis).  A prostate specific antigen (PSA) screening. This is a blood test used to screen for prostate cancer.  An ultrasound. This test uses sound waves to electronically produce a picture of your prostate gland. Your health care provider may refer you to a specialist in kidney and prostate diseases (urologist). How is this treated? Once symptoms begin, your health care provider will monitor your condition (active surveillance or watchful waiting). Treatment for this condition will depend on the severity of your condition. Treatment may include:  Observation and yearly exams. This may be the only treatment needed if your condition and symptoms are mild.  Medicines to relieve your symptoms, including: ? Medicines to shrink the prostate. ? Medicines to relax the muscle of the prostate.  Surgery in severe cases. Surgery may include: ? Prostatectomy. In this procedure, the prostate tissue is removed completely through an open incision or with a laparoscope or robotics. ? Transurethral  resection of the prostate (TURP). In this procedure, a tool is inserted through the opening at the tip of the penis (urethra). It is used to cut away tissue of the inner core of the prostate. The pieces are removed through the same opening of the penis. This removes the blockage. ? Transurethral incision (TUIP). In this procedure, small cuts are made in the prostate. This lessens the prostate's pressure on the urethra. ? Transurethral microwave thermotherapy (TUMT). This procedure uses microwaves to create heat. The heat destroys and removes a small amount of prostate tissue. ? Transurethral needle ablation (TUNA). This procedure uses radio frequencies to destroy and remove a small amount of prostate tissue. ? Interstitial laser coagulation ([place]). This procedure uses a laser to destroy and remove a small amount of prostate tissue. ? Transurethral electrovaporization (TUVP). This procedure uses electrodes to destroy and remove a small amount of prostate tissue. ? Prostatic urethral lift. This procedure inserts an implant to push the lobes of the prostate  away from the urethra. Follow these instructions at home:  Take over-the-counter and prescription medicines only as told by your health care provider.  Monitor your symptoms for any changes. Contact your health care provider with any changes.  Avoid drinking large amounts of liquid before going to bed or out in public.  Avoid or reduce how much caffeine or alcohol you drink.  Give yourself time when you urinate.  Keep all follow-up visits as told by your health care provider. This is important. Contact a health care provider if:  You have unexplained back pain.  Your symptoms do not get better with treatment.  You develop side effects from the medicine you are taking.  Your urine becomes very dark or has a bad smell.  Your lower abdomen becomes distended and you have trouble passing your urine. Get help right away if:  You have a  fever or chills.  You suddenly cannot urinate.  You feel lightheaded, or very dizzy, or you faint.  There are large amounts of blood or clots in the urine.  Your urinary problems become hard to manage.  You develop moderate to severe low back or flank pain. The flank is the side of your body between the ribs and the hip. These symptoms may represent a serious problem that is an emergency. Do not wait to see if the symptoms will go away. Get medical help right away. Call your local emergency services (911 in the [place] Do not drive yourself to the hospital. Summary  Benign prostatic hyperplasia (BPH) is an enlarged prostate that is caused by the normal aging process and not by cancer.  An enlarged prostate can press on the urethra. This can make it hard to pass urine.  This condition is part of a normal aging process and is more likely to develop in men over the age of 21 years.  Get help right away if you suddenly cannot urinate. This information is not intended to replace advice given to you by your health care provider. Make sure you discuss any questions you have with your health care provider. Document Revised: 11/01/2018 Document Reviewed: 01/11/2017 Elsevier Patient Education  2020 [employer].

## 2019-12-29 NOTE — Progress Notes (Signed)
Cardiology Office Note  Date:  01/01/2020   ID:  Robert Burch, DOB 1933/05/19, MRN [id number]  PCP:  [name], MD   Chief Complaint  Patient presents with  . office visit    Pt 12 month f/u c/o leg pain/aching and sob with exertion. Meds verbally reviewed w/ pt.    HPI:  86 yo with history of  Anxiety concerning his health History of sweats paroxysmal atrial fibrillation,  Atypical chest pain,  catheterization in 2007 showing nonobstructive CAD,  Previous CT coronary calcium score in 2010 score of 68, predominately in the circumflex vessel Non smoker  PVCs, on magnesium Presenting for routine followup of his atrial fibrillation, orthostasis , chest pain symptoms.  Reports feeling relatively well Continues to report leg cramps, sweats, occasional atypical chest pain  Leg cramps, 1-2 x a month Not on a statin  Walks 2x a day 1 mile, with hills Or shortness of breath with hills  Chronic issue of periodic sweats in his creases of his arms, armpits happens more in the winter.   Runs house at 61 to 70 deg Does not feel he is overheated or overdressed  Denies any significant tachycardia concerning for atrial fibrillation  Atypical chest pain, left chest Better for most part  BP range at  Home 120 to 140s On avg 130 to 140  Pulse 55 to 60  Labs reviewed HBA1C 5.9 Total chol 153, LDL 95  He has diltiazem for breakthrough atrial fibrillation  Seen in the emergency room August 2019 for atypical chest pain  previous pain in his back, running down his legs, chronic right knee pain  EKG personally reviewed by myself on todays visit Shows sinus bradycardia rate 62 bpm  no significant ST or T wave changes  Other past medical history reviewed 06/2009, Results reviewed with him Coronary Calcium Score: 68.2 (61.7 CFX, 6.5 LAD).   32nd percentile which is low risk overall.  Event monitor in April 2017 showing PVCs Stress test July 2016 showing no  ischemia Echocardiogram July 2016 showing normal LV function  Previously had significant GI workup for discomfort, most of which has been negative. This included an EGD, abdominal ultrasound. He does take Tylenol and gabapentin.   PMH:   has a past medical history of Atypical chest pain, Cancer ([place]), Chronic anticoagulation, Chronic anxiety, Diverticulosis, GERD (gastroesophageal reflux disease), Hypertension, IBS (irritable bowel syndrome), Nonischemic cardiomyopathy ([place]), Paralyzed vocal cords, Paroxysmal atrial fibrillation ([place]), and Proteinuria.  PSH:    Past Surgical History:  Procedure Laterality Date  . CARDIAC CATHETERIZATION    . cataract surgery Bilateral   . MASS BIOPSY  2013    Current Outpatient Medications  Medication Sig Dispense Refill  . acidophilus (RISAQUAD) CAPS capsule Take 1 capsule by mouth daily.    . Cholecalciferol (VITAMIN D3) 400 UNITS CAPS Take 800 Units by mouth daily.     . Cobalamine Combinations (VITAMIN B12-FOLIC ACID) XX123456 MCG TABS Take 1 tablet by mouth daily.    . Coenzyme Q10 100 MG capsule Take 1 capsule by mouth 2 (two) times daily.     . Cranberry 500 MG CAPS Take 500 mg by mouth daily.    . diclofenac sodium (VOLTAREN) 1 % GEL Apply 2-4 g topically every 6 (six) hours as needed.     . diltiazem (CARDIZEM) 30 MG tablet Take 1 tablet (30 mg) by mouth once daily at bedtime 30 tablet 11  . finasteride (PROSCAR) 5 MG tablet Take 1 tablet (5 mg total)  by mouth daily. 30 tablet 11  . Flaxseed, Linseed, (FLAX SEED OIL) 1000 MG CAPS Take 1 capsule by mouth daily.      . flecainide (TAMBOCOR) 50 MG tablet Take 2 tablets (100 mg) by mouth twice daily as needed for recurrent atrial fibrillation 12 tablet 3  . gabapentin (NEURONTIN) 100 MG capsule Take 200 mg by mouth at bedtime.    . hydrocortisone (ANUSOL-HC) 25 MG suppository Place 25 mg rectally daily as needed for hemorrhoids or anal itching.    . magnesium oxide (MAG-OX) 400 MG tablet TAKE 1  TABLET BY MOUTH TWICE A DAY 180 tablet 0  . mirabegron ER (MYRBETRIQ) 25 MG TB24 tablet Take 1 tablet (25 mg total) by mouth daily. 30 tablet 11  . Multiple Vitamin (MULTIVITAMIN) tablet Take 1 tablet by mouth daily.      . nitroGLYCERIN (NITROSTAT) 0.4 MG SL tablet PLACE 1 TABLET (0.4 MG TOTAL) UNDER THE TONGUE EVERY 5 (FIVE) MINUTES AS NEEDED FOR CHEST PAIN. 90 tablet 0  . Omega-3 Fatty Acids (FISH OIL) 1000 MG CAPS Take 1 capsule by mouth daily.      . pantoprazole (PROTONIX) 40 MG tablet Take 40 mg by mouth daily.     . polyethylene glycol (MIRALAX / GLYCOLAX) packet Take 17 g by mouth daily as needed for mild constipation or moderate constipation.    . saw palmetto 160 MG capsule Take 160 mg by mouth 2 (two) times daily.    . sucralfate (CARAFATE) 1 g tablet Take 1 g 2 (two) times daily by mouth.  3  . tamsulosin (FLOMAX) 0.4 MG CAPS capsule Take 1 capsule (0.4 mg total) by mouth daily after supper. 180 capsule 2  . traMADol (ULTRAM) 50 MG tablet Take 50 mg by mouth every 6 (six) hours as needed for moderate pain.    . valsartan (DIOVAN) 40 MG tablet Take 20 mg by mouth daily as needed.  90 tablet 3  . warfarin (COUMADIN) 2.5 MG tablet Take 2.5 mg by mouth every evening.      No current facility-administered medications for this visit.     Allergies:   Ciprofloxacin, Lanoxin [digoxin], Lopressor [metoprolol tartrate], Sulfonamide derivatives, and Verapamil   Social History:  The patient  reports that he has never smoked. He has never used smokeless tobacco. He reports that he does not drink alcohol or use drugs.   Family History:   family history is not on file.    Review of Systems: Review of Systems  Constitutional: Negative.        Sweats  HENT: Negative.   Respiratory: Negative.   Cardiovascular: Negative.   Gastrointestinal: Negative.   Musculoskeletal: Negative.        Leg pain  Neurological: Negative.   Psychiatric/Behavioral: Negative.   All other systems reviewed  and are negative.   PHYSICAL EXAM: VS:  BP (!) 150/72 (BP Location: Left Arm, Patient Position: Sitting, Cuff Size: Normal)   Pulse 62   Ht 5\' 9"  (1.753 m)   Wt 196 lb 4 oz (89 kg)   SpO2 96%   BMI 28.98 kg/m  , BMI Body mass index is 28.98 kg/m.  Constitutional:  oriented to person, place, and time. No distress.  HENT:  Head: Grossly normal Eyes:  no discharge. No scleral icterus.  Neck: No JVD, no carotid bruits  Cardiovascular: Regular rate and rhythm, no murmurs appreciated Pulmonary/Chest: Clear to auscultation bilaterally, no wheezes or rails Abdominal: Soft.  no distension.  no tenderness.  Musculoskeletal: Normal range of motion Neurological:  normal muscle tone. Coordination normal. No atrophy Skin: Skin warm and dry Psychiatric: normal affect, pleasant   Recent Labs: 07/06/2019: Hemoglobin 14.0; Platelets 272 07/10/2019: BUN 12; Creatinine, Ser 0.85; Potassium 4.6; Sodium 130    Lipid Panel No results found for: CHOL, HDL, LDLCALC, TRIG    Wt Readings from Last 3 Encounters:  01/01/20 196 lb 4 oz (89 kg)  12/28/19 190 lb (86.2 kg)  11/09/19 192 lb 8 oz (87.3 kg)     ASSESSMENT AND PLAN:  Paroxysmal atrial fibrillation (HCC) -  Maintaining normal sinus rhythm, tolerating flecainide 50 twice daily On warfarin  Essential hypertension  Denies orthostasis symptoms BP 120 to 140 at home  Chest heaviness - atypical symptoms, Previous stress test early 2019.   musculoskeletal  Gastroesophageal reflux disease without esophagitis Symptoms better on PPI, Carafate, H2 blocker Stable sx  Anxiety Better, continue walking  Hyperlipidemia Not on a statin, has cramps at baseline   Total encounter time more than 25 minutes  Greater than 50% was spent in counseling and coordination of care with the patient  Disposition:   F/U  12 months    No orders of the defined types were placed in this encounter.    Signed, Esmond Plants, M.D., Ph.D. 01/01/2020   [place], [place]

## 2020-01-01 ENCOUNTER — Ambulatory Visit (INDEPENDENT_AMBULATORY_CARE_PROVIDER_SITE_OTHER): Payer: Medicare Other | Admitting: Cardiovascular Disease

## 2020-01-01 ENCOUNTER — Other Ambulatory Visit: Payer: Self-pay

## 2020-01-01 ENCOUNTER — Encounter: Payer: Self-pay | Admitting: Cardiovascular Disease

## 2020-01-01 VITALS — BP 118/60 | HR 62 | Ht 69.0 in | Wt 196.2 lb

## 2020-01-01 DIAGNOSIS — R0600 Dyspnea, unspecified: Secondary | ICD-10-CM

## 2020-01-01 DIAGNOSIS — I493 Ventricular premature depolarization: Secondary | ICD-10-CM

## 2020-01-01 DIAGNOSIS — F419 Anxiety disorder, unspecified: Secondary | ICD-10-CM

## 2020-01-01 DIAGNOSIS — I480 Paroxysmal atrial fibrillation: Secondary | ICD-10-CM

## 2020-01-01 DIAGNOSIS — I10 Essential (primary) hypertension: Secondary | ICD-10-CM

## 2020-01-01 DIAGNOSIS — R0789 Other chest pain: Secondary | ICD-10-CM

## 2020-01-01 DIAGNOSIS — I2510 Atherosclerotic heart disease of native coronary artery without angina pectoris: Secondary | ICD-10-CM

## 2020-01-01 DIAGNOSIS — R0609 Other forms of dyspnea: Secondary | ICD-10-CM

## 2020-01-01 DIAGNOSIS — R001 Bradycardia, unspecified: Secondary | ICD-10-CM

## 2020-01-01 NOTE — Patient Instructions (Signed)

## 2020-03-04 ENCOUNTER — Other Ambulatory Visit: Payer: Self-pay | Admitting: Cardiovascular Disease

## 2020-03-04 NOTE — Telephone Encounter (Signed)
Please advise if ok to refill Magnesium 400 mg tablet bid. 

## 2020-03-16 ENCOUNTER — Emergency Department: Payer: Medicare Other

## 2020-03-16 ENCOUNTER — Inpatient Hospital Stay
Admission: EM | Admit: 2020-03-16 | Discharge: 2020-03-19 | DRG: 522 | Disposition: A | Discharge status: Home Health | Payer: Medicare Other | Attending: Internal Medicine | Admitting: Internal Medicine

## 2020-03-16 ENCOUNTER — Encounter: Payer: Self-pay | Admitting: Emergency Medicine

## 2020-03-16 ENCOUNTER — Other Ambulatory Visit: Payer: Self-pay

## 2020-03-16 DIAGNOSIS — S72011A Unspecified intracapsular fracture of right femur, initial encounter for closed fracture: Secondary | ICD-10-CM

## 2020-03-16 DIAGNOSIS — S72001A Fracture of unspecified part of neck of right femur, initial encounter for closed fracture: Secondary | ICD-10-CM

## 2020-03-16 DIAGNOSIS — I10 Essential (primary) hypertension: Secondary | ICD-10-CM | POA: Diagnosis present

## 2020-03-16 DIAGNOSIS — K589 Irritable bowel syndrome without diarrhea: Secondary | ICD-10-CM | POA: Diagnosis present

## 2020-03-16 DIAGNOSIS — K5790 Diverticulosis of intestine, part unspecified, without perforation or abscess without bleeding: Secondary | ICD-10-CM | POA: Diagnosis present

## 2020-03-16 DIAGNOSIS — I482 Chronic atrial fibrillation, unspecified: Secondary | ICD-10-CM | POA: Diagnosis present

## 2020-03-16 DIAGNOSIS — Z881 Allergy status to other antibiotic agents status: Secondary | ICD-10-CM

## 2020-03-16 DIAGNOSIS — I428 Other cardiomyopathies: Secondary | ICD-10-CM | POA: Diagnosis present

## 2020-03-16 DIAGNOSIS — Z7901 Long term (current) use of anticoagulants: Secondary | ICD-10-CM | POA: Diagnosis not present

## 2020-03-16 DIAGNOSIS — Y92015 Private garage of single-family (private) house as the place of occurrence of the external cause: Secondary | ICD-10-CM

## 2020-03-16 DIAGNOSIS — F419 Anxiety disorder, unspecified: Secondary | ICD-10-CM | POA: Diagnosis present

## 2020-03-16 DIAGNOSIS — N401 Enlarged prostate with lower urinary tract symptoms: Secondary | ICD-10-CM | POA: Diagnosis present

## 2020-03-16 DIAGNOSIS — E785 Hyperlipidemia, unspecified: Secondary | ICD-10-CM | POA: Diagnosis present

## 2020-03-16 DIAGNOSIS — D62 Acute posthemorrhagic anemia: Secondary | ICD-10-CM | POA: Diagnosis not present

## 2020-03-16 DIAGNOSIS — K219 Gastro-esophageal reflux disease without esophagitis: Secondary | ICD-10-CM | POA: Diagnosis present

## 2020-03-16 DIAGNOSIS — M5136 Other intervertebral disc degeneration, lumbar region: Secondary | ICD-10-CM | POA: Diagnosis present

## 2020-03-16 DIAGNOSIS — I480 Paroxysmal atrial fibrillation: Secondary | ICD-10-CM | POA: Diagnosis present

## 2020-03-16 DIAGNOSIS — S72141A Displaced intertrochanteric fracture of right femur, initial encounter for closed fracture: Principal | ICD-10-CM | POA: Diagnosis present

## 2020-03-16 DIAGNOSIS — Z888 Allergy status to other drugs, medicaments and biological substances status: Secondary | ICD-10-CM

## 2020-03-16 DIAGNOSIS — Z882 Allergy status to sulfonamides status: Secondary | ICD-10-CM | POA: Diagnosis not present

## 2020-03-16 DIAGNOSIS — Z79899 Other long term (current) drug therapy: Secondary | ICD-10-CM

## 2020-03-16 DIAGNOSIS — Z20822 Contact with and (suspected) exposure to covid-19: Secondary | ICD-10-CM | POA: Diagnosis present

## 2020-03-16 DIAGNOSIS — I2510 Atherosclerotic heart disease of native coronary artery without angina pectoris: Secondary | ICD-10-CM | POA: Diagnosis present

## 2020-03-16 DIAGNOSIS — W010XXA Fall on same level from slipping, tripping and stumbling without subsequent striking against object, initial encounter: Secondary | ICD-10-CM | POA: Diagnosis present

## 2020-03-16 DIAGNOSIS — S7291XA Unspecified fracture of right femur, initial encounter for closed fracture: Secondary | ICD-10-CM

## 2020-03-16 DIAGNOSIS — N3281 Overactive bladder: Secondary | ICD-10-CM | POA: Diagnosis present

## 2020-03-16 DIAGNOSIS — Z419 Encounter for procedure for purposes other than remedying health state, unspecified: Secondary | ICD-10-CM

## 2020-03-16 DIAGNOSIS — Z96649 Presence of unspecified artificial hip joint: Secondary | ICD-10-CM

## 2020-03-16 HISTORY — DX: Cardiac arrhythmia, unspecified: I49.9

## 2020-03-16 LAB — CBC WITH DIFFERENTIAL/PLATELET
Abs Immature Granulocytes: 0.03 10*3/uL (ref 0.00–0.07)
Basophils Absolute: 0 10*3/uL (ref 0.0–0.1)
Basophils Relative: 0 %
Eosinophils Absolute: 0 10*3/uL (ref 0.0–0.5)
Eosinophils Relative: 1 %
HCT: 39 % (ref 39.0–52.0)
Hemoglobin: 13.1 g/dL (ref 13.0–17.0)
Immature Granulocytes: 1 %
Lymphocytes Relative: 27 %
Lymphs Abs: 1.7 10*3/uL (ref 0.7–4.0)
MCH: 27.3 pg (ref 26.0–34.0)
MCHC: 33.6 g/dL (ref 30.0–36.0)
MCV: 81.4 fL (ref 80.0–100.0)
Monocytes Absolute: 0.5 10*3/uL (ref 0.1–1.0)
Monocytes Relative: 7 %
Neutro Abs: 4.3 10*3/uL (ref 1.7–7.7)
Neutrophils Relative %: 64 %
Platelets: 202 10*3/uL (ref 150–400)
RBC: 4.79 MIL/uL (ref 4.22–5.81)
RDW: 15.4 % (ref 11.5–15.5)
WBC: 6.6 10*3/uL (ref 4.0–10.5)
nRBC: 0 % (ref 0.0–0.2)

## 2020-03-16 LAB — COMPREHENSIVE METABOLIC PANEL
ALT: 13 U/L (ref 0–44)
AST: 20 U/L (ref 15–41)
Albumin: 3.8 g/dL (ref 3.5–5.0)
Alkaline Phosphatase: 59 U/L (ref 38–126)
Anion gap: 7 (ref 5–15)
BUN: 16 mg/dL (ref 8–23)
CO2: 24 mmol/L (ref 22–32)
Calcium: 8.7 mg/dL — ABNORMAL LOW (ref 8.9–10.3)
Chloride: 105 mmol/L (ref 98–111)
Creatinine, Ser: 1.02 mg/dL (ref 0.61–1.24)
GFR calc Af Amer: >60 mL/min (ref >60)
GFR calc non Af Amer: >60 mL/min (ref >60)
Glucose, Bld: 123 mg/dL — ABNORMAL HIGH (ref 70–99)
Potassium: 4.3 mmol/L (ref 3.5–5.1)
Sodium: 136 mmol/L (ref 135–145)
Total Bilirubin: 0.6 mg/dL (ref 0.3–1.2)
Total Protein: 6.7 g/dL (ref 6.5–8.1)

## 2020-03-16 LAB — RESPIRATORY PANEL BY RT PCR (FLU A&B, COVID)
Influenza A by PCR: NEGATIVE
Influenza B by PCR: NEGATIVE
SARS Coronavirus 2 by RT PCR: NEGATIVE

## 2020-03-16 LAB — PROTIME-INR
INR: 2.4 — ABNORMAL HIGH (ref 0.8–1.2)
Prothrombin Time: 26 seconds — ABNORMAL HIGH (ref 11.4–15.2)

## 2020-03-16 LAB — APTT: aPTT: 44 seconds — ABNORMAL HIGH (ref 24–36)

## 2020-03-16 MED ORDER — TRAMADOL HCL 50 MG PO TABS
50.0000 mg | ORAL_TABLET | Freq: Four times a day (QID) | ORAL | Status: DC | PRN
  Administered 2020-03-16 – 2020-03-17 (×2): 50 mg via ORAL
  Filled 2020-03-16 (×2): qty 1

## 2020-03-16 MED ORDER — ONDANSETRON 4 MG PO TBDP
4.0000 mg | ORAL_TABLET | Freq: Once | ORAL | Status: AC
  Administered 2020-03-16: 4 mg via ORAL
  Filled 2020-03-16: qty 1

## 2020-03-16 MED ORDER — SODIUM CHLORIDE 0.9 % IV SOLN: INTRAVENOUS | Status: DC

## 2020-03-16 MED ORDER — ONDANSETRON HCL 4 MG/2ML IJ SOLN: 4.0000 mg | Freq: Four times a day (QID) | INTRAMUSCULAR | Status: DC | PRN

## 2020-03-16 MED ORDER — VITAMIN K1 10 MG/ML IJ SOLN
5.0000 mg | Freq: Once | INTRAVENOUS | Status: AC
  Administered 2020-03-16: 5 mg via INTRAVENOUS
  Filled 2020-03-16: qty 0.5

## 2020-03-16 MED ORDER — OXYCODONE-ACETAMINOPHEN 5-325 MG PO TABS
1.0000 | ORAL_TABLET | Freq: Once | ORAL | Status: AC
  Administered 2020-03-16: 1 via ORAL
  Filled 2020-03-16: qty 1

## 2020-03-16 MED ORDER — ONDANSETRON HCL 4 MG PO TABS
4.0000 mg | ORAL_TABLET | Freq: Four times a day (QID) | ORAL | Status: DC | PRN
  Administered 2020-03-17: 4 mg via ORAL
  Filled 2020-03-16: qty 1

## 2020-03-16 NOTE — ED Notes (Signed)
Pt states having pain to his right hip radiating to his leg following a fall. Pt denies dizziness or previous falls.

## 2020-03-16 NOTE — Anesthesia Preprocedure Evaluation (Deleted)
Anesthesia Evaluation    Airway        Dental   Pulmonary           Cardiovascular hypertension,      Neuro/Psych    GI/Hepatic   Endo/Other    Renal/GU      Musculoskeletal   Abdominal   Peds  Hematology   Anesthesia Other Findings Past Medical History: No date: Atypical chest pain     Comment:  a. Nonobstructive, very minor irregs by cath 2007;  b.               06/2009 Ex MV: small area of apical lateral ischemia;  c.               06/2009 Cath: essentially normal;  d. 04/2014 Lexiscan MV:               EF 59%, no ischemia/infarct->low risk. No date: Cancer ([place]) No date: Chronic anticoagulation     Comment:  a. Chronic Coumadin No date: Chronic anxiety No date: Diverticulosis No date: GERD (gastroesophageal reflux disease) No date: Hypertension No date: IBS (irritable bowel syndrome) No date: Nonischemic cardiomyopathy ([place])     Comment:  a. EF 45% by echo 2010;  b. 05/2012 Echo: EF 55-65%, no               rwma, Gr 1 DD, mild AI/MR, mildly dil LA. No date: Paralyzed vocal cords No date: Paroxysmal atrial fibrillation (HCC)     Comment:  a. Treated with flecainide No date: Proteinuria     Comment:  History of   Reproductive/Obstetrics                             Anesthesia Physical Anesthesia Plan Anesthesia Quick Evaluation

## 2020-03-16 NOTE — ED Triage Notes (Signed)
Pt arrival via [facility] from home due to fall. Pt was in the garage when he tripped on something and fell. Pt complains of right hip pain and has shortening of his right leg.  Pt denies dizziness/lightheadedness.  VS with EMS: -BP 194/90 HR 96 O2 98%

## 2020-03-16 NOTE — Progress Notes (Signed)
Full consult note and discussion with patient to follow tomorrow AM.  Called by ED staff. Imaging reviewed.  - Plan for surgery tomorrow morning for R hip hemiarthroplasty.  - NPO after midnight - Hold anticoagulation - Admit to Hospitalist team.

## 2020-03-16 NOTE — ED Provider Notes (Signed)
Emergency Department Provider Note  ____________________________________________  Time seen: Approximately 8:07 PM  I have reviewed the triage vital signs and the nursing notes.   HISTORY  Chief Complaint Hip Pain and Fall   Historian Patient     HPI Robert Burch is a 86 y.o. male with a history of hypertension and atrial fibrillation, presents to the emergency department with acute right hip pain.  Patient had a mechanical, nonsyncopal fall in his garage.  Patient states that he tripped on a mat that was in the garage.  He denies any his head or his neck.  He has been unable to bear weight since injury occurred.  He denies chest pain, chest tightness or abdominal pain.  No numbness or tingling in the upper and lower extremities.  He is using blood thinners.    Past Medical History:  Diagnosis Date  . Atypical chest pain    a. Nonobstructive, very minor irregs by cath 2007;  b. 06/2009 Ex MV: small area of apical lateral ischemia;  c. 06/2009 Cath: essentially normal;  d. 04/2014 Lexiscan MV: EF 59%, no ischemia/infarct->low risk.  . Cancer ([place])   . Chronic anticoagulation    a. Chronic Coumadin  . Chronic anxiety   . Diverticulosis   . GERD (gastroesophageal reflux disease)   . Hypertension   . IBS (irritable bowel syndrome)   . Nonischemic cardiomyopathy ([place])    a. EF 45% by echo 2010;  b. 05/2012 Echo: EF 55-65%, no rwma, Gr 1 DD, mild AI/MR, mildly dil LA.  . Paralyzed vocal cords   . Paroxysmal atrial fibrillation ([place])    a. Treated with flecainide  . Proteinuria    History of     Immunizations up to date:  Yes.     Past Medical History:  Diagnosis Date  . Atypical chest pain    a. Nonobstructive, very minor irregs by cath 2007;  b. 06/2009 Ex MV: small area of apical lateral ischemia;  c. 06/2009 Cath: essentially normal;  d. 04/2014 Lexiscan MV: EF 59%, no ischemia/infarct->low risk.  . Cancer ([place])   . Chronic anticoagulation    a. Chronic  Coumadin  . Chronic anxiety   . Diverticulosis   . GERD (gastroesophageal reflux disease)   . Hypertension   . IBS (irritable bowel syndrome)   . Nonischemic cardiomyopathy ([place])    a. EF 45% by echo 2010;  b. 05/2012 Echo: EF 55-65%, no rwma, Gr 1 DD, mild AI/MR, mildly dil LA.  . Paralyzed vocal cords   . Paroxysmal atrial fibrillation ([place])    a. Treated with flecainide  . Proteinuria    History of    Patient Active Problem List   Diagnosis Date Noted  . Closed right femoral fracture ([place]) 03/16/2020  . PAC (premature atrial contraction) 07/06/2019  . PVC (premature ventricular contraction) 07/06/2019  . Anemia 10/12/2018  . Sinus bradycardia 09/23/2018  . Coronary artery calcification seen on CAT scan 11/06/2017  . Diverticulum of bladder 11/26/2015  . Gross hematuria 10/29/2015  . Hyperlipidemia 04/22/2015  . Cervical radiculitis 05/22/2014  . DDD (degenerative disc disease), lumbar 05/22/2014  . Lumbar stenosis with neurogenic claudication 05/22/2014  . Paroxysmal atrial fibrillation ([facility])   . Atypical chest pain   . GERD (gastroesophageal reflux disease)   . Elevated prostate specific antigen (PSA) 12/06/2013  . Anxiety 10/17/2013  . Benign localized hyperplasia of prostate with urinary obstruction 08/31/2013  . Chronic prostatitis 08/31/2013  . ED (erectile dysfunction) of organic origin 08/31/2013  .  Incomplete emptying of bladder 08/31/2013  . Syncope and collapse 12/07/2011  . Snoring 07/02/2011  . Abnormal respiratory rate 07/02/2011  . Essential hypertension 07/22/2009    Past Surgical History:  Procedure Laterality Date  . CARDIAC CATHETERIZATION    . cataract surgery Bilateral   . MASS BIOPSY  2013    Prior to Admission medications   Medication Sig Start Date End Date Taking? Authorizing Provider  acidophilus (RISAQUAD) CAPS capsule Take 1 capsule by mouth daily.   Yes [provider]  Cholecalciferol (VITAMIN D3) 400 UNITS CAPS Take 800  Units by mouth daily.    Yes [provider]  Cobalamine Combinations (VITAMIN B12-FOLIC ACID) XX123456 MCG TABS Take 1 tablet by mouth daily.   Yes [provider]  Coenzyme Q10 50 MG CAPS Take 50 mg by mouth 2 (two) times daily.    Yes [provider]  Cranberry 500 MG CAPS Take 500 mg by mouth daily.   Yes [provider]  diclofenac sodium (VOLTAREN) 1 % GEL Apply 2-4 g topically every 6 (six) hours as needed.  09/23/17  Yes [provider]  diltiazem (CARDIZEM) 30 MG tablet Take 1 tablet (30 mg) by mouth once daily at bedtime 11/09/19  Yes Deboraha Sprang, MD  finasteride (PROSCAR) 5 MG tablet Take 1 tablet (5 mg total) by mouth daily. 12/28/19  Yes Billey Co, MD  Flaxseed, Linseed, (FLAX SEED OIL) 1000 MG CAPS Take 1,000 mg by mouth daily.    Yes [provider]  flecainide (TAMBOCOR) 50 MG tablet Take 2 tablets (100 mg) by mouth twice daily as needed for recurrent atrial fibrillation 07/06/19  Yes Deboraha Sprang, MD  Ginger 500 MG CAPS Take 500 mg by mouth 2 (two) times daily.   Yes [provider]  hydrocortisone (ANUSOL-HC) 25 MG suppository Place 25 mg rectally daily as needed for hemorrhoids or anal itching.   Yes [provider]  magnesium oxide (MAG-OX) 400 MG tablet TAKE 1 TABLET BY MOUTH TWICE A DAY Patient taking differently: Take 400 mg by mouth 2 (two) times daily.  03/04/20  Yes [name], Kathlene November, MD  mirabegron ER (MYRBETRIQ) 25 MG TB24 tablet Take 1 tablet (25 mg total) by mouth daily. Patient taking differently: Take 25 mg by mouth at bedtime.  11/01/19  Yes Billey Co, MD  Multiple Vitamin (MULTIVITAMIN) tablet Take 1 tablet by mouth daily.     Yes [provider]  nitroGLYCERIN (NITROSTAT) 0.4 MG SL tablet PLACE 1 TABLET (0.4 MG TOTAL) UNDER THE TONGUE EVERY 5 (FIVE) MINUTES AS NEEDED FOR CHEST PAIN. 09/30/18  Yes Deboraha Sprang, MD  Omega-3 Fatty Acids (FISH OIL) 1000 MG CAPS Take 1,000  mg by mouth daily.    Yes [provider]  pantoprazole (PROTONIX) 20 MG tablet Take 20 mg by mouth daily.    Yes [provider]  polyethylene glycol (MIRALAX / GLYCOLAX) packet Take 17 g by mouth daily as needed for mild constipation or moderate constipation.   Yes [provider]  saw palmetto 160 MG capsule Take 160 mg by mouth 2 (two) times daily.   Yes [provider]  tamsulosin (FLOMAX) 0.4 MG CAPS capsule Take 1 capsule (0.4 mg total) by mouth daily after supper. Patient taking differently: Take 0.4 mg by mouth 2 (two) times daily.  12/28/19  Yes Billey Co, MD  traMADol (ULTRAM) 50 MG tablet Take 50 mg by mouth every 6 (six) hours as needed  for moderate pain.   Yes [provider]  warfarin (COUMADIN) 2.5 MG tablet Take 2.5 mg by mouth every evening.    Yes [provider]    Allergies Ciprofloxacin, Lanoxin [digoxin], Lopressor [metoprolol tartrate], Sulfonamide derivatives, and Verapamil  Family History  Problem Relation Age of Onset  . Heart attack Neg Hx     Social History Social History   Tobacco Use  . Smoking status: Never Smoker  . Smokeless tobacco: Never Used  Substance Use Topics  . Alcohol use: No    Alcohol/week: 2.0 standard drinks    Types: 1 Glasses of wine, 1 Standard drinks or equivalent per week    Comment: occassionally  . Drug use: No     Review of Systems  Constitutional: No fever/chills Eyes:  No discharge ENT: No upper respiratory complaints. Respiratory: no cough. No SOB/ use of accessory muscles to breath Gastrointestinal:   No nausea, no vomiting.  No diarrhea.  No constipation. Musculoskeletal: Patient has right hip pain.  Skin: Negative for rash, abrasions, lacerations, ecchymosis.    ____________________________________________   PHYSICAL EXAM:  VITAL SIGNS: ED Triage Vitals  Enc Vitals Group     BP 03/16/20 1558 (!) 188/109     Pulse Rate 03/16/20 1558 68     Resp  03/16/20 1558 18     Temp 03/16/20 1558 98.5 F (36.9 C)     Temp Source 03/16/20 1558 Oral     SpO2 03/16/20 1558 95 %     Weight 03/16/20 1600 192 lb (87.1 kg)     Height 03/16/20 1600 5\' 10"  (1.778 m)     Head Circumference --      Peak Flow --      Pain Score 03/16/20 1600 9     Pain Loc --      Pain Edu? --      Excl. in [place]? --      Constitutional: Alert and oriented. Well appearing and in no acute distress. Eyes: Conjunctivae are normal. PERRL. EOMI. Head: Atraumatic. Cardiovascular: Normal rate, regular rhythm. Normal S1 and S2.  Good peripheral circulation. Respiratory: Normal respiratory effort without tachypnea or retractions. Lungs CTAB. Good air entry to the bases with no decreased or absent breath sounds Gastrointestinal: Bowel sounds x 4 quadrants. Soft and nontender to palpation. No guarding or rigidity. No distention. Musculoskeletal: Patient has pain with attempted internal and external rotation of the right hip.  Palpable dorsalis pedis pulse, right. Neurologic:  Normal for age. No gross focal neurologic deficits are appreciated.  Skin:  Skin is warm, dry and intact. No rash noted. Psychiatric: Mood and affect are normal for age. Speech and behavior are normal.   ____________________________________________   LABS (all labs ordered are listed, but only abnormal results are displayed)  Labs Reviewed  COMPREHENSIVE METABOLIC PANEL - Abnormal; Notable for the following components:      Result Value   Glucose, Bld 123 (*)    Calcium 8.7 (*)    All other components within normal limits  PROTIME-INR - Abnormal; Notable for the following components:   Prothrombin Time 26.0 (*)    INR 2.4 (*)    All other components within normal limits  APTT - Abnormal; Notable for the following components:   aPTT 44 (*)    All other components within normal limits  RESPIRATORY PANEL BY RT PCR (FLU A&B, COVID)  CBC WITH DIFFERENTIAL/PLATELET    ____________________________________________  EKG   ____________________________________________  RADIOLOGY I, Lannie Fields,  personally viewed and evaluated these images (plain radiographs) as part of my medical decision making, as well as reviewing the written report by the radiologist.  DG Chest 1 View  Result Date: 03/16/2020 CLINICAL DATA:  Hip fracture. EXAM: CHEST  1 VIEW COMPARISON:  August 08, 2018 FINDINGS: The heart size is mildly enlarged. There is no pneumothorax or large pleural effusion. No focal infiltrate. No acute osseous abnormality involving the visualized portions of the thorax. IMPRESSION: No active disease. Electronically Signed   By: Constance Holster M.D.   On: 03/16/2020 17:22   CT Head Wo Contrast  Result Date: 03/16/2020 CLINICAL DATA:  Pain status post fall EXAM: CT HEAD WITHOUT CONTRAST CT CERVICAL SPINE WITHOUT CONTRAST TECHNIQUE: Multidetector CT imaging of the head and cervical spine was performed following the standard protocol without intravenous contrast. Multiplanar CT image reconstructions of the cervical spine were also generated. COMPARISON:  MRI cervical spine dated May 31, 2012. CT head dated May 17, 2011. FINDINGS: CT HEAD FINDINGS Brain: No evidence of acute infarction, hemorrhage, hydrocephalus, extra-axial collection or mass lesion/mass effect. Atrophy and chronic microvascular ischemic changes are noted. Vascular: No hyperdense vessel or unexpected calcification. Skull: Normal. Negative for fracture or focal lesion. Sinuses/Orbits: There is a mucosal retention cyst in the left maxillary sinus. There is opacification of the left mastoid air cells. The remaining paranasal sinuses and mastoid air cells are essentially clear. Other: None. CT CERVICAL SPINE FINDINGS Alignment: Normal. Skull base and vertebrae: No acute fracture. No primary bone lesion or focal pathologic process. Soft tissues and spinal canal: No prevertebral fluid or swelling. No  visible canal hematoma. Disc levels: Multilevel disc height loss is noted throughout the cervical spine, greatest at the C6-C7 level. Multilevel osseous neural foraminal narrowing is noted bilaterally. Upper chest: Negative. Other: None IMPRESSION: 1. No acute intracranial abnormality. 2. No acute cervical spine fracture. Electronically Signed   By: Constance Holster M.D.   On: 03/16/2020 17:01   CT Cervical Spine Wo Contrast  Result Date: 03/16/2020 CLINICAL DATA:  Pain status post fall EXAM: CT HEAD WITHOUT CONTRAST CT CERVICAL SPINE WITHOUT CONTRAST TECHNIQUE: Multidetector CT imaging of the head and cervical spine was performed following the standard protocol without intravenous contrast. Multiplanar CT image reconstructions of the cervical spine were also generated. COMPARISON:  MRI cervical spine dated May 31, 2012. CT head dated May 17, 2011. FINDINGS: CT HEAD FINDINGS Brain: No evidence of acute infarction, hemorrhage, hydrocephalus, extra-axial collection or mass lesion/mass effect. Atrophy and chronic microvascular ischemic changes are noted. Vascular: No hyperdense vessel or unexpected calcification. Skull: Normal. Negative for fracture or focal lesion. Sinuses/Orbits: There is a mucosal retention cyst in the left maxillary sinus. There is opacification of the left mastoid air cells. The remaining paranasal sinuses and mastoid air cells are essentially clear. Other: None. CT CERVICAL SPINE FINDINGS Alignment: Normal. Skull base and vertebrae: No acute fracture. No primary bone lesion or focal pathologic process. Soft tissues and spinal canal: No prevertebral fluid or swelling. No visible canal hematoma. Disc levels: Multilevel disc height loss is noted throughout the cervical spine, greatest at the C6-C7 level. Multilevel osseous neural foraminal narrowing is noted bilaterally. Upper chest: Negative. Other: None IMPRESSION: 1. No acute intracranial abnormality. 2. No acute cervical spine fracture.  Electronically Signed   By: Constance Holster M.D.   On: 03/16/2020 17:01   DG Hip Unilat W or Wo Pelvis 2-3 Views Right  Result Date: 03/16/2020 CLINICAL DATA:  Pain status post fall EXAM: DG  HIP (WITH OR WITHOUT PELVIS) 2-3V RIGHT COMPARISON:  None. FINDINGS: There is an acute displaced transcervical fracture of the proximal right femur. There is no dislocation. There is osteopenia. There is mild-to-moderate osteoarthritis involving both hips. Multiple calcifications project over the patient's pelvis and are favored to represent phleboliths. IMPRESSION: 1. Acute displaced transcervical fracture of the proximal right femur. 2. 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 Electronically Signed   By: Constance Holster M.D.   On: 03/16/2020 17:19    ____________________________________________    PROCEDURES  Procedure(s) performed:     Procedures     Medications  oxyCODONE-acetaminophen (PERCOCET/ROXICET) 5-325 MG per tablet 1 tablet (1 tablet Oral Given 03/16/20 1843)  ondansetron (ZOFRAN-ODT) disintegrating tablet 4 mg (4 mg Oral Given 03/16/20 1843)     ____________________________________________   INITIAL IMPRESSION / ASSESSMENT AND PLAN / ED COURSE  Pertinent labs & imaging results that were available during my care of the patient were reviewed by me and considered in my medical decision making (see chart for details).  Clinical Course as of Mar 17 2011  Sat Mar 16, 2020  1730 Lymphocyte #: 1.7 [JW]    Clinical Course User Index [JW] Lannie Fields, PA-C   Assessment and plan Fall 86 year old male presents to the emergency department after a mechanical, nonsyncopal fall.  Patient was hypertensive at triage but vital signs were otherwise reassuring.  X-ray examination revealed a right hip fracture.  CTs of the head and neck revealed no acute abnormality.  Orthopedist on-call, Dr. Posey Pronto was consulted who anticipates taking patient to the OR  tomorrow.  Oxycodone was given in the emergency department for pain.  Basic labs and chest x-ray were obtained.  Hospitalist on-call, Dr. Jonelle Sidle accepted patient for admission.   ____________________________________________  FINAL CLINICAL IMPRESSION(S) / ED DIAGNOSES  Final diagnoses:  Closed fracture of right hip, initial encounter ([place])      NEW MEDICATIONS STARTED DURING THIS VISIT:  ED Discharge Orders    None          This chart was dictated using voice recognition software/Dragon. Despite best efforts to proofread, errors can occur which can change the meaning. Any change was purely unintentional.      Karren Cobble 03/16/20 2012    Nance Pear, MD 03/18/20 [phone]

## 2020-03-16 NOTE — ED Notes (Signed)
Report called. Pt given the room number to inform his wife.

## 2020-03-16 NOTE — H&P (Signed)
History and Physical   Robert Burch [id number] DOB: 06-29-1933 DOA: 03/16/2020  Referring MD/NP/PA: Lois Huxley, PA  PCP: [name], MD   Patient coming from: Home  Chief Complaint: Fall with Right   HPI: Robert Burch is a 86 y.o. male with medical history significant of coronary artery disease nonobstructive to chronic anticoagulation with Coumadin due to A. fib, atrial fibrillation, GERD, hypertension, anxiety disorder and diverticular disease who sustained a mechanical fall at home today when his shoes got up at the bottom of stairs he slipped and fell.  Patient's wife helped him to a wheelchair back into the ER home.  He realized right hip pain and was unable to get up.  He then realized that he may have broken his bone.  He was also having pain in his right knee.  Patient was seen in the ER evaluated and found to have right intertrochanteric fracture.  He is otherwise hemodynamically stable.  Dr. Posey Pronto the orthopedic surgeon was consulted and plans open reduction tomorrow.  Patient is being admitted to medical service for evaluation.  Patient did not hit his head.  He did not lose consciousness.  Do not have any other site of injury.  His pain is currently a 3 out of 10 after receiving pain medications in the ER..  ED Course: Temperature 98.5, blood pressure 180/109, pulse 77 respiratory 20 oxygen sat 95% room air.  White count 6.6 hemoglobin 13.0 platelet 203.  Sodium 136 potassium 4.3 chloride 105 CO2 24 BUN 16 creatinine 1.02 calcium 8.7.  INR is 2.4 glucose 123.  PTT 44.  COVID-19 is negative.  CT cervical spine and head CT without contrast were negative.  Chest x-ray showed no acute findings.  X-ray of the right hip shows acute displaced transcervical fracture of the proximal right femur with mild to moderate osteoarthritis involving both hips.  Orthopedics consulted and patient being admitted for treatment  Review of Systems: As per HPI otherwise 10 point  review of systems negative.    Past Medical History:  Diagnosis Date  . Atypical chest pain    a. Nonobstructive, very minor irregs by cath 2007;  b. 06/2009 Ex MV: small area of apical lateral ischemia;  c. 06/2009 Cath: essentially normal;  d. 04/2014 Lexiscan MV: EF 59%, no ischemia/infarct->low risk.  . Cancer ([place])   . Chronic anticoagulation    a. Chronic Coumadin  . Chronic anxiety   . Diverticulosis   . GERD (gastroesophageal reflux disease)   . Hypertension   . IBS (irritable bowel syndrome)   . Nonischemic cardiomyopathy ([place])    a. EF 45% by echo 2010;  b. 05/2012 Echo: EF 55-65%, no rwma, Gr 1 DD, mild AI/MR, mildly dil LA.  . Paralyzed vocal cords   . Paroxysmal atrial fibrillation ([place])    a. Treated with flecainide  . Proteinuria    History of    Past Surgical History:  Procedure Laterality Date  . CARDIAC CATHETERIZATION    . cataract surgery Bilateral   . MASS BIOPSY  2013     reports that he has never smoked. He has never used smokeless tobacco. He reports that he does not drink alcohol or use drugs.  Allergies  Allergen Reactions  . Ciprofloxacin     Diarrhea and stomach pains  . Lanoxin [Digoxin]   . Lopressor [Metoprolol Tartrate]   . Sulfonamide Derivatives     Rash   . Verapamil     Family History  Problem Relation Age  of Onset  . Heart attack Neg Hx      Prior to Admission medications   Medication Sig Start Date End Date Taking? Authorizing Provider  acidophilus (RISAQUAD) CAPS capsule Take 1 capsule by mouth daily.    [provider]  Cholecalciferol (VITAMIN D3) 400 UNITS CAPS Take 800 Units by mouth daily.     [provider]  Cobalamine Combinations (VITAMIN B12-FOLIC ACID) XX123456 MCG TABS Take 1 tablet by mouth daily.    [provider]  Coenzyme Q10 100 MG capsule Take 1 capsule by mouth 2 (two) times daily.     [provider]  Cranberry 500 MG CAPS Take 500 mg by mouth daily.    [provider]  diclofenac sodium (VOLTAREN) 1 % GEL Apply 2-4 g topically every 6 (six) hours as needed.  09/23/17   [provider]  diltiazem (CARDIZEM) 30 MG tablet Take 1 tablet (30 mg) by mouth once daily at bedtime 11/09/19   Deboraha Sprang, MD  finasteride (PROSCAR) 5 MG tablet Take 1 tablet (5 mg total) by mouth daily. 12/28/19   Billey Co, MD  Flaxseed, Linseed, (FLAX SEED OIL) 1000 MG CAPS Take 1 capsule by mouth daily.      [provider]  flecainide (TAMBOCOR) 50 MG tablet Take 2 tablets (100 mg) by mouth twice daily as needed for recurrent atrial fibrillation 07/06/19   Deboraha Sprang, MD  gabapentin (NEURONTIN) 100 MG capsule Take 200 mg by mouth at bedtime.    [provider]  hydrocortisone (ANUSOL-HC) 25 MG suppository Place 25 mg rectally daily as needed for hemorrhoids or anal itching.    [provider]  magnesium oxide (MAG-OX) 400 MG tablet TAKE 1 TABLET BY MOUTH TWICE A DAY 03/04/20   [name], Kathlene November, MD  mirabegron ER (MYRBETRIQ) 25 MG TB24 tablet Take 1 tablet (25 mg total) by mouth daily. 11/01/19   Billey Co, MD  Multiple Vitamin (MULTIVITAMIN) tablet Take 1 tablet by mouth daily.      [provider]  nitroGLYCERIN (NITROSTAT) 0.4 MG SL tablet PLACE 1 TABLET (0.4 MG TOTAL) UNDER THE TONGUE EVERY 5 (FIVE) MINUTES AS NEEDED FOR CHEST PAIN. 09/30/18   Deboraha Sprang, MD  Omega-3 Fatty Acids (FISH OIL) 1000 MG CAPS Take 1 capsule by mouth daily.      [provider]  pantoprazole (PROTONIX) 40 MG tablet Take 40 mg by mouth daily.     [provider]  polyethylene glycol (MIRALAX / GLYCOLAX) packet Take 17 g by mouth daily as needed for mild constipation or moderate constipation.    [provider]  saw palmetto 160 MG capsule Take 160 mg by mouth 2 (two) times daily.    [provider]  sucralfate (CARAFATE) 1 g tablet Take 1 g 2 (two) times daily by mouth. 10/17/17   [provider]  tamsulosin (FLOMAX) 0.4 MG CAPS capsule Take 1 capsule (0.4 mg total) by mouth daily after supper. 12/28/19   Billey Co, MD  traMADol (ULTRAM) 50 MG tablet Take 50 mg by mouth every 6 (six) hours as needed for moderate pain.    [provider]  valsartan (DIOVAN) 40 MG tablet Take 20 mg by mouth daily as needed.  09/06/18   Minna Merritts, MD  warfarin (COUMADIN) 2.5 MG tablet Take 2.5 mg by mouth every evening.     [provider]    Physical Exam: Vitals:  03/16/20 1558 03/16/20 1600 03/16/20 2008 03/16/20 2057  BP: (!) 188/109  (!) 170/82 (!) 156/88  Pulse: 68  77 76  Resp: 18  16 20   Temp: 98.5 F (36.9 C)   98 F (36.7 C)  TempSrc: Oral   Oral  SpO2: 95%  96% 96%  Weight:  87.1 kg  88.5 kg  Height:  5\' 10"  (1.778 m)        Constitutional: Acutely ill looking, no distress Vitals:   03/16/20 1558 03/16/20 1600 03/16/20 2008 03/16/20 2057  BP: (!) 188/109  (!) 170/82 (!) 156/88  Pulse: 68  77 76  Resp: 18  16 20   Temp: 98.5 F (36.9 C)   98 F (36.7 C)  TempSrc: Oral   Oral  SpO2: 95%  96% 96%  Weight:  87.1 kg  88.5 kg  Height:  5\' 10"  (1.778 m)     Eyes: PERRL, lids and conjunctivae normal ENMT: Mucous membranes are moist. Posterior pharynx clear of any exudate or lesions.Normal dentition.  Neck: normal, supple, no masses, no thyromegaly Respiratory: clear to auscultation bilaterally, no wheezing, no crackles. Normal respiratory effort. No accessory muscle use.  Cardiovascular: Irregularly irregular rate and rhythm, no murmurs / rubs / gallops. No extremity edema. 2+ pedal pulses. No carotid bruits.  Abdomen: no tenderness, no masses palpated. No hepatosplenomegaly. Bowel sounds positive.  Musculoskeletal: no clubbing / cyanosis.  Laterally rotated right hip, tenderness and mild shortening. Normal muscle tone.  Skin: no rashes, lesions, ulcers. No induration Neurologic: CN 2-12 grossly intact. Sensation intact, DTR normal.  Strength 5/5 in all 4.  Psychiatric: Normal judgment and insight. Alert and oriented x 3. Normal mood.     Labs on Admission: I have personally reviewed following labs and imaging studies  CBC: Recent Labs  Lab 03/16/20 1612  WBC 6.6  NEUTROABS 4.3  HGB 13.1  HCT 39.0  MCV 81.4  PLT 123XX123   Basic Metabolic Panel: Recent Labs  Lab 03/16/20 1612  NA 136  K 4.3  CL 105  CO2 24  GLUCOSE 123*  BUN 16  CREATININE 1.02  CALCIUM 8.7*   GFR: Estimated Creatinine Clearance: 60.4 mL/min (by C-G formula based on SCr of 1.02 mg/dL). Liver Function Tests: Recent Labs  Lab 03/16/20 1612  AST 20  ALT 13  ALKPHOS 59  BILITOT 0.6  PROT 6.7  ALBUMIN 3.8   No results for input(s): LIPASE, AMYLASE in the last 168 hours. No results for input(s): AMMONIA in the last 168 hours. Coagulation Profile: Recent Labs  Lab 03/16/20 1808  INR 2.4*   Cardiac Enzymes: No results for input(s): CKTOTAL, CKMB, CKMBINDEX, TROPONINI in the last 168 hours. BNP (last 3 results) No results for input(s): PROBNP in the last 8760 hours. HbA1C: No results for input(s): HGBA1C in the last 72 hours. CBG: No results for input(s): GLUCAP in the last 168 hours. Lipid Profile: No results for input(s): CHOL, HDL, LDLCALC, TRIG, CHOLHDL, LDLDIRECT in the last 72 hours. Thyroid Function Tests: No results for input(s): TSH, T4TOTAL, FREET4, T3FREE, THYROIDAB in the last 72 hours. Anemia Panel: No results for input(s): VITAMINB12, FOLATE, FERRITIN, TIBC, IRON, RETICCTPCT in the last 72 hours. Urine analysis:    Component Value Date/Time   APPEARANCEUR Clear 10/12/2018 1010   GLUCOSEU Negative 10/12/2018 1010   BILIRUBINUR Negative 10/12/2018 1010   PROTEINUR Negative 10/12/2018 1010   NITRITE Negative 10/12/2018 1010   LEUKOCYTESUR Negative 10/12/2018 1010   Sepsis Labs: @LABRCNTIP (procalcitonin:4,lacticidven:4) ) Recent Results (from  the past 240 hour(s))  Respiratory Panel by RT PCR (Flu A&B,  Covid) - Nasopharyngeal Swab     Status: None   Collection Time: 03/16/20  6:01 PM   Specimen: Nasopharyngeal Swab  Result Value Ref Range Status   SARS Coronavirus 2 by RT PCR NEGATIVE NEGATIVE Final    Comment: (NOTE) SARS-CoV-2 target nucleic acids are NOT DETECTED. The SARS-CoV-2 RNA is generally detectable in upper respiratoy specimens during the acute phase of infection. The lowest concentration of SARS-CoV-2 viral copies this assay can detect is 131 copies/mL. A negative result does not preclude SARS-Cov-2 infection and should not be used as the sole basis for treatment or other patient management decisions. A negative result may occur with  improper specimen collection/handling, submission of specimen other than nasopharyngeal swab, presence of viral mutation(s) within the areas targeted by this assay, and inadequate number of viral copies (<131 copies/mL). A negative result must be combined with clinical observations, patient history, and epidemiological information. The expected result is Negative. Fact Sheet for Patients:  PinkCheek.be Fact Sheet for Healthcare Providers:  GravelBags.it This test is not yet ap proved or cleared by the [place] FDA and  has been authorized for detection and/or diagnosis of SARS-CoV-2 by FDA under an Emergency Use Authorization (EUA). This EUA will remain  in effect (meaning this test can be used) for the duration of the COVID-19 declaration under Section 564(b)(1) of the Act, 21 U.S.C. section 360bbb-3(b)(1), unless the authorization is terminated or revoked sooner.    Influenza A by PCR NEGATIVE NEGATIVE Final   Influenza B by PCR NEGATIVE NEGATIVE Final    Comment: (NOTE) The Xpert Xpress SARS-CoV-2/FLU/RSV assay is intended as an aid in  the diagnosis of influenza from Nasopharyngeal swab specimens and  should not be used as a sole basis for treatment. Nasal washings and   aspirates are unacceptable for Xpert Xpress SARS-CoV-2/FLU/RSV  testing. Fact Sheet for Patients: PinkCheek.be Fact Sheet for Healthcare Providers: GravelBags.it This test is not yet approved or cleared by the [place] FDA and  has been authorized for detection and/or diagnosis of SARS-CoV-2 by  FDA under an Emergency Use Authorization (EUA). This EUA will remain  in effect (meaning this test can be used) for the duration of the  Covid-19 declaration under Section 564(b)(1) of the Act, 21  U.S.C. section 360bbb-3(b)(1), unless the authorization is  terminated or revoked. Performed at [facility], [address] [place], [place] [address]      Radiological Exams on Admission: DG Chest 1 View  Result Date: 03/16/2020 CLINICAL DATA:  Hip fracture. EXAM: CHEST  1 VIEW COMPARISON:  August 08, 2018 FINDINGS: The heart size is mildly enlarged. There is no pneumothorax or large pleural effusion. No focal infiltrate. No acute osseous abnormality involving the visualized portions of the thorax. IMPRESSION: No active disease. Electronically Signed   By: Constance Holster M.D.   On: 03/16/2020 17:22   CT Head Wo Contrast  Result Date: 03/16/2020 CLINICAL DATA:  Pain status post fall EXAM: CT HEAD WITHOUT CONTRAST CT CERVICAL SPINE WITHOUT CONTRAST TECHNIQUE: Multidetector CT imaging of the head and cervical spine was performed following the standard protocol without intravenous contrast. Multiplanar CT image reconstructions of the cervical spine were also generated. COMPARISON:  MRI cervical spine dated May 31, 2012. CT head dated May 17, 2011. FINDINGS: CT HEAD FINDINGS Brain: No evidence of acute infarction, hemorrhage, hydrocephalus, extra-axial collection or mass lesion/mass effect. Atrophy and chronic microvascular ischemic changes are  noted. Vascular: No hyperdense vessel or unexpected calcification. Skull: Normal.  Negative for fracture or focal lesion. Sinuses/Orbits: There is a mucosal retention cyst in the left maxillary sinus. There is opacification of the left mastoid air cells. The remaining paranasal sinuses and mastoid air cells are essentially clear. Other: None. CT CERVICAL SPINE FINDINGS Alignment: Normal. Skull base and vertebrae: No acute fracture. No primary bone lesion or focal pathologic process. Soft tissues and spinal canal: No prevertebral fluid or swelling. No visible canal hematoma. Disc levels: Multilevel disc height loss is noted throughout the cervical spine, greatest at the C6-C7 level. Multilevel osseous neural foraminal narrowing is noted bilaterally. Upper chest: Negative. Other: None IMPRESSION: 1. No acute intracranial abnormality. 2. No acute cervical spine fracture. Electronically Signed   By: Constance Holster M.D.   On: 03/16/2020 17:01   CT Cervical Spine Wo Contrast  Result Date: 03/16/2020 CLINICAL DATA:  Pain status post fall EXAM: CT HEAD WITHOUT CONTRAST CT CERVICAL SPINE WITHOUT CONTRAST TECHNIQUE: Multidetector CT imaging of the head and cervical spine was performed following the standard protocol without intravenous contrast. Multiplanar CT image reconstructions of the cervical spine were also generated. COMPARISON:  MRI cervical spine dated May 31, 2012. CT head dated May 17, 2011. FINDINGS: CT HEAD FINDINGS Brain: No evidence of acute infarction, hemorrhage, hydrocephalus, extra-axial collection or mass lesion/mass effect. Atrophy and chronic microvascular ischemic changes are noted. Vascular: No hyperdense vessel or unexpected calcification. Skull: Normal. Negative for fracture or focal lesion. Sinuses/Orbits: There is a mucosal retention cyst in the left maxillary sinus. There is opacification of the left mastoid air cells. The remaining paranasal sinuses and mastoid air cells are essentially clear. Other: None. CT CERVICAL SPINE FINDINGS Alignment: Normal. Skull base and  vertebrae: No acute fracture. No primary bone lesion or focal pathologic process. Soft tissues and spinal canal: No prevertebral fluid or swelling. No visible canal hematoma. Disc levels: Multilevel disc height loss is noted throughout the cervical spine, greatest at the C6-C7 level. Multilevel osseous neural foraminal narrowing is noted bilaterally. Upper chest: Negative. Other: None IMPRESSION: 1. No acute intracranial abnormality. 2. No acute cervical spine fracture. Electronically Signed   By: Constance Holster M.D.   On: 03/16/2020 17:01   DG Hip Unilat W or Wo Pelvis 2-3 Views Right  Result Date: 03/16/2020 CLINICAL DATA:  Pain status post fall EXAM: DG HIP (WITH OR WITHOUT PELVIS) 2-3V RIGHT COMPARISON:  None. FINDINGS: There is an acute displaced transcervical fracture of the proximal right femur. There is no dislocation. There is osteopenia. There is mild-to-moderate osteoarthritis involving both hips. Multiple calcifications project over the patient's pelvis and are favored to represent phleboliths. IMPRESSION: 1. Acute displaced transcervical fracture of the proximal right femur. 2. 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 Electronically Signed   By: Constance Holster M.D.   On: 03/16/2020 17:19      Assessment/Plan Principal Problem:   Closed right femoral fracture (HCC) Active Problems:   Essential hypertension   Paroxysmal atrial fibrillation (HCC)   GERD (gastroesophageal reflux disease)   Hyperlipidemia     #1 closed right femoral fracture: After mechanical fall.  Patient appears to be hemodynamically stable.  Pain is controlled now.  Medically ready for surgical repair tomorrow.  We will keep him n.p.o. after midnight.  Will need PT and OT consultation.  #2 essential hypertension: Continue home regimen.  #3 paroxysmal atrial fibrillation: Patient on warfarin at home.  INR was noted to be 2.4.  We will hold Coumadin.  Rate is controlled.  If INR is still elevated  tomorrow may require FFP or vitamin K prior to surgery.  #4 GERD: Continue with PPIs.  #5 hyperlipidemia: Continue statin   DVT prophylaxis: SCD.  Warfarin on hold Code Status: Full code Family Communication: Wife Disposition Plan: To be determined Consults called: Dr. Posey Pronto, orthopedic surgery Admission status: Inpatient  Severity of Illness: The appropriate patient status for this patient is INPATIENT. Inpatient status is judged to be reasonable and necessary in order to provide the required intensity of service to ensure the patient's safety. The patient's presenting symptoms, physical exam findings, and initial radiographic and laboratory data in the context of their chronic comorbidities is felt to place them at high risk for further clinical deterioration. Furthermore, it is not anticipated that the patient will be medically stable for discharge from the hospital within 2 midnights of admission. The following factors support the patient status of inpatient.   " The patient's presenting symptoms include fall with right hip pain. " The worrisome physical exam findings include laterally rotated hip with tenderness. " The initial radiographic and laboratory data are worrisome because of x-ray showing right intertrochanteric fracture. " The chronic co-morbidities include hypertension hyperlipidemia and A. fib.   * I certify that at the point of admission it is my clinical judgment that the patient will require inpatient hospital care spanning beyond 2 midnights from the point of admission due to high intensity of service, high risk for further deterioration and high frequency of surveillance required.Barbette Merino MD [facility] Pager [phone]  If 7PM-7AM, please contact night-coverage [email or web address] Password [facility]  03/16/2020, 11:58 PM

## 2020-03-17 ENCOUNTER — Encounter
Admission: EM | Disposition: A | Discharge status: Home Health | Payer: Self-pay | Source: Home / Self Care | Attending: Internal Medicine

## 2020-03-17 ENCOUNTER — Inpatient Hospital Stay: Payer: Medicare Other | Admitting: Anesthesiology

## 2020-03-17 ENCOUNTER — Inpatient Hospital Stay: Payer: Medicare Other

## 2020-03-17 ENCOUNTER — Encounter: Payer: Self-pay | Admitting: Internal Medicine

## 2020-03-17 HISTORY — PX: HIP ARTHROPLASTY: SHX981

## 2020-03-17 LAB — CBC
HCT: 38.2 % — ABNORMAL LOW (ref 39.0–52.0)
Hemoglobin: 12.5 g/dL — ABNORMAL LOW (ref 13.0–17.0)
MCH: 27.4 pg (ref 26.0–34.0)
MCHC: 32.7 g/dL (ref 30.0–36.0)
MCV: 83.8 fL (ref 80.0–100.0)
Platelets: 193 10*3/uL (ref 150–400)
RBC: 4.56 MIL/uL (ref 4.22–5.81)
RDW: 15.5 % (ref 11.5–15.5)
WBC: 8.7 10*3/uL (ref 4.0–10.5)
nRBC: 0 % (ref 0.0–0.2)

## 2020-03-17 LAB — COMPREHENSIVE METABOLIC PANEL
ALT: 14 U/L (ref 0–44)
AST: 17 U/L (ref 15–41)
Albumin: 3.3 g/dL — ABNORMAL LOW (ref 3.5–5.0)
Alkaline Phosphatase: 54 U/L (ref 38–126)
Anion gap: 6 (ref 5–15)
BUN: 15 mg/dL (ref 8–23)
CO2: 25 mmol/L (ref 22–32)
Calcium: 8 mg/dL — ABNORMAL LOW (ref 8.9–10.3)
Chloride: 102 mmol/L (ref 98–111)
Creatinine, Ser: 0.95 mg/dL (ref 0.61–1.24)
GFR calc Af Amer: >60 mL/min (ref >60)
GFR calc non Af Amer: >60 mL/min (ref >60)
Glucose, Bld: 105 mg/dL — ABNORMAL HIGH (ref 70–99)
Potassium: 4.4 mmol/L (ref 3.5–5.1)
Sodium: 133 mmol/L — ABNORMAL LOW (ref 135–145)
Total Bilirubin: 1.4 mg/dL — ABNORMAL HIGH (ref 0.3–1.2)
Total Protein: 6.2 g/dL — ABNORMAL LOW (ref 6.5–8.1)

## 2020-03-17 LAB — PROTIME-INR
INR: 1.6 — ABNORMAL HIGH (ref 0.8–1.2)
INR: 1.5 — ABNORMAL HIGH (ref 0.8–1.2)
Prothrombin Time: 19.3 seconds — ABNORMAL HIGH (ref 11.4–15.2)
Prothrombin Time: 18.4 seconds — ABNORMAL HIGH (ref 11.4–15.2)

## 2020-03-17 SURGERY — HEMIARTHROPLASTY, HIP, DIRECT ANTERIOR APPROACH, FOR FRACTURE
Anesthesia: General | Site: Hip | Laterality: Right

## 2020-03-17 MED ORDER — ENOXAPARIN SODIUM 40 MG/0.4ML ~~LOC~~ SOLN
40.0000 mg | SUBCUTANEOUS | Status: DC
  Administered 2020-03-18 – 2020-03-19 (×2): 40 mg via SUBCUTANEOUS
  Filled 2020-03-17 (×2): qty 0.4

## 2020-03-17 MED ORDER — PHENYLEPHRINE HCL (PRESSORS) 10 MG/ML IV SOLN
INTRAVENOUS | Status: AC
  Filled 2020-03-17: qty 1

## 2020-03-17 MED ORDER — HYDROMORPHONE HCL 1 MG/ML IJ SOLN: 0.2000 mg | INTRAMUSCULAR | Status: DC | PRN

## 2020-03-17 MED ORDER — CEFAZOLIN SODIUM-DEXTROSE 2-4 GM/100ML-% IV SOLN
2.0000 g | Freq: Four times a day (QID) | INTRAVENOUS | Status: AC
  Administered 2020-03-17 – 2020-03-18 (×2): 2 g via INTRAVENOUS
  Filled 2020-03-17 (×2): qty 100

## 2020-03-17 MED ORDER — SODIUM CHLORIDE 0.9 % IV SOLN: INTRAVENOUS | Status: DC

## 2020-03-17 MED ORDER — FENTANYL CITRATE (PF) 100 MCG/2ML IJ SOLN: 25.0000 ug | INTRAMUSCULAR | Status: DC | PRN

## 2020-03-17 MED ORDER — TRANEXAMIC ACID-NACL 1000-0.7 MG/100ML-% IV SOLN
1000.0000 mg | INTRAVENOUS | Status: AC
  Administered 2020-03-17: 1000 mg via INTRAVENOUS
  Filled 2020-03-17: qty 100

## 2020-03-17 MED ORDER — LACTATED RINGERS IV SOLN: INTRAVENOUS | Status: DC | PRN

## 2020-03-17 MED ORDER — CEFAZOLIN SODIUM-DEXTROSE 2-4 GM/100ML-% IV SOLN
2.0000 g | Freq: Once | INTRAVENOUS | Status: AC
  Administered 2020-03-17: 16:00:00 2 g via INTRAVENOUS
  Filled 2020-03-17: qty 100

## 2020-03-17 MED ORDER — TRAMADOL HCL 50 MG PO TABS
50.0000 mg | ORAL_TABLET | Freq: Four times a day (QID) | ORAL | Status: DC | PRN
  Administered 2020-03-19: 50 mg via ORAL
  Filled 2020-03-17: qty 1

## 2020-03-17 MED ORDER — DEXAMETHASONE SODIUM PHOSPHATE 10 MG/ML IJ SOLN
INTRAMUSCULAR | Status: DC | PRN
  Administered 2020-03-17: 5 mg via INTRAVENOUS

## 2020-03-17 MED ORDER — MIRABEGRON ER 25 MG PO TB24
25.0000 mg | ORAL_TABLET | Freq: Every day | ORAL | Status: DC
  Filled 2020-03-17 (×4): qty 1

## 2020-03-17 MED ORDER — NITROGLYCERIN 0.4 MG SL SUBL: 0.4000 mg | SUBLINGUAL_TABLET | SUBLINGUAL | Status: DC | PRN

## 2020-03-17 MED ORDER — ACETAMINOPHEN 10 MG/ML IV SOLN
INTRAVENOUS | Status: DC | PRN
  Administered 2020-03-17: 1000 mg via INTRAVENOUS

## 2020-03-17 MED ORDER — WARFARIN - PHARMACIST DOSING INPATIENT: Freq: Every day | Status: DC

## 2020-03-17 MED ORDER — FENTANYL CITRATE (PF) 100 MCG/2ML IJ SOLN
INTRAMUSCULAR | Status: DC | PRN
  Administered 2020-03-17: 25 ug via INTRAVENOUS
  Administered 2020-03-17: 50 ug via INTRAVENOUS
  Administered 2020-03-17: 25 ug via INTRAVENOUS

## 2020-03-17 MED ORDER — BUPIVACAINE LIPOSOME 1.3 % IJ SUSP
INTRAMUSCULAR | Status: DC | PRN
  Administered 2020-03-17: 16:00:00 50 mL

## 2020-03-17 MED ORDER — TRANEXAMIC ACID-NACL 1000-0.7 MG/100ML-% IV SOLN
1000.0000 mg | Freq: Once | INTRAVENOUS | Status: AC
  Administered 2020-03-17: 1000 mg via INTRAVENOUS

## 2020-03-17 MED ORDER — ROCURONIUM BROMIDE 100 MG/10ML IV SOLN
INTRAVENOUS | Status: DC | PRN
  Administered 2020-03-17: 40 mg via INTRAVENOUS

## 2020-03-17 MED ORDER — DEXAMETHASONE SODIUM PHOSPHATE 10 MG/ML IJ SOLN
INTRAMUSCULAR | Status: AC
  Filled 2020-03-17: qty 1

## 2020-03-17 MED ORDER — ONDANSETRON HCL 4 MG/2ML IJ SOLN
INTRAMUSCULAR | Status: AC
  Filled 2020-03-17: qty 2

## 2020-03-17 MED ORDER — PANTOPRAZOLE SODIUM 20 MG PO TBEC
20.0000 mg | DELAYED_RELEASE_TABLET | Freq: Every day | ORAL | Status: DC
  Administered 2020-03-17 – 2020-03-19 (×3): 20 mg via ORAL
  Filled 2020-03-17 (×3): qty 1

## 2020-03-17 MED ORDER — KETAMINE HCL 10 MG/ML IJ SOLN
INTRAMUSCULAR | Status: DC | PRN
  Administered 2020-03-17: 50 mg via INTRAVENOUS

## 2020-03-17 MED ORDER — FENTANYL CITRATE (PF) 100 MCG/2ML IJ SOLN
INTRAMUSCULAR | Status: AC
  Filled 2020-03-17: qty 2

## 2020-03-17 MED ORDER — ACETAMINOPHEN 10 MG/ML IV SOLN
INTRAVENOUS | Status: AC
  Filled 2020-03-17: qty 100

## 2020-03-17 MED ORDER — LIDOCAINE HCL (CARDIAC) PF 100 MG/5ML IV SOSY
PREFILLED_SYRINGE | INTRAVENOUS | Status: DC | PRN
  Administered 2020-03-17: 100 mg via INTRAVENOUS

## 2020-03-17 MED ORDER — SUGAMMADEX SODIUM 200 MG/2ML IV SOLN
INTRAVENOUS | Status: DC | PRN
  Administered 2020-03-17: 200 mg via INTRAVENOUS

## 2020-03-17 MED ORDER — BISACODYL 10 MG RE SUPP: 10.0000 mg | Freq: Every day | RECTAL | Status: DC | PRN

## 2020-03-17 MED ORDER — PHENOL 1.4 % MT LIQD
1.0000 | OROMUCOSAL | Status: DC | PRN
  Filled 2020-03-17: qty 177

## 2020-03-17 MED ORDER — MENTHOL 3 MG MT LOZG
1.0000 | LOZENGE | OROMUCOSAL | Status: DC | PRN
  Filled 2020-03-17: qty 9

## 2020-03-17 MED ORDER — ONDANSETRON HCL 4 MG PO TABS: 4.0000 mg | ORAL_TABLET | Freq: Four times a day (QID) | ORAL | Status: DC | PRN

## 2020-03-17 MED ORDER — PROPOFOL 10 MG/ML IV BOLUS
INTRAVENOUS | Status: DC | PRN
  Administered 2020-03-17: 70 mg via INTRAVENOUS

## 2020-03-17 MED ORDER — ONDANSETRON HCL 4 MG/2ML IJ SOLN: 4.0000 mg | Freq: Once | INTRAMUSCULAR | Status: DC | PRN

## 2020-03-17 MED ORDER — DILTIAZEM HCL 30 MG PO TABS
30.0000 mg | ORAL_TABLET | Freq: Every day | ORAL | Status: DC
  Administered 2020-03-18: 30 mg via ORAL
  Filled 2020-03-17: qty 1

## 2020-03-17 MED ORDER — EPHEDRINE 5 MG/ML INJ
INTRAVENOUS | Status: AC
  Filled 2020-03-17: qty 10

## 2020-03-17 MED ORDER — WARFARIN SODIUM 2.5 MG PO TABS
2.5000 mg | ORAL_TABLET | Freq: Every day | ORAL | Status: DC
  Filled 2020-03-17 (×2): qty 1

## 2020-03-17 MED ORDER — ACETAMINOPHEN 500 MG PO TABS
1000.0000 mg | ORAL_TABLET | Freq: Three times a day (TID) | ORAL | Status: AC
  Administered 2020-03-18: 1000 mg via ORAL
  Filled 2020-03-17 (×3): qty 2

## 2020-03-17 MED ORDER — MAGNESIUM OXIDE 400 (241.3 MG) MG PO TABS
400.0000 mg | ORAL_TABLET | Freq: Two times a day (BID) | ORAL | Status: DC
  Administered 2020-03-17 – 2020-03-19 (×5): 400 mg via ORAL
  Filled 2020-03-17 (×5): qty 1

## 2020-03-17 MED ORDER — ONDANSETRON HCL 4 MG/2ML IJ SOLN
INTRAMUSCULAR | Status: DC | PRN
  Administered 2020-03-17: 4 mg via INTRAVENOUS

## 2020-03-17 MED ORDER — SODIUM CHLORIDE 0.9 % IR SOLN
Status: DC | PRN
  Administered 2020-03-17: 16:00:00 1000 mL

## 2020-03-17 MED ORDER — DOCUSATE SODIUM 100 MG PO CAPS
100.0000 mg | ORAL_CAPSULE | Freq: Two times a day (BID) | ORAL | Status: DC
  Administered 2020-03-17 – 2020-03-19 (×4): 100 mg via ORAL
  Filled 2020-03-17 (×4): qty 1

## 2020-03-17 MED ORDER — OMEGA-3-ACID ETHYL ESTERS 1 G PO CAPS
1.0000 g | ORAL_CAPSULE | Freq: Every day | ORAL | Status: DC
  Administered 2020-03-18 – 2020-03-19 (×2): 1 g via ORAL
  Filled 2020-03-17 (×2): qty 1

## 2020-03-17 MED ORDER — SENNOSIDES-DOCUSATE SODIUM 8.6-50 MG PO TABS: 1.0000 | ORAL_TABLET | Freq: Every evening | ORAL | Status: DC | PRN

## 2020-03-17 MED ORDER — METOCLOPRAMIDE HCL 5 MG/ML IJ SOLN: 5.0000 mg | Freq: Three times a day (TID) | INTRAMUSCULAR | Status: DC | PRN

## 2020-03-17 MED ORDER — OXYCODONE HCL 5 MG PO TABS
5.0000 mg | ORAL_TABLET | ORAL | Status: DC | PRN
  Administered 2020-03-18: 10 mg via ORAL
  Filled 2020-03-17: qty 2

## 2020-03-17 MED ORDER — KETAMINE HCL 50 MG/ML IJ SOLN
INTRAMUSCULAR | Status: AC
  Filled 2020-03-17: qty 10

## 2020-03-17 MED ORDER — FINASTERIDE 5 MG PO TABS
5.0000 mg | ORAL_TABLET | Freq: Every day | ORAL | Status: DC
  Administered 2020-03-17 – 2020-03-19 (×3): 5 mg via ORAL
  Filled 2020-03-17 (×3): qty 1

## 2020-03-17 MED ORDER — ONDANSETRON HCL 4 MG/2ML IJ SOLN: 4.0000 mg | Freq: Four times a day (QID) | INTRAMUSCULAR | Status: DC | PRN

## 2020-03-17 MED ORDER — EPHEDRINE SULFATE 50 MG/ML IJ SOLN
INTRAMUSCULAR | Status: DC | PRN
  Administered 2020-03-17: 10 mg via INTRAVENOUS
  Administered 2020-03-17: 15 mg via INTRAVENOUS
  Administered 2020-03-17: 20 mg via INTRAVENOUS

## 2020-03-17 MED ORDER — TAMSULOSIN HCL 0.4 MG PO CAPS
0.4000 mg | ORAL_CAPSULE | Freq: Two times a day (BID) | ORAL | Status: DC
  Administered 2020-03-17 – 2020-03-19 (×6): 0.4 mg via ORAL
  Filled 2020-03-17 (×6): qty 1

## 2020-03-17 MED ORDER — ADULT MULTIVITAMIN W/MINERALS CH
1.0000 | ORAL_TABLET | Freq: Every day | ORAL | Status: DC
  Administered 2020-03-18 – 2020-03-19 (×2): 1 via ORAL
  Filled 2020-03-17 (×2): qty 1

## 2020-03-17 MED ORDER — PHENYLEPHRINE HCL (PRESSORS) 10 MG/ML IV SOLN
INTRAVENOUS | Status: DC | PRN
  Administered 2020-03-17: 300 ug via INTRAVENOUS
  Administered 2020-03-17: 200 ug via INTRAVENOUS
  Administered 2020-03-17 (×2): 300 ug via INTRAVENOUS

## 2020-03-17 MED ORDER — DILTIAZEM HCL 30 MG PO TABS
30.0000 mg | ORAL_TABLET | Freq: Three times a day (TID) | ORAL | Status: DC
  Administered 2020-03-17 (×2): 30 mg via ORAL
  Filled 2020-03-17 (×2): qty 1

## 2020-03-17 MED ORDER — KETOROLAC TROMETHAMINE 15 MG/ML IJ SOLN
7.5000 mg | Freq: Four times a day (QID) | INTRAMUSCULAR | Status: AC
  Administered 2020-03-17 – 2020-03-18 (×4): 7.5 mg via INTRAVENOUS
  Filled 2020-03-17 (×4): qty 1

## 2020-03-17 MED ORDER — OXYCODONE HCL 5 MG PO TABS: 2.5000 mg | ORAL_TABLET | ORAL | Status: DC | PRN

## 2020-03-17 MED ORDER — METOCLOPRAMIDE HCL 10 MG PO TABS: 5.0000 mg | ORAL_TABLET | Freq: Three times a day (TID) | ORAL | Status: DC | PRN

## 2020-03-17 MED ORDER — TRANEXAMIC ACID-NACL 1000-0.7 MG/100ML-% IV SOLN
INTRAVENOUS | Status: AC
  Filled 2020-03-17: qty 100

## 2020-03-17 SURGICAL SUPPLY — 68 items
BLADE SAW SGTL 13X75X1.27 (BLADE) ×3 IMPLANT
BLADE SURG SZ10 CARB STEEL (BLADE) ×3 IMPLANT
BNDG COHESIVE 4X5 TAN STRL (GAUZE/BANDAGES/DRESSINGS) ×3 IMPLANT
CANISTER SUCT 1200ML W/VALVE (MISCELLANEOUS) ×3 IMPLANT
CANISTER SUCT 3000ML PPV (MISCELLANEOUS) ×6 IMPLANT
CHLORAPREP W/TINT 26 (MISCELLANEOUS) ×3 IMPLANT
COVER BACK TABLE REUSABLE LG (DRAPES) ×3 IMPLANT
COVER WAND RF STERILE (DRAPES) ×3 IMPLANT
DERMABOND ADVANCED (GAUZE/BANDAGES/DRESSINGS) ×2
DERMABOND ADVANCED .7 DNX12 (GAUZE/BANDAGES/DRESSINGS) ×1 IMPLANT
DRAPE 3/4 80X56 (DRAPES) ×9 IMPLANT
DRAPE INCISE IOBAN 66X60 STRL (DRAPES) ×3 IMPLANT
DRAPE SPLIT 6X30 W/TAPE (DRAPES) ×3 IMPLANT
DRAPE SURG 17X11 SM STRL (DRAPES) ×3 IMPLANT
DRSG OPSITE POSTOP 4X10 (GAUZE/BANDAGES/DRESSINGS) ×3 IMPLANT
DRSG OPSITE POSTOP 4X8 (GAUZE/BANDAGES/DRESSINGS) ×3 IMPLANT
ELECT BLADE 6.5 EXT (BLADE) ×3 IMPLANT
ELECT CAUTERY BLADE 6.4 (BLADE) ×3 IMPLANT
ELECT REM PT RETURN 9FT ADLT (ELECTROSURGICAL) ×3
ELECTRODE REM PT RTRN 9FT ADLT (ELECTROSURGICAL) ×1 IMPLANT
GAUZE SPONGE 4X4 12PLY STRL (GAUZE/BANDAGES/DRESSINGS) ×3 IMPLANT
GAUZE XEROFORM 1X8 LF (GAUZE/BANDAGES/DRESSINGS) ×3 IMPLANT
GLOVE BIOGEL PI IND STRL 8 (GLOVE) ×2 IMPLANT
GLOVE BIOGEL PI INDICATOR 8 (GLOVE) ×4
GLOVE SURG ORTHO 8.0 STRL STRW (GLOVE) ×6 IMPLANT
GOWN STRL REUS W/ TWL LRG LVL3 (GOWN DISPOSABLE) ×1 IMPLANT
GOWN STRL REUS W/ TWL XL LVL3 (GOWN DISPOSABLE) ×1 IMPLANT
GOWN STRL REUS W/TWL LRG LVL3 (GOWN DISPOSABLE) ×3
GOWN STRL REUS W/TWL XL LVL3 (GOWN DISPOSABLE) ×3
HEAD MODULAR ENDO (Orthopedic Implant) ×3 IMPLANT
HEAD UNPLR 52XMDLR STRL HIP (Orthopedic Implant) IMPLANT
HEMOVAC 400ML (MISCELLANEOUS)
KIT DRAIN HEMOVAC JP 7FR 400ML (MISCELLANEOUS) IMPLANT
KIT TURNOVER KIT A (KITS) ×3 IMPLANT
NDL FILTER BLUNT 18X1 1/2 (NEEDLE) ×1 IMPLANT
NDL MAYO CATGUT SZ4 TPR NDL (NEEDLE) ×1 IMPLANT
NDL SAFETY ECLIPSE 18X1.5 (NEEDLE) ×1 IMPLANT
NEEDLE FILTER BLUNT 18X 1/2SAF (NEEDLE) ×2
NEEDLE FILTER BLUNT 18X1 1/2 (NEEDLE) ×1 IMPLANT
NEEDLE HYPO 18GX1.5 SHARP (NEEDLE) ×3
NEEDLE HYPO 22GX1.5 SAFETY (NEEDLE) ×3 IMPLANT
NEEDLE MAYO CATGUT SZ4 (NEEDLE) ×3 IMPLANT
NEPTUNE MANIFOLD (MISCELLANEOUS) ×3 IMPLANT
NS IRRIG 1000ML POUR BTL (IV SOLUTION) ×3 IMPLANT
PACK HIP PROSTHESIS (MISCELLANEOUS) ×3 IMPLANT
PENCIL SMOKE ULTRAEVAC 22 CON (MISCELLANEOUS) ×3 IMPLANT
PILLOW ABDUCTION FOAM SM (MISCELLANEOUS) ×3 IMPLANT
PULSAVAC PLUS IRRIG FAN TIP (DISPOSABLE) ×3
RETRIEVER SUT HEWSON (MISCELLANEOUS) IMPLANT
SLEEVE UNITRAX V40 (Orthopedic Implant) ×3 IMPLANT
SLEEVE UNITRAX V40 +8 (Orthopedic Implant) IMPLANT
SOL .9 NS 3000ML IRR  AL (IV SOLUTION) ×3
SOL .9 NS 3000ML IRR UROMATIC (IV SOLUTION) ×1 IMPLANT
STAPLER SKIN PROX 35W (STAPLE) ×3 IMPLANT
STEM ACCOLADE SZ 6 (Hips) ×2 IMPLANT
SUT ETHIBOND #5 BRAIDED 30INL (SUTURE) ×3 IMPLANT
SUT MNCRL 4-0 (SUTURE) ×3
SUT MNCRL 4-0 27XMFL (SUTURE) ×1
SUT VIC AB 0 CT1 36 (SUTURE) ×3 IMPLANT
SUT VIC AB 2-0 CT2 27 (SUTURE) ×6 IMPLANT
SUTURE MNCRL 4-0 27XMF (SUTURE) ×1 IMPLANT
SYR 20ML LL LF (SYRINGE) ×3 IMPLANT
SYR 50ML LL SCALE MARK (SYRINGE) ×3 IMPLANT
TAPE MICROFOAM 4IN (TAPE) ×3 IMPLANT
TAPE TRANSPORE STRL 2 31045 (GAUZE/BANDAGES/DRESSINGS) ×3 IMPLANT
TIP BRUSH PULSAVAC PLUS 24.33 (MISCELLANEOUS) ×3 IMPLANT
TIP FAN IRRIG PULSAVAC PLUS (DISPOSABLE) ×1 IMPLANT
TUBE SUCT KAM VAC (TUBING) ×3 IMPLANT

## 2020-03-17 NOTE — Progress Notes (Signed)
ANTICOAGULATION CONSULT NOTE - Initial Consult  Pharmacy Consult for Warfarin  Indication: atrial fibrillation  Allergies  Allergen Reactions  . Ciprofloxacin     Diarrhea and stomach pains  . Lanoxin [Digoxin]   . Lopressor [Metoprolol Tartrate]   . Sulfonamide Derivatives     Rash   . Verapamil     Patient Measurements: Height: 5\' 10"  (177.8 cm) Weight: 191 lb 12.8 oz (87 kg) IBW/kg (Calculated) : 73 Heparin Dosing Weight:   Vital Signs: Temp: 99 F (37.2 C) (03/28 1244) Temp Source: Oral (03/28 1244) BP: 124/59 (03/28 1244) Pulse Rate: 56 (03/28 1244)  Labs: Recent Labs    03/16/20 1612 03/16/20 1808 03/17/20 0628 03/17/20 1016  HGB 13.1  --  12.5*  --   HCT 39.0  --  38.2*  --   PLT 202  --  193  --   APTT  --  44*  --   --   LABPROT  --  26.0* 19.3* 18.4*  INR  --  2.4* 1.6* 1.5*  CREATININE 1.02  --  0.95  --     Estimated Creatinine Clearance: 59.8 mL/min (by C-G formula based on SCr of 0.95 mg/dL).   Medical History: Past Medical History:  Diagnosis Date  . Atypical chest pain    a. Nonobstructive, very minor irregs by cath 2007;  b. 06/2009 Ex MV: small area of apical lateral ischemia;  c. 06/2009 Cath: essentially normal;  d. 04/2014 Lexiscan MV: EF 59%, no ischemia/infarct->low risk.  . Cancer ([place])   . Chronic anticoagulation    a. Chronic Coumadin  . Chronic anxiety   . Diverticulosis   . GERD (gastroesophageal reflux disease)   . Hypertension   . IBS (irritable bowel syndrome)   . Nonischemic cardiomyopathy ([place])    a. EF 45% by echo 2010;  b. 05/2012 Echo: EF 55-65%, no rwma, Gr 1 DD, mild AI/MR, mildly dil LA.  . Paralyzed vocal cords   . Paroxysmal atrial fibrillation ([place])    a. Treated with flecainide  . Proteinuria    History of    Medications:  Medications Prior to Admission  Medication Sig Dispense Refill Last Dose  . acidophilus (RISAQUAD) CAPS capsule Take 1 capsule by mouth daily.     . Cholecalciferol (VITAMIN D3) 400  UNITS CAPS Take 800 Units by mouth daily.      . Cobalamine Combinations (VITAMIN B12-FOLIC ACID) XX123456 MCG TABS Take 1 tablet by mouth daily.     . Coenzyme Q10 50 MG CAPS Take 50 mg by mouth 2 (two) times daily.      . Cranberry 500 MG CAPS Take 500 mg by mouth daily.     . diclofenac sodium (VOLTAREN) 1 % GEL Apply 2-4 g topically every 6 (six) hours as needed.    Unknown at PRN  . diltiazem (CARDIZEM) 30 MG tablet Take 1 tablet (30 mg) by mouth once daily at bedtime 30 tablet 11 03/15/2020 at 2100  . finasteride (PROSCAR) 5 MG tablet Take 1 tablet (5 mg total) by mouth daily. 30 tablet 11 03/16/2020 at 0800  . Flaxseed, Linseed, (FLAX SEED OIL) 1000 MG CAPS Take 1,000 mg by mouth daily.      . flecainide (TAMBOCOR) 50 MG tablet Take 2 tablets (100 mg) by mouth twice daily as needed for recurrent atrial fibrillation 12 tablet 3 Unknown at PRN  . Ginger 500 MG CAPS Take 500 mg by mouth 2 (two) times daily.     . hydrocortisone (  ANUSOL-HC) 25 MG suppository Place 25 mg rectally daily as needed for hemorrhoids or anal itching.   Unknown at PRN  . magnesium oxide (MAG-OX) 400 MG tablet TAKE 1 TABLET BY MOUTH TWICE A DAY (Patient taking differently: Take 400 mg by mouth 2 (two) times daily. ) 180 tablet 0 03/16/2020 at 0800  . mirabegron ER (MYRBETRIQ) 25 MG TB24 tablet Take 1 tablet (25 mg total) by mouth daily. (Patient taking differently: Take 25 mg by mouth at bedtime. ) 30 tablet 11 03/15/2020 at 2100  . Multiple Vitamin (MULTIVITAMIN) tablet Take 1 tablet by mouth daily.       . nitroGLYCERIN (NITROSTAT) 0.4 MG SL tablet PLACE 1 TABLET (0.4 MG TOTAL) UNDER THE TONGUE EVERY 5 (FIVE) MINUTES AS NEEDED FOR CHEST PAIN. 90 tablet 0 Unknown at PRN  . Omega-3 Fatty Acids (FISH OIL) 1000 MG CAPS Take 1,000 mg by mouth daily.      . pantoprazole (PROTONIX) 20 MG tablet Take 20 mg by mouth daily.      . polyethylene glycol (MIRALAX / GLYCOLAX) packet Take 17 g by mouth daily as needed for mild constipation  or moderate constipation.   Unknown at PRN  . saw palmetto 160 MG capsule Take 160 mg by mouth 2 (two) times daily.     . tamsulosin (FLOMAX) 0.4 MG CAPS capsule Take 1 capsule (0.4 mg total) by mouth daily after supper. (Patient taking differently: Take 0.4 mg by mouth 2 (two) times daily. ) 180 capsule 2 03/16/2020 at 0800  . traMADol (ULTRAM) 50 MG tablet Take 50 mg by mouth every 6 (six) hours as needed for moderate pain.   Unknown at PRN  . warfarin (COUMADIN) 2.5 MG tablet Take 2.5 mg by mouth every evening.    03/15/2020 at 1800    Assessment: Pharmacy consulted to dose warfarin in this 86 year old male S/P hip hemiarthroplasty on 3/28 after falling at home on 3/27.   Pt was warfarin 2.5 mg PO daily for Afib and was therapeutic on arrival .  Pt received Vit K 5 mg IV X 1 on 3/27 in preparation for surgery.    3/27: INR @ 1800 = 2.4  3/28: INR @ 1000 = 1.5  Goal of Therapy:  INR 2-3   Plan:  Will resume pt's home dose of warfarin 2.5 mg PO daily on 3/29 @ 1800. Will check INR daily until therapeutic X 2 . May need to f/u with provider regarding need for heparin bridge since pt received Vit K IV on 3/27 and will be a few days until INR is therapeutic again.    Trea Latner D 03/17/2020,6:04 PM

## 2020-03-17 NOTE — Consult Note (Addendum)
ORTHOPAEDIC CONSULTATION  REQUESTING PHYSICIAN: Lavina Hamman, MD  Chief Complaint:   R hip pain  History of Present Illness: Robert Burch is a 86 y.o. male who had a fall at home yesterday after tripping on a mat in his garage.  The patient noted immediate hip pain and inability to ambulate.  The patient ambulates unassisted at baseline and lives with his wife. He is relatively active around the house. Pain is described as sharp at its worst and a dull ache at its best.  Pain is rated a 10 out of 10 in severity.  Pain is improved with rest and immobilization.  Pain is worse with any sort of movement.  X-rays in the emergency department show a right displaced femoral neck fracture.   Of note, he is on coumadin for a history of atrial fibrillation.   Past Medical History:  Diagnosis Date  . Atypical chest pain    a. Nonobstructive, very minor irregs by cath 2007;  b. 06/2009 Ex MV: small area of apical lateral ischemia;  c. 06/2009 Cath: essentially normal;  d. 04/2014 Lexiscan MV: EF 59%, no ischemia/infarct->low risk.  . Cancer ([place])   . Chronic anticoagulation    a. Chronic Coumadin  . Chronic anxiety   . Diverticulosis   . GERD (gastroesophageal reflux disease)   . Hypertension   . IBS (irritable bowel syndrome)   . Nonischemic cardiomyopathy ([place])    a. EF 45% by echo 2010;  b. 05/2012 Echo: EF 55-65%, no rwma, Gr 1 DD, mild AI/MR, mildly dil LA.  . Paralyzed vocal cords   . Paroxysmal atrial fibrillation ([place])    a. Treated with flecainide  . Proteinuria    History of   Past Surgical History:  Procedure Laterality Date  . CARDIAC CATHETERIZATION    . cataract surgery Bilateral   . MASS BIOPSY  2013   Social History   Socioeconomic History  . Marital status: Married    Spouse name: Not on file  . Number of children: 3  . Years of education: Not on file  . Highest education level: Not on file   Occupational History  . 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 Smoking status: Never Smoker  . Smokeless tobacco: Never Used  Substance and Sexual Activity  . Alcohol use: No    Alcohol/week: 2.0 standard drinks    Types: 1 Glasses of wine, 1 Standard drinks or equivalent per week    Comment: occassionally  . Drug use: No  . Sexual activity: Not on file  Other Topics Concern  . Not on file  Social History Narrative   Pt lives in [place] with his wife.  Retired Financial planner for [employer]   Social Determinants of Radio broadcast assistant Strain:   . Difficulty of Paying Living Expenses:   Food Insecurity:   . Worried About Charity fundraiser in the Last Year:   . Arboriculturist in the Last Year:   Transportation Needs:   . Film/video editor (Medical):   Marland Kitchen Lack of Transportation (Non-Medical):   Physical Activity:   . Days of Exercise per Week:   . Minutes of Exercise per Session:   Stress:   . Feeling of Stress :   Social Connections:   . Frequency of Communication with Friends and Family:   . Frequency of Social Gatherings with Friends and Family:   .  Attends Religious Services:   . Active Member of Clubs or Organizations:   . Attends Archivist Meetings:   Marland Kitchen Marital Status:    Family History  Problem Relation Age of Onset  . Heart attack Neg Hx    Allergies  Allergen Reactions  . Ciprofloxacin     Diarrhea and stomach pains  . Lanoxin [Digoxin]   . Lopressor [Metoprolol Tartrate]   . Sulfonamide Derivatives     Rash   . Verapamil    Prior to Admission medications   Medication Sig Start Date End Date Taking? Authorizing Provider  acidophilus (RISAQUAD) CAPS capsule Take 1 capsule by mouth daily.   Yes [provider]  Cholecalciferol (VITAMIN D3) 400 UNITS CAPS Take 800 Units by mouth daily.    Yes [provider]  Cobalamine Combinations (VITAMIN B12-FOLIC ACID)  [id number] MCG TABS Take 1 tablet by mouth daily.   Yes [provider]  Coenzyme Q10 50 MG CAPS Take 50 mg by mouth 2 (two) times daily.    Yes [provider]  Cranberry 500 MG CAPS Take 500 mg by mouth daily.   Yes [provider]  diclofenac sodium (VOLTAREN) 1 % GEL Apply 2-4 g topically every 6 (six) hours as needed.  09/23/17  Yes [provider]  diltiazem (CARDIZEM) 30 MG tablet Take 1 tablet (30 mg) by mouth once daily at bedtime 11/09/19  Yes Deboraha Sprang, MD  finasteride (PROSCAR) 5 MG tablet Take 1 tablet (5 mg total) by mouth daily. 12/28/19  Yes Billey Co, MD  Flaxseed, Linseed, (FLAX SEED OIL) 1000 MG CAPS Take 1,000 mg by mouth daily.    Yes [provider]  flecainide (TAMBOCOR) 50 MG tablet Take 2 tablets (100 mg) by mouth twice daily as needed for recurrent atrial fibrillation 07/06/19  Yes Deboraha Sprang, MD  Ginger 500 MG CAPS Take 500 mg by mouth 2 (two) times daily.   Yes [provider]  hydrocortisone (ANUSOL-HC) 25 MG suppository Place 25 mg rectally daily as needed for hemorrhoids or anal itching.   Yes [provider]  magnesium oxide (MAG-OX) 400 MG tablet TAKE 1 TABLET BY MOUTH TWICE A DAY Patient taking differently: Take 400 mg by mouth 2 (two) times daily.  03/04/20  Yes [name], Kathlene November, MD  mirabegron ER (MYRBETRIQ) 25 MG TB24 tablet Take 1 tablet (25 mg total) by mouth daily. Patient taking differently: Take 25 mg by mouth at bedtime.  11/01/19  Yes Billey Co, MD  Multiple Vitamin (MULTIVITAMIN) tablet Take 1 tablet by mouth daily.     Yes [provider]  nitroGLYCERIN (NITROSTAT) 0.4 MG SL tablet PLACE 1 TABLET (0.4 MG TOTAL) UNDER THE TONGUE EVERY 5 (FIVE) MINUTES AS NEEDED FOR CHEST PAIN. 09/30/18  Yes Deboraha Sprang, MD  Omega-3 Fatty Acids (FISH OIL) 1000 MG CAPS Take 1,000 mg by mouth daily.    Yes [provider]  pantoprazole (PROTONIX) 20 MG tablet Take 20 mg by  mouth daily.    Yes [provider]  polyethylene glycol (MIRALAX / GLYCOLAX) packet Take 17 g by mouth daily as needed for mild constipation or moderate constipation.   Yes [provider]  saw palmetto 160 MG capsule Take 160 mg by mouth 2 (two) times daily.   Yes [provider]  tamsulosin (FLOMAX) 0.4 MG CAPS capsule Take 1 capsule (0.4 mg total) by mouth daily after supper. Patient taking differently: Take 0.4 mg by  mouth 2 (two) times daily.  12/28/19  Yes Billey Co, MD  traMADol (ULTRAM) 50 MG tablet Take 50 mg by mouth every 6 (six) hours as needed for moderate pain.   Yes [provider]  warfarin (COUMADIN) 2.5 MG tablet Take 2.5 mg by mouth every evening.    Yes [provider]   Recent Labs    03/16/20 1612 03/16/20 1808 03/17/20 0628 03/17/20 1016  WBC 6.6  --  8.7  --   HGB 13.1  --  12.5*  --   HCT 39.0  --  38.2*  --   PLT 202  --  193  --   K 4.3  --  4.4  --   CL 105  --  102  --   CO2 24  --  25  --   BUN 16  --  15  --   CREATININE 1.02  --  0.95  --   GLUCOSE 123*  --  105*  --   CALCIUM 8.7*  --  8.0*  --   INR  --    < > 1.6* 1.5*   < > = values in this interval not displayed.   DG Chest 1 View  Result Date: 03/16/2020 CLINICAL DATA:  Hip fracture. EXAM: CHEST  1 VIEW COMPARISON:  August 08, 2018 FINDINGS: The heart size is mildly enlarged. There is no pneumothorax or large pleural effusion. No focal infiltrate. No acute osseous abnormality involving the visualized portions of the thorax. IMPRESSION: No active disease. Electronically Signed   By: Constance Holster M.D.   On: 03/16/2020 17:22   CT Head Wo Contrast  Result Date: 03/16/2020 CLINICAL DATA:  Pain status post fall EXAM: CT HEAD WITHOUT CONTRAST CT CERVICAL SPINE WITHOUT CONTRAST TECHNIQUE: Multidetector CT imaging of the head and cervical spine was performed following the standard protocol without intravenous contrast. Multiplanar CT image  reconstructions of the cervical spine were also generated. COMPARISON:  MRI cervical spine dated May 31, 2012. CT head dated May 17, 2011. FINDINGS: CT HEAD FINDINGS Brain: No evidence of acute infarction, hemorrhage, hydrocephalus, extra-axial collection or mass lesion/mass effect. Atrophy and chronic microvascular ischemic changes are noted. Vascular: No hyperdense vessel or unexpected calcification. Skull: Normal. Negative for fracture or focal lesion. Sinuses/Orbits: There is a mucosal retention cyst in the left maxillary sinus. There is opacification of the left mastoid air cells. The remaining paranasal sinuses and mastoid air cells are essentially clear. Other: None. CT CERVICAL SPINE FINDINGS Alignment: Normal. Skull base and vertebrae: No acute fracture. No primary bone lesion or focal pathologic process. Soft tissues and spinal canal: No prevertebral fluid or swelling. No visible canal hematoma. Disc levels: Multilevel disc height loss is noted throughout the cervical spine, greatest at the C6-C7 level. Multilevel osseous neural foraminal narrowing is noted bilaterally. Upper chest: Negative. Other: None IMPRESSION: 1. No acute intracranial abnormality. 2. No acute cervical spine fracture. Electronically Signed   By: Constance Holster M.D.   On: 03/16/2020 17:01   CT Cervical Spine Wo Contrast  Result Date: 03/16/2020 CLINICAL DATA:  Pain status post fall EXAM: CT HEAD WITHOUT CONTRAST CT CERVICAL SPINE WITHOUT CONTRAST TECHNIQUE: Multidetector CT imaging of the head and cervical spine was performed following the standard protocol without intravenous contrast. Multiplanar CT image reconstructions of the cervical spine were also generated. COMPARISON:  MRI cervical spine dated May 31, 2012. CT head dated May 17, 2011. FINDINGS: CT HEAD FINDINGS Brain: No evidence of acute infarction,  hemorrhage, hydrocephalus, extra-axial collection or mass lesion/mass effect. Atrophy and chronic microvascular  ischemic changes are noted. Vascular: No hyperdense vessel or unexpected calcification. Skull: Normal. Negative for fracture or focal lesion. Sinuses/Orbits: There is a mucosal retention cyst in the left maxillary sinus. There is opacification of the left mastoid air cells. The remaining paranasal sinuses and mastoid air cells are essentially clear. Other: None. CT CERVICAL SPINE FINDINGS Alignment: Normal. Skull base and vertebrae: No acute fracture. No primary bone lesion or focal pathologic process. Soft tissues and spinal canal: No prevertebral fluid or swelling. No visible canal hematoma. Disc levels: Multilevel disc height loss is noted throughout the cervical spine, greatest at the C6-C7 level. Multilevel osseous neural foraminal narrowing is noted bilaterally. Upper chest: Negative. Other: None IMPRESSION: 1. No acute intracranial abnormality. 2. No acute cervical spine fracture. Electronically Signed   By: Constance Holster M.D.   On: 03/16/2020 17:01   DG Hip Unilat W or Wo Pelvis 2-3 Views Right  Result Date: 03/16/2020 CLINICAL DATA:  Pain status post fall EXAM: DG HIP (WITH OR WITHOUT PELVIS) 2-3V RIGHT COMPARISON:  None. FINDINGS: There is an acute displaced transcervical fracture of the proximal right femur. There is no dislocation. There is osteopenia. There is mild-to-moderate osteoarthritis involving both hips. Multiple calcifications project over the patient's pelvis and are favored to represent phleboliths. IMPRESSION: 1. Acute displaced transcervical fracture of the proximal right femur. 2. 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 Electronically Signed   By: Constance Holster M.D.   On: 03/16/2020 17:19     Positive ROS: All other systems have been reviewed and were otherwise negative with the exception of those mentioned in the HPI and as above.  Physical Exam: BP (!) 141/73 (BP Location: Left Arm)   Pulse 62   Temp 99.5 F (37.5 C) (Oral)   Resp 20   Ht 5\' 10"   (1.778 m)   Wt 87 kg   SpO2 90%   BMI 27.52 kg/m  General:  Alert, no acute distress Psychiatric:  Patient is competent for consent with normal mood and affect   Cardiovascular:  No pedal edema, irregular rate and rhythm Respiratory:  No wheezing, non-labored breathing GI:  Abdomen is soft and non-tender Skin:  No lesions in the area of chief complaint, no erythema Neurologic:  Sensation intact distally, CN grossly intact Lymphatic:  No axillary or cervical lymphadenopathy  Orthopedic Exam:  RLE: 5/5 DF/PF/EHL SILT s/s/t/sp/dp distr Foot wwp +Log roll/axial load   X-rays:  As above: right displaced femoral neck fracture  Assessment/Plan: Wynn Pociask is a 86 y.o. male with a right displaced femoral neck fracture   1. I discussed the various treatment options including both surgical and non-surgical management of the fracture with the patient. We discussed the high risk of perioperative complications due to patient's age and other co-morbidities. After discussion of risks, benefits, and alternatives to surgery,  The patient was in agreement to proceed with surgery. The goals of surgery would be to provide adequate pain relief and allow for mobilization. Plan for surgery is R hip hemiarthroplasty later today 2. NPO until OR 3. Hold anticoagulation in advance of OR. IV Vit K administered last night. Latest INR was 1.5     [name]   03/17/2020 11:47 AM

## 2020-03-17 NOTE — Transfer of Care (Signed)
Immediate Anesthesia Transfer of Care Note  Patient: Robert Burch  Procedure(s) Performed: ARTHROPLASTY BIPOLAR HIP (HEMIARTHROPLASTY) (Right Hip)  Patient Location: PACU  Anesthesia Type:General  Level of Consciousness: sedated  Airway & Oxygen Therapy: Patient Spontanous Breathing and Patient connected to face mask oxygen  Post-op Assessment: Report given to RN and Post -op Vital signs reviewed and stable  Post vital signs: Reviewed and stable  Last Vitals:  Vitals Value Taken Time  BP 128/57 03/17/20 1805  Temp    Pulse 47 03/17/20 1808  Resp 16 03/17/20 1808  SpO2 98 % 03/17/20 1808  Vitals shown include unvalidated device data.  Last Pain:  Vitals:   03/17/20 1244  TempSrc: Oral  PainSc:       Patients Stated Pain Goal: 0 ([id number])  Complications: No apparent anesthesia complications

## 2020-03-17 NOTE — Anesthesia Procedure Notes (Signed)
Procedure Name: Intubation Date/Time: 03/17/2020 3:44 PM Performed by: Clinton Sawyer, CRNA Pre-anesthesia Checklist: Patient identified, Emergency Drugs available, Suction available, Patient being monitored and Timeout performed Patient Re-evaluated:Patient Re-evaluated prior to induction Oxygen Delivery Method: Circle system utilized Preoxygenation: Pre-oxygenation with 100% oxygen Induction Type: IV induction Ventilation: Mask ventilation without difficulty Laryngoscope Size: 4 and McGraph Grade View: Grade I Tube type: Oral Number of attempts: 1 Airway Equipment and Method: Stylet and Video-laryngoscopy Placement Confirmation: ETT inserted through vocal cords under direct vision,  positive ETCO2 and breath sounds checked- equal and bilateral Secured at: 23 cm Tube secured with: Tape Dental Injury: Teeth and Oropharynx as per pre-operative assessment

## 2020-03-17 NOTE — Op Note (Signed)
DATE OF SURGERY: 03/17/2020  PREOPERATIVE DIAGNOSIS: Right femoral neck fracture  POSTOPERATIVE DIAGNOSIS: Right femoral neck fracture  PROCEDURE: Right hip hemiarthroplasty  SURGEON: Cato Mulligan, MD  ANESTHESIA: spinal  EBL: 200 cc  COMPONENTS:  Stryker - Accolade II Size 6 Stem Stryker - Unitrax 13mm head with +8 offset neck   INDICATIONS: Robert Burch is a 86 y.o. male who sustained a displaced femoral neck fracture after a fall. Risks and benefits of hip hemiarthroplasty were explained to the patient and/or family. Risks include but are not limited to bleeding, infection, injury to tissues, nerves, vessels, periprosthetic infection, dislocation, limb length discrepancy and risks of anesthesia. The patient and/or family understands these risks, has completed an informed consent and wishes to proceed.   PROCEDURE:  The patient was identified in the preoperative holding area and the operative extremity was marked. The patient was then transferred to the operating room suite and mobilized from the hospital gurney to the operating room table. Anesthesia was administered without complication. The patient was then transitioned to a lateral position.  All bony prominences were padded per protocol.  An axillary roll was placed. Careful attention was paid to the contralateral side peroneal nerve, which was free from pressure with use of appropriate padding and blankets. A time-out was performed to confirm the patient's identity and the correct laterality of surgery. The patient was then prepped and draped in the usual sterile fashion. Appropriate pre-operative antibiotics were administered. Tranexamic acid was administered preoperatively.    An incision that centered on the posterior tip of the greater trochanter with a posterior curve was made. Dissection was carried down through the subcutaneous tissue.  Careful attention was made to maintain hemostasis using electrocautery.   Dissection brought [place] to the level of the deep fascia where the gluteus maximus muscle and proximal portion of the IT band were identified.  The proximal region of the IT band was incised in linear fashion and this incision was extended proximally in a curvilinear fashion to split the gluteus maximus muscle parallel to its fibers to minimize bleeding.  This was accomplished using a combination of bovie electrocautery as well as blunt dissection.  The trochanteric bursa was then visualized and dissected from anterior to posterior. A blunt homan retractor was placed beneath the abductors. The piriformis tendon and short external rotators were visualized. Bovie electrocautery was used to cut these with the capsule as one L-shaped flap. This was tagged at the corner with #5 Ethibond. At this point, the femoral neck fracture was visualized. An oscillating saw was used to make a new neck cut approximately 69mm above the lesser tuberosity with the use of a neck cut guide. The head was then freed from its remaining soft tissue attachments and measured. The head trial was then inserted into the acetabulum and the appropriate sized head was selected.    We then turned our attention to preparing the femoral canal. First, a box cut was performed utilizing the box osteotome. A canal finder was inserted by hand and sequential broaching was then performed. The calcar planer was inserted onto the broach and used to smooth the calcar appropriately.  A trial stem, neutral neck, and head were inserted into the acetabulum and placed through range of motion. Intraoperative radiographs were obtained to assess component position and leg length. The hip was again dislocated and the femoral trial components were removed.  The actual stem was inserted into the femoral canal and then driven onto the calcar. The trial  head was then again inserted on the femoral component and repeat radiographs were obtained. The trial head was then removed  and the permanent head was Morse tapered onto the femoral stem and then reduced into the acetabulum.    The hip stability and length were reassessed and found to be satisfactory.  The wound was then copiously irrigated with normal saline solution. The tagged sutures of the capsule and piriformis were sewn to the gluteus medius tendon. This adequately closed the hip capsule. The IT band and gluteus maximus fascia were then closed with 0-Vicryl in a running, locked fashion. A mixture of Exparil and bupivicaine was administered.  The subdermal layer was closed with 2-0 Vicryl in a buried interrupted fashion. Skin was approximated with staples.  The wound was then covered a Honeycomb dressing.  An abduction pillow was placed. The patient was mobilized from the lateral position back to supine on the operating room table and then awakened from anesthesia without complication.  POSTOPERATIVE PLAN: The patient will be WBAT on operative extremity. For DVT ppx, resume home Coumadin with Lovenox 40mg /day bridging until INR therapeutic. Ancef x 24 hours. PT/OT on POD#1. Posterior hip precautions.

## 2020-03-17 NOTE — Progress Notes (Signed)
Report given to and care handed over to [place], [place] at this time.

## 2020-03-17 NOTE — Anesthesia Preprocedure Evaluation (Signed)
Anesthesia Evaluation  Patient identified by MRN, date of birth, ID band Patient awake    Reviewed: Allergy & Precautions, H&P , NPO status , Patient's Chart, lab work & pertinent test results, reviewed documented beta blocker date and time   History of Anesthesia Complications Negative for: history of anesthetic complications  Airway Mallampati: III  TM Distance: >3 FB Neck ROM: limited  Mouth opening: Limited Mouth Opening  Dental no notable dental hx. (+) Implants, Dental Advidsory Given, Teeth Intact, Chipped   Pulmonary neg pulmonary ROS,    Pulmonary exam normal breath sounds clear to auscultation       Cardiovascular Exercise Tolerance: Good hypertension, (-) angina+ CAD  (-) Past MI and (-) Cardiac Stents + dysrhythmias Atrial Fibrillation (-) Valvular Problems/Murmurs Rhythm:regular Rate:Normal     Neuro/Psych PSYCHIATRIC DISORDERS Anxiety negative neurological ROS     GI/Hepatic Neg liver ROS, GERD  ,  Endo/Other  negative endocrine ROS  Renal/GU negative Renal ROS  negative genitourinary   Musculoskeletal   Abdominal   Peds  Hematology negative hematology ROS (+)   Anesthesia Other Findings Past Medical History: No date: Atypical chest pain     Comment:  a. Nonobstructive, very minor irregs by cath 2007;  b.               06/2009 Ex MV: small area of apical lateral ischemia;  c.               06/2009 Cath: essentially normal;  d. 04/2014 Lexiscan MV:               EF 59%, no ischemia/infarct->low risk. No date: Cancer ([place]) No date: Chronic anticoagulation     Comment:  a. Chronic Coumadin No date: Chronic anxiety No date: Diverticulosis No date: GERD (gastroesophageal reflux disease) No date: Hypertension No date: IBS (irritable bowel syndrome) No date: Nonischemic cardiomyopathy ([place])     Comment:  a. EF 45% by echo 2010;  b. 05/2012 Echo: EF 55-65%, no               rwma, Gr 1 DD, mild AI/MR,  mildly dil LA. No date: Paralyzed vocal cords No date: Paroxysmal atrial fibrillation ([facility])     Comment:  a. Treated with flecainide No date: Proteinuria     Comment:  History of   Reproductive/Obstetrics negative OB ROS                             Anesthesia Physical Anesthesia Plan  ASA: II  Anesthesia Plan: General   Post-op Pain Management:    Induction: Intravenous  PONV Risk Score and Plan: 2 and Dexamethasone, Ondansetron and Treatment may vary due to age or medical condition  Airway Management Planned: Oral ETT  Additional Equipment:   Intra-op Plan:   Post-operative Plan: Extubation in OR  Informed Consent: I have reviewed the patients History and Physical, chart, labs and discussed the procedure including the risks, benefits and alternatives for the proposed anesthesia with the patient or authorized representative who has indicated his/her understanding and acceptance.     Dental Advisory Given  Plan Discussed with: Anesthesiologist, CRNA and Surgeon  Anesthesia Plan Comments:         Anesthesia Quick Evaluation

## 2020-03-17 NOTE — Progress Notes (Signed)
Pt received post op back to room 237 and report taken from PACU nurses.  Pt alert and slightly sedated.  Wife and son at bedside.  Pt denies pain at this time.  Rt hip surg site w/honeycomb dressing intact.  Abduction pillow in place b/t legs.  Foley draining clear, yellow urine.  Ice pack to surg site.  NS infusing.  Care plan for nightshift discussed w/ pt and family concerning activity, diet, and medications.  All questions answered.  NSR on the monitor.

## 2020-03-17 NOTE — Progress Notes (Signed)
[facility] Progress Note  Patient: Robert Burch    [id number]  DOA: 03/16/2020     Date of Service: the patient was seen and examined on 03/17/2020  Chief Complaint  Patient presents with  . Hip Pain  . Fall   Brief hospital course: Past medical history of nonobstructive CAD, chronic A. fib on anticoagulation with Coumadin, GERD, HTN, anxiety.  Patient presented with a mechanical fall with right hip pain and found to have right transcervical femur fracture.  Received vitamin K 5 mg IV in the ER for reversal of his Coumadin and orthopedic was consulted and currently scheduled for right hip hemiarthroplasty. Currently further plan is monitor for preoperative stability and postoperative recovery..  Assessment and Plan: 1.  Closed right transcervical femur fracture Mechanical fall CT head and CT C-spine unremarkable. Pain control with medication right now. Schedule for right hip hemiarthroplasty today. PT OT postoperatively.  2. Preoperative medical evaluation No Coronary revascularization/CVA within 5 years. No Recent stress test. Can Climb flight of stair, participates in recreational activity,does household chores. No Prior adverse event with anesthesia. No prior surgery No Alcohol use, drug use.  A) Cardiac risk: Based on RCRI the patient is a low-intermediate(due to age) risk for adverse Cardiac outcome from surgery. Estimated Risk Probability for Perioperative Myocardial Infarction or Cardiac Arrest: 0.2 %  Lyndel Safe et al.) Recommend no work up.   B) Pulmonary risk: Recommend optimization of lung function with use of incentive spirometry and good pulmunary toilet.  C) Bleeding risk Patient is on chronic anticoagulation with Coumadin. INR is therapeutic at the time of admission. Received vitamin K IV 5 mg x 1. INR 1.6 right now. Recheck INR trending down to 1.5 Appreciate orthopedic assistance.  3.  Essential hypertension. Isolated supine  hypertension Paroxysmal A. fib Follows up with [facility]. Currently normal sinus rhythm. Patient is on Cardizem for supine hypertension nightly Uses flecainide as needed (not 50 mg twice daily) On chronic anticoagulation with Coumadin which is currently on hold for the surgery and will resume postop.  4.  GERD Continue PPI, Carafate, H2 blocker  5.  Anxiety Continue current regimen  6.  Overactive bladder BPH On Myrbetriq.  Which is currently continued.  Monitor for urinary retention postop. Also on Flomax.  Uses saw palmetto as well.  Diet: NPO DVT Prophylaxis: Therapeutic Anticoagulation with warfarin   Advance goals of care discussion: Full code  Family Communication: no family was present at bedside, at the time of interview.   Disposition:  Pt is from home, admitted with transcervical femur fracture secondary to mechanical fall, still has surgical requirement, which precludes a safe discharge. Discharge to be determined , when medically ready.  Subjective: pain well controlled and no acute complains. No feer no chills.  Physical Exam: General:  alert oriented to time, place, and person.  Appear in mild distress, affect appropriate Eyes: PERRL ENT: Oral Mucosa Clear, moist  Neck: no JVD,  Cardiovascular: S1 and S2 Present, no Murmur,  Respiratory: good respiratory effort, Bilateral Air entry equal and Decreased, no Crackles, no wheezes Abdomen: Bowel Sound present, Soft and no tenderness,  Skin: no rash Extremities: no Pedal edema, no calf tenderness Neurologic: without any new focal findings  Gait not checked due to patient safety concerns  Vitals:   03/16/20 1600 03/16/20 2008 03/16/20 2057 03/17/20 0415  BP:  (!) 170/82 (!) 156/88 122/74  Pulse:  77 76 62  Resp:  16 20 20   Temp:   98 F (  36.7 C) 98.8 F (37.1 C)  TempSrc:   Oral Oral  SpO2:  96% 96% 91%  Weight: 87.1 kg  88.5 kg 87 kg  Height: 5\' 10"  (1.778 m)       Intake/Output Summary (Last  24 hours) at 03/17/2020 0743 Last data filed at 03/17/2020 0529 Gross per 24 hour  Intake 248.01 ml  Output 650 ml  Net -401.99 ml   Filed Weights   03/16/20 1600 03/16/20 2057 03/17/20 0415  Weight: 87.1 kg 88.5 kg 87 kg    Data Reviewed: I have personally reviewed and interpreted daily labs, tele strips, imagings as discussed above. I reviewed all nursing notes, pharmacy notes, vitals, pertinent old records I have discussed plan of care as described above with RN and patient/family.  CBC: Recent Labs  Lab 03/16/20 1612 03/17/20 0628  WBC 6.6 8.7  NEUTROABS 4.3  --   HGB 13.1 12.5*  HCT 39.0 38.2*  MCV 81.4 83.8  PLT 202 0000000   Basic Metabolic Panel: Recent Labs  Lab 03/16/20 1612 03/17/20 0628  NA 136 133*  K 4.3 4.4  CL 105 102  CO2 24 25  GLUCOSE 123* 105*  BUN 16 15  CREATININE 1.02 0.95  CALCIUM 8.7* 8.0*    Studies: DG Chest 1 View  Result Date: 03/16/2020 CLINICAL DATA:  Hip fracture. EXAM: CHEST  1 VIEW COMPARISON:  August 08, 2018 FINDINGS: The heart size is mildly enlarged. There is no pneumothorax or large pleural effusion. No focal infiltrate. No acute osseous abnormality involving the visualized portions of the thorax. IMPRESSION: No active disease. Electronically Signed   By: Constance Holster M.D.   On: 03/16/2020 17:22   CT Head Wo Contrast  Result Date: 03/16/2020 CLINICAL DATA:  Pain status post fall EXAM: CT HEAD WITHOUT CONTRAST CT CERVICAL SPINE WITHOUT CONTRAST TECHNIQUE: Multidetector CT imaging of the head and cervical spine was performed following the standard protocol without intravenous contrast. Multiplanar CT image reconstructions of the cervical spine were also generated. COMPARISON:  MRI cervical spine dated May 31, 2012. CT head dated May 17, 2011. FINDINGS: CT HEAD FINDINGS Brain: No evidence of acute infarction, hemorrhage, hydrocephalus, extra-axial collection or mass lesion/mass effect. Atrophy and chronic microvascular ischemic  changes are noted. Vascular: No hyperdense vessel or unexpected calcification. Skull: Normal. Negative for fracture or focal lesion. Sinuses/Orbits: There is a mucosal retention cyst in the left maxillary sinus. There is opacification of the left mastoid air cells. The remaining paranasal sinuses and mastoid air cells are essentially clear. Other: None. CT CERVICAL SPINE FINDINGS Alignment: Normal. Skull base and vertebrae: No acute fracture. No primary bone lesion or focal pathologic process. Soft tissues and spinal canal: No prevertebral fluid or swelling. No visible canal hematoma. Disc levels: Multilevel disc height loss is noted throughout the cervical spine, greatest at the C6-C7 level. Multilevel osseous neural foraminal narrowing is noted bilaterally. Upper chest: Negative. Other: None IMPRESSION: 1. No acute intracranial abnormality. 2. No acute cervical spine fracture. Electronically Signed   By: Constance Holster M.D.   On: 03/16/2020 17:01   CT Cervical Spine Wo Contrast  Result Date: 03/16/2020 CLINICAL DATA:  Pain status post fall EXAM: CT HEAD WITHOUT CONTRAST CT CERVICAL SPINE WITHOUT CONTRAST TECHNIQUE: Multidetector CT imaging of the head and cervical spine was performed following the standard protocol without intravenous contrast. Multiplanar CT image reconstructions of the cervical spine were also generated. COMPARISON:  MRI cervical spine dated May 31, 2012. CT head dated May 17, 2011.  FINDINGS: CT HEAD FINDINGS Brain: No evidence of acute infarction, hemorrhage, hydrocephalus, extra-axial collection or mass lesion/mass effect. Atrophy and chronic microvascular ischemic changes are noted. Vascular: No hyperdense vessel or unexpected calcification. Skull: Normal. Negative for fracture or focal lesion. Sinuses/Orbits: There is a mucosal retention cyst in the left maxillary sinus. There is opacification of the left mastoid air cells. The remaining paranasal sinuses and mastoid air cells are  essentially clear. Other: None. CT CERVICAL SPINE FINDINGS Alignment: Normal. Skull base and vertebrae: No acute fracture. No primary bone lesion or focal pathologic process. Soft tissues and spinal canal: No prevertebral fluid or swelling. No visible canal hematoma. Disc levels: Multilevel disc height loss is noted throughout the cervical spine, greatest at the C6-C7 level. Multilevel osseous neural foraminal narrowing is noted bilaterally. Upper chest: Negative. Other: None IMPRESSION: 1. No acute intracranial abnormality. 2. No acute cervical spine fracture. Electronically Signed   By: Constance Holster M.D.   On: 03/16/2020 17:01   DG Hip Unilat W or Wo Pelvis 2-3 Views Right  Result Date: 03/16/2020 CLINICAL DATA:  Pain status post fall EXAM: DG HIP (WITH OR WITHOUT PELVIS) 2-3V RIGHT COMPARISON:  None. FINDINGS: There is an acute displaced transcervical fracture of the proximal right femur. There is no dislocation. There is osteopenia. There is mild-to-moderate osteoarthritis involving both hips. Multiple calcifications project over the patient's pelvis and are favored to represent phleboliths. IMPRESSION: 1. Acute displaced transcervical fracture of the proximal right femur. 2. 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 Electronically Signed   By: Constance Holster M.D.   On: 03/16/2020 17:19    Scheduled Meds: . diltiazem  30 mg Oral Q8H  . finasteride  5 mg Oral Daily  . magnesium oxide  400 mg Oral BID  . mirabegron ER  25 mg Oral QHS  . multivitamin with minerals  1 tablet Oral Daily  . omega-3 acid ethyl esters  1 g Oral Daily  . pantoprazole  20 mg Oral Daily  . tamsulosin  0.4 mg Oral BID   Continuous Infusions: . sodium chloride 50 mL/hr at 03/17/20 0400  .  ceFAZolin (ANCEF) IV    . tranexamic acid     PRN Meds: nitroGLYCERIN, ondansetron **OR** ondansetron (ZOFRAN) IV, traMADol  Time spent: 35 minutes  Author: Berle Mull, MD [facility] 03/17/2020 7:43  AM  To reach On-call, see care teams to locate the attending and reach out to them via [email or web address]. If 7PM-7AM, please contact night-coverage If you still have difficulty reaching the attending provider, please page the [facility] (Director on Call) for [facility] on amion for assistance.

## 2020-03-17 NOTE — H&P (Signed)
Paper H&P to be scanned into permanent record. H&P reviewed. No significant changes noted.  

## 2020-03-18 LAB — CBC
HCT: 33.6 % — ABNORMAL LOW (ref 39.0–52.0)
Hemoglobin: 11.3 g/dL — ABNORMAL LOW (ref 13.0–17.0)
MCH: 27.7 pg (ref 26.0–34.0)
MCHC: 33.6 g/dL (ref 30.0–36.0)
MCV: 82.4 fL (ref 80.0–100.0)
Platelets: 170 10*3/uL (ref 150–400)
RBC: 4.08 MIL/uL — ABNORMAL LOW (ref 4.22–5.81)
RDW: 15.5 % (ref 11.5–15.5)
WBC: 9.3 10*3/uL (ref 4.0–10.5)
nRBC: 0 % (ref 0.0–0.2)

## 2020-03-18 LAB — BASIC METABOLIC PANEL
Anion gap: 5 (ref 5–15)
BUN: 16 mg/dL (ref 8–23)
CO2: 25 mmol/L (ref 22–32)
Calcium: 7.8 mg/dL — ABNORMAL LOW (ref 8.9–10.3)
Chloride: 102 mmol/L (ref 98–111)
Creatinine, Ser: 1.05 mg/dL (ref 0.61–1.24)
GFR calc Af Amer: >60 mL/min (ref >60)
GFR calc non Af Amer: >60 mL/min (ref >60)
Glucose, Bld: 166 mg/dL — ABNORMAL HIGH (ref 70–99)
Potassium: 4.7 mmol/L (ref 3.5–5.1)
Sodium: 132 mmol/L — ABNORMAL LOW (ref 135–145)

## 2020-03-18 LAB — PROTIME-INR
INR: 1.4 — ABNORMAL HIGH (ref 0.8–1.2)
Prothrombin Time: 17.1 seconds — ABNORMAL HIGH (ref 11.4–15.2)

## 2020-03-18 NOTE — Evaluation (Signed)
Occupational Therapy Evaluation Patient Details Name: Robert Burch MRN: [id number] DOB: August 15, 1933 Today's Date: 03/18/2020    History of Present Illness presented to ER secondary to mechanical fall in home environment with acute onset of R hip pain; admitted for management of R intertrochanteric hip fracture, s/p R hip hemiarthroplasty (3/28), WBAT, posterior THPs.   Clinical Impression   Pt seen for OT evaluation this date, POD#1 from above surgery. Spouse present and engaged throughout session. Pt was independent in all ADL and mobility prior to surgery. He does endorse having had a couple falls in the past 6 months but does not report any sort of common thread for how each occurred. Pt is eager to return to PLOF with less pain and improved safety and independence. Pt currently requires minimal assist for LB dressing and bathing while in seated position due to pain and limited AROM of R hip. Pt able to recall 1/3 posterior total hip precautions at start of session and unable to verbalize how to implement during ADL and mobility. Pt/spouse instructed in posterior total hip precautions and how to implement, self care skills, falls prevention strategies, home/routines modifications, DME/AE for LB bathing and dressing tasks, and car transfer techniques. Pt required CGA for STS transfers from recliner to/from [facility] for toileting task. Min A for pericare hygiene thoroughness after BM. Pt endorsed mild lightheadedness with standing which resolved quickly once seated again. RN notified. Pt demo'd decreased STM and immediate and delayed recall with precautions, requiring additional verbal instruction and visual demonstration with tactile and verbal cues while performing. At end of session, pt able to recall 2/3 posterior total hip precautions requiring minimal verbal cues to recall the 3rd. Pt would benefit from additional instruction in self care skills and techniques to help maintain precautions with  or without assistive devices to support recall and carryover prior to discharge. Recommend HHOT and initial 24/7 supervision/assist from family upon discharge to maximize safety and recovery.    Follow Up Recommendations  Home health OT;Supervision/Assistance - 24 hour    Equipment Recommendations  None recommended by OT    Recommendations for Other Services       Precautions / Restrictions Precautions Precautions: Fall;Posterior Hip Precaution Booklet Issued: Yes (comment) Restrictions Weight Bearing Restrictions: Yes RLE Weight Bearing: Weight bearing as tolerated      Mobility Bed Mobility               General bed mobility comments: deferred, up in recliner at start and end of session  Transfers Overall transfer level: Needs assistance Equipment used: Rolling walker (2 wheeled) Transfers: Sit to/from Stand Sit to Stand: Min guard         General transfer comment: visual demo/verbal instruction provided for sequencing prior to attempts and CGA with verbal and tactile cues provided during attempts    Balance Overall balance assessment: Needs assistance Sitting-balance support: No upper extremity supported;Feet supported Sitting balance-Leahy Scale: Good     Standing balance support: Bilateral upper extremity supported Standing balance-Leahy Scale: Fair                             ADL either performed or assessed with clinical judgement   ADL Overall ADL's : Needs assistance/impaired                                       General  ADL Comments: CGA for ADL transfers, VC for sequencing/hand placement, Min A for LB ADL and pericare (spouse able to assist)     Vision Patient Visual Report: No change from baseline       Perception     Praxis      Pertinent Vitals/Pain Pain Assessment: Faces Faces Pain Scale: Hurts a little bit Pain Location: R hip Pain Descriptors / Indicators: Aching;Grimacing;Guarding Pain  Intervention(s): Limited activity within patient's tolerance;Monitored during session;Repositioned     Hand Dominance     Extremity/Trunk Assessment Upper Extremity Assessment Upper Extremity Assessment: Overall WFL for tasks assessed   Lower Extremity Assessment Lower Extremity Assessment: Defer to PT evaluation;RLE deficits/detail RLE Deficits / Details: expected post-op strength./ROM deficits       Communication Communication Communication: No difficulties   Cognition Arousal/Alertness: Awake/alert Behavior During Therapy: WFL for tasks assessed/performed Overall Cognitive Status: Within Functional Limits for tasks assessed                                 General Comments: cognition grossly within functional limits, however pt demo'd slight difficulty with STM (mistaking OT for PT who came earlier, difficulty with recall of precautions) and sequencing of events; was able to demo improved performance with provided with cues for sequencing and safety   General Comments       Exercises Other Exercises Other Exercises: Pt/spouse educated in posterior THPs, falls prevention, home/routines modifications, AE/DME for ADL, and how to maintain THPs during ADL and IADL tasks with verbal instruction, visual demonstration, and return demo. Handout provided to support recall and carryover.   Shoulder Instructions      Home Living Family/patient expects to be discharged to:: Private residence Living Arrangements: Spouse/significant other Available Help at Discharge: Family Type of Home: House Home Access: Stairs to enter Technical brewer of Steps: 3 Entrance Stairs-Rails: Right Home Layout: One level     Bathroom Shower/Tub: Occupational psychologist: Handicapped height     Home Equipment: Bedside commode;Shower seat;Tub bench;Wheelchair - Scientist, physiological: Reacher;Long-handled Conservation officer, historic buildings        Prior  Functioning/Environment Level of Independence: Independent        Comments: Indep with ADLs, household and community mobilization without assist device; does endorse approx 2-3 falls within previous six months        OT Problem List: Decreased strength;Pain;Decreased range of motion;Decreased safety awareness;Impaired balance (sitting and/or standing);Decreased knowledge of use of DME or AE;Decreased knowledge of precautions      OT Treatment/Interventions: Self-care/ADL training;Therapeutic exercise;Therapeutic activities;DME and/or AE instruction;Patient/family education;Balance training    OT Goals(Current goals can be found in the care plan section) Acute Rehab OT Goals Patient Stated Goal: return to PLOF OT Goal Formulation: With patient/family Time For Goal Achievement: 04/01/20 Potential to Achieve Goals: Good ADL Goals Pt Will Perform Lower Body Dressing: with caregiver independent in assisting;with adaptive equipment;sit to/from stand(maintaining posterior THPs) Pt Will Transfer to Toilet: with supervision;ambulating(BSC over toilet, LRAD for amb, maintaining posterior THPs) Pt Will Perform Toileting - Clothing Manipulation and hygiene: sitting/lateral leans;with modified independence Additional ADL Goal #1: Pt will independently verbalize 3/3 posterior THPs and how to maintain during ADL transfers and LB ADL tasks.  OT Frequency: Min 2X/week   Barriers to D/C:            Co-evaluation              AM-PAC OT "6  Clicks" Daily Activity     Outcome Measure Help from another person eating meals?: None Help from another person taking care of personal grooming?: None Help from another person toileting, which includes using toliet, bedpan, or urinal?: A Little Help from another person bathing (including washing, rinsing, drying)?: A Little Help from another person to put on and taking off regular upper body clothing?: None Help from another person to put on and taking  off regular lower body clothing?: A Little 6 Click Score: 21   End of Session Equipment Utilized During Treatment: Gait belt;Rolling walker Nurse Communication: Other (comment)  Activity Tolerance: Patient tolerated treatment well Patient left: in chair;with call bell/phone within reach;with family/visitor present(RN aware no chair alarm box; chair alarm in recliner)  OT Visit Diagnosis: Other abnormalities of gait and mobility (R26.89);Repeated falls (R29.6);Muscle weakness (generalized) (M62.81);Pain Pain - Right/Left: Right Pain - part of body: Hip                Time: 1010-1114 OT Time Calculation (min): 64 min Charges:  OT General Charges $OT Visit: 1 Visit OT Evaluation $OT Eval Moderate Complexity: 1 Mod OT Treatments $Self Care/Home Management : 38-52 mins $Therapeutic Activity: 8-22 mins  Jeni Salles, MPH, MS, OTR/L ascom [phone] 03/18/20, 1:32 PM

## 2020-03-18 NOTE — Progress Notes (Signed)
Physical Therapy Treatment Patient Details Name: Robert Burch MRN: [id number] DOB: [birth year] Today's Date: 03/18/2020    History of Present Illness presented to ER secondary to mechanical fall in home environment with acute onset of R hip pain; admitted for management of R intertrochanteric hip fracture, s/p R hip hemiarthroplasty (3/28), WBAT, posterior THPs.    PT Comments    Mild reports of dizziness with transition to upright; BP slightly hypotensive, but stabilizes with standing and gait.  Marked improvement in activity tolerance and overall gait distance this date, completing full lap around nursing station (single standing rest period), cga/min assist for safety. Mild difficulty noted with recall and integration of new information (I.e., THPs, transfer sequencing/safety)    Follow Up Recommendations  Home health PT     Equipment Recommendations  Rolling walker with 5" wheels;3in1 (PT)    Recommendations for Other Services       Precautions / Restrictions Precautions Precautions: Fall;Posterior Hip Precaution Booklet Issued: Yes (comment) Restrictions Weight Bearing Restrictions: Yes RLE Weight Bearing: Weight bearing as tolerated    Mobility  Bed Mobility               General bed mobility comments: seated in recliner beginning/end of treatment session  Transfers Overall transfer level: Needs assistance Equipment used: Rolling walker (2 wheeled) Transfers: Sit to/from Stand Sit to Stand: Min guard         General transfer comment: limited carry-over of hand placement  Ambulation/Gait Ambulation/Gait assistance: Min guard;Min assist Gait Distance (Feet): 200 Feet Assistive device: Rolling walker (2 wheeled)       General Gait Details: reciprocal stepping pattern, decreased stance time/weight acceptance R LE; mild R hip/knee flexion throughout gait cycle.  Fair cadence, mod reliance on RW   Stairs             Wheelchair  Mobility    Modified Rankin (Stroke Patients Only)       Balance Overall balance assessment: Needs assistance Sitting-balance support: No upper extremity supported;Feet supported Sitting balance-Leahy Scale: Good     Standing balance support: Bilateral upper extremity supported Standing balance-Leahy Scale: Fair                              Cognition Arousal/Alertness: Awake/alert Behavior During Therapy: WFL for tasks assessed/performed Overall Cognitive Status: Within Functional Limits for tasks assessed                                 General Comments: intermittent confusion to new, more complex information      Exercises Other Exercises Other Exercises: Sit/stand with RW, cga/min assist, x2 reps--consistent cuing and hand-over-hand assist for UE placement Other Exercises: Standing LE therex with RW, cga/min assist: heel raises, forward/backward stepping with RW to promote accommodation to upright before initiation of gait distance. Other Exercises: Pt/spouse educated in posterior THPs, falls prevention, home/routines modifications, AE/DME for ADL, and how to maintain THPs during ADL and IADL tasks with verbal instruction, visual demonstration, and return demo. Handout provided to support recall and carryover.    General Comments        Pertinent Vitals/Pain Pain Assessment: Faces Faces Pain Scale: Hurts even more Pain Location: R hip Pain Descriptors / Indicators: Aching;Grimacing;Guarding Pain Intervention(s): Limited activity within patient's tolerance;Monitored during session;Repositioned    Home Living Family/patient expects to be discharged to:: Private residence Living Arrangements: Spouse/significant other  Available Help at Discharge: Family Type of Home: House Home Access: Stairs to enter Entrance Stairs-Rails: Right Home Layout: One level Home Equipment: Bedside commode;Shower seat;Tub bench;Wheelchair - Child psychotherapist      Prior Function Level of Independence: Independent      Comments: Indep with ADLs, household and community mobilization without assist device; does endorse approx 2-3 falls within previous six months   PT Goals (current goals can now be found in the care plan section) Acute Rehab PT Goals Patient Stated Goal: return to PLOF PT Goal Formulation: With patient Time For Goal Achievement: 04/01/20 Potential to Achieve Goals: Good Progress towards PT goals: Progressing toward goals    Frequency    BID      PT Plan Current plan remains appropriate    Co-evaluation              AM-PAC PT "6 Clicks" Mobility   Outcome Measure  Help needed turning from your back to your side while in a flat bed without using bedrails?: A Little Help needed moving from lying on your back to sitting on the side of a flat bed without using bedrails?: A Little Help needed moving to and from a bed to a chair (including a wheelchair)?: A Little Help needed standing up from a chair using your arms (e.g., wheelchair or bedside chair)?: A Little Help needed to walk in hospital room?: A Little Help needed climbing 3-5 steps with a railing? : A Lot 6 Click Score: 17    End of Session Equipment Utilized During Treatment: Gait belt Activity Tolerance: Patient tolerated treatment well Patient left: in chair;with call bell/phone within reach;with nursing/sitter in room(patient preparing for transfer to ortho unit) Nurse Communication: Mobility status PT Visit Diagnosis: Muscle weakness (generalized) (M62.81);Difficulty in walking, not elsewhere classified (R26.2);Pain Pain - Right/Left: Right Pain - part of body: Hip     Time: 1344-1415 PT Time Calculation (min) (ACUTE ONLY): 31 min  Charges:  $Gait Training: 8-22 mins $Therapeutic Activity: 8-22 mins                      Philena Obey H. Owens Shark, PT, DPT, NCS 03/18/20, 2:25 PM [phone]

## 2020-03-18 NOTE — Progress Notes (Signed)
[facility] Progress Note  Patient: Robert Burch    [id number]  DOA: 03/16/2020     Date of Service: the patient was seen and examined on 03/18/2020  Chief Complaint  Patient presents with  . Hip Pain  . Fall   Brief hospital course: Past medical history of nonobstructive CAD, chronic A. fib on anticoagulation with Coumadin, GERD, HTN, anxiety.  Patient presented with a mechanical fall with right hip pain and found to have right transcervical femur fracture.  Received vitamin K 5 mg IV in the ER for reversal of his Coumadin and orthopedic was consulted and currently scheduled for right hip hemiarthroplasty. Currently further plan is monitor for preoperative stability and postoperative recovery..  Assessment and Plan: 1.  Closed right transcervical femur fracture Mechanical fall CT head and CT C-spine unremarkable. Pain control with medication right now. Underwent right hip hemiarthroplasty. Tolerated well. We will monitor.  PT OT recommends home health  2. Preoperative medical evaluation  Tolerated surgery very well.    3.  Essential hypertension. Isolated supine hypertension Paroxysmal A. fib Follows up with [facility]. Currently normal sinus rhythm. Patient is on Cardizem for supine hypertension nightly Uses flecainide as needed (not 50 mg twice daily) On chronic anticoagulation with Coumadin which is currently on hold for the surgery and will resume postop.  4.  GERD Continue PPI, Carafate, H2 blocker  5.  Anxiety Continue current regimen  6.  Overactive bladder BPH On Myrbetriq.  Which is currently continued.  Monitor for urinary retention postop. Also on Flomax.  Uses saw palmetto as well.  Diet: Cardiac diet DVT Prophylaxis: Therapeutic Anticoagulation with warfarin   Advance goals of care discussion: Full code  Family Communication: no family was present at bedside, at the time of interview.   Disposition:  Pt is from home, admitted  with transcervical femur fracture secondary to mechanical fall, still has surgical requirement, which precludes a safe discharge. Discharge to home with home health, when medically ready.  Subjective: Pain remains well controlled.  No nausea no vomiting.  No fever no chills.  Urinating well after removal of the Foley catheter.  Physical Exam: General:  alert oriented to time, place, and person.  Appear in mild distress, affect appropriate Eyes: PERRL ENT: Oral Mucosa Clear, moist  Neck: no JVD,  Cardiovascular: S1 and S2 Present, no Murmur,  Respiratory: good respiratory effort, Bilateral Air entry equal and Decreased, no Crackles, no wheezes Abdomen: Bowel Sound present, Soft and no tenderness,  Skin: no rash Extremities: no Pedal edema, no calf tenderness Neurologic: without any new focal findings  Gait not checked due to patient safety concerns  Vitals:   03/18/20 1202 03/18/20 1435 03/18/20 1834 03/18/20 1951  BP: (!) 101/59 118/60 123/66 (!) 119/56  Pulse: 62 62 64 64  Resp: 19 20 20 17   Temp: 98.2 F (36.8 C) (!) 97.5 F (36.4 C) 98.2 F (36.8 C) 98.5 F (36.9 C)  TempSrc: Oral Oral Oral Oral  SpO2: 95% 98% 97% 98%  Weight:      Height:        Intake/Output Summary (Last 24 hours) at 03/18/2020 2044 Last data filed at 03/18/2020 1018 Gross per 24 hour  Intake 1265 ml  Output 300 ml  Net 965 ml   Filed Weights   03/16/20 1600 03/16/20 2057 03/17/20 0415  Weight: 87.1 kg 88.5 kg 87 kg    Data Reviewed: I have personally reviewed and interpreted daily labs, tele strips, imagings as discussed  above. I reviewed all nursing notes, pharmacy notes, vitals, pertinent old records I have discussed plan of care as described above with RN and patient/family.  CBC: Recent Labs  Lab 03/16/20 1612 03/17/20 0628 03/18/20 0522  WBC 6.6 8.7 9.3  NEUTROABS 4.3  --   --   HGB 13.1 12.5* 11.3*  HCT 39.0 38.2* 33.6*  MCV 81.4 83.8 82.4  PLT 202 193 [id number]   Basic Metabolic  Panel: Recent Labs  Lab 03/16/20 1612 03/17/20 0628 03/18/20 0522  NA 136 133* 132*  K 4.3 4.4 4.7  CL 105 102 102  CO2 24 25 25   GLUCOSE 123* 105* 166*  BUN 16 15 16   CREATININE 1.02 0.95 1.05  CALCIUM 8.7* 8.0* 7.8*    Studies: No results found.  Scheduled Meds: . acetaminophen  1,000 mg Oral Q8H  . diltiazem  30 mg Oral QHS  . docusate sodium  100 mg Oral BID  . enoxaparin (LOVENOX) injection  40 mg Subcutaneous Q24H  . finasteride  5 mg Oral Daily  . magnesium oxide  400 mg Oral BID  . mirabegron ER  25 mg Oral QHS  . multivitamin with minerals  1 tablet Oral Daily  . omega-3 acid ethyl esters  1 g Oral Daily  . pantoprazole  20 mg Oral Daily  . tamsulosin  0.4 mg Oral BID  . warfarin  2.5 mg Oral q1800  . Warfarin - Pharmacist Dosing Inpatient   Does not apply q1800   Continuous Infusions:  PRN Meds: bisacodyl, HYDROmorphone (DILAUDID) injection, menthol-cetylpyridinium **OR** phenol, nitroGLYCERIN, ondansetron **OR** ondansetron (ZOFRAN) IV, oxyCODONE, oxyCODONE, senna-docusate, traMADol  Time spent: 35 minutes  Author: Berle Mull, MD [facility] 03/18/2020 8:44 PM  To reach On-call, see care teams to locate the attending and reach out to them via [email or web address]. If 7PM-7AM, please contact night-coverage If you still have difficulty reaching the attending provider, please page the [facility] (Director on Call) for [facility] on amion for assistance.

## 2020-03-18 NOTE — Progress Notes (Signed)
ANTICOAGULATION CONSULT NOTE - Initial Consult  Pharmacy Consult for Warfarin  Indication: atrial fibrillation  Allergies  Allergen Reactions  . Ciprofloxacin     Diarrhea and stomach pains  . Lanoxin [Digoxin]   . Lopressor [Metoprolol Tartrate]   . Sulfonamide Derivatives     Rash   . Verapamil     Patient Measurements: Height: 5\' 10"  (177.8 cm) Weight: 191 lb 12.8 oz (87 kg) IBW/kg (Calculated) : 73 Heparin Dosing Weight:   Vital Signs: Temp: 98.2 F (36.8 C) (03/29 0753) Temp Source: Oral (03/29 0753) BP: 145/68 (03/29 0753) Pulse Rate: 62 (03/29 0753)  Labs: Recent Labs    03/16/20 1612 03/16/20 1808 03/16/20 1808 03/17/20 0628 03/17/20 1016 03/18/20 0522  HGB 13.1  --    < > 12.5*  --  11.3*  HCT 39.0  --   --  38.2*  --  33.6*  PLT 202  --   --  193  --  170  APTT  --  44*  --   --   --   --   LABPROT  --  26.0*   < > 19.3* 18.4* 17.1*  INR  --  2.4*   < > 1.6* 1.5* 1.4*  CREATININE 1.02  --   --  0.95  --  1.05   < > = values in this interval not displayed.    Estimated Creatinine Clearance: 54.1 mL/min (by C-G formula based on SCr of 1.05 mg/dL).   Medical History: Past Medical History:  Diagnosis Date  . Atypical chest pain    a. Nonobstructive, very minor irregs by cath 2007;  b. 06/2009 Ex MV: small area of apical lateral ischemia;  c. 06/2009 Cath: essentially normal;  d. 04/2014 Lexiscan MV: EF 59%, no ischemia/infarct->low risk.  . Cancer ([place])   . Chronic anticoagulation    a. Chronic Coumadin  . Chronic anxiety   . Diverticulosis   . Dysrhythmia    atrial fib  . GERD (gastroesophageal reflux disease)   . Hypertension   . IBS (irritable bowel syndrome)   . Nonischemic cardiomyopathy ([place])    a. EF 45% by echo 2010;  b. 05/2012 Echo: EF 55-65%, no rwma, Gr 1 DD, mild AI/MR, mildly dil LA.  . Paralyzed vocal cords   . Paroxysmal atrial fibrillation ([place])    a. Treated with flecainide  . Proteinuria    History of    Medications:   Medications Prior to Admission  Medication Sig Dispense Refill Last Dose  . acidophilus (RISAQUAD) CAPS capsule Take 1 capsule by mouth daily.     . Cholecalciferol (VITAMIN D3) 400 UNITS CAPS Take 800 Units by mouth daily.      . Cobalamine Combinations (VITAMIN B12-FOLIC ACID) XX123456 MCG TABS Take 1 tablet by mouth daily.     . Coenzyme Q10 50 MG CAPS Take 50 mg by mouth 2 (two) times daily.      . Cranberry 500 MG CAPS Take 500 mg by mouth daily.     . diclofenac sodium (VOLTAREN) 1 % GEL Apply 2-4 g topically every 6 (six) hours as needed.    Unknown at PRN  . diltiazem (CARDIZEM) 30 MG tablet Take 1 tablet (30 mg) by mouth once daily at bedtime 30 tablet 11 03/15/2020 at 2100  . finasteride (PROSCAR) 5 MG tablet Take 1 tablet (5 mg total) by mouth daily. 30 tablet 11 03/16/2020 at 0800  . Flaxseed, Linseed, (FLAX SEED OIL) 1000 MG CAPS Take 1,000 mg  by mouth daily.      . flecainide (TAMBOCOR) 50 MG tablet Take 2 tablets (100 mg) by mouth twice daily as needed for recurrent atrial fibrillation 12 tablet 3 Unknown at PRN  . Ginger 500 MG CAPS Take 500 mg by mouth 2 (two) times daily.     . hydrocortisone (ANUSOL-HC) 25 MG suppository Place 25 mg rectally daily as needed for hemorrhoids or anal itching.   Unknown at PRN  . magnesium oxide (MAG-OX) 400 MG tablet TAKE 1 TABLET BY MOUTH TWICE A DAY (Patient taking differently: Take 400 mg by mouth 2 (two) times daily. ) 180 tablet 0 03/16/2020 at 0800  . mirabegron ER (MYRBETRIQ) 25 MG TB24 tablet Take 1 tablet (25 mg total) by mouth daily. (Patient taking differently: Take 25 mg by mouth at bedtime. ) 30 tablet 11 03/15/2020 at 2100  . Multiple Vitamin (MULTIVITAMIN) tablet Take 1 tablet by mouth daily.       . nitroGLYCERIN (NITROSTAT) 0.4 MG SL tablet PLACE 1 TABLET (0.4 MG TOTAL) UNDER THE TONGUE EVERY 5 (FIVE) MINUTES AS NEEDED FOR CHEST PAIN. 90 tablet 0 Unknown at PRN  . Omega-3 Fatty Acids (FISH OIL) 1000 MG CAPS Take 1,000 mg by mouth  daily.      . pantoprazole (PROTONIX) 20 MG tablet Take 20 mg by mouth daily.      . polyethylene glycol (MIRALAX / GLYCOLAX) packet Take 17 g by mouth daily as needed for mild constipation or moderate constipation.   Unknown at PRN  . saw palmetto 160 MG capsule Take 160 mg by mouth 2 (two) times daily.     . tamsulosin (FLOMAX) 0.4 MG CAPS capsule Take 1 capsule (0.4 mg total) by mouth daily after supper. (Patient taking differently: Take 0.4 mg by mouth 2 (two) times daily. ) 180 capsule 2 03/16/2020 at 0800  . traMADol (ULTRAM) 50 MG tablet Take 50 mg by mouth every 6 (six) hours as needed for moderate pain.   Unknown at PRN  . warfarin (COUMADIN) 2.5 MG tablet Take 2.5 mg by mouth every evening.    03/15/2020 at 1800    Assessment: Pharmacy consulted to dose warfarin in this 86 year old male S/P hip hemiarthroplasty on 3/28 after falling at home on 3/27.   Pt was warfarin 2.5 mg PO daily for Afib and was therapeutic on arrival .  Pt received Vit K 5 mg IV X 1 on 3/27 in preparation for surgery.    3/27: INR 2.4 (IV Vit K 5 mg x 1) 3/28: INR 1.5 3/29: INR 1.4  Goal of Therapy:  INR 2-3   Plan:  -Continue pt's home dose of warfarin 2.5 mg daily  -Will check INR daily while in-patient -LMWH 40 mg q24h started 3/29 for post-op DVT prophylaxis -Can consider heparin/LMWH bridge since pt received Vit K IV on 3/27 and will be a few days until INR is therapeutic again. Patient is in NSR at this time.    [place] Resident 03/18/2020,10:12 AM

## 2020-03-18 NOTE — TOC Initial Note (Signed)
Transition of Care [facility]) - Initial/Assessment Note    Patient Details  Name: Robert Burch MRN: [id number] Date of Birth: [birth year]  Transition of [facility]) CM/SW Contact:    Shelbie Ammons, RN Phone Number: 03/18/2020, 3:38 PM  Clinical Narrative:      RNCM assessed patient in room, sitting in recliner with wife and son present. Patient reports to feeling well and he has worked with both PT and OT today. Patient lives in single level home with 3 steps to enter with his wife. They have equipment already including BSC and rolling walker, and he doesn't feel he will need any further equipment. RNCM discussed with patient any home health services they have had in the past and patient reports that he has not but that his wife has, they however can't remember who they had in the past. Patient is agreeable to whomever will except his insurance. RNCM reached out to [facility] who will accept for home health PT/OT. RNCM will follow for any further needs.     Expected Discharge Plan: [place] Barriers to Discharge: No Barriers Identified   Patient Goals and CMS Choice        Expected Discharge Plan and Services Expected Discharge Plan: [place]   Discharge Planning Services: CM Consult Post Acute Care Choice: [place] arrangements for the past 2 months: Single Family Home                           HH Arranged: PT, OT [place] Agency: [facility] (formerly [employer]) Date [place]: 03/18/20 Time [place]: 1538 Representative spoke with at [place]: [place] Arrangements/Services Living arrangements for the past 2 months: [place] Lives with:: Spouse Patient language and need for interpreter reviewed:: Yes Do you feel safe going back to the place where you live?: Yes      Need for Family Participation in Patient Care: Yes (Comment) Care giver support system in place?: Yes  (comment)   Criminal Activity/Legal Involvement Pertinent to Current Situation/Hospitalization: No - Comment as needed  Activities of Daily Living Home Assistive Devices/Equipment: None ADL Screening (condition at time of admission) Patient's cognitive ability adequate to safely complete daily activities?: Yes Is the patient deaf or have difficulty hearing?: No Does the patient have difficulty seeing, even when wearing glasses/contacts?: No Does the patient have difficulty concentrating, remembering, or making decisions?: No Patient able to express need for assistance with ADLs?: Yes Does the patient have difficulty dressing or bathing?: No Independently performs ADLs?: Yes (appropriate for developmental age) Does the patient have difficulty walking or climbing stairs?: No Weakness of Legs: None Weakness of Arms/Hands: None  Permission Sought/Granted                  Emotional Assessment Appearance:: Appears stated age Attitude/Demeanor/Rapport: Engaged Affect (typically observed): Appropriate Orientation: : Oriented to Self, Oriented to Place, Oriented to  Time, Oriented to Situation Alcohol / Substance Use: Not Applicable Psych Involvement: No (comment)  Admission diagnosis:  Closed right femoral fracture (HCC) [S72.91XA] Closed fracture of right hip, initial encounter ([place]) [S72.001A] Patient Active Problem List   Diagnosis Date Noted  . Closed right femoral fracture ([place]) 03/16/2020  . PAC (premature atrial contraction) 07/06/2019  . PVC (premature ventricular contraction) 07/06/2019  . Anemia 10/12/2018  . Sinus bradycardia 09/23/2018  . Coronary artery calcification seen on CAT scan  11/06/2017  . Diverticulum of bladder 11/26/2015  . Gross hematuria 10/29/2015  . Hyperlipidemia 04/22/2015  . Cervical radiculitis 05/22/2014  . DDD (degenerative disc disease), lumbar 05/22/2014  . Lumbar stenosis with neurogenic claudication 05/22/2014  . Paroxysmal atrial  fibrillation (HCC)   . Atypical chest pain   . GERD (gastroesophageal reflux disease)   . Elevated prostate specific antigen (PSA) 12/06/2013  . Anxiety 10/17/2013  . Benign localized hyperplasia of prostate with urinary obstruction 08/31/2013  . Chronic prostatitis 08/31/2013  . ED (erectile dysfunction) of organic origin 08/31/2013  . Incomplete emptying of bladder 08/31/2013  . Syncope and collapse 12/07/2011  . Snoring 07/02/2011  . Abnormal respiratory rate 07/02/2011  . Essential hypertension 07/22/2009   PCP:  [name], MD Pharmacy:   [id number] - , [place] [address] [place] [address] Phone: [phone] Fax: [phone]     Social Determinants of Health (SDOH) Interventions    Readmission Risk Interventions No flowsheet data found.

## 2020-03-18 NOTE — Evaluation (Signed)
Physical Therapy Evaluation Patient Details Name: Robert Burch MRN: [id number] DOB: 10-22-1933 Today's Date: 03/18/2020   History of Present Illness  presented to ER secondary to mechanical fall in home environment with acute onset of R hip pain; admitted for management of R intertrochanteric hip fracture, s/p R hip hemiarthroplasty (3/28), WBAT, posterior THPs.  Clinical Impression  Upon evaluation, patient alert and oriented; follows commands and demonstrates good effort with mobility tasks.  Rates R hip pain 4/10, mild grimacing with functional activities.  Demonstrates R LE strength and least 3-/5, limited by pain; ROM grossly [facility] for basic transfers and mobility, limited by THPs.  Currently requiring min assist for bed mobility; min assist for sit/stand, basic transfers and gait (5') with RW, min assist.  Demonstrates 3-point, step to gait pattern with decreased stance time/weight acceptance R LE; min cuing for sliding vs picking up RW.  Generally choppy in movement.  Anticipate improvement as comfort/confidence in movement improves. Would benefit from skilled PT to address above deficits and promote optimal return to PLOF.; Recommend transition to HHPT upon discharge from acute hospitalization.     Follow Up Recommendations Home health PT    Equipment Recommendations  Rolling walker with 5" wheels;3in1 (PT)    Recommendations for Other Services       Precautions / Restrictions Precautions Precautions: Fall;Posterior Hip Restrictions Weight Bearing Restrictions: Yes RLE Weight Bearing: Weight bearing as tolerated      Mobility  Bed Mobility Overal bed mobility: Needs Assistance Bed Mobility: Supine to Sit     Supine to sit: Min assist     General bed mobility comments: assist for position/protection of R LE  Transfers Overall transfer level: Needs assistance Equipment used: Rolling walker (2 wheeled) Transfers: Sit to/from Stand Sit to Stand: Min assist         General transfer comment: increased time, heavy use of UEs for lift off and stabilization; decreased active use of R LE  Ambulation/Gait Ambulation/Gait assistance: Min assist Gait Distance (Feet): 5 Feet Assistive device: Rolling walker (2 wheeled)       General Gait Details: 3-point, step to gait pattern with decreased stance time/weight acceptance R LE; min cuing for sliding vs picking up RW.  Generally choppy in movement.  Stairs            Wheelchair Mobility    Modified Rankin (Stroke Patients Only)       Balance Overall balance assessment: Needs assistance Sitting-balance support: No upper extremity supported;Feet supported Sitting balance-Leahy Scale: Good     Standing balance support: Bilateral upper extremity supported Standing balance-Leahy Scale: Fair                               Pertinent Vitals/Pain Pain Assessment: Faces Faces Pain Scale: Hurts little more Pain Location: R hip Pain Descriptors / Indicators: Aching;Grimacing;Guarding Pain Intervention(s): Limited activity within patient's tolerance;Monitored during session;Premedicated before session;Repositioned    Home Living Family/patient expects to be discharged to:: Private residence Living Arrangements: Spouse/significant other Available Help at Discharge: Family Type of Home: House Home Access: Stairs to enter Entrance Stairs-Rails: Right Entrance Stairs-Number of Steps: 3 Home Layout: One level Home Equipment: None      Prior Function Level of Independence: Independent         Comments: Indep with ADLs, household and community mobilization without assist device; does endorse approx 2-3 falls within previous six months     Hand Dominance  Extremity/Trunk Assessment   Upper Extremity Assessment Upper Extremity Assessment: Overall WFL for tasks assessed    Lower Extremity Assessment Lower Extremity Assessment: (R hip grossly 3-/5, limited by  pain; otherwise, LEs grossly WFL, at least 4+/5)       Communication   Communication: No difficulties  Cognition Arousal/Alertness: Awake/alert Behavior During Therapy: WFL for tasks assessed/performed Overall Cognitive Status: Within Functional Limits for tasks assessed                                        General Comments      Exercises Other Exercises Other Exercises: Educated in [place] precautions and THPs; patient voiced understanding, but requires consistent cuing/assist to maintain with functional activities. Other Exercises: Seated R LE therex, 1x8, active ROM: ankle pumps, LAQs.  Good isolated strength and muscular control   Assessment/Plan    PT Assessment Patient needs continued PT services  PT Problem List Decreased strength;Decreased range of motion;Decreased activity tolerance;Decreased balance;Decreased mobility;Decreased coordination;Decreased knowledge of use of DME;Decreased knowledge of precautions;Decreased safety awareness;Decreased skin integrity;Pain       PT Treatment Interventions DME instruction;Gait training;Stair training;Functional mobility training;Therapeutic activities;Therapeutic exercise;Balance training;Patient/family education    PT Goals (Current goals can be found in the Care Plan section)  Acute Rehab PT Goals Patient Stated Goal: to be able to kneels on my knees again PT Goal Formulation: With patient Time For Goal Achievement: 04/01/20 Potential to Achieve Goals: Good    Frequency BID   Barriers to discharge        Co-evaluation               AM-PAC PT "6 Clicks" Mobility  Outcome Measure Help needed turning from your back to your side while in a flat bed without using bedrails?: A Little Help needed moving from lying on your back to sitting on the side of a flat bed without using bedrails?: A Little Help needed moving to and from a bed to a chair (including a wheelchair)?: A Little Help needed  standing up from a chair using your arms (e.g., wheelchair or bedside chair)?: A Little Help needed to walk in hospital room?: A Little Help needed climbing 3-5 steps with a railing? : A Lot 6 Click Score: 17    End of Session Equipment Utilized During Treatment: Gait belt Activity Tolerance: Patient tolerated treatment well Patient left: in chair;with call bell/phone within reach(pad in place, box not available; RN to locate and place) Nurse Communication: Mobility status PT Visit Diagnosis: Muscle weakness (generalized) (M62.81);Difficulty in walking, not elsewhere classified (R26.2);Pain Pain - Right/Left: Right Pain - part of body: Hip    Time: [id number] PT Time Calculation (min) (ACUTE ONLY): 23 min   Charges:   PT Evaluation $PT Eval Moderate Complexity: 1 Mod PT Treatments $Therapeutic Activity: 8-22 mins       Mirca Yale H. Owens Shark, PT, DPT, NCS 03/18/20, 9:16 AM [phone]

## 2020-03-19 LAB — PROTIME-INR
INR: 1.2 (ref 0.8–1.2)
Prothrombin Time: 15.5 seconds — ABNORMAL HIGH (ref 11.4–15.2)

## 2020-03-19 LAB — SURGICAL PATHOLOGY

## 2020-03-19 LAB — BASIC METABOLIC PANEL
Anion gap: 6 (ref 5–15)
BUN: 23 mg/dL (ref 8–23)
CO2: 25 mmol/L (ref 22–32)
Calcium: 7.9 mg/dL — ABNORMAL LOW (ref 8.9–10.3)
Chloride: 101 mmol/L (ref 98–111)
Creatinine, Ser: 1.23 mg/dL (ref 0.61–1.24)
GFR calc Af Amer: >60 mL/min (ref >60)
GFR calc non Af Amer: 54 mL/min — ABNORMAL LOW (ref >60)
Glucose, Bld: 103 mg/dL — ABNORMAL HIGH (ref 70–99)
Potassium: 4.9 mmol/L (ref 3.5–5.1)
Sodium: 132 mmol/L — ABNORMAL LOW (ref 135–145)

## 2020-03-19 LAB — CBC
HCT: 30.8 % — ABNORMAL LOW (ref 39.0–52.0)
Hemoglobin: 10.5 g/dL — ABNORMAL LOW (ref 13.0–17.0)
MCH: 27.8 pg (ref 26.0–34.0)
MCHC: 34.1 g/dL (ref 30.0–36.0)
MCV: 81.5 fL (ref 80.0–100.0)
Platelets: 174 10*3/uL (ref 150–400)
RBC: 3.78 MIL/uL — ABNORMAL LOW (ref 4.22–5.81)
RDW: 15.8 % — ABNORMAL HIGH (ref 11.5–15.5)
WBC: 8.1 10*3/uL (ref 4.0–10.5)
nRBC: 0 % (ref 0.0–0.2)

## 2020-03-19 MED ORDER — ENOXAPARIN (LOVENOX) PATIENT EDUCATION KIT
PACK | Freq: Once | Status: AC
  Filled 2020-03-19: qty 1

## 2020-03-19 MED ORDER — WARFARIN SODIUM 2.5 MG PO TABS
3.5000 mg | ORAL_TABLET | Freq: Once | ORAL | Status: DC
  Filled 2020-03-19: qty 1

## 2020-03-19 MED ORDER — WARFARIN SODIUM 2.5 MG PO TABS: 2.5000 mg | ORAL_TABLET | Freq: Every evening | ORAL | Status: DC

## 2020-03-19 MED ORDER — DOCUSATE SODIUM 100 MG PO CAPS: 100.0000 mg | ORAL_CAPSULE | Freq: Two times a day (BID) | ORAL | 0 refills | Status: DC

## 2020-03-19 MED ORDER — TRAMADOL HCL 50 MG PO TABS: 50.0000 mg | ORAL_TABLET | Freq: Four times a day (QID) | ORAL | 0 refills | Status: DC | PRN

## 2020-03-19 MED ORDER — WARFARIN SODIUM 3 MG PO TABS: 3.0000 mg | ORAL_TABLET | Freq: Once | ORAL | Status: DC

## 2020-03-19 MED ORDER — WARFARIN SODIUM 1 MG PO TABS: 1.0000 mg | ORAL_TABLET | Freq: Every day | ORAL | 0 refills | Status: DC

## 2020-03-19 MED ORDER — ENOXAPARIN SODIUM 40 MG/0.4ML ~~LOC~~ SOLN: 40.0000 mg | SUBCUTANEOUS | 0 refills | Status: DC

## 2020-03-19 MED ORDER — OXYCODONE HCL 5 MG PO TABS: 2.5000 mg | ORAL_TABLET | ORAL | 0 refills | Status: DC | PRN

## 2020-03-19 NOTE — Progress Notes (Signed)
Education on DC was done with the pt, wife and son. Prescription, urinal and gait belt was given.

## 2020-03-19 NOTE — Anesthesia Postprocedure Evaluation (Signed)
Anesthesia Post Note  Patient: Leart Duke Willets  Procedure(s) Performed: ARTHROPLASTY BIPOLAR HIP (HEMIARTHROPLASTY) (Right Hip)  Patient location during evaluation: PACU Anesthesia Type: General Level of consciousness: awake and alert Pain management: pain level controlled Vital Signs Assessment: post-procedure vital signs reviewed and stable Respiratory status: spontaneous breathing, nonlabored ventilation, respiratory function stable and patient connected to nasal cannula oxygen Cardiovascular status: blood pressure returned to baseline and stable Postop Assessment: no apparent nausea or vomiting Anesthetic complications: no     Last Vitals:  Vitals:   03/19/20 0816 03/19/20 1039  BP: 134/65 115/60  Pulse: 62 (!) 58  Resp: 16   Temp: 37.1 C   SpO2: 96%     Last Pain:  Vitals:   03/19/20 1502  TempSrc:   PainSc: 3                  Martha Clan

## 2020-03-19 NOTE — Progress Notes (Signed)
Physical Therapy Treatment Patient Details Name: Robert Burch MRN: [id number] DOB: 13-Oct-1933 Today's Date: 03/19/2020    History of Present Illness presented to ER secondary to mechanical fall in home environment with acute onset of R hip pain; admitted for management of R intertrochanteric hip fracture, s/p R hip hemiarthroplasty (3/28), WBAT, posterior THPs.    PT Comments     Pt was long sitting in bed upon arriving. He agrees to PT session and is cooperative and pleasant throughout. Pt is A and O x 4 and able to follow commands well. He was able to recall 2/3 hip precautions. Reviewed no internal rotation and pt states understanding. Pt reports 6/10 pain that remained constant throughout. Pt BP 116/72 prior to gait and ambulation while seated EOB. Pt required min assist to exit bed with RLE support. CGA to stand and ambulate 120 ft with RW. Pt performed ascending/descending 4 stair with + 1 railing. He demonstarted safe ability to perform however c/o dizziness afterwards and required seated rest. BP drop to 96/52. Pt reclined in chair and pushed back to room. By the conclusion of session BP 105/53. RN notified. PT will return later this date to progress pt as able per POC. Therapist recommends DC to home with HHPT to follow when medically cleared. Pt would benefit from HHPT to address deficits with gait, strength, and safe functional mobility.   Follow Up Recommendations  Home health PT     Equipment Recommendations  Rolling walker with 5" wheels;3in1 (PT)    Recommendations for Other Services       Precautions / Restrictions Precautions Precautions: Fall;Posterior Hip Precaution Booklet Issued: Yes (comment) Restrictions Weight Bearing Restrictions: Yes RLE Weight Bearing: Weight bearing as tolerated    Mobility  Bed Mobility Overal bed mobility: Needs Assistance Bed Mobility: Supine to Sit     Supine to sit: Min assist     General bed mobility comments: Min  assist to exit L side of bed. Vcs for adhering to precautions and improved technique. MIn assist with RLE. pt's BP upon sitting up 116/72  Transfers Overall transfer level: Needs assistance Equipment used: Rolling walker (2 wheeled) Transfers: Sit to/from Stand Sit to Stand: Min guard         General transfer comment: Vcs for proper technique to adhere to precautions. Pt was able to stand with CGA  only. no physical lifting assistance required.  Ambulation/Gait Ambulation/Gait assistance: Min guard Gait Distance (Feet): 120 Feet Assistive device: Rolling walker (2 wheeled) Gait Pattern/deviations: Step-through pattern Gait velocity: decreased   General Gait Details: Pt demonstarted steady gait without LOB. antalgic reciprocal pattern.    Stairs Stairs: Yes Stairs assistance: Min guard Stair Management: One rail Right Number of Stairs: 4 General stair comments: Pt was able to safely perform ascending/descending 4 steps with RUE rail only. step to pattern without LOB. pt did c/o dizziness after performing requiring seated rest. BP 96/52. therapist elected to push pt back in recliner.   Wheelchair Mobility    Modified Rankin (Stroke Patients Only)       Balance                                            Cognition Arousal/Alertness: Awake/alert Behavior During Therapy: WFL for tasks assessed/performed Overall Cognitive Status: Within Functional Limits for tasks assessed  General Comments: Pt is A and oriented x 4. Follows commands well. Was able to recall 2/3 hip precations. re-educated on internal rotation       Exercises      General Comments        Pertinent Vitals/Pain Pain Assessment: 0-10 Pain Score: 6  Faces Pain Scale: Hurts little more Pain Location: R hip Pain Descriptors / Indicators: Aching;Grimacing;Guarding Pain Intervention(s): Limited activity within patient's tolerance;Monitored  during session;Repositioned;Ice applied    Home Living                      Prior Function            PT Goals (current goals can now be found in the care plan section) Acute Rehab PT Goals Patient Stated Goal: " To get better so I can safely walk better" Progress towards PT goals: Progressing toward goals    Frequency    BID      PT Plan Current plan remains appropriate    Co-evaluation              AM-PAC PT "6 Clicks" Mobility   Outcome Measure  Help needed turning from your back to your side while in a flat bed without using bedrails?: A Little Help needed moving from lying on your back to sitting on the side of a flat bed without using bedrails?: A Little Help needed moving to and from a bed to a chair (including a wheelchair)?: A Little Help needed standing up from a chair using your arms (e.g., wheelchair or bedside chair)?: A Little Help needed to walk in hospital room?: A Little Help needed climbing 3-5 steps with a railing? : A Little 6 Click Score: 18    End of Session Equipment Utilized During Treatment: Gait belt Activity Tolerance: Patient tolerated treatment well;Treatment limited secondary to medical complications (Comment)(BP drop) Patient left: in chair;with call bell/phone within reach;with nursing/sitter in room Nurse Communication: Mobility status PT Visit Diagnosis: Muscle weakness (generalized) (M62.81);Difficulty in walking, not elsewhere classified (R26.2);Pain Pain - Right/Left: Right Pain - part of body: Hip     Time: 0912-0937 PT Time Calculation (min) (ACUTE ONLY): 25 min  Charges:  $Gait Training: 8-22 mins $Therapeutic Activity: 8-22 mins                     Julaine Fusi PTA 03/19/20, 10:07 AM

## 2020-03-19 NOTE — Progress Notes (Addendum)
Occupational Therapy Treatment Patient Details Name: Robert Burch MRN: [id number] DOB: January 13, 1933 Today's Date: 03/19/2020    History of present illness Pt. presented to ER secondary to mechanical fall in home environment with acute onset of R hip pain; admitted for management of R intertrochanteric hip fracture, s/p R hip hemiarthroplasty (3/28), WBAT, posterior THPs.   OT comments  Pt./caregiver education was provided about A/E use for LE ADLs. Pt. required modA for A/E donning pants, and socks with cues for proper A/E use. Pt. education was provided about posterior hip precautions. Pt. would benefit from additional review of A/E use for LE ADLs. PB 125/60, HR 60. Pt. Would continues to benefit from OT services for ADL training, A/E training, and pt. Education about home modification, and DME.   Follow Up Recommendations  Home health OT;Supervision/Assistance - 24 hour    Equipment Recommendations  None recommended by OT    Recommendations for Other Services      Precautions / Restrictions Precautions Precautions: Fall;Posterior Hip Restrictions Weight Bearing Restrictions: Yes RLE Weight Bearing: Weight bearing as tolerated       Mobility Bed Mobility                  Transfers Overall transfer level: Needs assistance Equipment used: Rolling walker (2 wheeled) Transfers: Sit to/from Stand Sit to Stand: Min guard              Balance                                           ADL either performed or assessed with clinical judgement   ADL Overall ADL's : Needs assistance/impaired Eating/Feeding: Set up;Independent   Grooming: Set up;Supervision/safety       Lower Body Bathing: Maximal assistance;Set up   Upper Body Dressing : Set up   Lower Body Dressing: Set up;Moderate assistance Lower Body Dressing Details (indicate cue type and reason): With A/E Toilet Transfer: Min Psychiatric nurse Details (indicate cue type  and reason): urinal use in standing           General ADL Comments: Pt. education was provided about A/E use for LE ADLs, and posterior hip precautions.     Vision Baseline Vision/History: Wears glasses Patient Visual Report: No change from baseline     Perception     Praxis      Cognition Arousal/Alertness: Awake/alert Behavior During Therapy: WFL for tasks assessed/performed Overall Cognitive Status: Within Functional Limits for tasks assessed                                          Exercises     Shoulder Instructions       General Comments      Pertinent Vitals/ Pain       Pain Assessment: 0-10 Pain Score: 4  Faces Pain Scale: Hurts little more Pain Location: R hip Pain Descriptors / Indicators: Aching;Grimacing;Guarding Pain Intervention(s): Limited activity within patient's tolerance;Monitored during session  Home Living                                          Prior Functioning/Environment  Frequency  Min 2X/week        Progress Toward Goals  OT Goals(current goals can now be found in the care plan section)  Progress towards OT goals: Progressing toward goals  Acute Rehab OT Goals Patient Stated Goal: Pt. regain independence OT Goal Formulation: With patient/family Time For Goal Achievement: 04/01/20 Potential to Achieve Goals: Good  Plan      Co-evaluation                 AM-PAC OT "6 Clicks" Daily Activity     Outcome Measure   Help from another person eating meals?: None Help from another person taking care of personal grooming?: None Help from another person toileting, which includes using toliet, bedpan, or urinal?: A Little Help from another person bathing (including washing, rinsing, drying)?: A Little Help from another person to put on and taking off regular upper body clothing?: None Help from another person to put on and taking off regular lower body clothing?: A  Lot 6 Click Score: 20    End of Session Equipment Utilized During Treatment: Gait belt;Rolling walker  OT Visit Diagnosis: Other abnormalities of gait and mobility (R26.89);Repeated falls (R29.6);Muscle weakness (generalized) (M62.81);Pain Pain - Right/Left: Right Pain - part of body: Hip   Activity Tolerance Patient tolerated treatment well   Patient Left in chair;with call bell/phone within reach;with family/visitor present   Nurse Communication Other (comment)        [id number] OT Time Calculation (min): 50 min  Charges: OT General Charges $OT Visit: 1 Visit OT Treatments $Self Care/Home Management : 38-52 mins  Harrel Carina, MS, OTR/L  Harrel Carina 03/19/2020, 12:24 PM

## 2020-03-19 NOTE — Discharge Instructions (Signed)

## 2020-03-19 NOTE — Progress Notes (Signed)
  Subjective: 2 Days Post-Op Procedure(s) (LRB): ARTHROPLASTY BIPOLAR HIP (HEMIARTHROPLASTY) (Right) Patient reports pain as mild.   Patient is well, and has had no acute complaints or problems Plan is to go Home after hospital stay. Negative for chest pain and shortness of breath Fever: no Gastrointestinal: Negative for nausea and vomiting  Objective: Vital signs in last 24 hours: Temp:  [97.5 F (36.4 C)-98.5 F (36.9 C)] 98.5 F (36.9 C) (03/29 2222) Pulse Rate:  [62-71] 71 (03/29 2222) Resp:  [17-20] 17 (03/29 2222) BP: (101-145)/(56-81) 144/81 (03/29 2222) SpO2:  [95 %-100 %] 96 % (03/29 2222)  Intake/Output from previous day:  Intake/Output Summary (Last 24 hours) at 03/19/2020 0703 Last data filed at 03/18/2020 1018 Gross per 24 hour  Intake 240 ml  Output -  Net 240 ml    Intake/Output this shift: No intake/output data recorded.  Labs: Recent Labs    03/16/20 1612 03/17/20 0628 03/18/20 0522 03/19/20 0524  HGB 13.1 12.5* 11.3* 10.5*   Recent Labs    03/18/20 0522 03/19/20 0524  WBC 9.3 8.1  RBC 4.08* 3.78*  HCT 33.6* 30.8*  PLT 170 174   Recent Labs    03/18/20 0522 03/19/20 0524  NA 132* 132*  K 4.7 4.9  CL 102 101  CO2 25 25  BUN 16 23  CREATININE 1.05 1.23  GLUCOSE 166* 103*  CALCIUM 7.8* 7.9*   Recent Labs    03/18/20 0522 03/19/20 0524  INR 1.4* 1.2     EXAM General - Patient is Alert and Oriented Extremity - Neurovascular intact Sensation intact distally Dorsiflexion/Plantar flexion intact Compartment soft Dressing/Incision - clean, dry, no drainage with the abduction brace intact. Motor Function - intact, moving foot and toes well on exam.  Ambulated 200 feet with physical therapy.  Past Medical History:  Diagnosis Date  . Atypical chest pain    a. Nonobstructive, very minor irregs by cath 2007;  b. 06/2009 Ex MV: small area of apical lateral ischemia;  c. 06/2009 Cath: essentially normal;  d. 04/2014 Lexiscan MV: EF 59%,  no ischemia/infarct->low risk.  . Cancer ([place])   . Chronic anticoagulation    a. Chronic Coumadin  . Chronic anxiety   . Diverticulosis   . Dysrhythmia    atrial fib  . GERD (gastroesophageal reflux disease)   . Hypertension   . IBS (irritable bowel syndrome)   . Nonischemic cardiomyopathy ([place])    a. EF 45% by echo 2010;  b. 05/2012 Echo: EF 55-65%, no rwma, Gr 1 DD, mild AI/MR, mildly dil LA.  . Paralyzed vocal cords   . Paroxysmal atrial fibrillation ([place])    a. Treated with flecainide  . Proteinuria    History of    Assessment/Plan: 2 Days Post-Op Procedure(s) (LRB): ARTHROPLASTY BIPOLAR HIP (HEMIARTHROPLASTY) (Right) Principal Problem:   Closed right femoral fracture (HCC) Active Problems:   Essential hypertension   Paroxysmal atrial fibrillation (HCC)   GERD (gastroesophageal reflux disease)   Hyperlipidemia  Estimated body mass index is 27.52 kg/m as calculated from the following:   Height as of this encounter: 5\' 10"  (1.778 m).   Weight as of this encounter: 87 kg. Advance diet Up with therapy Discharge home with home health when discharged by medicine follow-up at [facility] clinic in 2 weeks for staple removal  DVT Prophylaxis - Coumadin, Foot Pumps and TED hose Weight-Bearing as tolerated to right leg  Reche Dixon, PA-C Orthopaedic Surgery 03/19/2020, 7:03 AM

## 2020-03-19 NOTE — Progress Notes (Signed)
Physical Therapy Treatment Patient Details Name: Robert Burch MRN: [id number] DOB: [birth year] Today's Date: 03/19/2020    History of Present Illness Pt. presented to ER secondary to mechanical fall in home environment with acute onset of R hip pain; admitted for management of R intertrochanteric hip fracture, s/p R hip hemiarthroplasty (3/28), WBAT, posterior THPs.    PT Comments    Pt was seated in recliner upon arriving with spouse/son at bedside. He agrees to PT session. Therapist reviewed hip precautions with pt and family. Pt was able to recall 2/3 precautions. Pt spouse states understanding and that she will assist pt at home with adhering. BP in sitting prior to activity 106/66. After standing, ambulating, and performing stairs, BP 116/ 72. No c/o dizziness this throughout this session. Pt was able to stand and ambulate with CGA for safety 120 ft and perform stairs with CGA + moderate vcs for correct sequencing. Spouse/son state they will help pt remember proper sequencing at home. Therapist recommends DC to home with HHPT to follow. He will benefit form continues skilled PT to address deficits with gait, balance, strength, and overall safe functional mobility. Pt was left in recliner post session with call bell in reach, RN aware of pt's abilities, and chair alarm in place. Both pt/pt's family members feel confident in safe DC to home this afternoon.       Follow Up Recommendations  Home health PT     Equipment Recommendations  None recommended by PT    Recommendations for Other Services       Precautions / Restrictions Precautions Precautions: Fall;Posterior Hip Precaution Booklet Issued: Yes (comment) Restrictions Weight Bearing Restrictions: Yes RLE Weight Bearing: Weight bearing as tolerated    Mobility  Bed Mobility               General bed mobility comments: Pt was seated in recliner pre/post PT session  Transfers Overall transfer level: Needs  assistance Equipment used: Rolling walker (2 wheeled) Transfers: Sit to/from Stand Sit to Stand: Min guard         General transfer comment: CGA for safety, moderate vcs for correct technique to adhere to hip precautions. Pt's spouse/son encouraged proper technique  Ambulation/Gait Ambulation/Gait assistance: Min guard Gait Distance (Feet): 160 Feet Assistive device: Rolling walker (2 wheeled) Gait Pattern/deviations: Step-through pattern Gait velocity: decreased   General Gait Details: Pt demonstarted steady gait without LOB. antalgic reciprocal pattern.    Stairs Stairs: Yes Stairs assistance: Min guard Stair Management: One rail Right;Sideways Number of Stairs: 4 General stair comments: pt was able to perform ascending/descending stairs with moderate vcs for proper sequencing however CGA  only for safety. Pt has difficulty remembering proper sequencing but son/spouse will be there to assist.   Wheelchair Mobility    Modified Rankin (Stroke Patients Only)       Balance                                            Cognition Arousal/Alertness: Awake/alert Behavior During Therapy: WFL for tasks assessed/performed Overall Cognitive Status: History of cognitive impairments - at baseline                                 General Comments: Pt's son/spouse at bedside upon arriving. They report pt is " forgetfull" " has difficulty  prior to surgery"  Pt continues to struggle with recall of hip precautions and safety recommendations however support family will be able to provide 24 hour care and will address all concerns.      Exercises Total Joint Exercises Ankle Circles/Pumps: AROM;20 reps;Both Quad Sets: AROM;Right;10 reps Gluteal Sets: AROM;Right;10 reps Short Arc Quad: AROM;Right;10 reps Heel Slides: AROM;Right;10 reps Hip ABduction/ADduction: AAROM;Right;5 reps    General Comments        Pertinent Vitals/Pain Pain Assessment:  0-10 Pain Score: 5  Faces Pain Scale: Hurts a little bit Pain Location: R hip Pain Descriptors / Indicators: Aching;Grimacing;Guarding Pain Intervention(s): Limited activity within patient's tolerance;Monitored during session;Repositioned    Home Living                      Prior Function            PT Goals (current goals can now be found in the care plan section) Acute Rehab PT Goals Patient Stated Goal: To be able to go home safely Progress towards PT goals: Progressing toward goals    Frequency    BID      PT Plan Current plan remains appropriate    Co-evaluation              AM-PAC PT "6 Clicks" Mobility   Outcome Measure  Help needed turning from your back to your side while in a flat bed without using bedrails?: A Little Help needed moving from lying on your back to sitting on the side of a flat bed without using bedrails?: A Little Help needed moving to and from a bed to a chair (including a wheelchair)?: A Little Help needed standing up from a chair using your arms (e.g., wheelchair or bedside chair)?: A Little Help needed to walk in hospital room?: A Little Help needed climbing 3-5 steps with a railing? : A Little 6 Click Score: 18    End of Session Equipment Utilized During Treatment: Gait belt Activity Tolerance: Patient tolerated treatment well Patient left: in chair;with call bell/phone within reach;with nursing/sitter in room Nurse Communication: Mobility status PT Visit Diagnosis: Muscle weakness (generalized) (M62.81);Difficulty in walking, not elsewhere classified (R26.2);Pain Pain - Right/Left: Right Pain - part of body: Hip     Time: [id number] PT Time Calculation (min) (ACUTE ONLY): 38 min  Charges:  $Gait Training: 8-22 mins $Therapeutic Exercise: 8-22 mins $Therapeutic Activity: 8-22 mins                     Julaine Fusi PTA 03/19/20, 3:09 PM

## 2020-03-19 NOTE — Care Management Important Message (Signed)
Important Message  Patient Details  Name: Robert Burch MRN: [id number] Date of Birth: 1933-08-23   Medicare Important Message Given:  Yes     [name], Leroy Sea 03/19/2020, 10:16 AM

## 2020-03-19 NOTE — Progress Notes (Signed)
Education on Lovenox injection was done with the pt, wife and son at bedside through demonstration. Lovenox Kit was given to pt. Questions and clarifications were answered. 

## 2020-03-19 NOTE — Progress Notes (Addendum)
ANTICOAGULATION CONSULT NOTE - Initial Consult  Pharmacy Consult for Warfarin  Indication: atrial fibrillation  Allergies  Allergen Reactions  . Ciprofloxacin     Diarrhea and stomach pains  . Lanoxin [Digoxin]   . Lopressor [Metoprolol Tartrate]   . Sulfonamide Derivatives     Rash   . Verapamil     Patient Measurements: Height: 5\' 10"  (177.8 cm) Weight: 191 lb 12.8 oz (87 kg) IBW/kg (Calculated) : 73 Heparin Dosing Weight:   Vital Signs: Temp: 98.5 F (36.9 C) (03/29 2222) Temp Source: Oral (03/29 2222) BP: 144/81 (03/29 2222) Pulse Rate: 71 (03/29 2222)  Labs: Recent Labs    03/16/20 1808 03/16/20 1808 03/17/20 0628 03/17/20 0628 03/17/20 1016 03/18/20 0522 03/19/20 0524  HGB  --    < > 12.5*   < >  --  11.3* 10.5*  HCT  --    < > 38.2*  --   --  33.6* 30.8*  PLT  --    < > 193  --   --  170 174  APTT 44*  --   --   --   --   --   --   LABPROT 26.0*   < > 19.3*   < > 18.4* 17.1* 15.5*  INR 2.4*   < > 1.6*   < > 1.5* 1.4* 1.2  CREATININE  --    < > 0.95  --   --  1.05 1.23   < > = values in this interval not displayed.    Estimated Creatinine Clearance: 46.2 mL/min (by C-G formula based on SCr of 1.23 mg/dL).   Medical History: Past Medical History:  Diagnosis Date  . Atypical chest pain    a. Nonobstructive, very minor irregs by cath 2007;  b. 06/2009 Ex MV: small area of apical lateral ischemia;  c. 06/2009 Cath: essentially normal;  d. 04/2014 Lexiscan MV: EF 59%, no ischemia/infarct->low risk.  . Cancer ([place])   . Chronic anticoagulation    a. Chronic Coumadin  . Chronic anxiety   . Diverticulosis   . Dysrhythmia    atrial fib  . GERD (gastroesophageal reflux disease)   . Hypertension   . IBS (irritable bowel syndrome)   . Nonischemic cardiomyopathy ([place])    a. EF 45% by echo 2010;  b. 05/2012 Echo: EF 55-65%, no rwma, Gr 1 DD, mild AI/MR, mildly dil LA.  . Paralyzed vocal cords   . Paroxysmal atrial fibrillation ([place])    a. Treated with  flecainide  . Proteinuria    History of    Medications:  Medications Prior to Admission  Medication Sig Dispense Refill Last Dose  . acidophilus (RISAQUAD) CAPS capsule Take 1 capsule by mouth daily.     . Cholecalciferol (VITAMIN D3) 400 UNITS CAPS Take 800 Units by mouth daily.      . Cobalamine Combinations (VITAMIN B12-FOLIC ACID) XX123456 MCG TABS Take 1 tablet by mouth daily.     . Coenzyme Q10 50 MG CAPS Take 50 mg by mouth 2 (two) times daily.      . Cranberry 500 MG CAPS Take 500 mg by mouth daily.     . diclofenac sodium (VOLTAREN) 1 % GEL Apply 2-4 g topically every 6 (six) hours as needed.    Unknown at PRN  . diltiazem (CARDIZEM) 30 MG tablet Take 1 tablet (30 mg) by mouth once daily at bedtime 30 tablet 11 03/15/2020 at 2100  . finasteride (PROSCAR) 5 MG tablet Take 1 tablet (  5 mg total) by mouth daily. 30 tablet 11 03/16/2020 at 0800  . Flaxseed, Linseed, (FLAX SEED OIL) 1000 MG CAPS Take 1,000 mg by mouth daily.      . flecainide (TAMBOCOR) 50 MG tablet Take 2 tablets (100 mg) by mouth twice daily as needed for recurrent atrial fibrillation 12 tablet 3 Unknown at PRN  . Ginger 500 MG CAPS Take 500 mg by mouth 2 (two) times daily.     . hydrocortisone (ANUSOL-HC) 25 MG suppository Place 25 mg rectally daily as needed for hemorrhoids or anal itching.   Unknown at PRN  . magnesium oxide (MAG-OX) 400 MG tablet TAKE 1 TABLET BY MOUTH TWICE A DAY (Patient taking differently: Take 400 mg by mouth 2 (two) times daily. ) 180 tablet 0 03/16/2020 at 0800  . mirabegron ER (MYRBETRIQ) 25 MG TB24 tablet Take 1 tablet (25 mg total) by mouth daily. (Patient taking differently: Take 25 mg by mouth at bedtime. ) 30 tablet 11 03/15/2020 at 2100  . Multiple Vitamin (MULTIVITAMIN) tablet Take 1 tablet by mouth daily.       . nitroGLYCERIN (NITROSTAT) 0.4 MG SL tablet PLACE 1 TABLET (0.4 MG TOTAL) UNDER THE TONGUE EVERY 5 (FIVE) MINUTES AS NEEDED FOR CHEST PAIN. 90 tablet 0 Unknown at PRN  . Omega-3  Fatty Acids (FISH OIL) 1000 MG CAPS Take 1,000 mg by mouth daily.      . pantoprazole (PROTONIX) 20 MG tablet Take 20 mg by mouth daily.      . polyethylene glycol (MIRALAX / GLYCOLAX) packet Take 17 g by mouth daily as needed for mild constipation or moderate constipation.   Unknown at PRN  . saw palmetto 160 MG capsule Take 160 mg by mouth 2 (two) times daily.     . tamsulosin (FLOMAX) 0.4 MG CAPS capsule Take 1 capsule (0.4 mg total) by mouth daily after supper. (Patient taking differently: Take 0.4 mg by mouth 2 (two) times daily. ) 180 capsule 2 03/16/2020 at 0800   . traMADol (ULTRAM) 50 MG tablet Take 50 mg by mouth every 6 (six) hours as needed for moderate pain.   Unknown at PRN  . warfarin (COUMADIN) 2.5 MG tablet Take 2.5 mg by mouth every evening.    03/15/2020 at 1800    Assessment: Pharmacy consulted to dose warfarin in this 86 year old male S/P hip hemiarthroplasty on 3/28 after falling at home on 3/27.   Pt was taking warfarin 2.5 mg PO daily for Afib and was therapeutic on arrival .  Pt received Vit K 5 mg IV X 1 on 3/27 in preparation for surgery.    Patient is currently on Enoxaparin 40 mg Q24H - not full therapeutic bridge.   3/27: INR 2.4 (IV Vit K 5 mg x 1) 3/28: INR 1.5 3/29: INR 1.4 - warfarin 2.5 mg was not given yesterday (3/29) as it was scheduled to be given  3/30: INR 1.2- subtherapeutic. S/p missed dose yesterday and prior events of warfarin being held and vitamin K administered.   Goal of Therapy:  INR 2-3   Plan:   Will order warfarin 3.5 mg today (home dose 2.5 mg). Will check INR daily and CBC every 3 days.  Will follow up on enoxaparin dose for warfarin bridge. Update: Per Dr. Berle Mull, will proceed with prophylactic LMWH dosing to bridge to warfarin in this patient - given atrial fibrillation.   Rowland Lathe, PharmD  03/19/2020,7:54 AM

## 2020-03-20 NOTE — Discharge Summary (Addendum)
[facility] Discharge Summary   Patient: Robert Burch [id number]  PCP: [name], MD  Date of admission: 03/16/2020   Date of discharge: 03/19/2020      Discharge Diagnoses:  Principal Problem:   Closed right femoral fracture [facility]) Active Problems:   Essential hypertension   Paroxysmal atrial fibrillation (HCC)   GERD (gastroesophageal reflux disease)   Hyperlipidemia   Admitted From: home Disposition:  Home   Recommendations for Outpatient Follow-up:  1. PCP: please follow up with PCP in 1 week 2. Follow up LABS/TEST:  INR on Friday and Monday, CBC next Monday.   Follow-up Information    Reche [place], [place]. Schedule an appointment as soon as possible for a visit on 04/02/2020.   Specialty: Orthopedic Surgery Why: For staple removal and x-rays of the right hip;  @ 3:15 pm Contact information: [address] [place] [address] [phone]        [name], MD. Schedule an appointment as soon as possible for a visit in 1 week.   Specialty: Family Medicine Why: INR check up on friday 03/22/2020 and monday 03/25/2020. CBC check up on 03/25/2020.  Contact information: [address] [place] [address] [phone]          Diet recommendation: Cardiac diet  Activity: The patient is advised to gradually reintroduce usual activities, as tolerated  Discharge Condition: stable  Code Status: Full code   History of present illness: As per the H and P dictated on admission, "Robert Burch is a 86 y.o. male with medical history significant of coronary artery disease nonobstructive to chronic anticoagulation with Coumadin due to A. fib, atrial fibrillation, GERD, hypertension, anxiety disorder and diverticular disease who sustained a mechanical fall at home today when his shoes got up at the bottom of stairs he slipped and fell.  Patient's wife helped him to a wheelchair back into the ER home.  He realized  right hip pain and was unable to get up.  He then realized that he may have broken his bone.  He was also having pain in his right knee.  Patient was seen in the ER evaluated and found to have right intertrochanteric fracture.  He is otherwise hemodynamically stable.  Dr. Posey Pronto the orthopedic surgeon was consulted and plans open reduction tomorrow.  Patient is being admitted to medical service for evaluation.  Patient did not hit his head.  He did not lose consciousness.  Do not have any other site of injury.  His pain is currently a 3 out of 10 after receiving pain medications in the ER."  Hospital Course:  Summary of his active problems in the hospital is as following.   1.  Closed right transcervical femur fracture Mechanical fall CT head and CT C-spine unremarkable. Pain control with medication right now. Underwent right hip hemiarthroplasty. Tolerated well. We will monitor.  PT OT recommends home health DVTPx with subcu lovenox prophylactic dose until INR is subtherapeutic .   2. Preoperative medical evaluation  Tolerated surgery very well.    3.  Essential hypertension. Isolated supine hypertension Paroxysmal A. fib Follows up with [facility]. Currently normal sinus rhythm. Patient is on Cardizem for supine hypertension nightly Uses flecainide as needed (not 50 mg twice daily) On chronic anticoagulation with Coumadin resume postop.  4.  GERD Continue PPI, Carafate, H2 blocker  5.  Anxiety Continue current regimen  6.  Overactive bladder BPH On Myrbetriq.  Which is currently continued.  Also on Flomax.  Uses saw palmetto as well.  7. Post Op acute blood loss anemia  Hb on down trend likely dilutional  No active bleed and wound looks ok per Orthopedics. Recommended to monitor CBC outpatient   Pain control  - [facility] Controlled Substance Reporting System database was reviewed. - Patient was instructed, not to drive, operate heavy machinery, perform  activities at heights, swimming or participation in water activities or provide baby sitting services while on Pain, Sleep and Anxiety Medications; until his outpatient Physician has advised to do so again.  - Also recommended to not to take more than prescribed Pain, Sleep and Anxiety Medications.  Patient was seen by physical therapy, who recommended Home health, which was arranged. On the day of the discharge the patient's vitals were stable, and no other acute medical condition were reported by patient. the patient was felt safe to be discharge at Home with Home health.  Consultants: orthopedics  Procedures: right hip hemi arthroplasty   Discharge Exam: General: Appear in no distress, no Rash; Oral Mucosa Clear, moist. Cardiovascular: S1 and S2 Present, no Murmur, Respiratory: normal respiratory effort, Bilateral Air entry present and no Crackles, no wheezes Abdomen: Bowel Sound present, Soft and no tenderness, no hernia Extremities: no Pedal edema, no calf tenderness Neurology: alert and oriented to time, place, and person affect appropriate.  Filed Weights   03/16/20 1600 03/16/20 2057 03/17/20 0415  Weight: 87.1 kg 88.5 kg 87 kg   Vitals:   03/19/20 0816 03/19/20 1039  BP: 134/65 115/60  Pulse: 62 (!) 58  Resp: 16   Temp: 98.7 F (37.1 C)   SpO2: 96%     DISCHARGE MEDICATION: Allergies as of 03/19/2020      Reactions   Ciprofloxacin    Diarrhea and stomach pains   Lanoxin [digoxin]    Lopressor [metoprolol Tartrate]    Sulfonamide Derivatives    Rash   Verapamil       Medication List    TAKE these medications   acidophilus Caps capsule Take 1 capsule by mouth daily.   Coenzyme Q10 50 MG Caps Take 50 mg by mouth 2 (two) times daily.   Cranberry 500 MG Caps Take 500 mg by mouth daily.   diclofenac sodium 1 % Gel Commonly known as: VOLTAREN Apply 2-4 g topically every 6 (six) hours as needed.   diltiazem 30 MG tablet Commonly known as: CARDIZEM Take 1  tablet (30 mg) by mouth once daily at bedtime   docusate sodium 100 MG capsule Commonly known as: COLACE Take 1 capsule (100 mg total) by mouth 2 (two) times daily.   enoxaparin 40 MG/0.4ML injection Commonly known as: LOVENOX Inject 0.4 mLs (40 mg total) into the skin daily for 5 days.   finasteride 5 MG tablet Commonly known as: PROSCAR Take 1 tablet (5 mg total) by mouth daily.   Fish Oil 1000 MG Caps Take 1,000 mg by mouth daily.   Flax Seed Oil 1000 MG Caps Take 1,000 mg by mouth daily.   flecainide 50 MG tablet Commonly known as: TAMBOCOR Take 2 tablets (100 mg) by mouth twice daily as needed for recurrent atrial fibrillation   Ginger 500 MG Caps Take 500 mg by mouth 2 (two) times daily.   hydrocortisone 25 MG suppository Commonly known as: ANUSOL-HC Place 25 mg rectally daily as needed for hemorrhoids or anal itching.   magnesium oxide 400 MG tablet Commonly known as: MAG-OX TAKE 1 TABLET BY MOUTH TWICE A  DAY   mirabegron ER 25 MG Tb24 tablet Commonly known as: MYRBETRIQ Take 1 tablet (25 mg total) by mouth daily. What changed: when to take this   multivitamin tablet Take 1 tablet by mouth daily.   nitroGLYCERIN 0.4 MG SL tablet Commonly known as: NITROSTAT PLACE 1 TABLET (0.4 MG TOTAL) UNDER THE TONGUE EVERY 5 (FIVE) MINUTES AS NEEDED FOR CHEST PAIN.   oxyCODONE 5 MG immediate release tablet Commonly known as: Oxy IR/ROXICODONE Take 0.5-1 tablets (2.5-5 mg total) by mouth every 4 (four) hours as needed for moderate pain (pain score 4-6).   pantoprazole 20 MG tablet Commonly known as: PROTONIX Take 20 mg by mouth daily.   polyethylene glycol 17 g packet Commonly known as: MIRALAX / GLYCOLAX Take 17 g by mouth daily as needed for mild constipation or moderate constipation.   saw palmetto 160 MG capsule Take 160 mg by mouth 2 (two) times daily.   tamsulosin 0.4 MG Caps capsule Commonly known as: FLOMAX Take 1 capsule (0.4 mg total) by mouth daily  after supper. What changed: when to take this   traMADol 50 MG tablet Commonly known as: ULTRAM Take 1 tablet (50 mg total) by mouth every 6 (six) hours as needed for moderate pain.   Vitamin B12-Folic Acid XX123456 MCG Tabs Take 1 tablet by mouth daily.   Vitamin D3 10 MCG (400 UNIT) Caps Take 800 Units by mouth daily.   warfarin 1 MG tablet Commonly known as: Coumadin Take 1 tablet (1 mg total) by mouth daily for 1 day. Take total 3.5 mg daily on 03/19/2020 and 03/20/2020. What changed: You were already taking a medication with the same name, and this prescription was added. Make sure you understand how and when to take each.   warfarin 2.5 MG tablet Commonly known as: COUMADIN Take 1 tablet (2.5 mg total) by mouth every evening. Take 3.5 mg daily total dose on 03/19/2020 and 03/20/2020. Start taking 2.5 mg daily from 03/21/2020. What changed: additional instructions      Allergies  Allergen Reactions  . Ciprofloxacin     Diarrhea and stomach pains  . Lanoxin [Digoxin]   . Lopressor [Metoprolol Tartrate]   . Sulfonamide Derivatives     Rash   . Verapamil    Discharge Instructions    Diet - low sodium heart healthy   Complete by: As directed    Discharge instructions   Complete by: As directed    It is important that you read the given instructions as well as go over your medication list with RN to help you understand your care after this hospitalization.  Please follow-up with PCP in 1-2 weeks.  Please note that NO REFILLS for any discharge medications will be authorized once you are discharged, as it is imperative that you return to your primary care physician (or establish a relationship with a primary care physician if you do not have one) for your aftercare needs so that they can reassess your need for medications and monitor your lab values.  Please request your primary care physician to go over all Hospital Tests and Procedure/Radiological results at the follow  up. Please get all Hospital records sent to your PCP by signing hospital release before you go home.   Do not take more than prescribed Pain, Sleep and Anxiety Medications.  You were cared for by a hospitalist during your hospital stay. If you have any questions about your discharge medications or the care you received while you were in  the hospital after you are discharged, you can call the unit @UNIT @ you were admitted to and ask to speak with the hospitalist Berle Mull. Ask for Hospitalist on call if the hospitalist that took care of you is not available.   Once you are discharged, your primary care physician will handle any further medical issues.  You Must read complete instructions/literature along with all the possible adverse reactions/side effects for all the Medicines you take and that have been prescribed to you. Take any new Medicines after you have completely understood and accept all the possible adverse reactions/side effects.  If you have smoked or chewed Tobacco in the last 2 yrs please STOP smoking If you drink alcohol, please safely reduce the use. Do not drive, operating heavy machinery, perform activities at heights, swimming or participation in water activities or provide baby sitting services under influence.  Wear Seat belts while driving.   Increase activity slowly   Complete by: As directed       The results of significant diagnostics from this hospitalization (including imaging, microbiology, ancillary and laboratory) are listed below for reference.    Significant Diagnostic Studies: DG Chest 1 View  Result Date: 03/16/2020 CLINICAL DATA:  Hip fracture. EXAM: CHEST  1 VIEW COMPARISON:  August 08, 2018 FINDINGS: The heart size is mildly enlarged. There is no pneumothorax or large pleural effusion. No focal infiltrate. No acute osseous abnormality involving the visualized portions of the thorax. IMPRESSION: No active disease. Electronically Signed   By: Constance Holster M.D.   On: 03/16/2020 17:22   CT Head Wo Contrast  Result Date: 03/16/2020 CLINICAL DATA:  Pain status post fall EXAM: CT HEAD WITHOUT CONTRAST CT CERVICAL SPINE WITHOUT CONTRAST TECHNIQUE: Multidetector CT imaging of the head and cervical spine was performed following the standard protocol without intravenous contrast. Multiplanar CT image reconstructions of the cervical spine were also generated. COMPARISON:  MRI cervical spine dated May 31, 2012. CT head dated May 17, 2011. FINDINGS: CT HEAD FINDINGS Brain: No evidence of acute infarction, hemorrhage, hydrocephalus, extra-axial collection or mass lesion/mass effect. Atrophy and chronic microvascular ischemic changes are noted. Vascular: No hyperdense vessel or unexpected calcification. Skull: Normal. Negative for fracture or focal lesion. Sinuses/Orbits: There is a mucosal retention cyst in the left maxillary sinus. There is opacification of the left mastoid air cells. The remaining paranasal sinuses and mastoid air cells are essentially clear. Other: None. CT CERVICAL SPINE FINDINGS Alignment: Normal. Skull base and vertebrae: No acute fracture. No primary bone lesion or focal pathologic process. Soft tissues and spinal canal: No prevertebral fluid or swelling. No visible canal hematoma. Disc levels: Multilevel disc height loss is noted throughout the cervical spine, greatest at the C6-C7 level. Multilevel osseous neural foraminal narrowing is noted bilaterally. Upper chest: Negative. Other: None IMPRESSION: 1. No acute intracranial abnormality. 2. No acute cervical spine fracture. Electronically Signed   By: Constance Holster M.D.   On: 03/16/2020 17:01   CT Cervical Spine Wo Contrast  Result Date: 03/16/2020 CLINICAL DATA:  Pain status post fall EXAM: CT HEAD WITHOUT CONTRAST CT CERVICAL SPINE WITHOUT CONTRAST TECHNIQUE: Multidetector CT imaging of the head and cervical spine was performed following the standard protocol without intravenous  contrast. Multiplanar CT image reconstructions of the cervical spine were also generated. COMPARISON:  MRI cervical spine dated May 31, 2012. CT head dated May 17, 2011. FINDINGS: CT HEAD FINDINGS Brain: No evidence of acute infarction, hemorrhage, hydrocephalus, extra-axial collection or mass lesion/mass effect. Atrophy and  chronic microvascular ischemic changes are noted. Vascular: No hyperdense vessel or unexpected calcification. Skull: Normal. Negative for fracture or focal lesion. Sinuses/Orbits: There is a mucosal retention cyst in the left maxillary sinus. There is opacification of the left mastoid air cells. The remaining paranasal sinuses and mastoid air cells are essentially clear. Other: None. CT CERVICAL SPINE FINDINGS Alignment: Normal. Skull base and vertebrae: No acute fracture. No primary bone lesion or focal pathologic process. Soft tissues and spinal canal: No prevertebral fluid or swelling. No visible canal hematoma. Disc levels: Multilevel disc height loss is noted throughout the cervical spine, greatest at the C6-C7 level. Multilevel osseous neural foraminal narrowing is noted bilaterally. Upper chest: Negative. Other: None IMPRESSION: 1. No acute intracranial abnormality. 2. No acute cervical spine fracture. Electronically Signed   By: Constance Holster M.D.   On: 03/16/2020 17:01   DG Pelvis Portable  Result Date: 03/17/2020 CLINICAL DATA:  Right hip hemiarthroplasty after fracture EXAM: PORTABLE PELVIS 1-2 VIEWS COMPARISON:  03/17/2020 FINDINGS: Frontal view of the bilateral hips and pelvis was performed. The iliac crests are excluded. Right hip hemiarthroplasty is in the expected position without evidence of complication. Postsurgical changes are seen in the soft tissues. IMPRESSION: 1. Right hip hemiarthroplasty. Electronically Signed   By: Randa Ngo M.D.   On: 03/17/2020 18:58   DG Pelvis Portable  Result Date: 03/17/2020 CLINICAL DATA:  Intraoperative for right hip  hemiarthroplasty EXAM: PORTABLE PELVIS 1-2 VIEWS COMPARISON:  Pelvic radiograph from earlier today FINDINGS: Proximal right femoral prosthesis noted. Expected soft tissue gas within and surrounding the right hip joint. IMPRESSION: Intraoperative evaluation for right hip hemiarthroplasty. Electronically Signed   By: Ilona Sorrel M.D.   On: 03/17/2020 17:33   DG Pelvis Portable  Result Date: 03/17/2020 CLINICAL DATA:  Intraoperative evaluation, right hip and knee arthroplasty EXAM: PORTABLE PELVIS 1-2 VIEWS COMPARISON:  03/17/2020 at 4:48 p.m. FINDINGS: Frontal view of the pelvis excluding the iliac crest and portions of the proximal left femur was performed. Stable position of the femoral component of the right hip hemiarthroplasty. Soft tissue gas consistent with intraoperative evaluation. IMPRESSION: 1. Intraoperative evaluation during placement of a right hip hemiarthroplasty. Electronically Signed   By: Randa Ngo M.D.   On: 03/17/2020 17:29   DG Pelvis Portable  Result Date: 03/17/2020 CLINICAL DATA:  Intraoperative evaluation during right hip hemiarthroplasty EXAM: PORTABLE PELVIS 1-2 VIEWS COMPARISON:  03/16/2020 FINDINGS: Frontal view of the pelvis excludes portions of the iliac crests and bilateral proximal femurs. Femoral component of a right hip arthroplasty is identified. Soft tissue gas consistent with intraoperative evaluation. IMPRESSION: 1. Intraoperative evaluation during placement of a right hip hemiarthroplasty. Electronically Signed   By: Randa Ngo M.D.   On: 03/17/2020 17:28   DG Hip Unilat W or Wo Pelvis 2-3 Views Right  Result Date: 03/16/2020 CLINICAL DATA:  Pain status post fall EXAM: DG HIP (WITH OR WITHOUT PELVIS) 2-3V RIGHT COMPARISON:  None. FINDINGS: There is an acute displaced transcervical fracture of the proximal right femur. There is no dislocation. There is osteopenia. There is mild-to-moderate osteoarthritis involving both hips. Multiple calcifications project  over the patient's pelvis and are favored to represent phleboliths. IMPRESSION: 1. Acute displaced transcervical fracture of the proximal right femur. 2. 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 Electronically Signed   By: Constance Holster M.D.   On: 03/16/2020 17:19    Microbiology: Recent Results (from the past 240 hour(s))  Respiratory Panel by RT PCR (Flu A&B, Covid) - Nasopharyngeal Swab  Status: None   Collection Time: 03/16/20  6:01 PM   Specimen: Nasopharyngeal Swab  Result Value Ref Range Status   SARS Coronavirus 2 by RT PCR NEGATIVE NEGATIVE Final    Comment: (NOTE) SARS-CoV-2 target nucleic acids are NOT DETECTED. The SARS-CoV-2 RNA is generally detectable in upper respiratoy specimens during the acute phase of infection. The lowest concentration of SARS-CoV-2 viral copies this assay can detect is 131 copies/mL. A negative result does not preclude SARS-Cov-2 infection and should not be used as the sole basis for treatment or other patient management decisions. A negative result may occur with  improper specimen collection/handling, submission of specimen other than nasopharyngeal swab, presence of viral mutation(s) within the areas targeted by this assay, and inadequate number of viral copies (<131 copies/mL). A negative result must be combined with clinical observations, patient history, and epidemiological information. The expected result is Negative. Fact Sheet for Patients:  PinkCheek.be Fact Sheet for Healthcare Providers:  GravelBags.it This test is not yet ap proved or cleared by the [place] FDA and  has been authorized for detection and/or diagnosis of SARS-CoV-2 by FDA under an Emergency Use Authorization (EUA). This EUA will remain  in effect (meaning this test can be used) for the duration of the COVID-19 declaration under Section 564(b)(1) of the Act, 21 U.S.C. section  360bbb-3(b)(1), unless the authorization is terminated or revoked sooner.    Influenza A by PCR NEGATIVE NEGATIVE Final   Influenza B by PCR NEGATIVE NEGATIVE Final    Comment: (NOTE) The Xpert Xpress SARS-CoV-2/FLU/RSV assay is intended as an aid in  the diagnosis of influenza from Nasopharyngeal swab specimens and  should not be used as a sole basis for treatment. Nasal washings and  aspirates are unacceptable for Xpert Xpress SARS-CoV-2/FLU/RSV  testing. Fact Sheet for Patients: PinkCheek.be Fact Sheet for Healthcare Providers: GravelBags.it This test is not yet approved or cleared by the [place] FDA and  has been authorized for detection and/or diagnosis of SARS-CoV-2 by  FDA under an Emergency Use Authorization (EUA). This EUA will remain  in effect (meaning this test can be used) for the duration of the  Covid-19 declaration under Section 564(b)(1) of the Act, 21  U.S.C. section 360bbb-3(b)(1), unless the authorization is  terminated or revoked. Performed at [facility], [address] [place], [place] [address]      Labs: CBC: Recent Labs  Lab 03/16/20 1612 03/17/20 0628 03/18/20 0522 03/19/20 0524  WBC 6.6 8.7 9.3 8.1  NEUTROABS 4.3  --   --   --   HGB 13.1 12.5* 11.3* 10.5*  HCT 39.0 38.2* 33.6* 30.8*  MCV 81.4 83.8 82.4 81.5  PLT 202 193 170 [id number]   Basic Metabolic Panel: Recent Labs  Lab 03/16/20 1612 03/17/20 0628 03/18/20 0522 03/19/20 0524  NA 136 133* 132* 132*  K 4.3 4.4 4.7 4.9  CL 105 102 102 101  CO2 24 25 25 25   GLUCOSE 123* 105* 166* 103*  BUN 16 15 16 23   CREATININE 1.02 0.95 1.05 1.23  CALCIUM 8.7* 8.0* 7.8* 7.9*   Liver Function Tests: Recent Labs  Lab 03/16/20 1612 03/17/20 0628  AST 20 17  ALT 13 14  ALKPHOS 59 54  BILITOT 0.6 1.4*  PROT 6.7 6.2*  ALBUMIN 3.8 3.3*   No results for input(s): LIPASE, AMYLASE in the last 168 hours. No results for  input(s): AMMONIA in the last 168 hours. Cardiac Enzymes: No results for input(s): CKTOTAL, CKMB, CKMBINDEX, TROPONINI  in the last 168 hours. BNP (last 3 results) No results for input(s): BNP in the last 8760 hours. CBG: No results for input(s): GLUCAP in the last 168 hours.  Time spent: 35 minutes  Signed:  Berle Mull  [facility] 03/19/2020 7:40 AM

## 2020-03-23 ENCOUNTER — Other Ambulatory Visit: Payer: Self-pay

## 2020-03-23 ENCOUNTER — Encounter: Payer: Self-pay | Admitting: Emergency Medicine

## 2020-03-23 ENCOUNTER — Emergency Department: Payer: Medicare Other

## 2020-03-23 ENCOUNTER — Emergency Department
Admission: EM | Admit: 2020-03-23 | Discharge: 2020-03-23 | Disposition: A | Discharge status: Home / Self Care | Payer: Medicare Other | Attending: Emergency Medicine | Admitting: Emergency Medicine

## 2020-03-23 DIAGNOSIS — R2241 Localized swelling, mass and lump, right lower limb: Secondary | ICD-10-CM | POA: Diagnosis present

## 2020-03-23 DIAGNOSIS — Z9889 Other specified postprocedural states: Secondary | ICD-10-CM | POA: Insufficient documentation

## 2020-03-23 DIAGNOSIS — Z7901 Long term (current) use of anticoagulants: Secondary | ICD-10-CM | POA: Diagnosis not present

## 2020-03-23 DIAGNOSIS — M7989 Other specified soft tissue disorders: Secondary | ICD-10-CM

## 2020-03-23 DIAGNOSIS — I10 Essential (primary) hypertension: Secondary | ICD-10-CM | POA: Insufficient documentation

## 2020-03-23 DIAGNOSIS — Z79899 Other long term (current) drug therapy: Secondary | ICD-10-CM | POA: Insufficient documentation

## 2020-03-23 LAB — PROTIME-INR
INR: 1.1 (ref 0.8–1.2)
Prothrombin Time: 14.1 seconds (ref 11.4–15.2)

## 2020-03-23 LAB — BASIC METABOLIC PANEL
Anion gap: 9 (ref 5–15)
BUN: 13 mg/dL (ref 8–23)
CO2: 27 mmol/L (ref 22–32)
Calcium: 8.6 mg/dL — ABNORMAL LOW (ref 8.9–10.3)
Chloride: 99 mmol/L (ref 98–111)
Creatinine, Ser: 0.97 mg/dL (ref 0.61–1.24)
GFR calc Af Amer: >60 mL/min (ref >60)
GFR calc non Af Amer: >60 mL/min (ref >60)
Glucose, Bld: 106 mg/dL — ABNORMAL HIGH (ref 70–99)
Potassium: 4.9 mmol/L (ref 3.5–5.1)
Sodium: 135 mmol/L (ref 135–145)

## 2020-03-23 LAB — CBC WITH DIFFERENTIAL/PLATELET
Abs Immature Granulocytes: 0.04 10*3/uL (ref 0.00–0.07)
Basophils Absolute: 0 10*3/uL (ref 0.0–0.1)
Basophils Relative: 0 %
Eosinophils Absolute: 0.1 10*3/uL (ref 0.0–0.5)
Eosinophils Relative: 2 %
HCT: 33.3 % — ABNORMAL LOW (ref 39.0–52.0)
Hemoglobin: 11.1 g/dL — ABNORMAL LOW (ref 13.0–17.0)
Immature Granulocytes: 1 %
Lymphocytes Relative: 22 %
Lymphs Abs: 1.6 10*3/uL (ref 0.7–4.0)
MCH: 27.1 pg (ref 26.0–34.0)
MCHC: 33.3 g/dL (ref 30.0–36.0)
MCV: 81.4 fL (ref 80.0–100.0)
Monocytes Absolute: 0.8 10*3/uL (ref 0.1–1.0)
Monocytes Relative: 11 %
Neutro Abs: 4.6 10*3/uL (ref 1.7–7.7)
Neutrophils Relative %: 64 %
Platelets: 290 10*3/uL (ref 150–400)
RBC: 4.09 MIL/uL — ABNORMAL LOW (ref 4.22–5.81)
RDW: 15.3 % (ref 11.5–15.5)
WBC: 7.2 10*3/uL (ref 4.0–10.5)
nRBC: 0 % (ref 0.0–0.2)

## 2020-03-23 NOTE — ED Triage Notes (Signed)
Pt presents to ED via POV with c/o leg swelling, was D/C several days ago after having R hip surgery, pt states now having RLE swelling after surgery.

## 2020-03-23 NOTE — ED Notes (Signed)
Pt taken to ultrasound

## 2020-03-23 NOTE — ED Provider Notes (Signed)
[facility] Provider Note ____________________________________________  Time seen: 1541  I have reviewed the triage vital signs Robert the nursing notes.  HISTORY  Chief Complaint  Leg Swelling  HPI Vidur Arcos is a 86 y.o. male with a history of A. Burch, Robert Burch, Robert Burch, Robert Burch, Robert Burch.  He was admitted to the hospital through the ED, Robert had an ORIF to the right hip on 3/28.  Patient apparently during his course had some elevated PT Robert INR values. He had a 5 mg IV dose of vitamin K pre-operatively Robert his Burch was held until post-op. He denies any interim complaints following discharge on 3/30, except for some right leg swelling.  He has 1 additional dose of his subcu Lovenox planned for tomorrow along with his nightly Burch dose of 2.5 mg.  Patient is scheduled to have a repeat INR check on Monday, Robert a follow-up visit with his primary provider on Wednesday.  He denies any fevers, chills, sweats, chest pain, SOB, or abnormal bleeding/bruising.   Past Medical History:  Diagnosis Date  . Atypical chest pain    a. Nonobstructive, very minor irregs by cath 2007;  b. 06/2009 Ex MV: small area of apical lateral ischemia;  c. 06/2009 Cath: essentially normal;  d. 04/2014 Lexiscan MV: EF 59%, no ischemia/infarct->low risk.  . Cancer ([place])   . Robert anticoagulation    a. Robert Burch  . Robert anxiety   . Diverticulosis   . Dysrhythmia    atrial Burch  . Burch (gastroesophageal reflux disease)   . Robert Burch   . IBS (irritable bowel syndrome)   . Nonischemic cardiomyopathy ([place])    a. EF 45% by echo 2010;  b. 05/2012 Echo: EF 55-65%, no rwma, Gr 1 DD, mild AI/MR, mildly dil LA.  . Paralyzed vocal cords   . Paroxysmal atrial fibrillation ([place])    a. Treated with flecainide  . Proteinuria     History of    Patient Active Problem List   Diagnosis Date Noted  . Closed right femoral Burch ([place]) 03/16/2020  . PAC (premature atrial contraction) 07/06/2019  . PVC (premature ventricular contraction) 07/06/2019  . Anemia 10/12/2018  . Sinus bradycardia 09/23/2018  . Coronary artery calcification seen on CAT scan 11/06/2017  . Diverticulum of bladder 11/26/2015  . Gross hematuria 10/29/2015  . Hyperlipidemia 04/22/2015  . Cervical radiculitis 05/22/2014  . DDD (degenerative disc disease), lumbar 05/22/2014  . Lumbar stenosis with neurogenic claudication 05/22/2014  . Paroxysmal atrial fibrillation (H[facility])   . Atypical chest pain   . Burch (gastroesophageal reflux disease)   . Elevated prostate specific antigen (PSA) 12/06/2013  . Anxiety 10/17/2013  . Benign localized hyperplasia of prostate with urinary obstruction 08/31/2013  . Robert prostatitis 08/31/2013  . ED (erectile dysfunction) of organic origin 08/31/2013  . Incomplete emptying of bladder 08/31/2013  . Syncope Robert collapse 12/07/2011  . Snoring 07/02/2011  . Abnormal respiratory rate 07/02/2011  . Essential Robert Burch 07/22/2009    Past Surgical History:  Procedure Laterality Date  . CARDIAC CATHETERIZATION    . cataract surgery Bilateral   . HIP ARTHROPLASTY Right 03/17/2020   Procedure: ARTHROPLASTY BIPOLAR HIP (HEMIARTHROPLASTY);  Surgeon: [name], MD;  Location: [facility] ORS;  Service: Orthopedics;  Laterality: Right;  . MASS BIOPSY  2013    Prior to Admission medications   Medication Sig Start  Date End Date Taking? Authorizing Provider  acidophilus (RISAQUAD) CAPS capsule Take 1 capsule by mouth daily.    [provider]  Cholecalciferol (VITAMIN D3) 400 UNITS CAPS Take 800 Units by mouth daily.     [provider]  Cobalamine Combinations (VITAMIN B12-FOLIC ACID) XX123456 MCG TABS Take 1 tablet by mouth daily.    [provider]  Coenzyme Q10 50 MG CAPS Take 50 mg by mouth  2 (two) times daily.     [provider]  Cranberry 500 MG CAPS Take 500 mg by mouth daily.    [provider]  diclofenac sodium (VOLTAREN) 1 % GEL Apply 2-4 g topically every 6 (six) hours as needed.  09/23/17   [provider]  diltiazem (CARDIZEM) 30 MG tablet Take 1 tablet (30 mg) by mouth once daily at bedtime 11/09/19   Deboraha Sprang, MD  docusate sodium (COLACE) 100 MG capsule Take 1 capsule (100 mg total) by mouth 2 (two) times daily. 03/19/20   Lavina Hamman, MD  enoxaparin (LOVENOX) 40 MG/0.4ML injection Inject 0.4 mLs (40 mg total) into the skin daily for 5 days. 03/20/20 03/25/20  Lavina Hamman, MD  finasteride (PROSCAR) 5 MG tablet Take 1 tablet (5 mg total) by mouth daily. 12/28/19   Billey Co, MD  Flaxseed, Linseed, (FLAX SEED OIL) 1000 MG CAPS Take 1,000 mg by mouth daily.     [provider]  flecainide (TAMBOCOR) 50 MG tablet Take 2 tablets (100 mg) by mouth twice daily as needed for recurrent atrial fibrillation 07/06/19   Deboraha Sprang, MD  Ginger 500 MG CAPS Take 500 mg by mouth 2 (two) times daily.    [provider]  hydrocortisone (ANUSOL-HC) 25 MG suppository Place 25 mg rectally daily as needed for hemorrhoids or anal itching.    [provider]  magnesium oxide (MAG-OX) 400 MG tablet TAKE 1 TABLET BY MOUTH TWICE A DAY Patient taking differently: Take 400 mg by mouth 2 (two) times daily.  03/04/20   Minna Merritts, MD  mirabegron ER (MYRBETRIQ) 25 MG TB24 tablet Take 1 tablet (25 mg total) by mouth daily. Patient taking differently: Take 25 mg by mouth at bedtime.  11/01/19   Billey Co, MD  Multiple Vitamin (MULTIVITAMIN) tablet Take 1 tablet by mouth daily.      [provider]  nitroGLYCERIN (NITROSTAT) 0.4 MG SL tablet PLACE 1 TABLET (0.4 MG TOTAL) UNDER THE TONGUE EVERY 5 (FIVE) MINUTES AS NEEDED FOR CHEST PAIN. 09/30/18   Deboraha Sprang, MD  Omega-3 Fatty Acids (FISH OIL) 1000 MG CAPS Take  1,000 mg by mouth daily.     [provider]  oxyCODONE (OXY IR/ROXICODONE) 5 MG immediate release tablet Take 0.5-1 tablets (2.5-5 mg total) by mouth every 4 (four) hours as needed for moderate pain (pain score 4-6). 03/19/20   Reche Dixon, PA-C  pantoprazole (PROTONIX) 20 MG tablet Take 20 mg by mouth daily.     [provider]  polyethylene glycol (MIRALAX / GLYCOLAX) packet Take 17 g by mouth daily as needed for mild constipation or moderate constipation.    [provider]  saw palmetto 160 MG capsule Take 160 mg by mouth 2 (two) times daily.    [provider]  tamsulosin (FLOMAX) 0.4 MG CAPS capsule Take 1 capsule (0.4 mg total) by mouth daily after supper. Patient taking differently: Take 0.4 mg by mouth 2 (two) times daily.  12/28/19  Billey Co, MD  traMADol (ULTRAM) 50 MG tablet Take 1 tablet (50 mg total) by mouth every 6 (six) hours as needed for moderate pain. 03/19/20   Reche Dixon, PA-C  warfarin (Burch) 1 MG tablet Take 1 tablet (1 mg total) by mouth daily for 1 day. Take total 3.5 mg daily on 03/19/2020 Robert 03/20/2020. 03/19/20 03/20/20  Lavina Hamman, MD  warfarin (Burch) 2.5 MG tablet Take 1 tablet (2.5 mg total) by mouth every evening. Take 3.5 mg daily total dose on 03/19/2020 Robert 03/20/2020. Start taking 2.5 mg daily from 03/21/2020. 03/19/20   Lavina Hamman, MD    Allergies Ciprofloxacin, Lanoxin [digoxin], Lopressor [metoprolol tartrate], Sulfonamide derivatives, Robert Verapamil  Family History  Problem Relation Age of Onset  . Heart attack Neg Hx     Social History Social History   Tobacco Use  . Smoking status: Never Smoker  . Smokeless tobacco: Never Used  Substance Use Topics  . Alcohol use: No    Alcohol/week: 2.0 standard drinks    Types: 1 Glasses of wine, 1 Standard drinks or equivalent per week    Comment: occassionally  . Drug use: No    Review of Systems  Constitutional: Negative for fever. Eyes:  Negative for visual changes. ENT: Negative for sore throat. Cardiovascular: Negative for chest pain. Respiratory: Negative for shortness of breath. Gastrointestinal: Negative for abdominal pain, vomiting Robert diarrhea. Genitourinary: Negative for dysuria. Musculoskeletal: Negative for back pain. Right leg swelling Skin: Negative for rash. Neurological: Negative for headaches, focal weakness or numbness. ____________________________________________  PHYSICAL EXAM:  VITAL SIGNS: ED Triage Vitals  Enc Vitals Group     BP 03/23/20 1334 (!) 145/75     Pulse Rate 03/23/20 1334 64     Resp 03/23/20 1334 18     Temp 03/23/20 1334 98.5 F (36.9 C)     Temp Source 03/23/20 1334 Oral     SpO2 03/23/20 1334 98 %     Weight 03/23/20 1333 191 lb 12.8 oz (87 kg)     Height 03/23/20 1333 5\' 10"  (1.778 m)     Head Circumference --      Peak Flow --      Pain Score 03/23/20 1333 5     Pain Loc --      Pain Edu? --      Excl. in [place]? --     Constitutional: Alert Robert oriented. Well appearing Robert in no distress. Head: Normocephalic Robert atraumatic. Eyes: Conjunctivae are normal. Normal extraocular movements Cardiovascular: Normal rate, regular rhythm. Normal distal pulses.  Patient with 2+ pitting edema on the right lower extremity Robert 1+ pitting edema on the left lower extremity. Respiratory: Normal respiratory effort. No wheezes/rales/rhonchi. Gastrointestinal: Soft Robert nontender. No distention. Musculoskeletal: Right lower extremity with a lateral thigh honeycomb dressing in place.  No active bleeding is appreciated.  There is surrounding ecchymosis Robert swelling consistent with recent ORIF.  Patient with active flexion extension range of the right hip Robert the right knee.  Nontender with normal range of motion in all extremities.  Neurologic:  Normal gross sensation. Normal speech Robert language. No gross focal neurologic deficits are appreciated. Skin:  Skin is warm, dry Robert intact. No rash  noted. Psychiatric: Mood Robert affect are normal. Patient exhibits appropriate insight Robert judgment. ____________________________________________   LABS (pertinent positives/negatives)  Labs Reviewed  CBC WITH DIFFERENTIAL/PLATELET - Abnormal; Notable for the following components:      Result Value   RBC 4.09 (*)  Hemoglobin 11.1 (*)    HCT 33.3 (*)    All other components within normal limits  BASIC METABOLIC PANEL - Abnormal; Notable for the following components:   Glucose, Bld 106 (*)    Calcium 8.6 (*)    All other components within normal limits  PROTIME-INR  ____________________________________________   RADIOLOGY  US Venous RLE  IMPRESSION: No femoropopliteal DVT nor evidence of DVT within the visualized calf veins. ____________________________________________  PROCEDURES  Procedures ____________________________________________  INITIAL IMPRESSION / ASSESSMENT Robert PLAN / ED COURSE  Patient with ED evaluation of right leg swelling in the postop setting.  Patient is 6-day status post a right ORIF for a femur neck Burch.  He also has been on subcu Lovenox therapy to bridge him from inpatient to outpatient therapy.  His Burch has been changed to 2.5 mg nightly, Robert he has a ankle dose of Lovenox remaining for tomorrow.  Patient's INR today was 1.1, which is consistent with his INR checked 2 days ago.  His ultrasound is negative for any acute DVT.  Patient is wife are reassured at this time.  Exam findings are consistent with some postop lower extremity edema.  Patient is encouraged to continue with medication therapy Robert have repeat labs on Monday as planned.  He is also encouraged to perform range of motion exercises to reduce lower extremity swelling.  Patient is wife verbalized understanding of discharge instructions are reassured by the exam Robert ultrasound findings today.  They will return to the ED if necessary but otherwise follow-up as planned.  Adelfo Palinkas was evaluated in Emergency Department on 03/23/2020 for the symptoms described in the history of present illness. He was evaluated in the context of the global COVID-19 pandemic, which necessitated consideration that the patient might be at risk for infection with the SARS-CoV-2 virus that causes COVID-19. Institutional protocols Robert algorithms that pertain to the evaluation of patients at risk for COVID-19 are in a state of rapid change based on information released by regulatory bodies including the CDC Robert federal Robert state organizations. These policies Robert algorithms were followed during the patient's care in the ED. ____________________________________________  FINAL CLINICAL IMPRESSION(S) / ED DIAGNOSES  Final diagnoses:  Right leg swelling      Braidan Ricciardi, Dannielle Karvonen, PA-C 03/23/20 1720    Nance Pear, MD 03/23/20 [phone]

## 2020-03-23 NOTE — Discharge Instructions (Addendum)
Your exam, labs, and [place] are reassuring at this time. Your INR is 1.1 today. You should continue with your planned doses of Lovenox and Coumadin. Follow-up with Dr. Lovie Macadamia as scheduled next week. Rest with the legs elevated, but practice leg and ankle movements as demonstrated.

## 2020-05-23 ENCOUNTER — Ambulatory Visit (INDEPENDENT_AMBULATORY_CARE_PROVIDER_SITE_OTHER): Payer: Medicare Other | Admitting: Internal Medicine

## 2020-05-23 ENCOUNTER — Encounter: Payer: Self-pay | Admitting: Internal Medicine

## 2020-05-23 ENCOUNTER — Other Ambulatory Visit: Payer: Self-pay

## 2020-05-23 VITALS — BP 160/90 | HR 61 | Ht 69.0 in | Wt 190.5 lb

## 2020-05-23 DIAGNOSIS — I480 Paroxysmal atrial fibrillation: Secondary | ICD-10-CM | POA: Diagnosis not present

## 2020-05-23 DIAGNOSIS — I493 Ventricular premature depolarization: Secondary | ICD-10-CM

## 2020-05-23 DIAGNOSIS — I951 Orthostatic hypotension: Secondary | ICD-10-CM

## 2020-05-23 DIAGNOSIS — I491 Atrial premature depolarization: Secondary | ICD-10-CM

## 2020-05-23 DIAGNOSIS — R001 Bradycardia, unspecified: Secondary | ICD-10-CM

## 2020-05-23 NOTE — Progress Notes (Signed)
Patient Care Team: [name], MD as PCP - General (Family Medicine) Deboraha Sprang, MD as Consulting Physician (Cardiology) Minna Merritts, MD as Consulting Physician (Cardiology)   HPI  Robert Burch is a 86 y.o. male Seen in followup for paroxysmal atrial fibrillation in the setting of nonischemic coronary disease; Managed with flecainide taking regularly, but more recently prn. Previously referred to Dr Clearnce Hasten for consideration of pulm vein isolation; pt declined ablation anticoagulation with warfarin  .  Noted to have bradycardia with heart rates in the 40s.  Not clearly symptomatic.  Developed some dyspnea.  At his request undertook exclusion trial of flecainide as the cause of his dyspnea-- felt some what better, and after telephone call elected to try and use his flecainide prn--with which he had less dyspnea. Interval atrial fibrillation for which he took flecainide 50 mg x 2 with resolution within a few hours.  And the plan has been to use as needed flecainide.  Was started on Myrbetriq.  This is been associated with elevated blood pressure.  The dose was decreased from 50--25 mg; blood pressures have remained modestly elevated.  Previously valsartan was discontinued because of orthostatic hypotension with improvement in symptoms and relatively stable systolic blood pressures  Fell and broke a hip 3/21.  Tripped.  Has had problems with lightheadedness worse in the morning, upon arising..  Takes Flomax in the morning.  And things worsen.  Intermittent brief episodes of tachypalpitations lasting just seconds.  On warfarin without bleeding.  No shortness of breath or chest pain.  DATE TEST    7/16    Echo   EF 55-60 % Mild LAE  7/16    myoview   EF 35 % No ischemia-gut artifact         DATE PR interval QRSduration Dose-BID  3/10  150 114 50  5/18 180 114 75  5/19 184 116 75   Date Cr K Hgb  5/19 1.0 5.2 13.1`  8/20 0.9 4.4 13.3  4/21  0.97 4.9 11.1       Records and Results Reviewed notes from Dr. Deidre Ala Holter monitors  Past Medical History:  Diagnosis Date  . Atypical chest pain    a. Nonobstructive, very minor irregs by cath 2007;  b. 06/2009 Ex MV: small area of apical lateral ischemia;  c. 06/2009 Cath: essentially normal;  d. 04/2014 Lexiscan MV: EF 59%, no ischemia/infarct->low risk.  . Cancer ([place])   . Chronic anticoagulation    a. Chronic Coumadin  . Chronic anxiety   . Diverticulosis   . Dysrhythmia    atrial fib  . GERD (gastroesophageal reflux disease)   . Hypertension   . IBS (irritable bowel syndrome)   . Nonischemic cardiomyopathy ([place])    a. EF 45% by echo 2010;  b. 05/2012 Echo: EF 55-65%, no rwma, Gr 1 DD, mild AI/MR, mildly dil LA.  . Paralyzed vocal cords   . Paroxysmal atrial fibrillation ([place])    a. Treated with flecainide  . Proteinuria    History of    Past Surgical History:  Procedure Laterality Date  . CARDIAC CATHETERIZATION    . cataract surgery Bilateral   . HIP ARTHROPLASTY Right 03/17/2020   Procedure: ARTHROPLASTY BIPOLAR HIP (HEMIARTHROPLASTY);  Surgeon: [name], MD;  Location: [facility] ORS;  Service: Orthopedics;  Laterality: Right;  . MASS BIOPSY  2013    Current Outpatient Medications  Medication Sig Dispense Refill  . acidophilus (RISAQUAD) CAPS capsule  Take 1 capsule by mouth daily.    . Cholecalciferol (VITAMIN D3) 400 UNITS CAPS Take 800 Units by mouth daily.     . Cobalamine Combinations (VITAMIN B12-FOLIC ACID) XX123456 MCG TABS Take 1 tablet by mouth daily.    . Coenzyme Q10 50 MG CAPS Take 50 mg by mouth 2 (two) times daily.     . Cranberry 500 MG CAPS Take 500 mg by mouth daily.    . diclofenac sodium (VOLTAREN) 1 % GEL Apply 2-4 g topically every 6 (six) hours as needed.     . diltiazem (CARDIZEM) 30 MG tablet Take 1 tablet (30 mg) by mouth once daily at bedtime 30 tablet 11  . docusate sodium (COLACE) 100 MG capsule Take 1 capsule (100 mg total) by mouth 2 (two)  times daily. 10 capsule 0  . finasteride (PROSCAR) 5 MG tablet Take 1 tablet (5 mg total) by mouth daily. 30 tablet 11  . Flaxseed, Linseed, (FLAX SEED OIL) 1000 MG CAPS Take 1,000 mg by mouth daily.     . flecainide (TAMBOCOR) 50 MG tablet Take 2 tablets (100 mg) by mouth twice daily as needed for recurrent atrial fibrillation 12 tablet 3  . Ginger 500 MG CAPS Take 500 mg by mouth 2 (two) times daily.    . hydrocortisone (ANUSOL-HC) 25 MG suppository Place 25 mg rectally daily as needed for hemorrhoids or anal itching.    . magnesium oxide (MAG-OX) 400 MG tablet TAKE 1 TABLET BY MOUTH TWICE A DAY (Patient taking differently: Take 400 mg by mouth 2 (two) times daily. ) 180 tablet 0  . mirabegron ER (MYRBETRIQ) 25 MG TB24 tablet Take 1 tablet (25 mg total) by mouth daily. (Patient taking differently: Take 25 mg by mouth at bedtime. ) 30 tablet 11  . Multiple Vitamin (MULTIVITAMIN) tablet Take 1 tablet by mouth daily.      . nitroGLYCERIN (NITROSTAT) 0.4 MG SL tablet PLACE 1 TABLET (0.4 MG TOTAL) UNDER THE TONGUE EVERY 5 (FIVE) MINUTES AS NEEDED FOR CHEST PAIN. 90 tablet 0  . Omega-3 Fatty Acids (FISH OIL) 1000 MG CAPS Take 1,000 mg by mouth daily.     Marland Kitchen oxyCODONE (OXY IR/ROXICODONE) 5 MG immediate release tablet Take 0.5-1 tablets (2.5-5 mg total) by mouth every 4 (four) hours as needed for moderate pain (pain score 4-6). 30 tablet 0  . pantoprazole (PROTONIX) 20 MG tablet Take 20 mg by mouth daily.     . polyethylene glycol (MIRALAX / GLYCOLAX) packet Take 17 g by mouth daily as needed for mild constipation or moderate constipation.    . saw palmetto 160 MG capsule Take 160 mg by mouth 2 (two) times daily.    . tamsulosin (FLOMAX) 0.4 MG CAPS capsule Take 1 capsule (0.4 mg total) by mouth daily after supper. (Patient taking differently: Take 0.4 mg by mouth 2 (two) times daily. ) 180 capsule 2  . traMADol (ULTRAM) 50 MG tablet Take 1 tablet (50 mg total) by mouth every 6 (six) hours as needed for  moderate pain. 30 tablet 0  . warfarin (COUMADIN) 1 MG tablet Take 1 tablet (1 mg total) by mouth daily for 1 day. Take total 3.5 mg daily on 03/19/2020 and 03/20/2020. 2 tablet 0  . warfarin (COUMADIN) 2.5 MG tablet Take 1 tablet (2.5 mg total) by mouth every evening. Take 3.5 mg daily total dose on 03/19/2020 and 03/20/2020. Start taking 2.5 mg daily from 03/21/2020. (Patient not taking: Reported on 05/23/2020)  No current facility-administered medications for this visit.    Allergies  Allergen Reactions  . Ciprofloxacin     Diarrhea and stomach pains  . Lanoxin [Digoxin]   . Lopressor [Metoprolol Tartrate]   . Sulfonamide Derivatives     Rash   . Verapamil       Review of Systems negative except from HPI and PMH  Physical Exam BP (!) 160/90 (BP Location: Left Arm, Patient Position: Sitting, Cuff Size: Normal)   Pulse 61   Ht 5\' 9"  (1.753 m)   Wt 190 lb 8 oz (86.4 kg)   SpO2 97%   BMI 28.13 kg/m    Well developed and nourished in no acute distress HENT normal Neck supple with JVP-  flat *  Clear Regular rate and rhythm, no murmurs or gallops Abd-soft with active BS No Clubbing cyanosis edema Skin-warm and dry A & Oriented  Grossly normal sensory and motor function  ECG sinus @ 61 16/10/41   Assessment and  Plan  Atrial fibrillation-paroxysmal  PACs/PVCs  Sinus bradycardia  Hypertension  Orthostatic hypotension   Anemia post op   Continue prn flecainide for atrial fib;   On Anticoagulation;  No bleeding issues   Consider Fe replacement    We discussed the physiology of orthostatic intolerance including gravitational fluid shifts and the impact of hypertensive vascular disease on orthostasis and treatment options.  We discussed nonpharmacological options including raising the HOB, isometric contraction upon standing, abdominal binders  thigh sleeves. We emphasized the importance of recognizing the prodrome  We will begin with a non pharmacological  approach as above  In addition, we will have him take his Flomax at the midday.    Marland Kitchen

## 2020-05-23 NOTE — Patient Instructions (Signed)
Medication Instructions:  Your physician recommends that you continue on your current medications as directed. Please refer to the Current Medication list given to you today.  *If you need a refill on your cardiac medications before your next appointment, please call your pharmacy*  Follow-Up: At [facility], you and your health needs are our priority.  As part of our continuing mission to provide you with exceptional heart care, we have created designated Provider Care Teams.  These Care Teams include your primary Cardiologist (physician) and Advanced Practice Providers (APPs -  Physician Assistants and Nurse Practitioners) who all work together to provide you with the care you need, when you need it.  We recommend signing up for the patient portal called "MyChart".  Sign up information is provided on this After Visit Summary.  MyChart is used to connect with patients for Virtual Visits (Telemedicine).  Patients are able to view lab/test results, encounter notes, upcoming appointments, etc.  Non-urgent messages can be sent to your provider as well.   To learn more about what you can do with MyChart, go to [email or web address].    Your next appointment:   12 month(s) with Dr [name]  6 months with Dr Rockey Situ  The format for your next appointment:   In Person  Provider:   Virl Axe, MD   Other Instructions  Dr [name] recommends the following: 1- Keep head of bed elevated at 4 inches. 2- Take Flomax at lunch time. 3- Keep water at the bed side for in the morning upon waking. 4- Use Abdominal binder and thigh sleeves during your waking hours. You can purchase these on [place] or drug store.

## 2020-05-31 NOTE — Addendum Note (Signed)
Addended by: Anselm Pancoast on: 05/31/2020 08:44 AM   Modules accepted: Orders

## 2020-06-02 ENCOUNTER — Other Ambulatory Visit: Payer: Self-pay | Admitting: Cardiovascular Disease

## 2020-07-16 DIAGNOSIS — M810 Age-related osteoporosis without current pathological fracture: Secondary | ICD-10-CM | POA: Insufficient documentation

## 2020-08-14 ENCOUNTER — Other Ambulatory Visit: Payer: Self-pay | Admitting: Cardiovascular Disease

## 2020-09-12 ENCOUNTER — Other Ambulatory Visit: Payer: Self-pay | Admitting: Urology

## 2020-09-24 ENCOUNTER — Ambulatory Visit: Payer: Medicare Other | Admitting: Physician Assistant

## 2020-09-25 ENCOUNTER — Telehealth: Payer: Self-pay | Admitting: Cardiovascular Disease

## 2020-09-25 NOTE — Telephone Encounter (Signed)
Patient's wife tested positive for COVID on 09/18/20. If you count 10 days from the patient's known date of exposure, this will be 09/29/20. His follow up appointment is on 09/30/20 with Robert [place], NP  I reviewed the above with [facility] and she advised, if the patient has been asymptomatic since his wife tested positive he should be ok to come for his visit. If he is having symptoms, he should get tested for COVID and consider MCA therapy.  I spoke with the patient. He states he developed lightheadedness about 5 days ago and this is unchanged for him over the last few days. Robert Burch made aware and advised he should be COVID tested at this time so he does not fall outside of his 10 day window for consideration of MCA infusion if he is COVID positive.  I have advised the patient of the above information.  He is aware he may get same day COVID testing a [facility] on [place]. He is agreeable to trying his local [facility] for testing.   The patient is aware I will not cancel his appointment for Monday 09/30/20 with Robert Burch until he notifies [place] of his COVID testing results.  The patient voices understanding and is agreeable.

## 2020-09-25 NOTE — Telephone Encounter (Signed)
Patient wife has covid and thinks the test date was 9/29.  Patient recently rescheduled .  Please advise if 10-11 appt is ok or if he should reschedule further out       COVID-19 Pre-Screening Questions:  . In the past 7 to 10 days have you had a cough,  shortness of breath, headache, congestion, fever (100 or greater) body aches, chills, sore throat, or sudden loss of taste or sense of smell?NO . Have you been around anyone with known Covid 19. YES . Have you been around anyone who is awaiting Covid 19 test results in the past 7 to 10 days? YES . Have you been around anyone who has been exposed to Covid 19, or has mentioned symptoms of Covid 19 within the past 7 to 10 days? YES  If you have any concerns/questions about symptoms patients report during screening (either on the phone or at threshold). Contact the provider seeing the patient or DOD for further guidance.  If neither are available contact a member of the leadership team.

## 2020-09-26 NOTE — Telephone Encounter (Signed)
Patient unable to get tested for covid until 10-11   Rescheduled 10-15 with Caitlin at 11 am.    Patient advised to call back when covid results received

## 2020-09-30 ENCOUNTER — Ambulatory Visit: Payer: Medicare Other | Admitting: Family

## 2020-09-30 ENCOUNTER — Other Ambulatory Visit: Payer: Medicare Other

## 2020-09-30 DIAGNOSIS — Z20822 Contact with and (suspected) exposure to covid-19: Secondary | ICD-10-CM

## 2020-10-02 LAB — SARS-COV-2, NAA 2 DAY TAT

## 2020-10-02 LAB — NOVEL CORONAVIRUS, NAA: SARS-CoV-2, NAA: NOT DETECTED

## 2020-10-04 ENCOUNTER — Ambulatory Visit: Payer: Medicare Other | Admitting: Family

## 2020-10-04 ENCOUNTER — Other Ambulatory Visit: Payer: Self-pay

## 2020-10-04 ENCOUNTER — Encounter: Payer: Self-pay | Admitting: Family

## 2020-10-04 VITALS — BP 176/82 | HR 63 | Ht 69.0 in | Wt 189.0 lb

## 2020-10-04 DIAGNOSIS — I428 Other cardiomyopathies: Secondary | ICD-10-CM

## 2020-10-04 DIAGNOSIS — I480 Paroxysmal atrial fibrillation: Secondary | ICD-10-CM

## 2020-10-04 DIAGNOSIS — I493 Ventricular premature depolarization: Secondary | ICD-10-CM | POA: Diagnosis not present

## 2020-10-04 DIAGNOSIS — I951 Orthostatic hypotension: Secondary | ICD-10-CM

## 2020-10-04 DIAGNOSIS — I491 Atrial premature depolarization: Secondary | ICD-10-CM | POA: Diagnosis not present

## 2020-10-04 NOTE — Progress Notes (Signed)
Office Visit    Patient Name: Robert Burch Date of Encounter: 10/04/2020  Primary Care Provider:  [name], MD Primary Cardiologist:  Ida Rogue, MD Electrophysiologist:  Virl Axe, MD   Chief Complaint    Robert Burch is a 86 y.o. male with a hx of PAF, nonischemic cardiomyopathy, chronic anticoagulation on warfarin, PAC/PVC, sinus bradycardia, HTN, orthostatic hypotension, postoperative anemia presents today for lightheadedness   Past Medical History    Past Medical History:  Diagnosis Date  . Atypical chest pain    a. Nonobstructive, very minor irregs by cath 2007;  b. 06/2009 Ex MV: small area of apical lateral ischemia;  c. 06/2009 Cath: essentially normal;  d. 04/2014 Lexiscan MV: EF 59%, no ischemia/infarct->low risk.  . Cancer ([place])   . Chronic anticoagulation    a. Chronic Coumadin  . Chronic anxiety   . Diverticulosis   . Dysrhythmia    atrial fib  . GERD (gastroesophageal reflux disease)   . Hypertension   . IBS (irritable bowel syndrome)   . Nonischemic cardiomyopathy ([place])    a. EF 45% by echo 2010;  b. 05/2012 Echo: EF 55-65%, no rwma, Gr 1 DD, mild AI/MR, mildly dil LA.  . Paralyzed vocal cords   . Paroxysmal atrial fibrillation ([place])    a. Treated with flecainide  . Proteinuria    History of   Past Surgical History:  Procedure Laterality Date  . CARDIAC CATHETERIZATION    . cataract surgery Bilateral   . HIP ARTHROPLASTY Right 03/17/2020   Procedure: ARTHROPLASTY BIPOLAR HIP (HEMIARTHROPLASTY);  Surgeon: [name], MD;  Location: [facility] ORS;  Service: Orthopedics;  Laterality: Right;  . MASS BIOPSY  2013    Allergies  Allergies  Allergen Reactions  . Ciprofloxacin     Diarrhea and stomach pains  . Lanoxin [Digoxin]   . Lopressor [Metoprolol Tartrate]   . Penicillins     Other reaction(s): Unknown  . Sulfonamide Derivatives     Rash   . Verapamil   . Sulfa Antibiotics Rash    History of Present Illness      Robert Burch is a 86 y.o. male with a hx of PAF, nonischemic cardiomyopathy, chronic anticoagulation on warfarin, PAC/PVC, sinus bradycardia, HTN, orthostatic hypotension, postoperative anemia last seen 05/23/2020 by Dr. [name].  Previous echocardiogram 06/2015 with LVEF 55 to 60% and mild left atrial enlargement.  He had Lexiscan Myoview 06/2015 with no ischemia.  He has been followed by EP for PAF.  He has been managed on flecainide.  Previously referred to Dr. Rayann Heman for consideration of pulmonary vein isolation, patient declined ablation.  He previously has noted some dyspnea which she felt improved after switching his flecainide to as needed.  Per Dr. Olin Pia documentation he was previously on 50mg  Myrbetriz which was associated with elevated BP and dose decreased to 25mg  with resolution of HTN. More recently he has had difficulties with orthostatic hypotension. Valsartan has been previously discontinued.  Last seen by Dr. [name] 05/22/2020.  At that time he was recommended to take his Flomax at the mid day to prevent orthostatic hypotension.    Seen by PCP 09/11/2020 with 1 week history of headaches.  Also noted mild lightheadedness and some stomach upset.  Labs today care everywhere 09/11/2020 hemoglobin 12.4 (stable compared to previous), WBC 7.0, Na 132, K4.6, GFR 80, creatinine 0.9, glucose 79  He called the office 09/25/2020 noting that his wife had Covid and he was wondering whether to keep his 09/30/2020  appointment.  He was rescheduled per CDC guidelines.  He was also recommended to get Covid testing.    Presents today for follow-up. Chief complaint is that he wonders if anything can be done about his lightheadedness. Tells me after seeing Dr. [name] he wore abdominal provider for a few weeks. Tells me he noticed significant constipation and had to stop wearing. He has seen PCP for constipation this issue has resolved. He does not wear compression stockings. Endorse that he does not stay  well-hydrated and his children have been encouraging him to drink more water. Lightheadedness occurs while sitting and also with position changes. No dizziness, near-syncope, syncope. Monitor BP carefully at home and bring the log. Average BP 130s over 80s. Denies recurrent atrial fibrillation. Has not required dose of flecainide.   EKGs/Labs/Other Studies Reviewed:   The following studies were reviewed today:  EKG:  EKG is  ordered today.  The ekg ordered today demonstrates 63 bpm with sinus arrhythmia and no acute ST/T wave changes.   Recent Labs: 03/17/2020: ALT 14 03/23/2020: BUN 13; Creatinine, Ser 0.97; Hemoglobin 11.1; Platelets 290; Potassium 4.9; Sodium 135  Recent Lipid Panel No results found for: CHOL, TRIG, HDL, CHOLHDL, VLDL, LDLCALC, LDLDIRECT  Risk Assessment/Calculations:  { CHA2DS2-VASc Score = 4  This indicates a 4.8% annual risk of stroke. The patient's score is based upon: CHF History: 1 HTN History: 1 Diabetes History: 0 Stroke History: 0 Vascular Disease History: 0 Age Score: 2 Gender Score: 0    Home Medications   Current Meds  Medication Sig  . acidophilus (RISAQUAD) CAPS capsule Take 1 capsule by mouth daily.  . bisacodyl (DULCOLAX) 5 MG EC tablet Take 2 tablets (10 mg) once around 10 AM tomorrow  . Cholecalciferol (VITAMIN D3) 400 UNITS CAPS Take 800 Units by mouth daily.   . clotrimazole-betamethasone (LOTRISONE) cream Apply topically 2 (two) times daily.  . Cobalamine Combinations (VITAMIN B12-FOLIC ACID) 974-163 MCG TABS Take 1 tablet by mouth daily.  . Coenzyme Q10 50 MG CAPS Take 50 mg by mouth 2 (two) times daily.   . Cranberry 500 MG CAPS Take 500 mg by mouth daily.  . diclofenac sodium (VOLTAREN) 1 % GEL Apply 2-4 g topically every 6 (six) hours as needed.   . diltiazem (CARDIZEM) 30 MG tablet Take 1 tablet (30 mg) by mouth once daily at bedtime  . docusate sodium (COLACE) 100 MG capsule Take 1 capsule (100 mg total) by mouth 2 (two) times  daily.  . finasteride (PROSCAR) 5 MG tablet Take 1 tablet (5 mg total) by mouth daily.  . Flaxseed, Linseed, (FLAX SEED OIL) 1000 MG CAPS Take 1,000 mg by mouth daily.   . flecainide (TAMBOCOR) 50 MG tablet Take 2 tablets (100 mg) by mouth twice daily as needed for recurrent atrial fibrillation  . fluticasone (FLONASE) 50 MCG/ACT nasal spray Place into the nose.  . Ginger 500 MG CAPS Take 500 mg by mouth 2 (two) times daily.  . hydrocortisone (ANUSOL-HC) 25 MG suppository Place 25 mg rectally daily as needed for hemorrhoids or anal itching.  . magnesium oxide (MAG-OX) 400 MG tablet TAKE 1 TABLET BY MOUTH TWICE A DAY  . mirabegron ER (MYRBETRIQ) 25 MG TB24 tablet Take 1 tablet (25 mg total) by mouth daily. (Patient taking differently: Take 25 mg by mouth at bedtime. )  . Multiple Vitamin (MULTIVITAMIN) tablet Take 1 tablet by mouth daily.    . nitroGLYCERIN (NITROSTAT) 0.4 MG SL tablet PLACE 1 TABLET (0.4 MG TOTAL)  UNDER THE TONGUE EVERY 5 (FIVE) MINUTES AS NEEDED FOR CHEST PAIN.  Marland Kitchen Omega-3 Fatty Acids (FISH OIL) 1000 MG CAPS Take 1,000 mg by mouth daily.   Marland Kitchen oxyCODONE (OXY IR/ROXICODONE) 5 MG immediate release tablet Take 0.5-1 tablets (2.5-5 mg total) by mouth every 4 (four) hours as needed for moderate pain (pain score 4-6).  . pantoprazole (PROTONIX) 20 MG tablet Take 20 mg by mouth daily.   . polyethylene glycol (MIRALAX / GLYCOLAX) packet Take 17 g by mouth daily as needed for mild constipation or moderate constipation.  . predniSONE (DELTASONE) 10 MG tablet Take 10 mg by mouth daily.  . saw palmetto 160 MG capsule Take 160 mg by mouth 2 (two) times daily.  . sucralfate (CARAFATE) 1 g tablet Take by mouth.  . tamsulosin (FLOMAX) 0.4 MG CAPS capsule TAKE 1 CAPSULE (0.4 MG TOTAL) BY MOUTH 2 (TWO) TIMES DAILY.  . traMADol (ULTRAM) 50 MG tablet Take 1 tablet (50 mg total) by mouth every 6 (six) hours as needed for moderate pain.  Marland Kitchen warfarin (COUMADIN) 2.5 MG tablet Take 1 tablet (2.5 mg total)  by mouth every evening. Take 3.5 mg daily total dose on 03/19/2020 and 03/20/2020. Start taking 2.5 mg daily from 03/21/2020.     Review of Systems   All other systems reviewed and are otherwise negative except as noted above.  Physical Exam    VS:  BP (!) 176/82 (BP Location: Left Arm, Patient Position: Sitting, Cuff Size: Normal)   Pulse 63   Ht 5\' 9"  (1.753 m)   Wt 189 lb (85.7 kg)   SpO2 96%   BMI 27.91 kg/m  , BMI Body mass index is 27.91 kg/m.  Wt Readings from Last 3 Encounters:  10/04/20 189 lb (85.7 kg)  05/23/20 190 lb 8 oz (86.4 kg)  03/23/20 191 lb 12.8 oz (87 kg)    GEN: Well nourished, well developed, in no acute distress. HEENT: normal. Neck: Supple, no JVD, carotid bruits, or masses. Cardiac: RRR, no murmurs, rubs, or gallops. No clubbing, cyanosis, edema.  Radials/DP/PT 2+ and equal bilaterally.  Respiratory:  Respirations regular and unlabored, clear to auscultation bilaterally. GI: Soft, nontender, nondistended. MS: No deformity or atrophy. Skin: Warm and dry, no rash. Neuro:  Strength and sensation are intact. Psych: Normal affect.  Assessment & Plan   1. PAF -maintaining NSR.  Reports no recurrent palpitations.  Not using as needed flecainide.  Continue Myrbetriq.  2. Chronic anticoagulation -Denies bleeding complications.  Recent hemoglobin with primary care stable.CHA2DS2-VASc Score = 4 [CHF History: 1, HTN History: 1, Diabetes History: 0, Stroke History: 0, Vascular Disease History: 0, Age Score: 2, Gender Score: 0].  Therefore, the patient's annual risk of stroke is 4.8 %.   Continue warfarin-managed by primary care provider.  3. Orthostatic hypotension -likely etiology of majority of her lightheadedness.  He is notable blood pressure drop and orthostatic vitals today.  He is hypertensive in clinic however home BP readings average 130s/80s.  Anticipate his Flomax as well as Proscar can be contributory to orthostatic hypotension.  He was previously  intolerant of abdominal binder-he tells me it caused constipation.  Recommend compression stockings.  Recommend staying well-hydrated.  Echocardiogram ordered to assess for any significant valvular abnormalities.  Disposition: Follow up In December as previously scheduled with Dr. Rockey Situ  Signed, Loel Dubonnet, NP 10/04/2020, 1:05 PM [facility]

## 2020-10-04 NOTE — Patient Instructions (Addendum)
Medication Instructions:  No medication changes today.  *If you need a refill on your cardiac medications before your next appointment, please call your pharmacy*  Lab Work: None ordered today.   Your recent lab work with Dr. Lovie Macadamia looked great!  Testing/Procedures: Your physician has requested that you have an echocardiogram. Echocardiography is a painless test that uses sound waves to create images of your heart. It provides your doctor with information about the size and shape of your heart and how well your heart's chambers and valves are working. This procedure takes approximately one hour. There are no restrictions for this procedure.  Follow-Up: At [facility], you and your health needs are our priority.  As part of our continuing mission to provide you with exceptional heart care, we have created designated Provider Care Teams.  These Care Teams include your primary Cardiologist (physician) and Advanced Practice Providers (APPs -  Physician Assistants and Nurse Practitioners) who all work together to provide you with the care you need, when you need it.  Your next appointment:   In December with Dr. Rockey Situ as previously scheduled.  Other Instructions   Recommend wearing compression stockings during the daytime.  Recommend increasing your water intake. You could also drink some Gatorade or Propel.  Orthostatic Hypotension Blood pressure is a measurement of how strongly, or weakly, your blood is pressing against the walls of your arteries. Orthostatic hypotension is a sudden drop in blood pressure that happens when you quickly change positions, such as when you get up from sitting or lying down. Arteries are blood vessels that carry blood from your heart throughout your body. When blood pressure is too low, you may not get enough blood to your brain or to the rest of your organs. This can cause weakness, light-headedness, rapid heartbeat, and fainting. This can last for just  a few seconds or for up to a few minutes. Orthostatic hypotension is usually not a serious problem. However, if it happens frequently or gets worse, it may be a sign of something more serious. What are the causes? This condition may be caused by:  Sudden changes in posture, such as standing up quickly after you have been sitting or lying down.  Blood loss.  Loss of body fluids (dehydration).  Heart problems.  Hormone (endocrine) problems.  Pregnancy.  Severe infection.  Lack of certain nutrients.  Severe allergic reactions (anaphylaxis).  Certain medicines, such as blood pressure medicine or medicines that make the body lose excess fluids (diuretics). Sometimes, this condition can be caused by not taking medicine as directed, such as taking too much of a certain medicine. What increases the risk? The following factors may make you more likely to develop this condition:  Age. Risk increases as you get older.  Conditions that affect the heart or the central nervous system.  Taking certain medicines, such as blood pressure medicine or diuretics.  Being pregnant. What are the signs or symptoms? Symptoms of this condition may include:  Weakness.  Light-headedness.  Dizziness.  Blurred vision.  Fatigue.  Rapid heartbeat.  Fainting, in severe cases. How is this diagnosed? This condition is diagnosed based on:  Your medical history.  Your symptoms.  Your blood pressure measurement. Your health care provider will check your blood pressure when you are: ? Lying down. ? Sitting. ? Standing. A blood pressure reading is recorded as two numbers, such as "120 over 80" (or 120/80). The first ("top") number is called the systolic pressure. It is a measure of  the pressure in your arteries as your heart beats. The second ("bottom") number is called the diastolic pressure. It is a measure of the pressure in your arteries when your heart relaxes between beats. Blood pressure is  measured in a unit called mm Hg. Healthy blood pressure for most adults is 120/80. If your blood pressure is below 90/60, you may be diagnosed with hypotension. Other information or tests that may be used to diagnose orthostatic hypotension include:  Your other vital signs, such as your heart rate and temperature.  Blood tests.  Tilt table test. For this test, you will be safely secured to a table that moves you from a lying position to an upright position. Your heart rhythm and blood pressure will be monitored during the test. How is this treated? This condition may be treated by:  Changing your diet. This may involve eating more salt (sodium) or drinking more water.  Taking medicines to raise your blood pressure.  Changing the dosage of certain medicines you are taking that might be lowering your blood pressure.  Wearing compression stockings. These stockings help to prevent blood clots and reduce swelling in your legs. In some cases, you may need to go to the hospital for:  Fluid replacement. This means you will receive fluids through an IV.  Blood replacement. This means you will receive donated blood through an IV (transfusion).  Treating an infection or heart problems, if this applies.  Monitoring. You may need to be monitored while medicines that you are taking wear off. Follow these instructions at home: Eating and drinking   Drink enough fluid to keep your urine pale yellow.  Eat a healthy diet, and follow instructions from your health care provider about eating or drinking restrictions. A healthy diet includes: ? Fresh fruits and vegetables. ? Whole grains. ? Lean meats. ? Low-fat dairy products.  Eat extra salt only as directed. Do not add extra salt to your diet unless your health care provider told you to do that.  Eat frequent, small meals.  Avoid standing up suddenly after eating. Medicines  Take over-the-counter and prescription medicines only as told by  your health care provider. ? Follow instructions from your health care provider about changing the dosage of your current medicines, if this applies. ? Do not stop or adjust any of your medicines on your own. General instructions   Wear compression stockings as told by your health care provider.  Get up slowly from lying down or sitting positions. This gives your blood pressure a chance to adjust.  Avoid hot showers and excessive heat as directed by your health care provider.  Return to your normal activities as told by your health care provider. Ask your health care provider what activities are safe for you.  Do not use any products that contain nicotine or tobacco, such as cigarettes, e-cigarettes, and chewing tobacco. If you need help quitting, ask your health care provider.  Keep all follow-up visits as told by your health care provider. This is important. Contact a health care provider if you:  Vomit.  Have diarrhea.  Have a fever for more than 2-3 days.  Feel more thirsty than usual.  Feel weak and tired. Get help right away if you:  Have chest pain.  Have a fast or irregular heartbeat.  Develop numbness in any part of your body.  Cannot move your arms or your legs.  Have trouble speaking.  Become sweaty or feel light-headed.  Faint.  Feel short of  breath.  Have trouble staying awake.  Feel confused. Summary  Orthostatic hypotension is a sudden drop in blood pressure that happens when you quickly change positions.  Orthostatic hypotension is usually not a serious problem.  It is diagnosed by having your blood pressure taken lying down, sitting, and then standing.  It may be treated by changing your diet or adjusting your medicines. This information is not intended to replace advice given to you by your health care provider. Make sure you discuss any questions you have with your health care provider. Document Revised: 06/02/2018 Document Reviewed:  06/02/2018 Elsevier Patient Education  [place].

## 2020-10-28 ENCOUNTER — Other Ambulatory Visit: Payer: Self-pay | Admitting: Cardiovascular Disease

## 2020-10-28 NOTE — Telephone Encounter (Signed)
Please advise if ok to refill Magnesium 400 mg bid.

## 2020-10-29 ENCOUNTER — Other Ambulatory Visit: Payer: Self-pay

## 2020-10-29 ENCOUNTER — Ambulatory Visit (INDEPENDENT_AMBULATORY_CARE_PROVIDER_SITE_OTHER): Payer: Medicare Other

## 2020-10-29 ENCOUNTER — Other Ambulatory Visit: Payer: Self-pay | Admitting: Family

## 2020-10-29 DIAGNOSIS — I4891 Unspecified atrial fibrillation: Secondary | ICD-10-CM

## 2020-10-30 ENCOUNTER — Other Ambulatory Visit: Payer: Self-pay | Admitting: Internal Medicine

## 2020-10-30 LAB — ECHOCARDIOGRAM COMPLETE
Area-P 1/2: 2.46 cm2
P 1/2 time: 258 msec
S' Lateral: 3.7 cm

## 2020-10-31 NOTE — Telephone Encounter (Signed)
Rx request sent to pharmacy.  

## 2020-10-31 NOTE — Telephone Encounter (Signed)
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 [name] sees pt in [place]. Thanks

## 2020-11-07 ENCOUNTER — Other Ambulatory Visit: Payer: Self-pay | Admitting: Urology

## 2020-11-21 NOTE — Progress Notes (Signed)
Cardiology Office Note  Date:  11/25/2020   ID:  Robert Burch, DOB 25-Sep-1933, MRN [id number]  PCP:  [name], MD   Chief Complaint  Patient presents with  . Other    6 month follow up. Meds reviewed verbally with patient.     HPI:  86 yo with history of  Anxiety concerning his health History of sweats paroxysmal atrial fibrillation,  Atypical chest pain,  catheterization in 2007 showing nonobstructive CAD,  Previous CT coronary calcium score in 2010 score of 68, predominately in the circumflex vessel Non smoker  PVCs, on magnesium Presenting for routine followup of his atrial fibrillation, orthostasis , chest pain symptoms.  Seen by our office Oct 04, 2020 Seen by Dr. [name] June 2021   admission to the hospital, fall,  fracture right hip Mar 2021  Tried ABD binder, got constipated, had to stop using it No recent issues with orthostasis BP avg 856 systolic rates in the 31S  Walking daily 1 mile, with hills No shortness of breath, stable  Chronic issue of periodic sweats in his creases of his arms, armpits happens more in the winter.    Rare palpitations, 15-20 sec  Echo 10/2020: reviewed with him Normal EF 55%  Labs reviewed HBA1C 6.1 Total chol 153, LDL 95  He has diltiazem for breakthrough atrial fibrillation, has   Seen in the emergency room August 2019 for atypical chest pain  EKG personally reviewed by myself on todays visit Shows sinus bradycardia rate 55 bpm  no significant ST or T wave changes  Other past medical history reviewed 06/2009, Results reviewed with him Coronary Calcium Score: 68.2 (61.7 CFX, 6.5 LAD).   32nd percentile which is low risk overall.  Event monitor in April 2017 showing PVCs Stress test July 2016 showing no ischemia Echocardiogram July 2016 showing normal LV function  Previously had significant GI workup for discomfort, most of which has been negative. This included an EGD, abdominal ultrasound. He  does take Tylenol and gabapentin.   PMH:   has a past medical history of Atypical chest pain, Cancer ([place]), Chronic anticoagulation, Chronic anxiety, Diverticulosis, Dysrhythmia, GERD (gastroesophageal reflux disease), Hypertension, IBS (irritable bowel syndrome), Nonischemic cardiomyopathy ([place]), Paralyzed vocal cords, Paroxysmal atrial fibrillation ([place]), and Proteinuria.  PSH:    Past Surgical History:  Procedure Laterality Date  . CARDIAC CATHETERIZATION    . cataract surgery Bilateral   . HIP ARTHROPLASTY Right 03/17/2020   Procedure: ARTHROPLASTY BIPOLAR HIP (HEMIARTHROPLASTY);  Surgeon: [name], MD;  Location: [facility] ORS;  Service: Orthopedics;  Laterality: Right;  . MASS BIOPSY  2013    Current Outpatient Medications  Medication Sig Dispense Refill  . acidophilus (RISAQUAD) CAPS capsule Take 1 capsule by mouth daily.    . bisacodyl (DULCOLAX) 5 MG EC tablet Take 2 tablets (10 mg) once around 10 AM tomorrow    . Cholecalciferol (VITAMIN D3) 400 UNITS CAPS Take 800 Units by mouth daily.     . clotrimazole-betamethasone (LOTRISONE) cream Apply topically 2 (two) times daily.    . Cobalamine Combinations (VITAMIN B12-FOLIC ACID) 970-263 MCG TABS Take 1 tablet by mouth daily.    . Coenzyme Q10 50 MG CAPS Take 50 mg by mouth 2 (two) times daily.     . Cranberry 500 MG CAPS Take 500 mg by mouth daily.    . diclofenac sodium (VOLTAREN) 1 % GEL Apply 2-4 g topically every 6 (six) hours as needed.     . diltiazem (CARDIZEM) 30 MG tablet TAKE  1 TABLET (30 MG) BY MOUTH ONCE DAILY AT BEDTIME 90 tablet 3  . docusate sodium (COLACE) 100 MG capsule Take 1 capsule (100 mg total) by mouth 2 (two) times daily. 10 capsule 0  . finasteride (PROSCAR) 5 MG tablet Take 1 tablet (5 mg total) by mouth daily. 30 tablet 11  . Flaxseed, Linseed, (FLAX SEED OIL) 1000 MG CAPS Take 1,000 mg by mouth daily.     . flecainide (TAMBOCOR) 50 MG tablet Take 2 tablets (100 mg) by mouth twice daily as needed for  recurrent atrial fibrillation 12 tablet 3  . fluticasone (FLONASE) 50 MCG/ACT nasal spray Place into the nose.    . Ginger 500 MG CAPS Take 500 mg by mouth 2 (two) times daily.    . hydrocortisone (ANUSOL-HC) 25 MG suppository Place 25 mg rectally daily as needed for hemorrhoids or anal itching.    . magnesium oxide (MAG-OX) 400 MG tablet TAKE 1 TABLET BY MOUTH TWICE A DAY 180 tablet 0  . mirabegron ER (MYRBETRIQ) 25 MG TB24 tablet Take 1 tablet (25 mg total) by mouth at bedtime. 30 tablet 11  . Multiple Vitamin (MULTIVITAMIN) tablet Take 1 tablet by mouth daily.      . nitroGLYCERIN (NITROSTAT) 0.4 MG SL tablet PLACE 1 TABLET (0.4 MG TOTAL) UNDER THE TONGUE EVERY 5 (FIVE) MINUTES AS NEEDED FOR CHEST PAIN. 90 tablet 0  . Omega-3 Fatty Acids (FISH OIL) 1000 MG CAPS Take 1,000 mg by mouth daily.     Marland Kitchen oxyCODONE (OXY IR/ROXICODONE) 5 MG immediate release tablet Take 0.5-1 tablets (2.5-5 mg total) by mouth every 4 (four) hours as needed for moderate pain (pain score 4-6). 30 tablet 0  . pantoprazole (PROTONIX) 20 MG tablet Take 20 mg by mouth daily.     . polyethylene glycol (MIRALAX / GLYCOLAX) packet Take 17 g by mouth daily as needed for mild constipation or moderate constipation.    . predniSONE (DELTASONE) 10 MG tablet Take 10 mg by mouth daily.    . saw palmetto 160 MG capsule Take 160 mg by mouth 2 (two) times daily.    . sucralfate (CARAFATE) 1 g tablet Take by mouth.    . tamsulosin (FLOMAX) 0.4 MG CAPS capsule TAKE 1 CAPSULE (0.4 MG TOTAL) BY MOUTH 2 (TWO) TIMES DAILY. 180 capsule 0  . traMADol (ULTRAM) 50 MG tablet Take 1 tablet (50 mg total) by mouth every 6 (six) hours as needed for moderate pain. 30 tablet 0  . warfarin (COUMADIN) 1 MG tablet Take 1 tablet (1 mg total) by mouth daily for 1 day. Take total 3.5 mg daily on 03/19/2020 and 03/20/2020. 2 tablet 0  . warfarin (COUMADIN) 2.5 MG tablet Take 1 tablet (2.5 mg total) by mouth every evening. Take 3.5 mg daily total dose on 03/19/2020  and 03/20/2020. Start taking 2.5 mg daily from 03/21/2020.     No current facility-administered medications for this visit.     Allergies:   Ciprofloxacin, Lanoxin [digoxin], Lopressor [metoprolol tartrate], Penicillins, Sulfonamide derivatives, Verapamil, and Sulfa antibiotics   Social History:  The patient  reports that he has never smoked. He has never used smokeless tobacco. He reports that he does not drink alcohol and does not use drugs.   Family History:   family history is not on file.    Review of Systems: Review of Systems  Constitutional: Negative.        Sweats  HENT: Negative.   Respiratory: Negative.   Cardiovascular: Negative.  Gastrointestinal: Negative.   Musculoskeletal: Negative.        Leg pain  Neurological: Negative.   Psychiatric/Behavioral: Negative.   All other systems reviewed and are negative.   PHYSICAL EXAM: VS:  BP (!) 150/80 (BP Location: Left Arm, Patient Position: Sitting, Cuff Size: Normal)   Pulse (!) 55   Ht 5\' 9"  (1.753 m)   Wt 195 lb (88.5 kg)   SpO2 98%   BMI 28.80 kg/m  , BMI Body mass index is 28.8 kg/m.  Constitutional:  oriented to person, place, and time. No distress.  HENT:  Head: Grossly normal Eyes:  no discharge. No scleral icterus.  Neck: No JVD, no carotid bruits  Cardiovascular: Regular rate and rhythm, no murmurs appreciated Pulmonary/Chest: Clear to auscultation bilaterally, no wheezes or rails Abdominal: Soft.  no distension.  no tenderness.  Musculoskeletal: Normal range of motion Neurological:  normal muscle tone. Coordination normal. No atrophy Skin: Skin warm and dry Psychiatric: normal affect, pleasant   Recent Labs: 03/17/2020: ALT 14 03/23/2020: BUN 13; Creatinine, Ser 0.97; Hemoglobin 11.1; Platelets 290; Potassium 4.9; Sodium 135    Lipid Panel No results found for: CHOL, HDL, LDLCALC, TRIG    Wt Readings from Last 3 Encounters:  11/25/20 195 lb (88.5 kg)  10/04/20 189 lb (85.7 kg)  05/23/20  190 lb 8 oz (86.4 kg)     ASSESSMENT AND PLAN:  Paroxysmal atrial fibrillation (HCC) -  On meds as needed, Denies atrial fib, on warfarin  Essential hypertension  Denies orthostasis symptoms BP 219 systolic  Chest heaviness - Previous stress test early 2019.   Atypical, none recently  Gastroesophageal reflux disease without esophagitis Symptoms better on PPI, Carafate, H2 blocker Stable sx  Anxiety  continue walking Stable  Hyperlipidemia Not on a statin total chol runs 150 Recommend continued exercise program, diet modification yes he said he might be back   Total encounter time more than 25 minutes  Greater than 50% was spent in counseling and coordination of care with the patient   Orders Placed This Encounter  Procedures  . EKG 12-Lead     Signed, Esmond Plants, M.D., Ph.D. 11/25/2020  [place], [place]

## 2020-11-25 ENCOUNTER — Encounter: Payer: Self-pay | Admitting: Cardiovascular Disease

## 2020-11-25 ENCOUNTER — Ambulatory Visit (INDEPENDENT_AMBULATORY_CARE_PROVIDER_SITE_OTHER): Payer: Medicare Other | Admitting: Cardiovascular Disease

## 2020-11-25 ENCOUNTER — Other Ambulatory Visit: Payer: Self-pay

## 2020-11-25 VITALS — BP 150/80 | HR 55 | Ht 69.0 in | Wt 195.0 lb

## 2020-11-25 DIAGNOSIS — I493 Ventricular premature depolarization: Secondary | ICD-10-CM

## 2020-11-25 DIAGNOSIS — I10 Essential (primary) hypertension: Secondary | ICD-10-CM

## 2020-11-25 DIAGNOSIS — I951 Orthostatic hypotension: Secondary | ICD-10-CM

## 2020-11-25 DIAGNOSIS — I480 Paroxysmal atrial fibrillation: Secondary | ICD-10-CM

## 2020-11-25 DIAGNOSIS — I491 Atrial premature depolarization: Secondary | ICD-10-CM

## 2020-11-25 DIAGNOSIS — I428 Other cardiomyopathies: Secondary | ICD-10-CM

## 2020-11-25 NOTE — Patient Instructions (Signed)
Medication Instructions:  No changes  If you need a refill on your cardiac medications before your next appointment, please call your pharmacy.    Lab work: No new labs needed   If you have labs (blood work) drawn today and your tests are completely normal, you will receive your results only by: . MyChart Message (if you have MyChart) OR . A paper copy in the mail If you have any lab test that is abnormal or we need to change your treatment, we will call you to review the results.   Testing/Procedures: No new testing needed   Follow-Up: At [facility], you and your health needs are our priority.  As part of our continuing mission to provide you with exceptional heart care, we have created designated Provider Care Teams.  These Care Teams include your primary Cardiologist (physician) and Advanced Practice Providers (APPs -  Physician Assistants and Nurse Practitioners) who all work together to provide you with the care you need, when you need it.  . You will need a follow up appointment in 12 months  . Providers on your designated Care Team:   . Christopher Berge, NP . Ryan Dunn, PA-C . Jacquelyn Visser, PA-C  Any Other Special Instructions Will Be Listed Below (If Applicable).  COVID-19 Vaccine Information can be found at: [email or web address] For questions related to vaccine distribution or appointments, please email [email or web address] or call [phone].     

## 2020-12-21 ENCOUNTER — Other Ambulatory Visit: Payer: Self-pay | Admitting: Urology

## 2020-12-26 ENCOUNTER — Ambulatory Visit: Payer: Medicare Other | Admitting: Urology

## 2020-12-26 ENCOUNTER — Encounter: Payer: Self-pay | Admitting: Urology

## 2020-12-26 ENCOUNTER — Ambulatory Visit: Payer: Self-pay | Admitting: Urology

## 2020-12-26 ENCOUNTER — Other Ambulatory Visit: Payer: Self-pay

## 2020-12-26 VITALS — BP 149/80 | HR 68 | Ht 69.0 in | Wt 195.0 lb

## 2020-12-26 DIAGNOSIS — N3281 Overactive bladder: Secondary | ICD-10-CM

## 2020-12-26 DIAGNOSIS — N401 Enlarged prostate with lower urinary tract symptoms: Secondary | ICD-10-CM | POA: Diagnosis not present

## 2020-12-26 DIAGNOSIS — N138 Other obstructive and reflux uropathy: Secondary | ICD-10-CM | POA: Diagnosis not present

## 2020-12-26 LAB — BLADDER SCAN AMB NON-IMAGING

## 2020-12-26 MED ORDER — TAMSULOSIN HCL 0.4 MG PO CAPS: ORAL_CAPSULE | ORAL | 3 refills | Status: DC

## 2020-12-26 MED ORDER — FINASTERIDE 5 MG PO TABS: 5.0000 mg | ORAL_TABLET | Freq: Every day | ORAL | 11 refills | Status: DC

## 2020-12-26 NOTE — Patient Instructions (Signed)

## 2020-12-26 NOTE — Progress Notes (Signed)
   12/26/2020 6:24 PM   Clelia Croft Kerby 1933/03/12 [id number]  Reason for visit: Follow up BPH/OAB  HPI: I saw Mr. Hannen in urology clinic for follow-up of his chronic urinary symptoms.  Briefly is an 87 year old comorbid male with atrial fibrillation on Coumadin and long history of urinary symptoms.  He is on maximal medical therapy with Flomax, finasteride, as well as Myrbetriq 25 mg daily.  A CT in May 2019 showed a 125 g prostate.  PVRs have always been normal, and is normal at 12 mL today.  In March she had a hip fracture and was hospitalized.  He continues to drink coffee in the morning, and tea during the day as well as right before bed.  IPSS score today is 10.  His primary complaint is the urgency, as well as nocturia.  We reviewed behavioral strategies at length.  With his mixed BPH/OAB symptoms, we would certainly need to pursue urodynamics prior to any surgical outlet procedures.  He would like to trial off the Myrbetriq to see how his urinary symptoms go since this is expensive.  I think this is very reasonable.  I also discussed behavioral strategies at length including minimizing fluids before bed, and cutting back on coffee and tea in the diet, especially in the evenings.  Flomax and finasteride refilled Trial off Myrbetriq, but okay to refill if he feels his symptoms are worse off of it, or even try 50 mg RTC 1 year PVR symptom check   Sondra Come, MD  [facility] [address] [place], [place] [address] [phone]

## 2020-12-27 ENCOUNTER — Ambulatory Visit: Payer: Self-pay | Admitting: Urology

## 2021-01-29 ENCOUNTER — Other Ambulatory Visit: Payer: Self-pay | Admitting: Cardiovascular Disease

## 2021-01-31 ENCOUNTER — Telehealth: Payer: Self-pay | Admitting: Cardiovascular Disease

## 2021-01-31 NOTE — Telephone Encounter (Signed)
   [facility] Pre-operative Risk Assessment    HEARTCARE STAFF: - Please ensure there is not already an duplicate clearance open for this procedure. - Under Visit Info/Reason for Call, type in Other and utilize the format Clearance MM/DD/YY or Clearance TBD. Do not use dashes or single digits. - If request is for dental extraction, please clarify the # of teeth to be extracted.  Request for surgical clearance:  1. What type of surgery is being performed? Dental extraction (not noted how many, office closed when called)  2. When is this surgery scheduled? TBD  3. What type of clearance is required (medical clearance vs. Pharmacy clearance to hold med vs. Both)? pharm  4. Are there any medications that need to be held prior to surgery and how long? Advise on coumadin  5. Practice name and name of physician performing surgery? [facility]   6. What is the office phone number? [phone]   7.   What is the office fax number? [phone]  8.   Anesthesia type (None, local, MAC, general) ? Not noted  Dr. Lovie Macadamia was the first recipient of this clearance and recommended to stop coumadin 5 days prior, restart 1 day after. Also, recommended starting lovenox shots during that time   Robert Burch 01/31/2021, 2:34 PM  _________________________________________________________________   (provider comments below)

## 2021-01-31 NOTE — Telephone Encounter (Signed)
   Primary Cardiologist: Ida Rogue, MD  Chart reviewed as part of pre-operative protocol coverage. Need more information on how many teeth are being extracted and under what type of anesthesia. If simple extraction would not hold anticoagulation. Per preliminary chart review, to include with clearance, do not see indication for SBE ppx.  Looks like office was closed when they tried to call so would call on Monday. I am not sure what the last statement in the clearance means, regarding the first recipient - unfamiliar with that name.  Charlie Pitter, PA-C 01/31/2021, 4:25 PM

## 2021-02-03 NOTE — Telephone Encounter (Signed)
Spoke with dentist office and they state that they received a call last Thursday on this pt giving them the recommendation that the patient hold his coumadin 5 days prior to the procedure and restart 1 day after, but they need this in writing. It appears that this recommendation came from patients PCP, Dr Lovie Macadamia. Patient is having one tooth extracted, but it may not be a simple extraction as it is a wisdom tooth so it is scheduled as a surgical extraction. Anesthesia used will be lidocaine and septocaine.

## 2021-02-03 NOTE — Telephone Encounter (Signed)
Dr Yetta Numbers office has already called the dentist office and provided verbal recommendations but they need something in writing. I informed the patient that he may need to call Dr Yetta Numbers office and ask that they fax over written recommendations to the dental office.

## 2021-02-03 NOTE — Telephone Encounter (Signed)
Please find out where patient gets warfarin checked.  If warfarin is managed by primary care, the recommendations of how to manage warfarin around his dental procedure would have to come from his PCP.  Richardson Dopp, PA-C    02/03/2021 4:40 PM

## 2021-02-03 NOTE — Telephone Encounter (Signed)
Note sent to dentist.  Advised dentist to request recommendations regarding warfarin from PCP, who manages his anticoagulation.  This note will be removed from the preop pool.  Richardson Dopp, PA-C    02/03/2021 5:02 PM

## 2021-02-03 NOTE — Telephone Encounter (Signed)
   The patient's warfarin is managed by his primary care provider, [name], MD.  Please contact his PCP's office for written recommendations regarding holding his warfarin for his dental procedure.  Richardson Dopp, PA-C    02/03/2021 5:00 PM

## 2021-02-03 NOTE — Telephone Encounter (Signed)
Spoke with patient and he states that his PCP, Dr Lovie Macadamia manages his warfarin.

## 2021-04-24 ENCOUNTER — Other Ambulatory Visit: Payer: Self-pay

## 2021-04-24 ENCOUNTER — Emergency Department
Admission: EM | Admit: 2021-04-24 | Discharge: 2021-04-24 | Disposition: A | Discharge status: Home / Self Care | Payer: Medicare Other | Attending: Emergency Medicine | Admitting: Emergency Medicine

## 2021-04-24 DIAGNOSIS — Z9861 Coronary angioplasty status: Secondary | ICD-10-CM | POA: Diagnosis not present

## 2021-04-24 DIAGNOSIS — Z96641 Presence of right artificial hip joint: Secondary | ICD-10-CM | POA: Insufficient documentation

## 2021-04-24 DIAGNOSIS — W540XXA Bitten by dog, initial encounter: Secondary | ICD-10-CM | POA: Diagnosis not present

## 2021-04-24 DIAGNOSIS — Z859 Personal history of malignant neoplasm, unspecified: Secondary | ICD-10-CM | POA: Diagnosis not present

## 2021-04-24 DIAGNOSIS — Z79899 Other long term (current) drug therapy: Secondary | ICD-10-CM | POA: Insufficient documentation

## 2021-04-24 DIAGNOSIS — I10 Essential (primary) hypertension: Secondary | ICD-10-CM | POA: Insufficient documentation

## 2021-04-24 DIAGNOSIS — Z23 Encounter for immunization: Secondary | ICD-10-CM | POA: Insufficient documentation

## 2021-04-24 DIAGNOSIS — S51851A Open bite of right forearm, initial encounter: Secondary | ICD-10-CM | POA: Insufficient documentation

## 2021-04-24 MED ORDER — AMOXICILLIN-POT CLAVULANATE 875-125 MG PO TABS: 1.0000 | ORAL_TABLET | Freq: Two times a day (BID) | ORAL | 0 refills | Status: AC

## 2021-04-24 MED ORDER — TETANUS-DIPHTH-ACELL PERTUSSIS 5-2.5-18.5 LF-MCG/0.5 IM SUSY
0.5000 mL | PREFILLED_SYRINGE | Freq: Once | INTRAMUSCULAR | Status: AC
  Administered 2021-04-24: 0.5 mL via INTRAMUSCULAR
  Filled 2021-04-24: qty 0.5

## 2021-04-24 NOTE — ED Provider Notes (Signed)
[facility]-EMERGENCY DEPARTMENT  ____________________________________________  Time seen: Approximately 8:15 PM  I have reviewed the triage vital signs and the nursing notes.   HISTORY  Chief Complaint Animal Bite   Historian Patient     HPI Robert Burch is a 87 y.o. male presents to the emergency department with 2 superficial dog bite wounds along the volar aspect of the right forearm.  Patient was bitten by his neighbors dog who is available to be quarantined for signs and symptoms of rabies and was up-to-date on rabies vaccination series.  Patient denies numbness or tingling of the right upper extremity.  He cannot recall his last tetanus shot.   Past Medical History:  Diagnosis Date  . Atypical chest pain    a. Nonobstructive, very minor irregs by cath 2007;  b. 06/2009 Ex MV: small area of apical lateral ischemia;  c. 06/2009 Cath: essentially normal;  d. 04/2014 Lexiscan MV: EF 59%, no ischemia/infarct->low risk.  . Cancer ([place])   . Chronic anticoagulation    a. Chronic Coumadin  . Chronic anxiety   . Diverticulosis   . Dysrhythmia    atrial fib  . GERD (gastroesophageal reflux disease)   . Hypertension   . IBS (irritable bowel syndrome)   . Nonischemic cardiomyopathy ([place])    a. EF 45% by echo 2010;  b. 05/2012 Echo: EF 55-65%, no rwma, Gr 1 DD, mild AI/MR, mildly dil LA.  . Paralyzed vocal cords   . Paroxysmal atrial fibrillation ([place])    a. Treated with flecainide  . Proteinuria    History of     Immunizations up to date:  Yes.     Past Medical History:  Diagnosis Date  . Atypical chest pain    a. Nonobstructive, very minor irregs by cath 2007;  b. 06/2009 Ex MV: small area of apical lateral ischemia;  c. 06/2009 Cath: essentially normal;  d. 04/2014 Lexiscan MV: EF 59%, no ischemia/infarct->low risk.  . Cancer ([place])   . Chronic anticoagulation    a. Chronic Coumadin  . Chronic anxiety   . Diverticulosis   . Dysrhythmia    atrial fib  . GERD  (gastroesophageal reflux disease)   . Hypertension   . IBS (irritable bowel syndrome)   . Nonischemic cardiomyopathy ([place])    a. EF 45% by echo 2010;  b. 05/2012 Echo: EF 55-65%, no rwma, Gr 1 DD, mild AI/MR, mildly dil LA.  . Paralyzed vocal cords   . Paroxysmal atrial fibrillation ([place])    a. Treated with flecainide  . Proteinuria    History of    Patient Active Problem List   Diagnosis Date Noted  . Closed right femoral fracture ([place]) 03/16/2020  . PAC (premature atrial contraction) 07/06/2019  . PVC (premature ventricular contraction) 07/06/2019  . Anemia 10/12/2018  . Sinus bradycardia 09/23/2018  . Coronary artery calcification seen on CAT scan 11/06/2017  . Diverticulum of bladder 11/26/2015  . Gross hematuria 10/29/2015  . Hyperlipidemia 04/22/2015  . Cervical radiculitis 05/22/2014  . DDD (degenerative disc disease), lumbar 05/22/2014  . Lumbar stenosis with neurogenic claudication 05/22/2014  . Paroxysmal atrial fibrillation ([facility])   . Atypical chest pain   . GERD (gastroesophageal reflux disease)   . Elevated prostate specific antigen (PSA) 12/06/2013  . Anxiety 10/17/2013  . Benign localized hyperplasia of prostate with urinary obstruction 08/31/2013  . Chronic prostatitis 08/31/2013  . ED (erectile dysfunction) of organic origin 08/31/2013  . Incomplete emptying of bladder 08/31/2013  . Syncope and collapse 12/07/2011  .  Snoring 07/02/2011  . Abnormal respiratory rate 07/02/2011  . Essential hypertension 07/22/2009    Past Surgical History:  Procedure Laterality Date  . CARDIAC CATHETERIZATION    . cataract surgery Bilateral   . HIP ARTHROPLASTY Right 03/17/2020   Procedure: ARTHROPLASTY BIPOLAR HIP (HEMIARTHROPLASTY);  Surgeon: [name], MD;  Location: [facility] ORS;  Service: Orthopedics;  Laterality: Right;  . MASS BIOPSY  2013    Prior to Admission medications   Medication Sig Start Date End Date Taking? Authorizing Provider  amoxicillin-clavulanate  (AUGMENTIN) 875-125 MG tablet Take 1 tablet by mouth 2 (two) times daily for 10 days. 04/24/21 05/04/21 Yes Vallarie Mare M, PA-C  acidophilus (RISAQUAD) CAPS capsule Take 1 capsule by mouth daily.    [provider]  bisacodyl (DULCOLAX) 5 MG EC tablet Take 2 tablets (10 mg) once around 10 AM tomorrow 06/26/20   [provider]  Cholecalciferol (VITAMIN D3) 400 UNITS CAPS Take 800 Units by mouth daily.     [provider]  clotrimazole-betamethasone (LOTRISONE) cream Apply topically 2 (two) times daily. 04/24/20   [provider]  Cobalamine Combinations (VITAMIN B12-FOLIC ACID) XX123456 MCG TABS Take 1 tablet by mouth daily.    [provider]  Coenzyme Q10 50 MG CAPS Take 50 mg by mouth 2 (two) times daily.     [provider]  Cranberry 500 MG CAPS Take 500 mg by mouth daily.    [provider]  diclofenac sodium (VOLTAREN) 1 % GEL Apply 2-4 g topically every 6 (six) hours as needed.  09/23/17   [provider]  diltiazem (CARDIZEM) 30 MG tablet TAKE 1 TABLET (30 MG) BY MOUTH ONCE DAILY AT BEDTIME 10/31/20   Deboraha Sprang, MD  docusate sodium (COLACE) 100 MG capsule Take 1 capsule (100 mg total) by mouth 2 (two) times daily. 03/19/20   Lavina Hamman, MD  finasteride (PROSCAR) 5 MG tablet Take 1 tablet (5 mg total) by mouth daily. 12/26/20   Billey Co, MD  Flaxseed, Linseed, (FLAX SEED OIL) 1000 MG CAPS Take 1,000 mg by mouth daily.     [provider]  flecainide (TAMBOCOR) 50 MG tablet Take 2 tablets (100 mg) by mouth twice daily as needed for recurrent atrial fibrillation 07/06/19   Deboraha Sprang, MD  fluticasone [facility]) 50 MCG/ACT nasal spray Place into the nose. 07/26/20   [provider]  Ginger 500 MG CAPS Take 500 mg by mouth 2 (two) times daily.    [provider]  hydrocortisone (ANUSOL-HC) 25 MG suppository Place 25 mg rectally daily as needed for hemorrhoids or anal itching.    [provider]  magnesium oxide (MAG-OX) 400 MG tablet TAKE 1 TABLET BY MOUTH TWICE A DAY 01/29/21   [name], Kathlene November, MD  mirabegron ER (MYRBETRIQ) 25 MG TB24 tablet Take 1 tablet (25 mg total) by mouth at bedtime. 11/07/20   Billey Co, MD  Multiple Vitamin (MULTIVITAMIN) tablet Take 1 tablet by mouth daily.      [provider]  nitroGLYCERIN (NITROSTAT) 0.4 MG SL tablet PLACE 1 TABLET (0.4 MG TOTAL) UNDER THE TONGUE EVERY 5 (FIVE) MINUTES AS NEEDED FOR CHEST PAIN. 09/30/18   Deboraha Sprang, MD  Omega-3 Fatty Acids (FISH OIL) 1000 MG CAPS Take 1,000 mg by mouth daily.     [provider]  oxyCODONE (OXY IR/ROXICODONE) 5 MG immediate release tablet Take 0.5-1 tablets (2.5-5 mg total) by mouth every 4 (four) hours as needed  for moderate pain (pain score 4-6). 03/19/20   Reche Dixon, PA-C  pantoprazole (PROTONIX) 20 MG tablet Take 20 mg by mouth daily.     [provider]  polyethylene glycol (MIRALAX / GLYCOLAX) packet Take 17 g by mouth daily as needed for mild constipation or moderate constipation.    [provider]  predniSONE (DELTASONE) 10 MG tablet Take 10 mg by mouth daily. 07/09/20   [provider]  saw palmetto 160 MG capsule Take 160 mg by mouth 2 (two) times daily.    [provider]  sucralfate (CARAFATE) 1 g tablet Take by mouth. 08/13/20   [provider]  tamsulosin (FLOMAX) 0.4 MG CAPS capsule TAKE 1 CAPSULE (0.4 MG TOTAL) BY MOUTH 2 (TWO) TIMES DAILY. 12/26/20   Billey Co, MD  traMADol (ULTRAM) 50 MG tablet Take 1 tablet (50 mg total) by mouth every 6 (six) hours as needed for moderate pain. 03/19/20   Reche Dixon, PA-C  warfarin (COUMADIN) 1 MG tablet Take 1 tablet (1 mg total) by mouth daily for 1 day. Take total 3.5 mg daily on 03/19/2020 and 03/20/2020. 03/19/20 11/25/20  Lavina Hamman, MD  warfarin (COUMADIN) 2.5 MG tablet Take 1 tablet (2.5 mg total) by mouth every evening. Take 3.5 mg daily total dose on  03/19/2020 and 03/20/2020. Start taking 2.5 mg daily from 03/21/2020. 03/19/20   Lavina Hamman, MD    Allergies Ciprofloxacin, Lanoxin [digoxin], Lopressor [metoprolol tartrate], Penicillins, Sulfonamide derivatives, Verapamil, and Sulfa antibiotics  Family History  Problem Relation Age of Onset  . Heart attack Neg Hx     Social History Social History   Tobacco Use  . Smoking status: Never Smoker  . Smokeless tobacco: Never Used  Substance Use Topics  . Alcohol use: No    Alcohol/week: 2.0 standard drinks    Types: 1 Glasses of wine, 1 Standard drinks or equivalent per week    Comment: occassionally  . Drug use: No     Review of Systems  Constitutional: No fever/chills Eyes:  No discharge ENT: No upper respiratory complaints. Respiratory: no cough. No SOB/ use of accessory muscles to breath Gastrointestinal:   No nausea, no vomiting.  No diarrhea.  No constipation. Musculoskeletal: Patient has right forearm pain.  Skin: Patient has two dog bite wounds.     ____________________________________________   PHYSICAL EXAM:  VITAL SIGNS: ED Triage Vitals [04/24/21 1922]  Enc Vitals Group     BP (!) 190/86     Pulse Rate 65     Resp 18     Temp (!) 97.5 F (36.4 C)     Temp Source Oral     SpO2 100 %     Weight      Height      Head Circumference      Peak Flow      Pain Score 5     Pain Loc      Pain Edu?      Excl. in Early?      Constitutional: Alert and oriented. Well appearing and in no acute distress. Eyes: Conjunctivae are normal. PERRL. EOMI. Head: Atraumatic. ENT:      Nose: No congestion/rhinnorhea.      Mouth/Throat: Mucous membranes are moist.  Neck: No stridor.  No cervical spine tenderness to palpation. Cardiovascular: Normal rate, regular rhythm. Normal S1 and S2.  Good peripheral circulation. Respiratory: Normal respiratory effort without tachypnea or retractions. Lungs CTAB. Good air entry to the bases with  no decreased or absent breath  sounds Gastrointestinal: Bowel sounds x 4 quadrants. Soft and nontender to palpation. No guarding or rigidity. No distention. Musculoskeletal: Full range of motion to all extremities. No obvious deformities noted Neurologic:  Normal for age. No gross focal neurologic deficits are appreciated.  Skin: Patient has 2 superficial dog bite wounds along the volar aspect of the right forearm. Psychiatric: Mood and affect are normal for age. Speech and behavior are normal.   ____________________________________________   LABS (all labs ordered are listed, but only abnormal results are displayed)  Labs Reviewed - No data to display ____________________________________________  EKG   ____________________________________________  RADIOLOGY   No results found.  ____________________________________________    PROCEDURES  Procedure(s) performed:     Marland KitchenMarland KitchenLaceration Repair  Date/Time: 04/24/2021 8:17 PM Performed by: Lannie Fields, PA-C Authorized by: Lannie Fields, PA-C   Consent:    Consent obtained:  Verbal   Consent given by:  Patient   Risks discussed:  Infection and pain Universal protocol:    Procedure explained and questions answered to patient or proxy's satisfaction: yes     Patient identity confirmed:  Verbally with patient Anesthesia:    Anesthesia method:  None Laceration details:    Location:  Shoulder/arm   Shoulder/arm location:  R lower arm   Length (cm):  2   Depth (mm):  2 Exploration:    Limited defect created (wound extended): no     Contaminated: no   Treatment:    Area cleansed with:  Povidone-iodine   Amount of cleaning:  Standard   Irrigation solution:  Sterile saline   Visualized foreign bodies/material removed: no   Skin repair:    Repair method:  Steri-Strips Approximation:    Approximation:  Close Repair type:    Repair type:  Simple Post-procedure details:    Dressing:  Open (no dressing)   Procedure completion:  Tolerated well, no  immediate complications       Medications  Tdap (BOOSTRIX) injection 0.5 mL (0.5 mLs Intramuscular Given 04/24/21 1958)     ____________________________________________   INITIAL IMPRESSION / ASSESSMENT AND PLAN / ED COURSE  Pertinent labs & imaging results that were available during my care of the patient were reviewed by me and considered in my medical decision making (see chart for details).     Assessment and plan Dog bite wound 87 year old male presents to the emergency department after he was bitten by his neighbor's dog.  Dog is available to be quarantined for signs and symptoms of rabies.  Recommended reporting incident to [facility] police.  Patient's wounds were irrigated copiously in the emergency department with normal saline and Betadine and Steri-Strips were applied.  Explained to patient that there is a high risk of infection when closing dog bite wounds with sutures.  Patient was advised to take Augmentin twice daily for the next 10 days.  Emphasized the high risk of infection with dog bite wounds without compliance to antibiotic.  Patient's tetanus status was updated in the emergency department.  The pros and cons of starting rabies vaccine series was discussed and patient declined rabies vaccine series at this time.      ____________________________________________  FINAL CLINICAL IMPRESSION(S) / ED DIAGNOSES  Final diagnoses:  Dog bite, initial encounter      NEW MEDICATIONS STARTED DURING THIS VISIT:  ED Discharge Orders         Ordered    amoxicillin-clavulanate (AUGMENTIN) 875-125 MG tablet  2 times daily  04/24/21 2013              This chart was dictated using voice recognition software/Dragon. Despite best efforts to proofread, errors can occur which can change the meaning. Any change was purely unintentional.     Lannie Fields, PA-C 04/24/21 2019    Harvest Dark, MD 04/24/21 2330

## 2021-04-24 NOTE — Discharge Instructions (Addendum)
Take Augmentin twice daily for the next 10 days. Steri-Strips should come off on their own in 2 to 3 days. Keep wound clean and dry for the next 24 hours. If you develop any redness or streaking surrounding wound site, please return to the emergency department for reevaluation.

## 2021-04-24 NOTE — ED Triage Notes (Signed)
Pt presents with a dog bite to the right forearm. Pt reports his neighbors dog bit him. The dog is UTD on vaccines. Pt reports last tetanus was approx 1 year ago. Bleeding controlled. Denies other injury.

## 2021-06-05 ENCOUNTER — Telehealth: Payer: Self-pay | Admitting: Internal Medicine

## 2021-06-05 ENCOUNTER — Encounter: Payer: Self-pay | Admitting: Internal Medicine

## 2021-06-05 ENCOUNTER — Other Ambulatory Visit: Payer: Self-pay

## 2021-06-05 ENCOUNTER — Ambulatory Visit: Payer: Medicare Other | Admitting: Internal Medicine

## 2021-06-05 VITALS — BP 176/85 | HR 63 | Ht 69.0 in | Wt 189.0 lb

## 2021-06-05 DIAGNOSIS — I480 Paroxysmal atrial fibrillation: Secondary | ICD-10-CM

## 2021-06-05 DIAGNOSIS — I951 Orthostatic hypotension: Secondary | ICD-10-CM

## 2021-06-05 DIAGNOSIS — I493 Ventricular premature depolarization: Secondary | ICD-10-CM

## 2021-06-05 DIAGNOSIS — I10 Essential (primary) hypertension: Secondary | ICD-10-CM

## 2021-06-05 DIAGNOSIS — I491 Atrial premature depolarization: Secondary | ICD-10-CM

## 2021-06-05 NOTE — Patient Instructions (Signed)
Medication Instructions:  - Your physician recommends that you continue on your current medications as directed. Please refer to the Current Medication list given to you today.  *If you need a refill on your cardiac medications before your next appointment, please call your pharmacy*   Lab Work: - none ordered  If you have labs (blood work) drawn today and your tests are completely normal, you will receive your results only by: [place] (if you have MyChart) OR A paper copy in the mail If you have any lab test that is abnormal or we need to change your treatment, we will call you to review the results.   Testing/Procedures: - none ordered   Follow-Up: At [facility], you and your health needs are our priority.  As part of our continuing mission to provide you with exceptional heart care, we have created designated Provider Care Teams.  These Care Teams include your primary Cardiologist (physician) and Advanced Practice Providers (APPs -  Physician Assistants and Nurse Practitioners) who all work together to provide you with the care you need, when you need it.  We recommend signing up for the patient portal called "MyChart".  Sign up information is provided on this After Visit Summary.  MyChart is used to connect with patients for Virtual Visits (Telemedicine).  Patients are able to view lab/test results, encounter notes, upcoming appointments, etc.  Non-urgent messages can be sent to your provider as well.   To learn more about what you can do with MyChart, go to [email or web address].    Your next appointment:   6 week(s)  - Tuesday 07/08/21 at 8:40 am (needs an updated consent)   The format for your next appointment:   Virtual Visit   Provider:   Virl Axe, MD   Other Instructions  1) Wear your abdominal binder at night 2) Wear thigh sleeves during the day 3) Check your blood pressure once a week x 1 month just before getting out of bed in the morning

## 2021-06-05 NOTE — Progress Notes (Signed)
Patient ID: Robert Burch, male   DOB: December 13, 1933, 87 y.o.   MRN: [id number]      Patient Care Team: Robert Pitch, MD as PCP - General (Family Medicine) Robert Merritts, MD as PCP - Cardiology (Cardiology) Robert Sprang, MD as PCP - Electrophysiology (Cardiology) Robert Sprang, MD as Consulting Physician (Cardiology) Robert Merritts, MD as Consulting Physician (Cardiology)   HPI  Robert Burch is a 87 y.o. male Seen in followup for paroxysmal atrial fibrillation in the setting of nonischemic coronary disease; Managed with flecainide taking regularly, but more recently prn. Previously referred to Dr Robert Burch for consideration of pulm vein isolation; pt declined ablation anticoagulation with warfarin  .  Noted to have bradycardia with heart rates in the 40s.  Not clearly symptomatic.  Developed some dyspnea.  At his request undertook exclusion trial of flecainide as the cause of his dyspnea-- felt some what better, and after telephone call elected to try and use his flecainide prn--with which he had less dyspnea. Interval atrial fibrillation for which he took flecainide 50 mg x 2 with resolution within a few hours.  And the plan has been to use as needed flecainide.  Was started on Myrbetriq.  This is been associated with elevated blood pressure.  The dose was decreased from 50--25 mg; blood pressures have remained modestly elevated.  Previously valsartan was discontinued because of orthostatic hypotension with improvement in symptoms and relatively stable systolic blood pressures  Fell and broke a hip 3/21.  Tripped.  Has had problems with lightheadedness worse in the morning, upon arising..  Takes Flomax in the morning.  And things worsen.  Intermittent brief episodes of tachypalpitations lasting just seconds.  On warfarin without bleeding.  No shortness of breath or chest pain.   He has been fairly well despite experiencing lightheadedness in the  morning upon waking up and will last approx. Last 1-2 hours.  He notes he have had no AFIB to his acknowledgment   Nocturnal dyspnea is doing better; due to head being slightly elevated He states he go to restroom approx 4-5 times at night   Has an at home BP log only containing ranges from him sitting; diastolic ranges are low  He is concerned about Cardizem 30 mg Take 1 Tab PO daily:if dosage should be increased and when is the correct time to take medication.   He Stop using the abdominal binder due to constipation: requesting a different method for compression   Stays active by walking approx. 1 mile daily  The patient denies chest pain, shortness of breath, nocturnal dyspnea, orthopnea or peripheral edema. There have been no palpitations, or syncope.  DATE TEST EF   7/16    Echo    55-60 % Mild LAE  7/16    myoview   35 % No ischemia-gut artifact   11/21 Echo 55-60%     Date Cr K Hgb  5/19 1.0 5.2 13.1`  8/20 0.9 4.4 13.3  4/21 0.97 4.9 11.1  2/22 1.0 4.5 12.2    Records and Results Reviewed notes from Dr. Deidre Ala Burch monitors  Past Medical History:  Diagnosis Date   Atypical chest pain    a. Nonobstructive, very minor irregs by cath 2007;  b. 06/2009 Ex MV: small area of apical lateral ischemia;  c. 06/2009 Cath: essentially normal;  d. 04/2014 Lexiscan MV: EF 59%, no ischemia/infarct->low risk.   Cancer (HCC)    Chronic anticoagulation    a. Chronic Coumadin  Chronic anxiety    Diverticulosis    Dysrhythmia    atrial fib   GERD (gastroesophageal reflux disease)    Hypertension    IBS (irritable bowel syndrome)    Nonischemic cardiomyopathy ([place])    a. EF 45% by echo 2010;  b. 05/2012 Echo: EF 55-65%, no rwma, Gr 1 DD, mild AI/MR, mildly dil LA.   Paralyzed vocal cords    Paroxysmal atrial fibrillation ([place])    a. Treated with flecainide   Proteinuria    History of    Past Surgical History:  Procedure Laterality Date   CARDIAC CATHETERIZATION     cataract  surgery Bilateral    HIP ARTHROPLASTY Right 03/17/2020   Procedure: ARTHROPLASTY BIPOLAR HIP (HEMIARTHROPLASTY);  Surgeon: [name], MD;  Location: [facility] ORS;  Service: Orthopedics;  Laterality: Right;   MASS BIOPSY  2013    Current Outpatient Medications  Medication Sig Dispense Refill   acidophilus (RISAQUAD) CAPS capsule Take 1 capsule by mouth daily.     bisacodyl (DULCOLAX) 5 MG EC tablet Take 2 tablets (10 mg) once around 10 AM tomorrow     Cholecalciferol (VITAMIN D3) 400 UNITS CAPS Take 800 Units by mouth daily.      clotrimazole-betamethasone (LOTRISONE) cream Apply topically 2 (two) times daily.     Cobalamine Combinations (VITAMIN B12-FOLIC ACID) 332-951 MCG TABS Take 1 tablet by mouth daily.     Coenzyme Q10 50 MG CAPS Take 50 mg by mouth 2 (two) times daily.      Cranberry 500 MG CAPS Take 500 mg by mouth daily.     diclofenac sodium (VOLTAREN) 1 % GEL Apply 2-4 g topically every 6 (six) hours as needed.      diltiazem (CARDIZEM) 30 MG tablet TAKE 1 TABLET (30 MG) BY MOUTH ONCE DAILY AT BEDTIME 90 tablet 3   docusate sodium (COLACE) 100 MG capsule Take 1 capsule (100 mg total) by mouth 2 (two) times daily. 10 capsule 0   finasteride (PROSCAR) 5 MG tablet Take 1 tablet (5 mg total) by mouth daily. 30 tablet 11   Flaxseed, Linseed, (FLAX SEED OIL) 1000 MG CAPS Take 1,000 mg by mouth daily.      flecainide (TAMBOCOR) 50 MG tablet Take 2 tablets (100 mg) by mouth twice daily as needed for recurrent atrial fibrillation 12 tablet 3   fluticasone (FLONASE) 50 MCG/ACT nasal spray Place into the nose.     Ginger 500 MG CAPS Take 500 mg by mouth 2 (two) times daily.     hydrocortisone (ANUSOL-HC) 25 MG suppository Place 25 mg rectally daily as needed for hemorrhoids or anal itching.     magnesium oxide (MAG-OX) 400 MG tablet TAKE 1 TABLET BY MOUTH TWICE A DAY 180 tablet 2   mirabegron ER (MYRBETRIQ) 25 MG TB24 tablet Take 1 tablet (25 mg total) by mouth at bedtime. 30 tablet 11    Multiple Vitamin (MULTIVITAMIN) tablet Take 1 tablet by mouth daily.       nitroGLYCERIN (NITROSTAT) 0.4 MG SL tablet PLACE 1 TABLET (0.4 MG TOTAL) UNDER THE TONGUE EVERY 5 (FIVE) MINUTES AS NEEDED FOR CHEST PAIN. 90 tablet 0   Omega-3 Fatty Acids (FISH OIL) 1000 MG CAPS Take 1,000 mg by mouth daily.      oxyCODONE (OXY IR/ROXICODONE) 5 MG immediate release tablet Take 0.5-1 tablets (2.5-5 mg total) by mouth every 4 (four) hours as needed for moderate pain (pain score 4-6). 30 tablet 0   pantoprazole (PROTONIX) 20 MG tablet Take  20 mg by mouth daily.      polyethylene glycol (MIRALAX / GLYCOLAX) packet Take 17 g by mouth daily as needed for mild constipation or moderate constipation.     predniSONE (DELTASONE) 10 MG tablet Take 10 mg by mouth daily.     saw palmetto 160 MG capsule Take 160 mg by mouth 2 (two) times daily.     sucralfate (CARAFATE) 1 g tablet Take by mouth.     tamsulosin (FLOMAX) 0.4 MG CAPS capsule TAKE 1 CAPSULE (0.4 MG TOTAL) BY MOUTH 2 (TWO) TIMES DAILY. 180 capsule 3   traMADol (ULTRAM) 50 MG tablet Take 1 tablet (50 mg total) by mouth every 6 (six) hours as needed for moderate pain. 30 tablet 0   warfarin (COUMADIN) 1 MG tablet Take 1 tablet (1 mg total) by mouth daily for 1 day. Take total 3.5 mg daily on 03/19/2020 and 03/20/2020. 2 tablet 0   warfarin (COUMADIN) 2.5 MG tablet Take 1 tablet (2.5 mg total) by mouth every evening. Take 3.5 mg daily total dose on 03/19/2020 and 03/20/2020. Start taking 2.5 mg daily from 03/21/2020.     warfarin (COUMADIN) 5 MG tablet TAKE 1/2 (ONE-HALF) TABLET BY MOUTH ONCE DAILY     No current facility-administered medications for this visit.    Allergies  Allergen Reactions   Ciprofloxacin     Diarrhea and stomach pains   Lanoxin [Digoxin]    Lopressor [Metoprolol Tartrate]    Penicillins     Other reaction(s): Unknown   Sulfonamide Derivatives     Rash    Verapamil    Sulfa Antibiotics Rash    Review of Systems negative  except from HPI and PMH  Physical Exam: BP (!) 176/85 (BP Location: Right Arm, Patient Position: Sitting, Cuff Size: Normal)   Pulse 63   Ht 5\' 9"  (1.753 m)   Wt 189 lb (85.7 kg)   SpO2 98%   BMI 27.91 kg/m  Well developed and nourished in no acute distress HENT normal Neck supple with JVP-  flat  Lungs Clear Regular rate and rhythm, No murmurs, No gallops or Rubs  Abd-soft with active BS No Clubbing cyanosis edema Skin-warm and dry A & Oriented  Grossly normal sensory and motor function   ECG: Sinus rhythm at 63 Intervals 17/11/41 PACs  Assessment and  Plan  Atrial fibrillation-paroxysmal  PACs/PVCs  Sinus bradycardia  Hypertension  Orthostatic hypotension   Anemia post op   No interval atrial fibrillation of which he is aware.  We will continue flecainide as needed.  No bleeding on warfarin we will continue this.  We again discussed the physiology of orthostatic intolerance particularly in the context of his significant systolic supine hypertension.  Notably, his orthostatics today were without heart rate response suggesting autonomic failure as the incipient process.  Against this however is the absence of constipation dry eyes or dry mouth.  We will observe.  He is averse to more pharmacotherapy, pyridostigmine being a an option.  We will try nonpharmacological therapies including the abdominal binder again, wearing it just at night.  Recommended thigh sleeves during the day.  Reviewed the role of isometric contraction to augment venous return to try to mitigate upright hypotension.  He has raised the head of his bed.  I wonder whether urological consultation to try to decrease the frequency of his nocturia would be helpful.  Systolic supine hypertension is a big problem; we will have him take his diltiazem 30 mg just before bed.  I have asked him to measure his a.m. blood pressures prior to standing     I,Stephanie Williams,acting as a scribe for Virl Axe, MD.,have documented all relevant documentation on the behalf of Virl Axe, MD,as directed by  Virl Axe, MD while in the presence of Virl Axe, MD.  I, Virl Axe, MD, have reviewed all documentation for this visit. The documentation on 06/05/21 for the exam, diagnosis, procedures, and orders are all accurate and complete.

## 2021-06-05 NOTE — Telephone Encounter (Signed)
  Patient Consent for Virtual Visit        Robert Burch has provided verbal consent on 06/05/2021 for a virtual visit (video or telephone).   CONSENT FOR VIRTUAL VISIT FOR:  Robert Burch  By participating in this virtual visit I agree to the following:  I hereby voluntarily request, consent and authorize [facility] and its employed or contracted physicians, physician assistants, nurse practitioners or other licensed health care professionals (the Practitioner), to provide me with telemedicine health care services (the "Services") as deemed necessary by the treating Practitioner. I acknowledge and consent to receive the Services by the Practitioner via telemedicine. I understand that the telemedicine visit will involve communicating with the Practitioner through live audiovisual communication technology and the disclosure of certain medical information by electronic transmission. I acknowledge that I have been given the opportunity to request an in-person assessment or other available alternative prior to the telemedicine visit and am voluntarily participating in the telemedicine visit.  I understand that I have the right to withhold or withdraw my consent to the use of telemedicine in the course of my care at any time, without affecting my right to future care or treatment, and that the Practitioner or I may terminate the telemedicine visit at any time. I understand that I have the right to inspect all information obtained and/or recorded in the course of the telemedicine visit and may receive copies of available information for a reasonable fee.  I understand that some of the potential risks of receiving the Services via telemedicine include:  Delay or interruption in medical evaluation due to technological equipment failure or disruption; Information transmitted may not be sufficient (e.g. poor resolution of images) to allow for appropriate medical decision making by the  Practitioner; and/or  In rare instances, security protocols could fail, causing a breach of personal health information.  Furthermore, I acknowledge that it is my responsibility to provide information about my medical history, conditions and care that is complete and accurate to the best of my ability. I acknowledge that Practitioner's advice, recommendations, and/or decision may be based on factors not within their control, such as incomplete or inaccurate data provided by me or distortions of diagnostic images or specimens that may result from electronic transmissions. I understand that the practice of medicine is not an exact science and that Practitioner makes no warranties or guarantees regarding treatment outcomes. I acknowledge that a copy of this consent can be made available to me via my patient portal ([facility]), or I can request a printed copy by calling the office of [facility].    I understand that my insurance will be billed for this visit.   I have read or had this consent read to me. I understand the contents of this consent, which adequately explains the benefits and risks of the Services being provided via telemedicine.  I have been provided ample opportunity to ask questions regarding this consent and the Services and have had my questions answered to my satisfaction. I give my informed consent for the services to be provided through the use of telemedicine in my medical care

## 2021-07-08 ENCOUNTER — Telehealth: Payer: Self-pay | Admitting: Internal Medicine

## 2021-07-08 ENCOUNTER — Other Ambulatory Visit: Payer: Self-pay

## 2021-07-08 ENCOUNTER — Telehealth (INDEPENDENT_AMBULATORY_CARE_PROVIDER_SITE_OTHER): Payer: Medicare Other | Admitting: Internal Medicine

## 2021-07-08 VITALS — BP 141/76 | HR 62 | Wt 189.0 lb

## 2021-07-08 DIAGNOSIS — I480 Paroxysmal atrial fibrillation: Secondary | ICD-10-CM

## 2021-07-08 DIAGNOSIS — I951 Orthostatic hypotension: Secondary | ICD-10-CM

## 2021-07-08 MED ORDER — FLECAINIDE ACETATE 50 MG PO TABS: ORAL_TABLET | ORAL | 3 refills | Status: DC

## 2021-07-08 NOTE — Patient Instructions (Signed)
Medication Instructions:  Your physician recommends that you continue on your current medications as directed. Please refer to the Current Medication list given to you today.  *If you need a refill on your cardiac medications before your next appointment, please call your pharmacy*   Lab Work: None ordered.  If you have labs (blood work) drawn today and your tests are completely normal, you will receive your results only by: [facility] (if you have MyChart) OR A paper copy in the mail If you have any lab test that is abnormal or we need to change your treatment, we will call you to review the results.   Testing/Procedures: None ordered.    Follow-Up: At [facility], you and your health needs are our priority.  As part of our continuing mission to provide you with exceptional heart care, we have created designated Provider Care Teams.  These Care Teams include your primary Cardiologist (physician) and Advanced Practice Providers (APPs -  Physician Assistants and Nurse Practitioners) who all work together to provide you with the care you need, when you need it.  We recommend signing up for the patient portal called "MyChart".  Sign up information is provided on this After Visit Summary.  MyChart is used to connect with patients for Virtual Visits (Telemedicine).  Patients are able to view lab/test results, encounter notes, upcoming appointments, etc.  Non-urgent messages can be sent to your provider as well.   To learn more about what you can do with MyChart, go to [email or web address].    Your next appointment:    3-4 months with Dr [name]

## 2021-07-08 NOTE — Progress Notes (Signed)
Electrophysiology TeleHealth Note   Due to national recommendations of social distancing due to COVID 19, an audio/video telehealth visit is felt to be most appropriate for this patient at this time.  See MyChart message from today for the patient's consent to telehealth for [facility].   Date:  07/08/2021   ID:  Robert Burch, DOB [birth year], MRN [id number]  Location: patient's home  Provider location: [address] [place]  Evaluation Performed: Follow-up visit  PCP:  [name], MD  Cardiologist:   TG Mr. Electrophysiologist:  SK   Chief Complaint: orthostatic lightheadedness   History of Present Illness:    Robert Burch is a 87 y.o. male who presents via audio/video conferencing for a telehealth visit today.  Since last being seen in our clinic for persistent atrial fibrillation for which he previously took daily and now more recently as needed flecainide, orthostatic intolerance for which we have tried compression, using an abdominal binder at night and thigh sleeves during the day, daytime abdominal binders associated with constipation, the patient reports am LH Nocturnal binder>> constipation;  head of bed is elevated.  Nocturnal dizziness. Still struggling with a.m. dizziness particularly between about 10 and 2.  (See below) I wonder to what degree may be postprandial  No interval atrial fibrillation of which he is aware, no bleeding on warfarin  0700 BP 176/93 in bed 0900 BP 145/84 sitting 1000 BP 121/67 1600 BP 161/78 1900 BP 130/56  DATE TEST EF    7/16    Echo    55-60 % Mild LAE  7/16    myoview   35 % No ischemia-gut artifact   11/21 Echo 55-60%        Date Cr K Hgb  5/19 1.0 5.2 13.1`  8/20 0.9 4.4 13.3  4/21 0.97 4.9 11.1  2/22 1.0 4.5 12.2       The patient denies symptoms of fevers, chills, cough, or new SOB worrisome for COVID 19.    Past Medical History:  Diagnosis Date   Atypical chest pain    a.  Nonobstructive, very minor irregs by cath 2007;  b. 06/2009 Ex MV: small area of apical lateral ischemia;  c. 06/2009 Cath: essentially normal;  d. 04/2014 Lexiscan MV: EF 59%, no ischemia/infarct->low risk.   Cancer (HCC)    Chronic anticoagulation    a. Chronic Coumadin   Chronic anxiety    Diverticulosis    Dysrhythmia    atrial fib   GERD (gastroesophageal reflux disease)    Hypertension    IBS (irritable bowel syndrome)    Nonischemic cardiomyopathy ([place])    a. EF 45% by echo 2010;  b. 05/2012 Echo: EF 55-65%, no rwma, Gr 1 DD, mild AI/MR, mildly dil LA.   Paralyzed vocal cords    Paroxysmal atrial fibrillation ([place])    a. Treated with flecainide   Proteinuria    History of    Past Surgical History:  Procedure Laterality Date   CARDIAC CATHETERIZATION     cataract surgery Bilateral    HIP ARTHROPLASTY Right 03/17/2020   Procedure: ARTHROPLASTY BIPOLAR HIP (HEMIARTHROPLASTY);  Surgeon: [name], MD;  Location: [facility] ORS;  Service: Orthopedics;  Laterality: Right;   MASS BIOPSY  2013    Current Outpatient Medications  Medication Sig Dispense Refill   acidophilus (RISAQUAD) CAPS capsule Take 1 capsule by mouth daily.     bisacodyl (DULCOLAX) 5 MG EC tablet daily as needed.  Cholecalciferol (VITAMIN D3) 400 UNITS CAPS Take 800 Units by mouth daily.      clotrimazole-betamethasone (LOTRISONE) cream Apply topically 2 (two) times daily.     Coenzyme Q10 50 MG CAPS Take 50 mg by mouth 2 (two) times daily.      Cranberry 500 MG CAPS Take 500 mg by mouth daily.     diltiazem (CARDIZEM) 30 MG tablet TAKE 1 TABLET (30 MG) BY MOUTH ONCE DAILY AT BEDTIME 90 tablet 3   docusate sodium (COLACE) 100 MG capsule Take 1 capsule (100 mg total) by mouth 2 (two) times daily. 10 capsule 0   finasteride (PROSCAR) 5 MG tablet Take 1 tablet (5 mg total) by mouth daily. 30 tablet 11   Flaxseed, Linseed, (FLAX SEED OIL) 1000 MG CAPS Take 1,000 mg by mouth daily.      flecainide (TAMBOCOR) 50 MG  tablet Take 2 tablets (100 mg) by mouth twice daily as needed for recurrent atrial fibrillation 12 tablet 3   fluticasone (FLONASE) 50 MCG/ACT nasal spray Place into the nose as needed.     Ginger 500 MG CAPS Take 500 mg by mouth 2 (two) times daily.     hydrocortisone (ANUSOL-HC) 25 MG suppository Place 25 mg rectally daily as needed for hemorrhoids or anal itching.     magnesium oxide (MAG-OX) 400 MG tablet TAKE 1 TABLET BY MOUTH TWICE A DAY 180 tablet 2   mirabegron ER (MYRBETRIQ) 25 MG TB24 tablet Take 1 tablet (25 mg total) by mouth at bedtime. 30 tablet 11   Multiple Vitamin (MULTIVITAMIN) tablet Take 1 tablet by mouth daily.       nitroGLYCERIN (NITROSTAT) 0.4 MG SL tablet PLACE 1 TABLET (0.4 MG TOTAL) UNDER THE TONGUE EVERY 5 (FIVE) MINUTES AS NEEDED FOR CHEST PAIN. 90 tablet 0   Omega-3 Fatty Acids (FISH OIL) 1000 MG CAPS Take 1,000 mg by mouth daily.      pantoprazole (PROTONIX) 20 MG tablet Take 20 mg by mouth daily.      polyethylene glycol (MIRALAX / GLYCOLAX) packet Take 17 g by mouth daily as needed for mild constipation or moderate constipation.     saw palmetto 160 MG capsule Take 160 mg by mouth 2 (two) times daily.     sucralfate (CARAFATE) 1 g tablet Take by mouth as needed.     tamsulosin (FLOMAX) 0.4 MG CAPS capsule TAKE 1 CAPSULE (0.4 MG TOTAL) BY MOUTH 2 (TWO) TIMES DAILY. 180 capsule 3   traMADol (ULTRAM) 50 MG tablet Take 1 tablet (50 mg total) by mouth every 6 (six) hours as needed for moderate pain. 30 tablet 0   warfarin (COUMADIN) 2.5 MG tablet Take 1 tablet (2.5 mg total) by mouth every evening. Take 3.5 mg daily total dose on 03/19/2020 and 03/20/2020. Start taking 2.5 mg daily from 03/21/2020.     warfarin (COUMADIN) 5 MG tablet TAKE 1/2 (ONE-HALF) TABLET BY MOUTH ONCE DAILY     warfarin (COUMADIN) 1 MG tablet Take 1 tablet (1 mg total) by mouth daily for 1 day. Take total 3.5 mg daily on 03/19/2020 and 03/20/2020. 2 tablet 0   No current facility-administered  medications for this visit.    Allergies:   Ciprofloxacin, Lanoxin [digoxin], Lopressor [metoprolol tartrate], Penicillins, Sulfonamide derivatives, Verapamil, and Sulfa antibiotics   Social History:  The patient  reports that he has never smoked. He has never used smokeless tobacco. He reports that he does not drink alcohol and does not use drugs.   Family History:  The patient's   family history is not on file.   ROS:  Please see the history of present illness.   All other systems are personally reviewed and negative.    Exam:    Vital Signs:  BP (!) 141/76   Pulse 62   Wt 189 lb (85.7 kg)   BMI 27.91 kg/m     Labs/Other Tests and Data Reviewed:    Recent Labs: No results found for requested labs within last 8760 hours.   Wt Readings from Last 3 Encounters:  07/08/21 189 lb (85.7 kg)  06/05/21 189 lb (85.7 kg)  12/26/20 195 lb (88.5 kg)     Other studies personally reviewed: Additional studies/ records that were reviewed today include: (As above)     ASSESSMENT & PLAN:    Atrial fibrillation-paroxysmal   PACs/PVCs   Sinus bradycardia   Hypertension   Orthostatic hypotension   Reviewed isometric contraction and other mechanisms to mitigate orthostatic hypotension in the setting of supine hypertension  Again a lengthy discussion regarding the challenges of supine hypertension and orthostatic hypotension.  In light of his history of falls, not withstanding its identification is mechanical, we discussed the relative demerits of a blood pressure of 170 versus the risk of falling if we were to try to get his systolic blood pressure lower.  He is averse to more medication, as we had talked about something like pyridostigmine as well as early a.m. ProAmatine.    No intercurrent atrial fibrillation or flutter; continue flecainide 100 mg. .  To be used as needed  On Anticoagulant for thromboembolic risk reduction.  No bleeding issues.  Continue wqarfain .  With monthly  monitoring      COVID 19 screen The patient denies symptoms of COVID 19 at this time.  The importance of social distancing was discussed today.  Follow-up:  3-4 m    Current medicines are reviewed at length with the patient today.   The patient does not have concerns regarding his medicines.  The following changes were made today:  none but refills for flecainide   Labs/ tests ordered today include:  No orders of the defined types were placed in this encounter.   Future tests ( post COVID )     Patient Risk:  after full review of this patients clinical status, I feel that they are at moderate   risk at this time.  Today, I have spent 31  minutes with the patient with telehealth technology discussing the above.  Signed, Virl Axe, MD  07/08/2021 8:35 AM     [address] [place] [address] [phone] (office) [phone] (fax)

## 2021-07-08 NOTE — Telephone Encounter (Signed)
Patient would like isometric information discussed during visit sent via mail.   Dr. [name] told him he was printing a packet.

## 2021-07-11 NOTE — Telephone Encounter (Signed)
Copy of information packet mailed to pt's address in Epic.

## 2021-10-16 ENCOUNTER — Encounter: Payer: Self-pay | Admitting: Internal Medicine

## 2021-10-16 ENCOUNTER — Other Ambulatory Visit: Payer: Self-pay

## 2021-10-16 ENCOUNTER — Ambulatory Visit: Payer: Medicare Other | Admitting: Internal Medicine

## 2021-10-16 VITALS — BP 162/96 | HR 58 | Ht 69.0 in | Wt 189.0 lb

## 2021-10-16 DIAGNOSIS — I10 Essential (primary) hypertension: Secondary | ICD-10-CM | POA: Diagnosis not present

## 2021-10-16 DIAGNOSIS — I480 Paroxysmal atrial fibrillation: Secondary | ICD-10-CM | POA: Diagnosis not present

## 2021-10-16 DIAGNOSIS — I491 Atrial premature depolarization: Secondary | ICD-10-CM | POA: Diagnosis not present

## 2021-10-16 DIAGNOSIS — R001 Bradycardia, unspecified: Secondary | ICD-10-CM

## 2021-10-16 DIAGNOSIS — I951 Orthostatic hypotension: Secondary | ICD-10-CM

## 2021-10-16 DIAGNOSIS — I493 Ventricular premature depolarization: Secondary | ICD-10-CM | POA: Diagnosis not present

## 2021-10-16 NOTE — Progress Notes (Signed)
Patient ID: [name], male   DOB: 06-21-1933, 87 y.o.   MRN: [id number]      Patient Care Team: [name], MD as PCP - General (Family Medicine) Minna Merritts, MD as PCP - Cardiology (Cardiology) Deboraha Sprang, MD as PCP - Electrophysiology (Cardiology) Deboraha Sprang, MD as Consulting Physician (Cardiology) Minna Merritts, MD as Consulting Physician (Cardiology)   HPI  Robert Burch is a 87 y.o. male Seen in followup for paroxysmal atrial fibrillation in the setting of nonischemic coronary disease; Managed with flecainide taking regularly, but more recently prn because of concerned that it was contributing to dyspnea Previously referred to Dr Clearnce Hasten for consideration of pulm vein isolation; pt declined ablation anticoagulation with warfarin  .  Noted to have bradycardia with heart rates in the 40s.  Not clearly symptomatic.  Developed some dyspnea.  History of systolic hypertension aggravated by the recent introduction of Myrbetriq.  Also orthostatic hypotension 3/22 discussed nonpharmacological therapies, abdominal binder at night (abdominal binder was causing constipation when worn during the day) and thigh sleeves in the morning 7/22 daytime hypotension question postprandial hypotension and at that time had given him UP-TO-DATE patient information describing isometric maneuvers.  These and the thigh sleeves have markedly attenuated his symptoms.  No interval atrial fibrillation of which he is aware    DATE TEST EF   7/16    Echo    55-60 % Mild LAE  7/16    myoview   35 % No ischemia-gut artifact   11/21 Echo 55-60%     Date Cr K Hgb  5/19 1.0 5.2 13.1`  8/20 0.9 4.4 13.3  4/21 0.97 4.9 11.1  2/22 1.0 4.5 12.2  8/22   12.8    Records and Results Reviewed notes from Dr. Deidre Ala Holter monitors  Past Medical History:  Diagnosis Date   Atypical chest pain    a. Nonobstructive, very minor irregs by cath 2007;  b. 06/2009 Ex MV: small  area of apical lateral ischemia;  c. 06/2009 Cath: essentially normal;  d. 04/2014 Lexiscan MV: EF 59%, no ischemia/infarct->low risk.   Cancer (HCC)    Chronic anticoagulation    a. Chronic Coumadin   Chronic anxiety    Diverticulosis    Dysrhythmia    atrial fib   GERD (gastroesophageal reflux disease)    Hypertension    IBS (irritable bowel syndrome)    Nonischemic cardiomyopathy ([place])    a. EF 45% by echo 2010;  b. 05/2012 Echo: EF 55-65%, no rwma, Gr 1 DD, mild AI/MR, mildly dil LA.   Paralyzed vocal cords    Paroxysmal atrial fibrillation ([place])    a. Treated with flecainide   Proteinuria    History of    Past Surgical History:  Procedure Laterality Date   CARDIAC CATHETERIZATION     cataract surgery Bilateral    HIP ARTHROPLASTY Right 03/17/2020   Procedure: ARTHROPLASTY BIPOLAR HIP (HEMIARTHROPLASTY);  Surgeon: [name], MD;  Location: [facility] ORS;  Service: Orthopedics;  Laterality: Right;   MASS BIOPSY  2013    Current Outpatient Medications  Medication Sig Dispense Refill   acidophilus (RISAQUAD) CAPS capsule Take 1 capsule by mouth daily.     bisacodyl (DULCOLAX) 5 MG EC tablet daily as needed.     Cholecalciferol (VITAMIN D3) 400 UNITS CAPS Take 800 Units by mouth daily.      clotrimazole-betamethasone (LOTRISONE) cream Apply topically 2 (two) times daily.     Coenzyme Q10  50 MG CAPS Take 50 mg by mouth 2 (two) times daily.      Cranberry 500 MG CAPS Take 500 mg by mouth daily.     diltiazem (CARDIZEM) 30 MG tablet TAKE 1 TABLET (30 MG) BY MOUTH ONCE DAILY AT BEDTIME 90 tablet 3   docusate sodium (COLACE) 100 MG capsule Take 1 capsule (100 mg total) by mouth 2 (two) times daily. 10 capsule 0   finasteride (PROSCAR) 5 MG tablet Take 1 tablet (5 mg total) by mouth daily. 30 tablet 11   Flaxseed, Linseed, (FLAX SEED OIL) 1000 MG CAPS Take 1,000 mg by mouth daily.      flecainide (TAMBOCOR) 50 MG tablet Take 2 tablets (100 mg) by mouth twice daily as needed for recurrent  atrial fibrillation 12 tablet 3   fluticasone (FLONASE) 50 MCG/ACT nasal spray Place into the nose as needed.     Ginger 500 MG CAPS Take 500 mg by mouth 2 (two) times daily.     hydrocortisone (ANUSOL-HC) 25 MG suppository Place 25 mg rectally daily as needed for hemorrhoids or anal itching.     magnesium oxide (MAG-OX) 400 MG tablet TAKE 1 TABLET BY MOUTH TWICE A DAY 180 tablet 2   mirabegron ER (MYRBETRIQ) 25 MG TB24 tablet Take 1 tablet (25 mg total) by mouth at bedtime. 30 tablet 11   Multiple Vitamin (MULTIVITAMIN) tablet Take 1 tablet by mouth daily.       nitroGLYCERIN (NITROSTAT) 0.4 MG SL tablet PLACE 1 TABLET (0.4 MG TOTAL) UNDER THE TONGUE EVERY 5 (FIVE) MINUTES AS NEEDED FOR CHEST PAIN. 90 tablet 0   Omega-3 Fatty Acids (FISH OIL) 1000 MG CAPS Take 1,000 mg by mouth daily.      pantoprazole (PROTONIX) 20 MG tablet Take 20 mg by mouth daily.      polyethylene glycol (MIRALAX / GLYCOLAX) packet Take 17 g by mouth daily as needed for mild constipation or moderate constipation.     saw palmetto 160 MG capsule Take 160 mg by mouth 2 (two) times daily.     sucralfate (CARAFATE) 1 g tablet Take by mouth as needed.     tamsulosin (FLOMAX) 0.4 MG CAPS capsule TAKE 1 CAPSULE (0.4 MG TOTAL) BY MOUTH 2 (TWO) TIMES DAILY. 180 capsule 3   traMADol (ULTRAM) 50 MG tablet Take 1 tablet (50 mg total) by mouth every 6 (six) hours as needed for moderate pain. 30 tablet 0   warfarin (COUMADIN) 2.5 MG tablet Take 1 tablet (2.5 mg total) by mouth every evening. Take 3.5 mg daily total dose on 03/19/2020 and 03/20/2020. Start taking 2.5 mg daily from 03/21/2020.     warfarin (COUMADIN) 5 MG tablet TAKE 1/2 (ONE-HALF) TABLET BY MOUTH ONCE DAILY     warfarin (COUMADIN) 1 MG tablet Take 1 tablet (1 mg total) by mouth daily for 1 day. Take total 3.5 mg daily on 03/19/2020 and 03/20/2020. 2 tablet 0   No current facility-administered medications for this visit.    Allergies  Allergen Reactions    Ciprofloxacin     Diarrhea and stomach pains   Lanoxin [Digoxin]    Lopressor [Metoprolol Tartrate]    Penicillins     Other reaction(s): Unknown   Sulfonamide Derivatives     Rash    Verapamil    Sulfa Antibiotics Rash    Review of Systems negative except from HPI and PMH  Physical Exam: BP (!) 162/96 (BP Location: Left Arm, Patient Position: Sitting, Cuff Size: Normal)  Pulse (!) 58   Ht 5\' 9"  (1.753 m)   Wt 189 lb (85.7 kg)   SpO2 98%   BMI 27.91 kg/m   Well developed and nourished in no acute distress HENT normal Neck supple with JVP-  flat   Clear Regular rate and rhythm, no murmurs or gallops Abd-soft with active BS No Clubbing cyanosis edema Skin-warm and dry A & Oriented  Grossly normal sensory and motor function  ECG sinus @ 58 15/10/41 PAC     Assessment and  Plan  Atrial fibrillation-paroxysmal  PACs/PVCs  Sinus bradycardia  Hypertension  Orthostatic hypotension       No interval atrial fibrillation of which he is aware.  Continue as needed flecainide and warfarin for anticoagulation.  No bleeding.  Orthostatics remain much improved.  We will continue largely with nonpharmacological therapy including isometrics and thigh sleeves.  Neurological medications including Myrbetriq and Proscar may be aggravating both his systolic hypertension and his orthostasis.  As we discussed last time elevated systolic pressures however are preferable as long as they are not exorbitant to the risk of a fall in this 87 year old man  Anemia is improving

## 2021-10-16 NOTE — Patient Instructions (Signed)
Medication Instructions:  - Your physician recommends that you continue on your current medications as directed. Please refer to the Current Medication list given to you today.  *If you need a refill on your cardiac medications before your next appointment, please call your pharmacy*   Lab Work: - none ordered  If you have labs (blood work) drawn today and your tests are completely normal, you will receive your results only by: [place] (if you have MyChart) OR A paper copy in the mail If you have any lab test that is abnormal or we need to change your treatment, we will call you to review the results.   Testing/Procedures: - none ordered   Follow-Up: At [facility], you and your health needs are our priority.  As part of our continuing mission to provide you with exceptional heart care, we have created designated Provider Care Teams.  These Care Teams include your primary Cardiologist (physician) and Advanced Practice Providers (APPs -  Physician Assistants and Nurse Practitioners) who all work together to provide you with the care you need, when you need it.  We recommend signing up for the patient portal called "MyChart".  Sign up information is provided on this After Visit Summary.  MyChart is used to connect with patients for Virtual Visits (Telemedicine).  Patients are able to view lab/test results, encounter notes, upcoming appointments, etc.  Non-urgent messages can be sent to your provider as well.   To learn more about what you can do with MyChart, go to [email or web address].    Your next appointment:   1) 6 months with Dr. Rockey Situ  2) 1 year with  Dr. [name]   The format for your next appointment:   In Person  Provider:   As above   Other Instructions N/a

## 2021-10-20 ENCOUNTER — Telehealth: Payer: Self-pay | Admitting: Internal Medicine

## 2021-10-20 NOTE — Telephone Encounter (Signed)
Blood pressure and heart rate readings reviewed and WNL.  Will keep in Dr. Olin Pia nurse's box for review tomorrow.

## 2021-10-20 NOTE — Telephone Encounter (Signed)
Patient came by & dropped off blood pressure readings put in box

## 2021-10-21 NOTE — Telephone Encounter (Signed)
Reviewed readings below with Dr. [name]. Per Dr. [name], readings look very good- no further recommendations at this time.  Attempted to contact the patient to let him know the above. No answer- I left a detailed message on his voice mail (ok per DPR) that Dr. [name] reviewed his results and made no further recommendations at this time.  I asked that he call back with any further questions or concerns.

## 2021-10-21 NOTE — Telephone Encounter (Signed)
Patient last seen by Dr. [name] on 10/16/21. BP 162/96 (58) in office.   Home blood pressure readings pulled from nurse box:  10/10/21- 1030- 127/72 (52) 10/11/21- 0930- 126/71 (51) 10/12/21- 0930- 126/74 (54) 10/13/21- 0930- 129/71 (51) 10/14/21- 1000- 132/72 (54) 10/15/21- 1000- 127/70 (54) 10/16/21- 0800- 131/73 (57) 10/17/21- 1000- 137/77 (59)  To Dr. [name] to review.

## 2021-11-01 ENCOUNTER — Other Ambulatory Visit: Payer: Self-pay | Admitting: Internal Medicine

## 2021-12-02 ENCOUNTER — Other Ambulatory Visit: Payer: Self-pay | Admitting: Cardiovascular Disease

## 2021-12-06 ENCOUNTER — Other Ambulatory Visit: Payer: Self-pay | Admitting: Urology

## 2021-12-31 ENCOUNTER — Other Ambulatory Visit: Payer: Self-pay

## 2021-12-31 ENCOUNTER — Encounter: Payer: Self-pay | Admitting: Urology

## 2021-12-31 ENCOUNTER — Ambulatory Visit: Payer: Medicare Other | Admitting: Urology

## 2021-12-31 VITALS — BP 134/73 | HR 67 | Ht 69.0 in | Wt 187.0 lb

## 2021-12-31 DIAGNOSIS — N401 Enlarged prostate with lower urinary tract symptoms: Secondary | ICD-10-CM

## 2021-12-31 DIAGNOSIS — N3281 Overactive bladder: Secondary | ICD-10-CM

## 2021-12-31 DIAGNOSIS — N138 Other obstructive and reflux uropathy: Secondary | ICD-10-CM | POA: Diagnosis not present

## 2021-12-31 LAB — BLADDER SCAN AMB NON-IMAGING

## 2021-12-31 MED ORDER — MIRABEGRON ER 25 MG PO TB24: 25.0000 mg | ORAL_TABLET | Freq: Every day | ORAL | 11 refills | Status: DC

## 2021-12-31 NOTE — Patient Instructions (Signed)
Benign Prostatic Hyperplasia Benign prostatic hyperplasia (BPH) is an enlarged prostate gland that is caused by the normal aging process. The prostate may get bigger as a man gets older. The condition is not caused by cancer. The prostate is a walnut-sized gland that is involved in the production of semen. It is located in front of the rectum and below the bladder. The bladder stores urine. The urethra carries stored urine out of the body. An enlarged prostate can press on the urethra. This can make it harder to pass urine. The buildup of urine in the bladder can cause infection. Back pressure and infection may progress to bladder damage and kidney (renal) failure. What are the causes? This condition is part of the normal aging process. However, not all men develop problems from this condition. If the prostate enlarges away from the urethra, urine flow will not be blocked. If it enlarges toward the urethra and compresses it, there will be problems passing urine. What increases the risk? This condition is more likely to develop in men older than 50 years. What are the signs or symptoms? Symptoms of this condition include: Getting up often during the night to urinate. Needing to urinate frequently during the day. Difficulty starting urine flow. Decrease in size and strength of your urine stream. Leaking (dribbling) after urinating. Inability to pass urine. This needs immediate treatment. Inability to completely empty your bladder. Pain when you pass urine. This is more common if there is also an infection. Urinary tract infection (UTI). How is this diagnosed? This condition is diagnosed based on your medical history, a physical exam, and your symptoms. Tests will also be done, such as: A post-void bladder scan. This measures any amount of urine that may remain in your bladder after you finish urinating. A digital rectal exam. In a rectal exam, your health care provider checks your prostate by  putting a lubricated, gloved finger into your rectum to feel the back of your prostate gland. This exam detects the size of your gland and any abnormal lumps or growths. An exam of your urine (urinalysis). A prostate specific antigen (PSA) screening. This is a blood test used to screen for prostate cancer. An ultrasound. This test uses sound waves to electronically produce a picture of your prostate gland. Your health care provider may refer you to a specialist in kidney and prostate diseases (urologist). How is this treated? Once symptoms begin, your health care provider will monitor your condition (active surveillance or watchful waiting). Treatment for this condition will depend on the severity of your condition. Treatment may include: Observation and yearly exams. This may be the only treatment needed if your condition and symptoms are mild. Medicines to relieve your symptoms, including: Medicines to shrink the prostate. Medicines to relax the muscle of the prostate. Surgery in severe cases. Surgery may include: Prostatectomy. In this procedure, the prostate tissue is removed completely through an open incision or with a laparoscope or robotics. Transurethral resection of the prostate (TURP). In this procedure, a tool is inserted through the opening at the tip of the penis (urethra). It is used to cut away tissue of the inner core of the prostate. The pieces are removed through the same opening of the penis. This removes the blockage. Transurethral incision (TUIP). In this procedure, small cuts are made in the prostate. This lessens the prostate's pressure on the urethra. Transurethral microwave thermotherapy (TUMT). This procedure uses microwaves to create heat. The heat destroys and removes a small amount of prostate  tissue. Transurethral needle ablation (TUNA). This procedure uses radio frequencies to destroy and remove a small amount of prostate tissue. Interstitial laser coagulation ([place]).  This procedure uses a laser to destroy and remove a small amount of prostate tissue. Transurethral electrovaporization (TUVP). This procedure uses electrodes to destroy and remove a small amount of prostate tissue. Prostatic urethral lift. This procedure inserts an implant to push the lobes of the prostate away from the urethra. Follow these instructions at home: Take over-the-counter and prescription medicines only as told by your health care provider. Monitor your symptoms for any changes. Contact your health care provider with any changes. Avoid drinking large amounts of liquid before going to bed or out in public. Avoid or reduce how much caffeine or alcohol you drink. Give yourself time when you urinate. Keep all follow-up visits. This is important. Contact a health care provider if: You have unexplained back pain. Your symptoms do not get better with treatment. You develop side effects from the medicine you are taking. Your urine becomes very dark or has a bad smell. Your lower abdomen becomes distended and you have trouble passing urine. Get help right away if: You have a fever or chills. You suddenly cannot urinate. You feel light-headed or very dizzy, or you faint. There are large amounts of blood or clots in your urine. Your urinary problems become hard to manage. You develop moderate to severe low back or flank pain. The flank is the side of your body between the ribs and the hip. These symptoms may be an emergency. Get help right away. Call 911. Do not wait to see if the symptoms will go away. Do not drive yourself to the hospital. Summary Benign prostatic hyperplasia (BPH) is an enlarged prostate that is caused by the normal aging process. It is not caused by cancer. An enlarged prostate can press on the urethra. This can make it hard to pass urine. This condition is more likely to develop in men older than 50 years. Get help right away if you suddenly cannot urinate. This  information is not intended to replace advice given to you by your health care provider. Make sure you discuss any questions you have with your health care provider. Document Revised: 06/25/2021 Document Reviewed: 06/25/2021 Elsevier Patient Education  [place].  Overactive Bladder, Adult Overactive bladder is a condition in which a person has a sudden and frequent need to urinate. A person might also leak urine if he or she cannot get to the bathroom fast enough (urinary incontinence). Sometimes, symptoms can interfere with work or social activities. What are the causes? Overactive bladder is associated with poor nerve signals between your bladder and your brain. Your bladder may get the signal to empty before it is full. You may also have very sensitive muscles that make your bladder squeeze too soon. This condition may also be caused by other factors, such as: Medical conditions: Urinary tract infection. Infection of nearby tissues. Prostate enlargement. Bladder stones, inflammation, or tumors. Diabetes. Muscle or nerve weakness, especially from these conditions: A spinal cord injury. Stroke. Multiple sclerosis. Parkinson's disease. Other causes: Surgery on the uterus or urethra. Drinking too much caffeine or alcohol. Certain medicines, especially those that eliminate extra fluid in the body (diuretics). Constipation. What increases the risk? You may be at greater risk for overactive bladder if you: Are an older adult. Smoke. Are going through menopause. Have prostate problems. Have a neurological disease, such as stroke, dementia, Parkinson's disease, or multiple sclerosis (  MS). Eat or drink alcohol, spicy food, caffeine, and other things that irritate the bladder. Are overweight or obese. What are the signs or symptoms? Symptoms of this condition include a sudden, strong urge to urinate. Other symptoms include: Leaking urine. Urinating 8 or more times a day. Waking  up to urinate 2 or more times overnight. How is this diagnosed? This condition may be diagnosed based on: Your symptoms and medical history. A physical exam. Blood or urine tests to check for possible causes, such as infection. You may also need to see a health care provider who specializes in urinary tract problems. This is called a urologist. How is this treated? Treatment for overactive bladder depends on the cause of your condition and whether it is mild or severe. Treatment may include: Bladder training, such as: Learning to control the urge to urinate by following a schedule to urinate at regular intervals. Doing Kegel exercises to strengthen the pelvic floor muscles that support your bladder. Special devices, such as: Biofeedback. This uses sensors to help you become aware of your body's signals. Electrical stimulation. This uses electrodes placed inside the body (implanted) or outside the body. These electrodes send gentle pulses of electricity to strengthen the nerves or muscles that control the bladder. Women may use a plastic device, called a pessary, that fits into the vagina and supports the bladder. Medicines, such as: Antibiotics to treat bladder infection. Antispasmodics to stop the bladder from releasing urine at the wrong time. Tricyclic antidepressants to relax bladder muscles. Injections of botulinum toxin type A directly into the bladder tissue to relax bladder muscles. Surgery, such as: A device may be implanted to help manage the nerve signals that control urination. An electrode may be implanted to stimulate electrical signals in the bladder. A procedure may be done to change the shape of the bladder. This is done only in very severe cases. Follow these instructions at home: Eating and drinking  Make diet or lifestyle changes recommended by your health care provider. These may include: Drinking fluids throughout the day and not only with meals. Cutting down on  caffeine or alcohol. Eating a healthy and balanced diet to prevent constipation. This may include: Choosing foods that are high in fiber, such as beans, whole grains, and fresh fruits and vegetables. Limiting foods that are high in fat and processed sugars, such as fried and sweet foods. Lifestyle  Lose weight if needed. Do not use any products that contain nicotine or tobacco. These include cigarettes, chewing tobacco, and vaping devices, such as e-cigarettes. If you need help quitting, ask your health care provider. General instructions Take over-the-counter and prescription medicines only as told by your health care provider. If you were prescribed an antibiotic medicine, take it as told by your health care provider. Do not stop taking the antibiotic even if you start to feel better. Use any implants or pessary as told by your health care provider. If needed, wear pads to absorb urine leakage. Keep a log to track how much and when you drink, and when you need to urinate. This will help your health care provider monitor your condition. Keep all follow-up visits. This is important. Contact a health care provider if: You have a fever or chills. Your symptoms do not get better with treatment. Your pain and discomfort get worse. You have more frequent urges to urinate. Get help right away if: You are not able to control your bladder. Summary Overactive bladder refers to a condition in which a  person has a sudden and frequent need to urinate. Several conditions may lead to an overactive bladder. Treatment for overactive bladder depends on the cause and severity of your condition. Making lifestyle changes, doing Kegel exercises, keeping a log, and taking medicines can help with this condition. This information is not intended to replace advice given to you by your health care provider. Make sure you discuss any questions you have with your health care provider. Document Revised: 08/26/2020  Document Reviewed: 08/26/2020 Elsevier Patient Education  [place].

## 2021-12-31 NOTE — Progress Notes (Signed)
° °  12/31/2021 3:58 PM   Robert Burch June 25, 1933 [id number]  Reason for visit: Follow up BPH, OAB  HPI: I saw Robert Burch in urology clinic for follow-up of his chronic urinary symptoms.  Briefly is an 88 year old comorbid male with atrial fibrillation on Coumadin and long history of urinary symptoms.  He is on maximal medical therapy with Flomax, finasteride, as well as Myrbetriq 25 mg daily.  A CT in May 2019 showed a 125 g prostate.  PVRs have always been normal, and is normal again at 55mL today.  He denies any major changes in his urinary symptoms over the last year.  He previously was interested in stopping the Myrbetriq to see if he had any change in his urinary symptoms, but it sounds like he noticed worsening urgency and resume that medication.  He had problems with hypertension on the 50 mg dose, and takes the 25 mg Myrbetriq.  He continues to drink 2 cups of coffee in the morning and tea during the day.  His primary issue is urgency, as well as nocturia 4 times per night.  He denies any dysuria or gross hematuria.  Denies any UTIs or episodes of retention.  He is wearing a depends for some urgency incontinence.  I again recommended urodynamics with his mixed symptoms and very large prostate.  He remains hesitant, and would like to continue with medications for now.  We also reviewed behavioral strategies.  Return precautions discussed extensively.  RTC 1 year PVR Flomax, finasteride, and Myrbetriq refilled   Billey Co, MD  [facility] [address], [place], [place] [address] [phone]

## 2022-01-01 ENCOUNTER — Other Ambulatory Visit: Payer: Self-pay | Admitting: Urology

## 2022-01-13 ENCOUNTER — Other Ambulatory Visit: Payer: Self-pay | Admitting: Urology

## 2022-02-07 NOTE — Progress Notes (Signed)
Cardiology Office Note  Date:  02/09/2022   ID:  Robert Burch, Robert Burch Oct 06, 1933, MRN [id number]  PCP:  Robert Pitch, MD   Chief Complaint  Patient presents with   6 month follow up     Patient c/o shortness of breath with climbing up an incline. Medications reviewed by the patient verbally.     HPI:  88 yo with history of  paroxysmal atrial fibrillation,  Atypical chest pain,  catheterization in 2007 showing nonobstructive CAD,  Previous CT coronary calcium score in 2010 score of 68, predominately in the circumflex vessel Non smoker  PVCs, on magnesium Presenting for routine followup of his atrial fibrillation, orthostasis , chest pain symptoms.  Last seen in clinic by myself December 2021 Seen by Dr. [name] October 2022 Orthostatic hypotension  admission to the hospital, fall,  fracture right hip Mar 2021  [facility] twice a day Regular exercise  Some SOB when walking up a hill  BP at home 120 to 130 Elevated today, was rushing to get to the office  Sweats in winter, not in summer  Wearing thigh compression Denies orthostasis symptoms  Lab work reviewed A1c 6.0 Total chol 144, LDL 92  EKG personally reviewed by myself on todays visit NSR rate 56 bpm, no ST or T wave changes  Other past medical history reviewed Echo 10/2020: reviewed with him Normal EF 55%   Seen in the emergency room August 2019 for atypical chest pain  06/2009, Results reviewed with him Coronary Calcium Score: 68.2 (61.7 CFX, 6.5 LAD).   32nd percentile which is low risk overall.  Event monitor in April 2017 showing PVCs  Stress test July 2016 showing no ischemia Echocardiogram July 2016 showing normal LV function   Previously had significant GI workup for discomfort, most of which has been negative. This included an EGD, abdominal ultrasound. He does take Tylenol and gabapentin.    PMH:   has a past medical history of Atypical chest pain, Cancer ([name]), Chronic anticoagulation,  Chronic anxiety, Diverticulosis, Dysrhythmia, GERD (gastroesophageal reflux disease), Hypertension, IBS (irritable bowel syndrome), Nonischemic cardiomyopathy ([place]), Paralyzed vocal cords, Paroxysmal atrial fibrillation ([name]), and Proteinuria.  PSH:    Past Surgical History:  Procedure Laterality Date   CARDIAC CATHETERIZATION     cataract surgery Bilateral    HIP ARTHROPLASTY Right 03/17/2020   Procedure: ARTHROPLASTY BIPOLAR HIP (HEMIARTHROPLASTY);  Surgeon: [name], MD;  Location: [facility] ORS;  Service: Orthopedics;  Laterality: Right;   MASS BIOPSY  2013    Current Outpatient Medications  Medication Sig Dispense Refill   acidophilus (RISAQUAD) CAPS capsule Take 1 capsule by mouth daily.     bisacodyl (DULCOLAX) 5 MG EC tablet daily as needed.     Cholecalciferol (VITAMIN D3) 400 UNITS CAPS Take 800 Units by mouth daily.      clotrimazole-betamethasone (LOTRISONE) cream Apply topically 2 (two) times daily.     Coenzyme Q10 50 MG CAPS Take 50 mg by mouth 2 (two) times daily.      Cranberry 500 MG CAPS Take 500 mg by mouth daily.     diltiazem (CARDIZEM) 30 MG tablet TAKE 1 TABLET (30 MG) BY MOUTH ONCE DAILY AT BEDTIME 90 tablet 3   docusate sodium (COLACE) 100 MG capsule Take 1 capsule (100 mg total) by mouth 2 (two) times daily. 10 capsule 0   finasteride (PROSCAR) 5 MG tablet Take 1 tablet (5 mg total) by mouth daily. 30 tablet 11   Flaxseed, Linseed, (FLAX SEED OIL) 1000 MG CAPS  Take 1,000 mg by mouth daily.      flecainide (TAMBOCOR) 50 MG tablet Take 2 tablets (100 mg) by mouth twice daily as needed for recurrent atrial fibrillation 12 tablet 3   fluticasone (FLONASE) 50 MCG/ACT nasal spray Place into the nose as needed.     Ginger 500 MG CAPS Take 500 mg by mouth 2 (two) times daily.     hydrocortisone (ANUSOL-HC) 25 MG suppository Place 25 mg rectally daily as needed for hemorrhoids or anal itching.     magnesium oxide (MAG-OX) 400 MG tablet TAKE 1 TABLET BY MOUTH TWICE A DAY  180 tablet 2   mirabegron ER (MYRBETRIQ) 25 MG TB24 tablet Take 1 tablet (25 mg total) by mouth daily. 30 tablet 11   Multiple Vitamin (MULTIVITAMIN) tablet Take 1 tablet by mouth daily.       nitroGLYCERIN (NITROSTAT) 0.4 MG SL tablet PLACE 1 TABLET (0.4 MG TOTAL) UNDER THE TONGUE EVERY 5 (FIVE) MINUTES AS NEEDED FOR CHEST PAIN. 90 tablet 0   Omega-3 Fatty Acids (FISH OIL) 1000 MG CAPS Take 1,000 mg by mouth daily.      pantoprazole (PROTONIX) 20 MG tablet Take 20 mg by mouth daily.      polyethylene glycol (MIRALAX / GLYCOLAX) packet Take 17 g by mouth daily as needed for mild constipation or moderate constipation.     saw palmetto 160 MG capsule Take 160 mg by mouth 2 (two) times daily.     tamsulosin (FLOMAX) 0.4 MG CAPS capsule TAKE 1 CAPSULE BY MOUTH 2 TIMES DAILY. 180 capsule 3   warfarin (COUMADIN) 1 MG tablet Take 1 tablet (1 mg total) by mouth daily for 1 day. Take total 3.5 mg daily on 03/19/2020 and 03/20/2020. 2 tablet 0   warfarin (COUMADIN) 2.5 MG tablet Take 1 tablet (2.5 mg total) by mouth every evening. Take 3.5 mg daily total dose on 03/19/2020 and 03/20/2020. Start taking 2.5 mg daily from 03/21/2020.     warfarin (COUMADIN) 5 MG tablet TAKE 1/2 (ONE-HALF) TABLET BY MOUTH ONCE DAILY     No current facility-administered medications for this visit.     Allergies:   Ciprofloxacin, Lanoxin [digoxin], Lopressor [metoprolol tartrate], Penicillins, Sulfonamide derivatives, Verapamil, and Sulfa antibiotics   Social History:  The patient  reports that he has never smoked. He has never used smokeless tobacco. He reports that he does not drink alcohol and does not use drugs.   Family History:   family history is not on file.    Review of Systems: Review of Systems  Constitutional: Negative.        Sweats  HENT: Negative.    Respiratory: Negative.    Cardiovascular: Negative.   Gastrointestinal: Negative.   Musculoskeletal: Negative.        Leg pain  Neurological: Negative.    Psychiatric/Behavioral: Negative.    All other systems reviewed and are negative.  PHYSICAL EXAM: VS:  BP (!) 168/88 (BP Location: Left Arm, Patient Position: Sitting, Cuff Size: Normal)    Pulse (!) 56    Ht 5\' 8"  (1.727 m)    Wt 194 lb 4 oz (88.1 kg)    SpO2 98%    BMI 29.54 kg/m  , BMI Body mass index is 29.54 kg/m.  Constitutional:  oriented to person, place, and time. No distress.  HENT:  Head: Grossly normal Eyes:  no discharge. No scleral icterus.  Neck: No JVD, no carotid bruits  Cardiovascular: Regular rate and rhythm, no murmurs appreciated Pulmonary/Chest: Clear  to auscultation bilaterally, no wheezes or rails Abdominal: Soft.  no distension.  no tenderness.  Musculoskeletal: Normal range of motion Neurological:  normal muscle tone. Coordination normal. No atrophy Skin: Skin warm and dry Psychiatric: normal affect, pleasant  Recent Labs: No results found for requested labs within last 8760 hours.    Lipid Panel No results found for: CHOL, HDL, LDLCALC, TRIG    Wt Readings from Last 3 Encounters:  02/09/22 194 lb 4 oz (88.1 kg)  12/31/21 187 lb (84.8 kg)  10/16/21 189 lb (85.7 kg)     ASSESSMENT AND PLAN:  Paroxysmal atrial fibrillation (HCC) -  On meds as needed, Denies atrial fib, on warfarin Takes diltiazem 30 at night  Essential hypertension  Denies orthostasis symptoms, wearing thigh compressons BP 462-703 systolic at home  Chest heaviness - Previous stress test early 2019.  Prior cath, nonobstructive  No sx today  Gastroesophageal reflux disease without esophagitis Symptoms better on PPI, Carafate, H2 blocker stable  Anxiety  continue walking Stable, doing well today, walking twice a day helps  Hyperlipidemia Not on a statin total chol runs 144, walking twice a day   Total encounter time more than 30 minutes  Greater than 50% was spent in counseling and coordination of care with the patient   Orders Placed This Encounter  Procedures    EKG 12-Lead     Signed, Esmond Plants, M.D., Ph.D. 02/09/2022  [place], [place]

## 2022-02-09 ENCOUNTER — Ambulatory Visit: Payer: Medicare Other | Admitting: Cardiovascular Disease

## 2022-02-09 ENCOUNTER — Encounter: Payer: Self-pay | Admitting: Cardiovascular Disease

## 2022-02-09 ENCOUNTER — Other Ambulatory Visit: Payer: Self-pay

## 2022-02-09 VITALS — BP 168/88 | HR 56 | Ht 68.0 in | Wt 194.2 lb

## 2022-02-09 DIAGNOSIS — I480 Paroxysmal atrial fibrillation: Secondary | ICD-10-CM

## 2022-02-09 DIAGNOSIS — I428 Other cardiomyopathies: Secondary | ICD-10-CM

## 2022-02-09 DIAGNOSIS — I10 Essential (primary) hypertension: Secondary | ICD-10-CM | POA: Diagnosis not present

## 2022-02-09 DIAGNOSIS — R001 Bradycardia, unspecified: Secondary | ICD-10-CM

## 2022-02-09 DIAGNOSIS — I951 Orthostatic hypotension: Secondary | ICD-10-CM

## 2022-02-09 NOTE — Patient Instructions (Signed)
Medication Instructions:  No changes  If you need a refill on your cardiac medications before your next appointment, please call your pharmacy.   Lab work: No new labs needed  Testing/Procedures: No new testing needed  Follow-Up: At [facility], you and your health needs are our priority.  As part of our continuing mission to provide you with exceptional heart care, we have created designated Provider Care Teams.  These Care Teams include your primary Cardiologist (physician) and Advanced Practice Providers (APPs -  Physician Assistants and Nurse Practitioners) who all work together to provide you with the care you need, when you need it.  You will need a follow up appointment in 12 months  Providers on your designated Care Team:   Christopher Berge, NP Ryan Dunn, PA-C Cadence Furth, PA-C  COVID-19 Vaccine Information can be found at: [email or web address] For questions related to vaccine distribution or appointments, please email [email or web address] or call [phone].   

## 2022-03-11 ENCOUNTER — Other Ambulatory Visit: Payer: Self-pay | Admitting: Urology

## 2022-08-17 ENCOUNTER — Other Ambulatory Visit: Payer: Self-pay | Admitting: Cardiovascular Disease

## 2022-09-21 ENCOUNTER — Ambulatory Visit: Payer: Self-pay

## 2022-10-15 ENCOUNTER — Ambulatory Visit: Payer: Medicare Other | Attending: Internal Medicine | Admitting: Internal Medicine

## 2022-10-15 ENCOUNTER — Encounter: Payer: Self-pay | Admitting: Internal Medicine

## 2022-10-15 VITALS — BP 114/82 | HR 59 | Ht 69.0 in | Wt 191.0 lb

## 2022-10-15 DIAGNOSIS — R001 Bradycardia, unspecified: Secondary | ICD-10-CM

## 2022-10-15 DIAGNOSIS — I493 Ventricular premature depolarization: Secondary | ICD-10-CM | POA: Diagnosis not present

## 2022-10-15 DIAGNOSIS — I10 Essential (primary) hypertension: Secondary | ICD-10-CM | POA: Diagnosis not present

## 2022-10-15 DIAGNOSIS — I480 Paroxysmal atrial fibrillation: Secondary | ICD-10-CM | POA: Diagnosis not present

## 2022-10-15 DIAGNOSIS — I491 Atrial premature depolarization: Secondary | ICD-10-CM | POA: Diagnosis not present

## 2022-10-15 NOTE — Patient Instructions (Signed)
Medication Instructions:  - Your physician recommends that you continue on your current medications as directed. Please refer to the Current Medication list given to you today.  *If you need a refill on your cardiac medications before your next appointment, please call your pharmacy*   Lab Work: - none ordered  If you have labs (blood work) drawn today and your tests are completely normal, you will receive your results only by: MyChart Message (if you have MyChart) OR A paper copy in the mail If you have any lab test that is abnormal or we need to change your treatment, we will call you to review the results.   Testing/Procedures: - none ordered   Follow-Up: At [facility], you and your health needs are our priority.  As part of our continuing mission to provide you with exceptional heart care, we have created designated Provider Care Teams.  These Care Teams include your primary Cardiologist (physician) and Advanced Practice Providers (APPs -  Physician Assistants and Nurse Practitioners) who all work together to provide you with the care you need, when you need it.  We recommend signing up for the patient portal called "MyChart".  Sign up information is provided on this After Visit Summary.  MyChart is used to connect with patients for Virtual Visits (Telemedicine).  Patients are able to view lab/test results, encounter notes, upcoming appointments, etc.  Non-urgent messages can be sent to your provider as well.   To learn more about what you can do with MyChart, go to [email or web address].    Your next appointment:   1 year(s)  The format for your next appointment:   In Person  Provider:   Steven Klein, MD    Other Instructions N/a  Important Information About Sugar       

## 2022-10-15 NOTE — Progress Notes (Signed)
Patient ID: Robert Burch, male   DOB: Dec 24, 1932, 88 y.o.   MRN: [id number]      Patient Care Team: [name], MD as PCP - General (Family Medicine) Minna Merritts, MD as PCP - Cardiology (Cardiology) Deboraha Sprang, MD as PCP - Electrophysiology (Cardiology) Deboraha Sprang, MD as Consulting Physician (Cardiology) Minna Merritts, MD as Consulting Physician (Cardiology)   HPI  Robert Burch is a 88 y.o. male Seen in followup for paroxysmal atrial fibrillation in the setting of nonischemic coronary disease; Managed with flecainide taking regularly, but more recently prn because of concerned that it was contributing to dyspnea Previously referred to Dr Clearnce Hasten for consideration of pulm vein isolation; pt declined ablation anticoagulation with warfarin  .  Noted to have bradycardia with heart rates in the 40s.  Not clearly symptomatic.  Developed some dyspnea.    3/22 discussed nonpharmacological therapies, abdominal binder at night (abdominal binder was causing constipation when worn during the day) and thigh sleeves in the morning 7/22 daytime hypotension question postprandial hypotension and at that time had given him UP-TO-DATE patient information describing isometric maneuvers.  These and the thigh sleeves have markedly attenuated his symptoms.  Very brief episodes of atrial fibrillation i.e. 2-3 minutes. No bleeding on warfarin   Notes that he is somewhat dyspneic at the top of a long hill or stairs  No orthostatic dizziness  DATE TEST EF   7/16    Echo    55-60 % Mild LAE  7/16    myoview   35 % No ischemia-gut artifact   11/21 Echo 55-60%     Date Cr K Hgb  5/19 1.0 5.2 13.1`  8/20 0.9 4.4 13.3  4/21 0.97 4.9 11.1  2/22 1.0 4.5 12.2  8/22   12.8  8/23 0.9 4.9 13.1    Records and Results Reviewed notes from Dr. Deidre Ala Holter monitors  Past Medical History:  Diagnosis Date   Atypical chest pain    a. Nonobstructive, very minor irregs  by cath 2007;  b. 06/2009 Ex MV: small area of apical lateral ischemia;  c. 06/2009 Cath: essentially normal;  d. 04/2014 Lexiscan MV: EF 59%, no ischemia/infarct->low risk.   Cancer (HCC)    Chronic anticoagulation    a. Chronic Coumadin   Chronic anxiety    Diverticulosis    Dysrhythmia    atrial fib   GERD (gastroesophageal reflux disease)    Hypertension    IBS (irritable bowel syndrome)    Nonischemic cardiomyopathy ([place])    a. EF 45% by echo 2010;  b. 05/2012 Echo: EF 55-65%, no rwma, Gr 1 DD, mild AI/MR, mildly dil LA.   Paralyzed vocal cords    Paroxysmal atrial fibrillation ([place])    a. Treated with flecainide   Proteinuria    History of    Past Surgical History:  Procedure Laterality Date   CARDIAC CATHETERIZATION     cataract surgery Bilateral    HIP ARTHROPLASTY Right 03/17/2020   Procedure: ARTHROPLASTY BIPOLAR HIP (HEMIARTHROPLASTY);  Surgeon: [name], MD;  Location: [facility] ORS;  Service: Orthopedics;  Laterality: Right;   MASS BIOPSY  2013    Current Outpatient Medications  Medication Sig Dispense Refill   acidophilus (RISAQUAD) CAPS capsule Take 1 capsule by mouth daily.     alendronate (FOSAMAX) 70 MG tablet Take 70 mg by mouth once a week.     bisacodyl (DULCOLAX) 5 MG EC tablet daily as needed.  Cholecalciferol (VITAMIN D3) 400 UNITS CAPS Take 800 Units by mouth daily.      clotrimazole-betamethasone (LOTRISONE) cream Apply topically 2 (two) times daily.     Coenzyme Q10 50 MG CAPS Take 50 mg by mouth 2 (two) times daily.      Cranberry 500 MG CAPS Take 500 mg by mouth daily.     diltiazem (CARDIZEM) 30 MG tablet TAKE 1 TABLET (30 MG) BY MOUTH ONCE DAILY AT BEDTIME 90 tablet 3   docusate sodium (COLACE) 100 MG capsule Take 1 capsule (100 mg total) by mouth 2 (two) times daily. 10 capsule 0   finasteride (PROSCAR) 5 MG tablet TAKE 1 TABLET (5 MG TOTAL) BY MOUTH DAILY. 90 tablet 3   Flaxseed, Linseed, (FLAX SEED OIL) 1000 MG CAPS Take 1,000 mg by mouth daily.       flecainide (TAMBOCOR) 50 MG tablet Take 2 tablets (100 mg) by mouth twice daily as needed for recurrent atrial fibrillation 12 tablet 3   fluticasone (FLONASE) 50 MCG/ACT nasal spray Place into the nose as needed.     Ginger 500 MG CAPS Take 500 mg by mouth 2 (two) times daily.     hydrocortisone (ANUSOL-HC) 25 MG suppository Place 25 mg rectally daily as needed for hemorrhoids or anal itching.     magnesium oxide (MAG-OX) 400 MG tablet TAKE 1 TABLET BY MOUTH TWICE A DAY 180 tablet 1   mirabegron ER (MYRBETRIQ) 25 MG TB24 tablet Take 1 tablet (25 mg total) by mouth daily. 30 tablet 11   Multiple Vitamin (MULTIVITAMIN) tablet Take 1 tablet by mouth daily.       nitroGLYCERIN (NITROSTAT) 0.4 MG SL tablet PLACE 1 TABLET (0.4 MG TOTAL) UNDER THE TONGUE EVERY 5 (FIVE) MINUTES AS NEEDED FOR CHEST PAIN. 90 tablet 0   Omega-3 Fatty Acids (FISH OIL) 1000 MG CAPS Take 1,000 mg by mouth daily.      pantoprazole (PROTONIX) 20 MG tablet Take 20 mg by mouth daily.      polyethylene glycol (MIRALAX / GLYCOLAX) packet Take 17 g by mouth daily as needed for mild constipation or moderate constipation.     saw palmetto 160 MG capsule Take 160 mg by mouth 2 (two) times daily.     tamsulosin (FLOMAX) 0.4 MG CAPS capsule TAKE 1 CAPSULE BY MOUTH 2 TIMES DAILY. 180 capsule 3   warfarin (COUMADIN) 2.5 MG tablet Take 1 tablet (2.5 mg total) by mouth every evening. Take 3.5 mg daily total dose on 03/19/2020 and 03/20/2020. Start taking 2.5 mg daily from 03/21/2020.     No current facility-administered medications for this visit.    Allergies  Allergen Reactions   Ciprofloxacin     Diarrhea and stomach pains   Lanoxin [Digoxin]    Lopressor [Metoprolol Tartrate]    Penicillins     Other reaction(s): Unknown   Sulfonamide Derivatives     Rash    Verapamil    Sulfa Antibiotics Rash    Review of Systems negative except from HPI and PMH  Physical Exam: BP 114/82 (BP Location: Left Arm, Patient Position:  Sitting, Cuff Size: Large)   Pulse (!) 59   Ht '5\' 9"'$  (1.753 m)   Wt 191 lb (86.6 kg)   SpO2 98%   BMI 28.21 kg/m   Well developed and nourished in no acute distress HENT normal Neck supple with JVP-  flat   Clear Regular rate and rhythm, no murmurs or gallops Abd-soft with active BS No Clubbing cyanosis edema  Skin-warm and dry A & Oriented  Grossly normal sensory and motor function  ECG sinus at 59 Intervals 15/09/41  Walk test: 2 flights of stairs, heart rate 59--69  Assessment and  Plan  Atrial fibrillation-paroxysmal  PACs/PVCs  Sinus bradycardia chronotropic incompetence  Hypertension  Orthostatic hypotension   Blood pressure well controlled.  Continues Cardizem at night.  No interval atrial fibrillation.  Uses as needed flecainide which we will continue.  On Coumadin.  No bleeding. Not withstanding the chronotropic incompetence demonstrated by the walk test, his speed walking up the stairs was impressive and as such, I do not think his symptoms are sufficiently limiting at this juncture to think about proceeding with pacemaker although I did mention that as an alternative.

## 2022-11-28 ENCOUNTER — Other Ambulatory Visit: Payer: Self-pay | Admitting: Cardiovascular Disease

## 2022-12-10 ENCOUNTER — Other Ambulatory Visit: Payer: Self-pay | Admitting: Urology

## 2022-12-18 ENCOUNTER — Other Ambulatory Visit: Payer: Self-pay | Admitting: Internal Medicine

## 2022-12-31 ENCOUNTER — Ambulatory Visit: Payer: Medicare Other | Admitting: Urology

## 2022-12-31 ENCOUNTER — Encounter: Payer: Self-pay | Admitting: Urology

## 2022-12-31 VITALS — BP 148/75 | HR 69 | Ht 69.0 in | Wt 188.5 lb

## 2022-12-31 DIAGNOSIS — N401 Enlarged prostate with lower urinary tract symptoms: Secondary | ICD-10-CM

## 2022-12-31 DIAGNOSIS — N3281 Overactive bladder: Secondary | ICD-10-CM

## 2022-12-31 DIAGNOSIS — N138 Other obstructive and reflux uropathy: Secondary | ICD-10-CM

## 2022-12-31 MED ORDER — GEMTESA 75 MG PO TABS: 75.0000 mg | ORAL_TABLET | Freq: Every day | ORAL | 0 refills | Status: DC

## 2022-12-31 NOTE — Progress Notes (Signed)
   12/31/2022 1:23 PM   Callie Bunyard Zoll 1933/09/12 [id number]  Reason for visit: Follow up BPH, OAB  HPI: I saw Mr. Yurkovich in urology clinic for follow-up of his chronic urinary symptoms.  Briefly he is an 89 year old comorbid male with atrial fibrillation on Coumadin and long history of urinary symptoms.  He is on maximal medical therapy with Flomax, finasteride, as well as Myrbetriq 25 mg daily.  A CT in May 2019 showed a 125 g prostate.  PVRs have always been normal <8m.  He actually reports some improvement in his urinary symptoms over the last year.  He is voiding about every 4 hours during the day, nocturia once or twice overnight.  Minimal leakage, mild urgency.  Overall doing well.  Denies any episodes of retention, UTIs, or gross hematuria.  He is wondering if there are any other options besides the Myrbetriq that is expensive.  I think would be reasonable to try Gemtesa, especially since he had hypertension with the higher dose of the Myrbetriq.  He had some questions about outlet procedures like Rezum today.  With his prostate >100g would be a better candidate for either HOLEP or PAE, although at this time I strongly discouraged him from considering outlet procedures with his well-controlled symptoms on medications and higher risk for incontinence.  If he did ultimately opt to pursue a procedure, PAE would probably be a better option with his anticoagulation and overactive symptoms as the primary issue.  -Flomax and finasteride refilled -Trial of Gemtesa to replace Myrbetriq.  He will call to update uKoreain about a month to see how he is doing, and can refill the Gemtesa at that time, or go back to the Myrbetriq 25 mg daily -RTC 1 year PVR   BBilley Co MD  [id number] [place] [phone]

## 2023-01-06 ENCOUNTER — Telehealth: Payer: Self-pay

## 2023-01-06 NOTE — Telephone Encounter (Signed)
Called pt offered him an appointment to r/o UTI or other infection. Pt declines and states that he will wait and watch symptoms and will call back if things worsen. I also advised pt to increase water intake and cut back on acidic drinks such as orange juice which he has been drinking. Pt voiced understanding.

## 2023-01-06 NOTE — Telephone Encounter (Signed)
Pt is still having burning on the tip of his penis after urination. Pt seen on 12/31/22. Per [facility] no infection and looked normal per pt.   Still burning when urinating. Pt is wiping penis off after urinating and burning stops.   Pls advise. Pt aware [facility] out of office until 01/12/23.

## 2023-02-07 ENCOUNTER — Other Ambulatory Visit: Payer: Self-pay | Admitting: Urology

## 2023-02-07 DIAGNOSIS — N3281 Overactive bladder: Secondary | ICD-10-CM

## 2023-02-08 NOTE — Telephone Encounter (Signed)
Called pt to ensure he was not taking Gemtesa in addition to [employer]. Pt states that he was unable to afford Gemtesa and would like to go back to taking Myrbetriq. RX sent in.

## 2023-02-15 NOTE — Progress Notes (Unsigned)
Cardiology Office Note  Date:  02/16/2023   ID:  Robert Burch, Robert Burch [birth year], MRN [id number]  PCP:  [name], MD   Chief Complaint  Patient presents with   12 month follow up     Patient c/o breaking out into a sweat and has dizziness. Medications reviewed by the patient verbally.     HPI:  89 yo with history of  paroxysmal atrial fibrillation,  Atypical chest pain,  catheterization in 2007 showing nonobstructive CAD,  Previous CT coronary calcium score in 2010 score of 68, predominately in the circumflex vessel Non smoker  PVCs, on magnesium Presenting for routine followup of his atrial fibrillation, orthostasis , chest pain symptoms.  Last seen in clinic by myself 2023 Numerous issues to discuss on his visit Rare lightheaded spell, had 1 this Am, typically does exercise and goes away Can last for hours BP elevated today in office Blood pressure typically [id number] up to [id number] systolic at home on a regular basis with pulse in the 50s When having lightheaded spells on standing, has not been checking his blood pressure or heart rate  Takes flomax twice a day, timing of when he takes the medications is unclear Continues to have sweats in the afternoon, noted in the winter, better in warmer weather  Continues to wear his thigh-high compressions  Seen by Dr. [name] October 2022 Orthostatic hypotension  admission to the hospital, fall,  fracture right hip Mar 2021  Walks twice a day Regular exercise  Some SOB when walking up a hill  Lab work reviewed A1c 6.0 Total chol 144, LDL 92  EKG personally reviewed by myself on todays visit NSR rate 59 bpm, no ST or T wave changes  Other past medical history reviewed Echo 10/2020: reviewed with him Normal EF 55%   Seen in the emergency room August 2019 for atypical chest pain  06/2009, Results reviewed with him Coronary Calcium Score: 68.2 (61.7 CFX, 6.5 LAD).   32nd percentile which is low risk overall.  Event  monitor in April 2017 showing PVCs  Stress test July 2016 showing no ischemia Echocardiogram July 2016 showing normal LV function   Previously had significant GI workup for discomfort, most of which has been negative. This included an EGD, abdominal ultrasound. He does take Tylenol and gabapentin.    PMH:   has a past medical history of Atypical chest pain, Cancer ([place]), Chronic anticoagulation, Chronic anxiety, Diverticulosis, Dysrhythmia, GERD (gastroesophageal reflux disease), Hypertension, IBS (irritable bowel syndrome), Nonischemic cardiomyopathy ([place]), Paralyzed vocal cords, Paroxysmal atrial fibrillation ([place]), and Proteinuria.  PSH:    Past Surgical History:  Procedure Laterality Date   CARDIAC CATHETERIZATION     cataract surgery Bilateral    HIP ARTHROPLASTY Right 03/17/2020   Procedure: ARTHROPLASTY BIPOLAR HIP (HEMIARTHROPLASTY);  Surgeon: [name], MD;  Location: [facility] ORS;  Service: Orthopedics;  Laterality: Right;   MASS BIOPSY  2013    Current Outpatient Medications  Medication Sig Dispense Refill   acidophilus (RISAQUAD) CAPS capsule Take 1 capsule by mouth daily.     alendronate (FOSAMAX) 70 MG tablet Take 70 mg by mouth once a week.     bisacodyl (DULCOLAX) 5 MG EC tablet daily as needed.     Cholecalciferol (VITAMIN D3) 400 UNITS CAPS Take 800 Units by mouth daily.      Coenzyme Q10 50 MG CAPS Take 50 mg by mouth 2 (two) times daily.      Cranberry 500 MG CAPS Take 500 mg by mouth daily.  diltiazem (CARDIZEM) 30 MG tablet TAKE 1 TABLET (30 MG) BY MOUTH ONCE DAILY AT BEDTIME 90 tablet 3   docusate sodium (COLACE) 100 MG capsule Take 1 capsule (100 mg total) by mouth 2 (two) times daily. 10 capsule 0   finasteride (PROSCAR) 5 MG tablet TAKE 1 TABLET (5 MG TOTAL) BY MOUTH DAILY. 90 tablet 3   Flaxseed, Linseed, (FLAX SEED OIL) 1000 MG CAPS Take 1,000 mg by mouth daily.      flecainide (TAMBOCOR) 50 MG tablet Take 2 tablets (100 mg) by mouth twice daily as needed  for recurrent atrial fibrillation 12 tablet 3   fluticasone (FLONASE) 50 MCG/ACT nasal spray Place into the nose as needed.     fluticasone-salmeterol (ADVAIR) 250-50 MCG/ACT AEPB Inhale 1 puff into the lungs in the morning and at bedtime.     Ginger 500 MG CAPS Take 500 mg by mouth 2 (two) times daily.     hydrocortisone (ANUSOL-HC) 25 MG suppository Place 25 mg rectally daily as needed for hemorrhoids or anal itching.     magnesium oxide (MAG-OX) 400 MG tablet TAKE 1 TABLET BY MOUTH TWICE A DAY 180 tablet 0   Multiple Vitamin (MULTIVITAMIN) tablet Take 1 tablet by mouth daily.       MYRBETRIQ 25 MG TB24 tablet TAKE 1 TABLET (25 MG TOTAL) BY MOUTH DAILY. 30 tablet 11   nitroGLYCERIN (NITROSTAT) 0.4 MG SL tablet PLACE 1 TABLET (0.4 MG TOTAL) UNDER THE TONGUE EVERY 5 (FIVE) MINUTES AS NEEDED FOR CHEST PAIN. 90 tablet 0   Omega-3 Fatty Acids (FISH OIL) 1000 MG CAPS Take 1,000 mg by mouth daily.      pantoprazole (PROTONIX) 20 MG tablet Take 20 mg by mouth daily.      polyethylene glycol (MIRALAX / GLYCOLAX) packet Take 17 g by mouth daily as needed for mild constipation or moderate constipation.     saw palmetto 160 MG capsule Take 160 mg by mouth 2 (two) times daily.     tamsulosin (FLOMAX) 0.4 MG CAPS capsule TAKE 1 CAPSULE BY MOUTH TWICE A DAY 180 capsule 3   warfarin (COUMADIN) 2.5 MG tablet Take 1 tablet (2.5 mg total) by mouth every evening. Take 3.5 mg daily total dose on 03/19/2020 and 03/20/2020. Start taking 2.5 mg daily from 03/21/2020.     No current facility-administered medications for this visit.     Allergies:   Ciprofloxacin, Lanoxin [digoxin], Lopressor [metoprolol tartrate], Penicillins, Sulfonamide derivatives, Verapamil, and Sulfa antibiotics   Social History:  The patient  reports that he has never smoked. He has never used smokeless tobacco. He reports that he does not drink alcohol and does not use drugs.   Family History:   family history is not on file.    Review of  Systems: Review of Systems  Constitutional: Negative.        Sweats  HENT: Negative.    Respiratory: Negative.    Cardiovascular: Negative.   Gastrointestinal: Negative.   Musculoskeletal: Negative.        Leg pain  Neurological:  Positive for dizziness.  Psychiatric/Behavioral: Negative.    All other systems reviewed and are negative.   PHYSICAL EXAM: VS:  BP (!) 160/80 (BP Location: Left Arm, Patient Position: Sitting, Cuff Size: Normal)   Pulse (!) 59   Ht '5\' 9"'$  (1.753 m)   Wt 190 lb (86.2 kg)   SpO2 98%   BMI 28.06 kg/m  , BMI Body mass index is 28.06 kg/m.  Constitutional:  oriented  to person, place, and time. No distress.  HENT:  Head: Grossly normal Eyes:  no discharge. No scleral icterus.  Neck: No JVD, no carotid bruits  Cardiovascular: Regular rate and rhythm, no murmurs appreciated Pulmonary/Chest: Clear to auscultation bilaterally, no wheezes or rails Abdominal: Soft.  no distension.  no tenderness.  Musculoskeletal: Normal range of motion Neurological:  normal muscle tone. Coordination normal. No atrophy Skin: Skin warm and dry Psychiatric: normal affect, pleasant  Recent Labs: No results found for requested labs within last 365 days.    Lipid Panel No results found for: "CHOL", "HDL", "LDLCALC", "TRIG"    Wt Readings from Last 3 Encounters:  02/16/23 190 lb (86.2 kg)  12/31/22 188 lb 8 oz (85.5 kg)  10/15/22 191 lb (86.6 kg)     ASSESSMENT AND PLAN:  Paroxysmal atrial fibrillation ([place]) -  Denies symptoms concerning for arrhythmia , on warfarin Takes diltiazem 30 at night Recommended he monitor blood pressure and heart rate when he has orthostasis symptoms  Essential hypertension   wearing thigh compressons BP [id number] systolic at home, rates in the 50s Recommend close monitoring for lightheaded spells that last for hours on select days, recommend he stay hydrated Also check his numbers when he has sweats in the afternoons  Chest heaviness  - Previous stress test early 2019.  Prior cath, nonobstructive  No further cardiac workup needed  Gastroesophageal reflux disease without esophagitis Symptoms better on PPI, Carafate, H2 blocker stable  Anxiety Recommend he continue walking program Reassurance provided  Hyperlipidemia Not on a statin total chol runs 144, walking twice a day  Leg cramps Etiology unclear, recommend he stay hydrated, Consider over-the-counter potassium and magnesium pill   Total encounter time more than 40 minutes  Greater than 50% was spent in counseling and coordination of care with the patient   No orders of the defined types were placed in this encounter.    Signed, Esmond Plants, M.D., Ph.D. 02/16/2023  [place], [place]

## 2023-02-16 ENCOUNTER — Encounter: Payer: Self-pay | Admitting: Cardiovascular Disease

## 2023-02-16 ENCOUNTER — Ambulatory Visit: Payer: Medicare Other | Attending: Cardiovascular Disease | Admitting: Cardiovascular Disease

## 2023-02-16 VITALS — BP 160/80 | HR 59 | Ht 69.0 in | Wt 190.0 lb

## 2023-02-16 DIAGNOSIS — I480 Paroxysmal atrial fibrillation: Secondary | ICD-10-CM | POA: Diagnosis not present

## 2023-02-16 DIAGNOSIS — I951 Orthostatic hypotension: Secondary | ICD-10-CM

## 2023-02-16 DIAGNOSIS — I10 Essential (primary) hypertension: Secondary | ICD-10-CM

## 2023-02-16 DIAGNOSIS — R001 Bradycardia, unspecified: Secondary | ICD-10-CM

## 2023-02-16 DIAGNOSIS — I428 Other cardiomyopathies: Secondary | ICD-10-CM

## 2023-02-16 DIAGNOSIS — I491 Atrial premature depolarization: Secondary | ICD-10-CM

## 2023-02-16 NOTE — Patient Instructions (Addendum)
When you have a lightheaded spell, Check your blood pressure when standing  Also, check you blood pressure and heart rate when having with sweats  See if Dr. Lovie Macadamia can check testosterone level  Medication Instructions:  Talk to urology, Try flomax once a day in the evening, do not take in the morning  If you need a refill on your cardiac medications before your next appointment, please call your pharmacy.   Lab work: No new labs needed  Testing/Procedures: No new testing needed  Follow-Up: At [facility], you and your health needs are our priority.  As part of our continuing mission to provide you with exceptional heart care, we have created designated Provider Care Teams.  These Care Teams include your primary Cardiologist (physician) and Advanced Practice Providers (APPs -  Physician Assistants and Nurse Practitioners) who all work together to provide you with the care you need, when you need it.  You will need a follow up appointment in 6 months  Providers on your designated Care Team:   Murray Hodgkins, NP Christell Faith, PA-C Cadence Kathlen Mody, [place]  COVID-19 Vaccine Information can be found at: [email or web address].uk For questions related to vaccine distribution or appointments, please email [email or web address] or call [phone].

## 2023-04-21 ENCOUNTER — Other Ambulatory Visit: Payer: Self-pay | Admitting: Urology

## 2023-05-06 ENCOUNTER — Other Ambulatory Visit: Payer: Self-pay | Admitting: Urology

## 2023-05-06 DIAGNOSIS — N3281 Overactive bladder: Secondary | ICD-10-CM

## 2023-06-06 ENCOUNTER — Other Ambulatory Visit: Payer: Self-pay | Admitting: Cardiovascular Disease

## 2023-08-14 NOTE — Progress Notes (Unsigned)
Cardiology Office Note  Date:  08/16/2023   ID:  Lamari, Moscone 11/03/1933, MRN [id number]  PCP:  Dorothey Baseman, MD   Chief Complaint  Patient presents with   6 month follow up     Patient c/o shortness of breath with walking up an incline. Medications reviewed by the patient verbally.     HPI:  89 yo with history of  paroxysmal atrial fibrillation,  Atypical chest pain,  catheterization in 2007 showing nonobstructive CAD,  Previous CT coronary calcium score in 2010 score of 68, predominately in the circumflex vessel Non smoker  PVCs, on magnesium Chronic orthostasis Presenting for routine followup of his atrial fibrillation, orthostasis , chest pain symptoms.  Last seen in clinic by myself 2/24 Presents today with his son, lots of issues to discuss Walks one mile a day, some shortness of breath on some of the hills  Brings blood pressure measurements with him today, clear orthostasis noted Typically drop in pressure into 80 systolic at 10 AM with standing Heart rate in 50s Continues to wear his thigh-high compressions Reports drinking orange juice several cups of coffee in the morning  Taking tamsulosin twice a day noon and dinner Taking Myrbetriq , finasteride  Followed by urology  Having  palpitations, extra beats Denies significant atrial fibrillation symptoms Some confusion went to take flecainide and how many  Reports for the past 3 to 4 years has had flushing typically when in a sitting position, Will soak his shirt, then gets chilled  Seen by Dr. Graciela Husbands October 2022 Orthostatic hypotension  admission to the hospital, fall,  fracture right hip Mar 2021  Lab work reviewed A1c 6.0 Total chol 144, LDL 92  EKG personally reviewed by myself on todays visit EKG Interpretation Date/Time:  Monday August 16 2023 11:50:46 EDT Ventricular Rate:  59 PR Interval:  172 QRS Duration:  98 QT Interval:  408 QTC Calculation: 403 R Axis:   -4  Text  Interpretation: Sinus bradycardia When compared with ECG of 17-Mar-2020 09:01, No significant change was found Confirmed by Julien Nordmann [phone]) on 08/16/2023 11:56:41 AM    Other past medical history reviewed Echo 10/2020: reviewed with him Normal EF 55%   Seen in the emergency room August 2019 for atypical chest pain  06/2009, Results reviewed with him Coronary Calcium Score: 68.2 (61.7 CFX, 6.5 LAD).   32nd percentile which is low risk overall.  Event monitor in April 2017 showing PVCs  Stress test July 2016 showing no ischemia Echocardiogram July 2016 showing normal LV function   Previously had significant GI workup for discomfort, most of which has been negative. This included an EGD, abdominal ultrasound. He does take Tylenol and gabapentin.    PMH:   has a past medical history of Atypical chest pain, Cancer ([facility]), Chronic anticoagulation, Chronic anxiety, Diverticulosis, Dysrhythmia, GERD (gastroesophageal reflux disease), Hypertension, IBS (irritable bowel syndrome), Nonischemic cardiomyopathy ([facility]), Paralyzed vocal cords, Paroxysmal atrial fibrillation ([facility]), and Proteinuria.  PSH:    Past Surgical History:  Procedure Laterality Date   CARDIAC CATHETERIZATION     cataract surgery Bilateral    HIP ARTHROPLASTY Right 03/17/2020   Procedure: ARTHROPLASTY BIPOLAR HIP (HEMIARTHROPLASTY);  Surgeon: Signa Kell, MD;  Location: [facility] ORS;  Service: Orthopedics;  Laterality: Right;   MASS BIOPSY  2013    Current Outpatient Medications  Medication Sig Dispense Refill   acidophilus (RISAQUAD) CAPS capsule Take 1 capsule by mouth daily.     alendronate (FOSAMAX) 70 MG tablet Take 70 mg  by mouth once a week.     bisacodyl (DULCOLAX) 5 MG EC tablet daily as needed.     Cholecalciferol (VITAMIN D3) 400 UNITS CAPS Take 800 Units by mouth daily.      Coenzyme Q10 50 MG CAPS Take 50 mg by mouth 2 (two) times daily.      Cranberry 500 MG CAPS Take 500 mg by mouth daily.     diltiazem  (CARDIZEM) 30 MG tablet TAKE 1 TABLET (30 MG) BY MOUTH ONCE DAILY AT BEDTIME 90 tablet 3   docusate sodium (COLACE) 100 MG capsule Take 1 capsule (100 mg total) by mouth 2 (two) times daily. 10 capsule 0   finasteride (PROSCAR) 5 MG tablet TAKE 1 TABLET (5 MG TOTAL) BY MOUTH DAILY. 90 tablet 3   Flaxseed, Linseed, (FLAX SEED OIL) 1000 MG CAPS Take 1,000 mg by mouth daily.      flecainide (TAMBOCOR) 50 MG tablet Take 2 tablets (100 mg) by mouth twice daily as needed for recurrent atrial fibrillation 12 tablet 3   fluticasone (FLONASE) 50 MCG/ACT nasal spray Place into the nose as needed.     fluticasone-salmeterol (ADVAIR) 250-50 MCG/ACT AEPB Inhale 1 puff into the lungs in the morning and at bedtime.     Ginger 500 MG CAPS Take 500 mg by mouth 2 (two) times daily.     hydrocortisone (ANUSOL-HC) 25 MG suppository Place 25 mg rectally daily as needed for hemorrhoids or anal itching.     magnesium oxide (MAG-OX) 400 MG tablet TAKE 1 TABLET BY MOUTH TWICE A DAY 60 tablet 1   Multiple Vitamin (MULTIVITAMIN) tablet Take 1 tablet by mouth daily.       MYRBETRIQ 25 MG TB24 tablet TAKE 1 TABLET (25 MG TOTAL) BY MOUTH DAILY. 30 tablet 11   Omega-3 Fatty Acids (FISH OIL) 1000 MG CAPS Take 1,000 mg by mouth daily.      pantoprazole (PROTONIX) 20 MG tablet Take 20 mg by mouth daily.      polyethylene glycol (MIRALAX / GLYCOLAX) packet Take 17 g by mouth daily as needed for mild constipation or moderate constipation.     saw palmetto 160 MG capsule Take 160 mg by mouth 2 (two) times daily.     tamsulosin (FLOMAX) 0.4 MG CAPS capsule TAKE 1 CAPSULE BY MOUTH TWICE A DAY 180 capsule 3   warfarin (COUMADIN) 2.5 MG tablet Take 1 tablet (2.5 mg total) by mouth every evening. Take 3.5 mg daily total dose on 03/19/2020 and 03/20/2020. Start taking 2.5 mg daily from 03/21/2020.     No current facility-administered medications for this visit.    Allergies:   Ciprofloxacin, Lanoxin [digoxin], Lopressor [metoprolol  tartrate], Penicillins, Sulfonamide derivatives, Verapamil, and Sulfa antibiotics   Social History:  The patient  reports that he has never smoked. He has never used smokeless tobacco. He reports that he does not drink alcohol and does not use drugs.   Family History:   family history is not on file.    Review of Systems: Review of Systems  Constitutional: Negative.        Sweats  HENT: Negative.    Respiratory: Negative.    Cardiovascular: Negative.   Gastrointestinal: Negative.   Musculoskeletal: Negative.        Leg pain  Neurological:  Positive for dizziness.  Psychiatric/Behavioral: Negative.    All other systems reviewed and are negative.   PHYSICAL EXAM: VS:  BP (!) 140/80 (BP Location: Left Arm, Patient Position: Sitting, Cuff Size:  Normal)   Pulse (!) 59   Ht 5\' 9"  (1.753 m)   Wt 188 lb 4 oz (85.4 kg)   SpO2 98%   BMI 27.80 kg/m  , BMI Body mass index is 27.8 kg/m.  Constitutional:  oriented to person, place, and time. No distress.  HENT:  Head: Grossly normal Eyes:  no discharge. No scleral icterus.  Neck: No JVD, no carotid bruits  Cardiovascular: Regular rate and rhythm, no murmurs appreciated Pulmonary/Chest: Clear to auscultation bilaterally, no wheezes or rails Abdominal: Soft.  no distension.  no tenderness.  Musculoskeletal: Normal range of motion Neurological:  normal muscle tone. Coordination normal. No atrophy Skin: Skin warm and dry Psychiatric: normal affect, pleasant  Recent Labs: No results found for requested labs within last 365 days.    Lipid Panel No results found for: "CHOL", "HDL", "LDLCALC", "TRIG"    Wt Readings from Last 3 Encounters:  08/16/23 188 lb 4 oz (85.4 kg)  02/16/23 190 lb (86.2 kg)  12/31/22 188 lb 8 oz (85.5 kg)     ASSESSMENT AND PLAN:  Paroxysmal atrial fibrillation ([facility]) -  Denies significant tachypalpitations concerning for atrial fibrillation Takes diltiazem 30 at night Has not required flecainide as  needed  Essential hypertension  Continued orthostasis  wearing thigh compressons Hydrating in the morning Drop of systolic pressure 120 into the 80s, measurements at 10 AM Recommend he decrease tamsulosin down to 1 pill at noon, skip the evening pill Discussed with urology whether there are other options given his chronic orthostasis If blood pressure continues to drop and no other options other than tamsulosin, may need to start midodrine  Chest heaviness - Previous stress test early 2019.  Prior cath, nonobstructive  Denies chest pain concerning for angina  Gastroesophageal reflux disease without esophagitis Symptoms better on PPI, Carafate, H2 blocker stable  Anxiety concerning health Recommend he continue walking program  Hyperlipidemia Not on a statin Reasonable cholesterol  Flushing Etiology unclear, from a sitting position, will sweat until he soaked his shirt and gets chilled.  Recommend discussed with urology  Leg cramps Not an active issue discussed on today's visit   Total encounter time more than 40 minutes  Greater than 50% was spent in counseling and coordination of care with the patient   Orders Placed This Encounter  Procedures   EKG 12-Lead     Signed, Dossie Arbour, M.D., Ph.D. 08/16/2023  [facility] [place], [place] [phone]

## 2023-08-16 ENCOUNTER — Ambulatory Visit: Payer: Medicare Other | Attending: Cardiovascular Disease | Admitting: Cardiovascular Disease

## 2023-08-16 ENCOUNTER — Encounter: Payer: Self-pay | Admitting: Cardiovascular Disease

## 2023-08-16 VITALS — BP 140/80 | HR 59 | Ht 69.0 in | Wt 188.2 lb

## 2023-08-16 DIAGNOSIS — I10 Essential (primary) hypertension: Secondary | ICD-10-CM | POA: Diagnosis not present

## 2023-08-16 DIAGNOSIS — I951 Orthostatic hypotension: Secondary | ICD-10-CM

## 2023-08-16 DIAGNOSIS — I491 Atrial premature depolarization: Secondary | ICD-10-CM | POA: Diagnosis not present

## 2023-08-16 DIAGNOSIS — I493 Ventricular premature depolarization: Secondary | ICD-10-CM

## 2023-08-16 DIAGNOSIS — I480 Paroxysmal atrial fibrillation: Secondary | ICD-10-CM | POA: Diagnosis not present

## 2023-08-16 DIAGNOSIS — R001 Bradycardia, unspecified: Secondary | ICD-10-CM

## 2023-08-16 DIAGNOSIS — I428 Other cardiomyopathies: Secondary | ICD-10-CM

## 2023-08-16 DIAGNOSIS — E782 Mixed hyperlipidemia: Secondary | ICD-10-CM

## 2023-08-16 MED ORDER — FLECAINIDE ACETATE 50 MG PO TABS: ORAL_TABLET | ORAL | 3 refills | Status: DC

## 2023-08-16 NOTE — Patient Instructions (Addendum)
Medication Instructions:  Please take tamsulosin once a day at lunch, stop the night pill  For atrial fibrillation, take flecainide 50 mg two pills twice a day as needed  If you need a refill on your cardiac medications before your next appointment, please call your pharmacy.   Lab work: No new labs needed  Testing/Procedures: No new testing needed  Follow-Up: At [facility] you and your health needs are our priority.  As part of our continuing mission to provide you with exceptional heart care, we have created designated Provider Care Teams.  These Care Teams include your primary Cardiologist (physician) and Advanced Practice Providers (APPs -  Physician Assistants and Nurse Practitioners) who all work together to provide you with the care you need, when you need it.  You will need a follow up appointment in 12 months  Providers on your designated Care Team:   Nicolasa Ducking, NP Eula Listen, PA-C Cadence Fransico Michael, [place]  COVID-19 Vaccine Information can be found at: PodExchange.nl For questions related to vaccine distribution or appointments, please email [email or web address] or call [phone].

## 2023-08-18 ENCOUNTER — Telehealth: Payer: Self-pay | Admitting: Internal Medicine

## 2023-08-18 NOTE — Telephone Encounter (Signed)
Pt called in about his appt. He is very confused on when he is supposed to f/u with each other. Explained to him based on each AVS he is to f/u with Dr. Graciela Husbands and Dr. Mariah Milling in 1 year from last appt. He wants me to ask if he still needs his appt with Dr. Graciela Husbands in October. Please advise.

## 2023-08-18 NOTE — Telephone Encounter (Signed)
Patient calling requesting that his appointment with Dr. Graciela Husbands to be rescheduled. Patient states that he alternates seeing Dr. Graciela Husbands and Dr. Mariah Milling every 6 months. He says that his appointment with Dr. Graciela Husbands is only 2 months after his appointment with Dr. Mariah Milling. Patient scheduled with Dr. Graciela Husbands for 12/20/23.

## 2023-08-20 NOTE — Progress Notes (Unsigned)
08/24/2023 4:13 PM   Robert Burch March 07, 1933 [id number]  Referring provider: Dorothey Baseman, MD [phone] [address] [place],  [place] [address]  Urological history: 1. BPH with LU TS -prostate volume 125 grams -tamsulosin 0.4 mg daily and finasteride 5 mg daily and Myrbetriq 25 mg daily  Chief Complaint  Patient presents with   Penis Pain   HPI: Robert Burch is a 89 y.o. male who presents today for penile ache.    Previous records reviewed.   He has been having burning at the end of penis since January 2024.  It comes and goes.  Patient denies any modifying or aggravating factors.  Patient denies any recent UTI's, gross hematuria or suprapubic/flank pain.  Patient denies any fevers, chills, nausea or vomiting.    UA yellow slightly cloudy, specific gravity, 1+ heme, 2+ leuks, >30 WBC's, 3-10 RBC's, 0-10 epithelial cells and moderate bacteria.    PVR 5 mL   He has also been on Myrbetriq and Gemtesa in the past for his OAB and he is not certain which of the medication is more effective.  He would like more samples of the Gemtesa.  PMH: Past Medical History:  Diagnosis Date   Atypical chest pain    a. Nonobstructive, very minor irregs by cath 2007;  b. 06/2009 Ex MV: small area of apical lateral ischemia;  c. 06/2009 Cath: essentially normal;  d. 04/2014 Lexiscan MV: EF 59%, no ischemia/infarct->low risk.   Cancer (HCC)    Chronic anticoagulation    a. Chronic Coumadin   Chronic anxiety    Diverticulosis    Dysrhythmia    atrial fib   GERD (gastroesophageal reflux disease)    Hypertension    IBS (irritable bowel syndrome)    Nonischemic cardiomyopathy (HCC)    a. EF 45% by echo 2010;  b. 05/2012 Echo: EF 55-65%, no rwma, Gr 1 DD, mild AI/MR, mildly dil LA.   Paralyzed vocal cords    Paroxysmal atrial fibrillation (HCC)    a. Treated with flecainide   Proteinuria    History of    Surgical History: Past Surgical History:  Procedure Laterality Date    CARDIAC CATHETERIZATION     cataract surgery Bilateral    HIP ARTHROPLASTY Right 03/17/2020   Procedure: ARTHROPLASTY BIPOLAR HIP (HEMIARTHROPLASTY);  Surgeon: Signa Kell, MD;  Location: [facility] ORS;  Service: Orthopedics;  Laterality: Right;   MASS BIOPSY  2013    Home Medications:  Allergies as of 08/24/2023       Reactions   Ciprofloxacin    Diarrhea and stomach pains   Lanoxin [digoxin]    Lopressor [metoprolol Tartrate]    Penicillins    Other reaction(s): Unknown   Sulfonamide Derivatives    Rash   Verapamil    Sulfa Antibiotics Rash        Medication List        Accurate as of August 24, 2023  4:13 PM. If you have any questions, ask your nurse or doctor.          STOP taking these medications    Myrbetriq 25 MG Tb24 tablet Generic drug: mirabegron ER Stopped by: Michiel Cowboy       TAKE these medications    acidophilus Caps capsule Take 1 capsule by mouth daily.   alendronate 70 MG tablet Commonly known as: FOSAMAX Take 70 mg by mouth once a week.   bisacodyl 5 MG EC tablet Commonly known as: DULCOLAX daily as needed.  cefUROXime 500 MG tablet Commonly known as: CEFTIN Take 1 tablet (500 mg total) by mouth 2 (two) times daily with a meal. Started by: Michiel Cowboy   Coenzyme Q10 50 MG Caps Take 50 mg by mouth 2 (two) times daily.   Cranberry 500 MG Caps Take 500 mg by mouth daily.   diltiazem 30 MG tablet Commonly known as: CARDIZEM TAKE 1 TABLET (30 MG) BY MOUTH ONCE DAILY AT BEDTIME   docusate sodium 100 MG capsule Commonly known as: COLACE Take 1 capsule (100 mg total) by mouth 2 (two) times daily.   finasteride 5 MG tablet Commonly known as: PROSCAR TAKE 1 TABLET (5 MG TOTAL) BY MOUTH DAILY.   Fish Oil 1000 MG Caps Take 1,000 mg by mouth daily.   Flax Seed Oil 1000 MG Caps Take 1,000 mg by mouth daily.   flecainide 50 MG tablet Commonly known as: TAMBOCOR Take 2 tablets (100 mg) by mouth twice daily as needed for  recurrent atrial fibrillation   fluticasone 50 MCG/ACT nasal spray Commonly known as: FLONASE Place into the nose as needed.   fluticasone-salmeterol 250-50 MCG/ACT Aepb Commonly known as: ADVAIR Inhale 1 puff into the lungs in the morning and at bedtime.   Gemtesa 75 MG Tabs Generic drug: Vibegron Take 1 tablet (75 mg total) by mouth daily. Started by: Michiel Cowboy   Ginger 500 MG Caps Take 500 mg by mouth 2 (two) times daily.   hydrocortisone 25 MG suppository Commonly known as: ANUSOL-HC Place 25 mg rectally daily as needed for hemorrhoids or anal itching.   magnesium oxide 400 MG tablet Commonly known as: MAG-OX TAKE 1 TABLET BY MOUTH TWICE A DAY   multivitamin tablet Take 1 tablet by mouth daily.   pantoprazole 20 MG tablet Commonly known as: PROTONIX Take 20 mg by mouth daily.   polyethylene glycol 17 g packet Commonly known as: MIRALAX / GLYCOLAX Take 17 g by mouth daily as needed for mild constipation or moderate constipation.   saw palmetto 160 MG capsule Take 160 mg by mouth 2 (two) times daily.   tamsulosin 0.4 MG Caps capsule Commonly known as: FLOMAX TAKE 1 CAPSULE BY MOUTH TWICE A DAY What changed:  how much to take how to take this when to take this additional instructions   Vitamin D3 10 MCG (400 UNIT) Caps Take 800 Units by mouth daily.   warfarin 2.5 MG tablet Commonly known as: COUMADIN Take 1 tablet (2.5 mg total) by mouth every evening. Take 3.5 mg daily total dose on 03/19/2020 and 03/20/2020. Start taking 2.5 mg daily from 03/21/2020.        Allergies:  Allergies  Allergen Reactions   Ciprofloxacin     Diarrhea and stomach pains   Lanoxin [Digoxin]    Lopressor [Metoprolol Tartrate]    Penicillins     Other reaction(s): Unknown   Sulfonamide Derivatives     Rash    Verapamil    Sulfa Antibiotics Rash    Family History: Family History  Problem Relation Age of Onset   Heart attack Neg Hx     Social History:   reports that he has never smoked. He has never used smokeless tobacco. He reports that he does not drink alcohol and does not use drugs.  ROS: Pertinent ROS in HPI  Physical Exam: BP (!) 162/80   Pulse (!) 59   Wt 186 lb (84.4 kg)   BMI 27.47 kg/m   Constitutional:  Well nourished. Alert and oriented, No  acute distress. HEENT: [place] AT, moist mucus membranes.  Trachea midline Cardiovascular: No clubbing, cyanosis, or edema. Respiratory: Normal respiratory effort, no increased work of breathing. GU: No CVA tenderness.  No bladder fullness or masses.  Patient with circumcised phallus.  Urethral meatus is patent.  No penile discharge. No penile lesions or rashes.  Neurologic: Grossly intact, no focal deficits, moving all 4 extremities. Psychiatric: Normal mood and affect.  Laboratory Data: Urinalysis See HPI I have reviewed the labs.   Pertinent Imaging:  08/24/23 15:37  Scan Result 5ml   Assessment & Plan:   1. Penile pain -UA with pyuria, hematuria and bacteruria  -urine culture pending -Explained that since his urine looks like it may have a urinary tract infection, I will go ahead and send for culture and start on the antibiotic  2. OAB -Given samples of Gemtesa 75 mg daily, #42 -return in one month for I PSS/PVR   Return in about 1 month (around 09/23/2023) for UA, I PSS, PVR .  These notes generated with voice recognition software. I apologize for typographical errors.  Cloretta Ned  [facility] [address]  [address] [place], [place] [address] [phone]

## 2023-08-23 ENCOUNTER — Other Ambulatory Visit: Payer: Self-pay | Admitting: Cardiovascular Disease

## 2023-08-24 ENCOUNTER — Encounter: Payer: Self-pay | Admitting: Urology

## 2023-08-24 ENCOUNTER — Ambulatory Visit: Payer: Medicare Other | Admitting: Urology

## 2023-08-24 VITALS — BP 162/80 | HR 59 | Wt 186.0 lb

## 2023-08-24 DIAGNOSIS — N3281 Overactive bladder: Secondary | ICD-10-CM | POA: Diagnosis not present

## 2023-08-24 DIAGNOSIS — N4889 Other specified disorders of penis: Secondary | ICD-10-CM | POA: Diagnosis not present

## 2023-08-24 LAB — MICROSCOPIC EXAMINATION: WBC, UA: >30 /HPF — AB (ref 0–5)

## 2023-08-24 LAB — URINALYSIS, COMPLETE
Bilirubin, UA: NEGATIVE
Glucose, UA: NEGATIVE
Ketones, UA: NEGATIVE
Nitrite, UA: NEGATIVE
Protein,UA: NEGATIVE
Specific Gravity, UA: 1.015 (ref 1.005–1.030)
Urobilinogen, Ur: 0.2 mg/dL (ref 0.2–1.0)
pH, UA: 7 (ref 5.0–7.5)

## 2023-08-24 LAB — BLADDER SCAN AMB NON-IMAGING

## 2023-08-24 MED ORDER — GEMTESA 75 MG PO TABS
75.0000 mg | ORAL_TABLET | Freq: Every day | ORAL | Status: DC
Start: 2023-08-24 — End: 2023-11-16

## 2023-08-24 MED ORDER — CEFUROXIME AXETIL 500 MG PO TABS
500.0000 mg | ORAL_TABLET | Freq: Two times a day (BID) | ORAL | 0 refills | Status: DC
Start: 2023-08-24 — End: 2023-10-06

## 2023-08-24 NOTE — Patient Instructions (Signed)
AZO for dyrsuria

## 2023-08-27 LAB — CULTURE, URINE COMPREHENSIVE

## 2023-09-22 NOTE — Progress Notes (Signed)
09/23/2023 4:36 PM   Robert Burch [birth year] [id number]  Referring provider: Dorothey Baseman, MD [phone] [address] [place],  [place] [address]  Urological history: 1. BPH with LU TS -prostate volume 125 grams -tamsulosin 0.4 mg daily and finasteride 5 mg daily and Myrbetriq 25 mg daily  Chief Complaint  Patient presents with   Follow-up   HPI: Robert Burch is a 89 y.o. male who presents today for follow up.    Previous records reviewed.   At his visit on August 24, 2023, he has been having burning at the end of penis since January 2024.  It comes and goes.  Patient denies any modifying or aggravating factors.  Patient denies any recent UTI's, gross hematuria or suprapubic/flank pain.  Patient denies any fevers, chills, nausea or vomiting.  UA yellow slightly cloudy, specific gravity, 1+ heme, 2+ leuks, >30 WBC's, 3-10 RBC's, 0-10 epithelial cells and moderate bacteria.  PVR 5 mL   He has also been on Myrbetriq and Gemtesa in the past for his OAB and he is not certain which of the medication is more effective.  He would like more samples of the Gemtesa.  Urine culture was positive for Klebsiella pneumonia.  He was treated with culture appropriate antibiotic.  He states he still experiencing burning on the end of his penis, but it has improved.   Patient denies any modifying or aggravating factors.  Patient denies any recent UTI's, gross hematuria, dysuria or suprapubic/flank pain.  Patient denies any fevers, chills, nausea or vomiting.    UA grossly infected.   PVR demonstrates adequate emptying.  PMH: Past Medical History:  Diagnosis Date   Atypical chest pain    a. Nonobstructive, very minor irregs by cath 2007;  b. 06/2009 Ex MV: small area of apical lateral ischemia;  c. 06/2009 Cath: essentially normal;  d. 04/2014 Lexiscan MV: EF 59%, no ischemia/infarct->low risk.   Cancer (HCC)    Chronic anticoagulation    a. Chronic Coumadin   Chronic anxiety     Diverticulosis    Dysrhythmia    atrial fib   GERD (gastroesophageal reflux disease)    Hypertension    IBS (irritable bowel syndrome)    Nonischemic cardiomyopathy (HCC)    a. EF 45% by echo 2010;  b. 05/2012 Echo: EF 55-65%, no rwma, Gr 1 DD, mild AI/MR, mildly dil LA.   Paralyzed vocal cords    Paroxysmal atrial fibrillation (HCC)    a. Treated with flecainide   Proteinuria    History of    Surgical History: Past Surgical History:  Procedure Laterality Date   CARDIAC CATHETERIZATION     cataract surgery Bilateral    HIP ARTHROPLASTY Right 03/17/2020   Procedure: ARTHROPLASTY BIPOLAR HIP (HEMIARTHROPLASTY);  Surgeon: Signa Kell, MD;  Location: [facility] ORS;  Service: Orthopedics;  Laterality: Right;   MASS BIOPSY  2013    Home Medications:  Allergies as of 09/23/2023       Reactions   Ciprofloxacin    Diarrhea and stomach pains   Lanoxin [digoxin]    Lopressor [metoprolol Tartrate]    Penicillins    Other reaction(s): Unknown   Sulfonamide Derivatives    Rash   Verapamil    Sulfa Antibiotics Rash        Medication List        Accurate as of September 23, 2023 11:59 PM. If you have any questions, ask your nurse or doctor.  acidophilus Caps capsule Take 1 capsule by mouth daily.   alendronate 70 MG tablet Commonly known as: FOSAMAX Take 70 mg by mouth once a week.   bisacodyl 5 MG EC tablet Commonly known as: DULCOLAX daily as needed.   cefUROXime 500 MG tablet Commonly known as: CEFTIN Take 1 tablet (500 mg total) by mouth 2 (two) times daily with a meal.   clotrimazole-betamethasone cream Commonly known as: LOTRISONE Apply topically.   Coenzyme Q10 50 MG Caps Take 50 mg by mouth 2 (two) times daily.   Cranberry 500 MG Caps Take 500 mg by mouth daily.   diltiazem 30 MG tablet Commonly known as: CARDIZEM TAKE 1 TABLET (30 MG) BY MOUTH ONCE DAILY AT BEDTIME   docusate sodium 100 MG capsule Commonly known as: COLACE Take 1 capsule (100  mg total) by mouth 2 (two) times daily.   finasteride 5 MG tablet Commonly known as: PROSCAR TAKE 1 TABLET (5 MG TOTAL) BY MOUTH DAILY.   Fish Oil 1000 MG Caps Take 1,000 mg by mouth daily.   Flax Seed Oil 1000 MG Caps Take 1,000 mg by mouth daily.   flecainide 50 MG tablet Commonly known as: TAMBOCOR Take 2 tablets (100 mg) by mouth twice daily as needed for recurrent atrial fibrillation   fluticasone 50 MCG/ACT nasal spray Commonly known as: FLONASE Place into the nose as needed.   fluticasone-salmeterol 250-50 MCG/ACT Aepb Commonly known as: ADVAIR Inhale 1 puff into the lungs in the morning and at bedtime.   Gemtesa 75 MG Tabs Generic drug: Vibegron Take 1 tablet (75 mg total) by mouth daily.   Ginger 500 MG Caps Take 500 mg by mouth 2 (two) times daily.   hydrocortisone 25 MG suppository Commonly known as: ANUSOL-HC Place 25 mg rectally daily as needed for hemorrhoids or anal itching.   magnesium oxide 400 MG tablet Commonly known as: MAG-OX TAKE 1 TABLET BY MOUTH TWICE A DAY   multivitamin tablet Take 1 tablet by mouth daily.   nitrofurantoin (macrocrystal-monohydrate) 100 MG capsule Commonly known as: MACROBID Take 1 capsule (100 mg total) by mouth every 12 (twelve) hours. Started by: Michiel Cowboy   pantoprazole 20 MG tablet Commonly known as: PROTONIX Take 20 mg by mouth daily.   polyethylene glycol 17 g packet Commonly known as: MIRALAX / GLYCOLAX Take 17 g by mouth daily as needed for mild constipation or moderate constipation.   saw palmetto 160 MG capsule Take 160 mg by mouth 2 (two) times daily.   tamsulosin 0.4 MG Caps capsule Commonly known as: FLOMAX TAKE 1 CAPSULE BY MOUTH TWICE A DAY What changed:  how much to take how to take this when to take this additional instructions   Vitamin D3 10 MCG (400 UNIT) Caps Take 800 Units by mouth daily.   warfarin 2.5 MG tablet Commonly known as: COUMADIN Take 1 tablet (2.5 mg total) by  mouth every evening. Take 3.5 mg daily total dose on 03/19/2020 and 03/20/2020. Start taking 2.5 mg daily from 03/21/2020.        Allergies:  Allergies  Allergen Reactions   Ciprofloxacin     Diarrhea and stomach pains   Lanoxin [Digoxin]    Lopressor [Metoprolol Tartrate]    Penicillins     Other reaction(s): Unknown   Sulfonamide Derivatives     Rash    Verapamil    Sulfa Antibiotics Rash    Family History: Family History  Problem Relation Age of Onset   Heart attack Neg  Hx     Social History:  reports that he has never smoked. He has never used smokeless tobacco. He reports that he does not drink alcohol and does not use drugs.  ROS: Pertinent ROS in HPI  Physical Exam:  Constitutional:  Well nourished. Alert and oriented, No acute distress. HEENT: [place] AT, moist mucus membranes.  Trachea midline Cardiovascular: No clubbing, cyanosis, or edema. Respiratory: Normal respiratory effort, no increased work of breathing. Neurologic: Grossly intact, no focal deficits, moving all 4 extremities. Psychiatric: Normal mood and affect.    Laboratory Data: Urinalysis Component     Latest Ref Rng 09/23/2023  Specific Gravity, UA     1.005 - 1.030  1.020   pH, UA     5.0 - 7.5  6.5   Color, UA     Yellow  Yellow   Appearance Ur     Clear  Cloudy !   Leukocytes,UA     Negative  3+ !   Protein,UA     Negative/Trace  Negative   Glucose, UA     Negative  Negative   Ketones, UA     Negative  Negative   RBC, UA     Negative  2+ !   Bilirubin, UA     Negative  Negative   Urobilinogen, Ur     0.2 - 1.0 mg/dL 0.2   Nitrite, UA     Negative  Positive !   Microscopic Examination See below:     Legend: ! Abnormal  Component     Latest Ref Rng 09/23/2023  WBC, UA     0 - 5 /hpf >30 !   Epithelial Cells (non renal)     0 - 10 /hpf 0-10   Bacteria, UA     None seen/Few  Many !   RBC, Urine     0 - 2 /hpf 11-30 !     Legend: ! Abnormal I have reviewed the  labs.   Pertinent Imaging:  09/23/23 15:26  Scan Result 0 ml    Assessment & Plan:   1. rUTI's -UA with pyuria, hematuria and bacteruria  -urine culture pending -Explained that since his urine looks like it may still have a urinary tract infection, I will go ahead and send for culture and start on the antibiotic, Macrobid  -I will also schedule renal ultrasound for further evaluation -Advised him that he can use over-the-counter AZO and that it will turn his urine orange, but he can use it for the pain  2. OAB -PVR demonstrates adequate bladder emptying -continue Gemtesa 75 mg   Return in about 1 month (around 10/24/2023) for RUS report, UA, I PSS .  These notes generated with voice recognition software. I apologize for typographical errors.  Cloretta Ned  [facility] [address]  [address] [place], [place] [address] [phone]

## 2023-09-23 ENCOUNTER — Encounter: Payer: Self-pay | Admitting: Urology

## 2023-09-23 ENCOUNTER — Ambulatory Visit: Payer: Medicare Other | Admitting: Urology

## 2023-09-23 DIAGNOSIS — N3281 Overactive bladder: Secondary | ICD-10-CM | POA: Diagnosis not present

## 2023-09-23 DIAGNOSIS — R319 Hematuria, unspecified: Secondary | ICD-10-CM | POA: Diagnosis not present

## 2023-09-23 DIAGNOSIS — N390 Urinary tract infection, site not specified: Secondary | ICD-10-CM

## 2023-09-23 DIAGNOSIS — N4889 Other specified disorders of penis: Secondary | ICD-10-CM | POA: Diagnosis not present

## 2023-09-23 DIAGNOSIS — R8271 Bacteriuria: Secondary | ICD-10-CM

## 2023-09-23 DIAGNOSIS — Z87440 Personal history of urinary (tract) infections: Secondary | ICD-10-CM

## 2023-09-23 DIAGNOSIS — R8281 Pyuria: Secondary | ICD-10-CM

## 2023-09-23 LAB — URINALYSIS, COMPLETE
Bilirubin, UA: NEGATIVE
Glucose, UA: NEGATIVE
Ketones, UA: NEGATIVE
Nitrite, UA: POSITIVE — AB
Protein,UA: NEGATIVE
Specific Gravity, UA: 1.02 (ref 1.005–1.030)
Urobilinogen, Ur: 0.2 mg/dL (ref 0.2–1.0)
pH, UA: 6.5 (ref 5.0–7.5)

## 2023-09-23 LAB — MICROSCOPIC EXAMINATION: WBC, UA: >30 /[HPF] — AB (ref 0–5)

## 2023-09-23 LAB — BLADDER SCAN AMB NON-IMAGING: Scan Result: 0

## 2023-09-23 MED ORDER — NITROFURANTOIN MONOHYD MACRO 100 MG PO CAPS
100.0000 mg | ORAL_CAPSULE | Freq: Two times a day (BID) | ORAL | 0 refills | Status: DC
Start: 2023-09-23 — End: 2023-09-23

## 2023-09-23 MED ORDER — NITROFURANTOIN MONOHYD MACRO 100 MG PO CAPS
100.0000 mg | ORAL_CAPSULE | Freq: Two times a day (BID) | ORAL | 0 refills | Status: DC
Start: 2023-09-23 — End: 2023-10-06

## 2023-09-27 LAB — CULTURE, URINE COMPREHENSIVE

## 2023-09-30 ENCOUNTER — Ambulatory Visit
Admission: RE | Admit: 2023-09-30 | Discharge: 2023-09-30 | Disposition: A | Discharge status: Home / Self Care | Payer: Medicare Other | Source: Ambulatory Visit | Attending: Urology | Admitting: Urology

## 2023-09-30 DIAGNOSIS — S7291XA Unspecified fracture of right femur, initial encounter for closed fracture: Secondary | ICD-10-CM | POA: Diagnosis not present

## 2023-09-30 DIAGNOSIS — S72111A Displaced fracture of greater trochanter of right femur, initial encounter for closed fracture: Secondary | ICD-10-CM | POA: Diagnosis not present

## 2023-09-30 DIAGNOSIS — N390 Urinary tract infection, site not specified: Secondary | ICD-10-CM | POA: Insufficient documentation

## 2023-10-02 ENCOUNTER — Other Ambulatory Visit: Payer: Self-pay

## 2023-10-02 ENCOUNTER — Emergency Department: Payer: Medicare Other

## 2023-10-02 ENCOUNTER — Inpatient Hospital Stay
Admission: EM | Admit: 2023-10-02 | Discharge: 2023-10-06 | DRG: 536 | Disposition: A | Discharge status: Skilled Nursing Facility | Payer: Medicare Other | Attending: Internal Medicine | Admitting: Internal Medicine

## 2023-10-02 DIAGNOSIS — N401 Enlarged prostate with lower urinary tract symptoms: Secondary | ICD-10-CM | POA: Diagnosis present

## 2023-10-02 DIAGNOSIS — Z882 Allergy status to sulfonamides status: Secondary | ICD-10-CM

## 2023-10-02 DIAGNOSIS — S72301A Unspecified fracture of shaft of right femur, initial encounter for closed fracture: Secondary | ICD-10-CM | POA: Diagnosis not present

## 2023-10-02 DIAGNOSIS — I951 Orthostatic hypotension: Secondary | ICD-10-CM | POA: Diagnosis present

## 2023-10-02 DIAGNOSIS — Z888 Allergy status to other drugs, medicaments and biological substances status: Secondary | ICD-10-CM | POA: Diagnosis not present

## 2023-10-02 DIAGNOSIS — Z881 Allergy status to other antibiotic agents status: Secondary | ICD-10-CM

## 2023-10-02 DIAGNOSIS — I5022 Chronic systolic (congestive) heart failure: Secondary | ICD-10-CM | POA: Diagnosis present

## 2023-10-02 DIAGNOSIS — W07XXXA Fall from chair, initial encounter: Secondary | ICD-10-CM | POA: Diagnosis present

## 2023-10-02 DIAGNOSIS — Z7983 Long term (current) use of bisphosphonates: Secondary | ICD-10-CM | POA: Diagnosis not present

## 2023-10-02 DIAGNOSIS — M810 Age-related osteoporosis without current pathological fracture: Secondary | ICD-10-CM | POA: Diagnosis present

## 2023-10-02 DIAGNOSIS — D72829 Elevated white blood cell count, unspecified: Secondary | ICD-10-CM

## 2023-10-02 DIAGNOSIS — S72001A Fracture of unspecified part of neck of right femur, initial encounter for closed fracture: Principal | ICD-10-CM

## 2023-10-02 DIAGNOSIS — Z7901 Long term (current) use of anticoagulants: Secondary | ICD-10-CM

## 2023-10-02 DIAGNOSIS — Z880 Allergy status to penicillin: Secondary | ICD-10-CM

## 2023-10-02 DIAGNOSIS — N400 Enlarged prostate without lower urinary tract symptoms: Secondary | ICD-10-CM | POA: Diagnosis present

## 2023-10-02 DIAGNOSIS — K219 Gastro-esophageal reflux disease without esophagitis: Secondary | ICD-10-CM | POA: Diagnosis present

## 2023-10-02 DIAGNOSIS — Z79899 Other long term (current) drug therapy: Secondary | ICD-10-CM | POA: Diagnosis not present

## 2023-10-02 DIAGNOSIS — Z7951 Long term (current) use of inhaled steroids: Secondary | ICD-10-CM | POA: Diagnosis not present

## 2023-10-02 DIAGNOSIS — M9701XA Periprosthetic fracture around internal prosthetic right hip joint, initial encounter: Secondary | ICD-10-CM | POA: Diagnosis present

## 2023-10-02 DIAGNOSIS — I110 Hypertensive heart disease with heart failure: Secondary | ICD-10-CM | POA: Diagnosis present

## 2023-10-02 DIAGNOSIS — E860 Dehydration: Secondary | ICD-10-CM | POA: Diagnosis present

## 2023-10-02 DIAGNOSIS — I428 Other cardiomyopathies: Secondary | ICD-10-CM | POA: Diagnosis present

## 2023-10-02 DIAGNOSIS — S72111A Displaced fracture of greater trochanter of right femur, initial encounter for closed fracture: Secondary | ICD-10-CM | POA: Diagnosis present

## 2023-10-02 DIAGNOSIS — I480 Paroxysmal atrial fibrillation: Secondary | ICD-10-CM | POA: Diagnosis present

## 2023-10-02 DIAGNOSIS — M978XXA Periprosthetic fracture around other internal prosthetic joint, initial encounter: Secondary | ICD-10-CM

## 2023-10-02 DIAGNOSIS — S7291XA Unspecified fracture of right femur, initial encounter for closed fracture: Secondary | ICD-10-CM | POA: Diagnosis present

## 2023-10-02 DIAGNOSIS — I10 Essential (primary) hypertension: Secondary | ICD-10-CM | POA: Diagnosis not present

## 2023-10-02 DIAGNOSIS — Z96649 Presence of unspecified artificial hip joint: Secondary | ICD-10-CM | POA: Diagnosis not present

## 2023-10-02 DIAGNOSIS — N138 Other obstructive and reflux uropathy: Secondary | ICD-10-CM | POA: Diagnosis present

## 2023-10-02 LAB — CBC WITH DIFFERENTIAL/PLATELET
Abs Immature Granulocytes: 0.07 10*3/uL (ref 0.00–0.07)
Basophils Absolute: 0.1 10*3/uL (ref 0.0–0.1)
Basophils Relative: 0 %
Eosinophils Absolute: 0 10*3/uL (ref 0.0–0.5)
Eosinophils Relative: 0 %
HCT: 39.2 % (ref 39.0–52.0)
Hemoglobin: 13.1 g/dL (ref 13.0–17.0)
Immature Granulocytes: 1 %
Lymphocytes Relative: 9 %
Lymphs Abs: 1.3 10*3/uL (ref 0.7–4.0)
MCH: 28.1 pg (ref 26.0–34.0)
MCHC: 33.4 g/dL (ref 30.0–36.0)
MCV: 84.1 fL (ref 80.0–100.0)
Monocytes Absolute: 0.9 10*3/uL (ref 0.1–1.0)
Monocytes Relative: 6 %
Neutro Abs: 11.8 10*3/uL — ABNORMAL HIGH (ref 1.7–7.7)
Neutrophils Relative %: 84 %
Platelets: 221 10*3/uL (ref 150–400)
RBC: 4.66 MIL/uL (ref 4.22–5.81)
RDW: 15.6 % — ABNORMAL HIGH (ref 11.5–15.5)
WBC: 14.1 10*3/uL — ABNORMAL HIGH (ref 4.0–10.5)
nRBC: 0 % (ref 0.0–0.2)

## 2023-10-02 LAB — COMPREHENSIVE METABOLIC PANEL
ALT: 13 U/L (ref 0–44)
AST: 20 U/L (ref 15–41)
Albumin: 3.6 g/dL (ref 3.5–5.0)
Alkaline Phosphatase: 51 U/L (ref 38–126)
Anion gap: 6 (ref 5–15)
BUN: 18 mg/dL (ref 8–23)
CO2: 25 mmol/L (ref 22–32)
Calcium: 8.3 mg/dL — ABNORMAL LOW (ref 8.9–10.3)
Chloride: 102 mmol/L (ref 98–111)
Creatinine, Ser: 0.9 mg/dL (ref 0.61–1.24)
GFR, Estimated: >60 mL/min (ref >60)
Glucose, Bld: 108 mg/dL — ABNORMAL HIGH (ref 70–99)
Potassium: 4.4 mmol/L (ref 3.5–5.1)
Sodium: 133 mmol/L — ABNORMAL LOW (ref 135–145)
Total Bilirubin: 0.8 mg/dL (ref 0.3–1.2)
Total Protein: 6.9 g/dL (ref 6.5–8.1)

## 2023-10-02 LAB — PROTIME-INR
INR: 2.5 — ABNORMAL HIGH (ref 0.8–1.2)
Prothrombin Time: 27.2 s — ABNORMAL HIGH (ref 11.4–15.2)

## 2023-10-02 MED ORDER — DILTIAZEM HCL 30 MG PO TABS
30.0000 mg | ORAL_TABLET | Freq: Every day | ORAL | Status: DC
  Administered 2023-10-02 – 2023-10-03 (×2): 30 mg via ORAL
  Filled 2023-10-02 (×2): qty 1

## 2023-10-02 MED ORDER — FLECAINIDE ACETATE 100 MG PO TABS: 100.0000 mg | ORAL_TABLET | Freq: Two times a day (BID) | ORAL | Status: DC | PRN

## 2023-10-02 MED ORDER — TAMSULOSIN HCL 0.4 MG PO CAPS
0.4000 mg | ORAL_CAPSULE | Freq: Every day | ORAL | Status: DC
  Administered 2023-10-03 – 2023-10-04 (×2): 0.4 mg via ORAL
  Filled 2023-10-02 (×2): qty 1

## 2023-10-02 MED ORDER — HYDROMORPHONE HCL 1 MG/ML IJ SOLN: 0.5000 mg | INTRAMUSCULAR | Status: DC | PRN

## 2023-10-02 MED ORDER — WARFARIN - PHARMACIST DOSING INPATIENT
Freq: Every day | Status: DC
  Filled 2023-10-02: qty 1

## 2023-10-02 MED ORDER — MOMETASONE FURO-FORMOTEROL FUM 200-5 MCG/ACT IN AERO
2.0000 | INHALATION_SPRAY | Freq: Two times a day (BID) | RESPIRATORY_TRACT | Status: DC
  Filled 2023-10-02: qty 8.8

## 2023-10-02 MED ORDER — SAW PALMETTO (SERENOA REPENS) 160 MG PO CAPS: 160.0000 mg | ORAL_CAPSULE | Freq: Two times a day (BID) | ORAL | Status: DC

## 2023-10-02 MED ORDER — FINASTERIDE 5 MG PO TABS
5.0000 mg | ORAL_TABLET | Freq: Every day | ORAL | Status: DC
  Administered 2023-10-03 – 2023-10-05 (×3): 5 mg via ORAL
  Filled 2023-10-02 (×3): qty 1

## 2023-10-02 MED ORDER — POLYETHYLENE GLYCOL 3350 17 G PO PACK
17.0000 g | PACK | Freq: Every day | ORAL | Status: DC | PRN
  Administered 2023-10-04 – 2023-10-05 (×2): 17 g via ORAL
  Filled 2023-10-02 (×2): qty 1

## 2023-10-02 MED ORDER — OXYCODONE HCL 5 MG PO TABS
5.0000 mg | ORAL_TABLET | Freq: Once | ORAL | Status: AC
  Administered 2023-10-02: 5 mg via ORAL
  Filled 2023-10-02: qty 1

## 2023-10-02 MED ORDER — MIRABEGRON ER 25 MG PO TB24
25.0000 mg | ORAL_TABLET | Freq: Every day | ORAL | Status: DC
  Administered 2023-10-03 – 2023-10-05 (×3): 25 mg via ORAL
  Filled 2023-10-02 (×4): qty 1

## 2023-10-02 MED ORDER — PANTOPRAZOLE SODIUM 20 MG PO TBEC
20.0000 mg | DELAYED_RELEASE_TABLET | Freq: Every day | ORAL | Status: DC
  Administered 2023-10-03 – 2023-10-05 (×3): 20 mg via ORAL
  Filled 2023-10-02 (×4): qty 1

## 2023-10-02 MED ORDER — ACETAMINOPHEN 325 MG PO TABS
650.0000 mg | ORAL_TABLET | Freq: Four times a day (QID) | ORAL | Status: DC
  Administered 2023-10-02 – 2023-10-06 (×14): 650 mg via ORAL
  Filled 2023-10-02 (×14): qty 2

## 2023-10-02 MED ORDER — HYDROCODONE-ACETAMINOPHEN 5-325 MG PO TABS
1.0000 | ORAL_TABLET | Freq: Four times a day (QID) | ORAL | Status: DC | PRN
  Administered 2023-10-02 – 2023-10-05 (×4): 1 via ORAL
  Filled 2023-10-02 (×4): qty 1

## 2023-10-02 MED ORDER — WARFARIN SODIUM 2.5 MG PO TABS
2.5000 mg | ORAL_TABLET | Freq: Once | ORAL | Status: AC
  Administered 2023-10-02: 2.5 mg via ORAL
  Filled 2023-10-02 (×2): qty 1

## 2023-10-02 NOTE — Assessment & Plan Note (Signed)
On Flomax and finasteride

## 2023-10-02 NOTE — Assessment & Plan Note (Signed)
-   Continue home regimen 

## 2023-10-02 NOTE — Assessment & Plan Note (Signed)
Reactive in nature

## 2023-10-02 NOTE — ED Triage Notes (Signed)
Pt comes via EMs from home with c/o slip and fall.Pt states right hip pain. Pt states he was trying to sit in his chair and it moved so he slid out. Pt denies any loc or hitting his head. Pt is on thinners. Pt was up walking around at home prior to EMs arrival.

## 2023-10-02 NOTE — Consult Note (Signed)
PHARMACY - ANTICOAGULATION CONSULT NOTE  Pharmacy Consult for Warfarin dosing Indication: atrial fibrillation  Allergies  Allergen Reactions   Ciprofloxacin     Diarrhea and stomach pains   Lanoxin [Digoxin]    Lopressor [Metoprolol Tartrate]    Penicillins     Other reaction(s): Unknown   Sulfonamide Derivatives     Rash    Verapamil    Sulfa Antibiotics Rash    Patient Measurements:     Vital Signs: Temp: 98.1 F (36.7 C) (10/12 1608) Temp Source: Oral (10/12 1608) BP: 137/77 (10/12 1608) Pulse Rate: 67 (10/12 1608)  Labs: Recent Labs    10/02/23 1156  HGB 13.1  HCT 39.2  PLT 221  LABPROT 27.2*  INR 2.5*  CREATININE 0.90    CrCl cannot be calculated (Unknown ideal weight.).   Medical History: Past Medical History:  Diagnosis Date   Atypical chest pain    a. Nonobstructive, very minor irregs by cath 2007;  b. 06/2009 Ex MV: small area of apical lateral ischemia;  c. 06/2009 Cath: essentially normal;  d. 04/2014 Lexiscan MV: EF 59%, no ischemia/infarct->low risk.   Cancer (HCC)    Chronic anticoagulation    a. Chronic Coumadin   Chronic anxiety    Diverticulosis    Dysrhythmia    atrial fib   GERD (gastroesophageal reflux disease)    Hypertension    IBS (irritable bowel syndrome)    Nonischemic cardiomyopathy (HCC)    a. EF 45% by echo 2010;  b. 05/2012 Echo: EF 55-65%, no rwma, Gr 1 DD, mild AI/MR, mildly dil LA.   Paralyzed vocal cords    Paroxysmal atrial fibrillation (HCC)    a. Treated with flecainide   Proteinuria    History of    Medications:  Current home Warfarin dose is 2.5 mg po daily, last reported dose 10/01/23.  Assessment: 89 yo male presented to ED due to a fall with closed right femoral fracture.  PMH includes Afib, HTN, HFrEF, GERD, and osteoporosis.  Pharmacy consulted to manage Warfarin dosing.  Goal of Therapy:  INR 2-3 Monitor platelets by anticoagulation protocol: Yes    Date INR Warfarin Dose  10/12 2.5 2.5 mg  (ordered)         Plan:  INR is therapeutic Will order Warfarin 2.5 mg po x 1 per home dose Daily INR while on warfarin CBC at least every 3 days  Barrie Folk, PharmD 10/02/2023,5:04 PM

## 2023-10-02 NOTE — Assessment & Plan Note (Signed)
Flecainide is as needed for palpitations and/or atrial fibrillation.  Pharmacy to dose Coumadin.  Holding Cardizem low-dose at this point.

## 2023-10-02 NOTE — Plan of Care (Signed)

## 2023-10-02 NOTE — Consult Note (Signed)
ORTHOPEDIC CONSULTATION  Robert Burch [id number]  10/02/2023  CC:  Chief Complaint  Patient presents with   Fall    History of Present IlIness: The patient is a 89 y.o. male who suffered an injury to his right hip when he went to sit in a chair and it slid out from under him.  He normally lives at home with his wife, who is present to assist with history as the patient does have some confusion.  Further questioning and review of the medical records reveals the patient underwent a right bipolar hemiarthroplasty for femoral neck fracture on 03/17/2020.  He has seen Dr. Allena Katz and Dr. Joice Lofts in the [facility].  Of significance the patient has a history of atrial fibrillation and is currently on warfarin.  His INR at admission was 2.5.  Admission H&P history for completeness: Robert Burch is a 89 y.o. male with medical history significant of atrial fibrillation on Warfarin and flecainide, non-ischemic cardiomyopathy c/b HFrEF with recovered EF (55-60%), primary hypertension, GERD, osteoporosis, who presents to the ED due to a ground-level fall.   Robert Burch states he was trying to sit down in his chair, when it slipped out from under him and he fell down landing onto his right hip. Afterwards, he experienced significant right upper leg pain with any movement. He denies any symptoms prior, stating he was in his usual state of health. No CP, SOB, palpitations, N/V/D reported.  PMH:  Past Medical History:  Diagnosis Date   Atypical chest pain    a. Nonobstructive, very minor irregs by cath 2007;  b. 06/2009 Ex MV: small area of apical lateral ischemia;  c. 06/2009 Cath: essentially normal;  d. 04/2014 Lexiscan MV: EF 59%, no ischemia/infarct->low risk.   Cancer (HCC)    Chronic anticoagulation    a. Chronic Coumadin   Chronic anxiety    Diverticulosis    Dysrhythmia    atrial fib   GERD (gastroesophageal reflux disease)    Hypertension    IBS (irritable bowel syndrome)     Nonischemic cardiomyopathy (HCC)    a. EF 45% by echo 2010;  b. 05/2012 Echo: EF 55-65%, no rwma, Gr 1 DD, mild AI/MR, mildly dil LA.   Paralyzed vocal cords    Paroxysmal atrial fibrillation (HCC)    a. Treated with flecainide   Proteinuria    History of    SH:  Past Surgical History:  Procedure Laterality Date   CARDIAC CATHETERIZATION     cataract surgery Bilateral    HIP ARTHROPLASTY Right 03/17/2020   Procedure: ARTHROPLASTY BIPOLAR HIP (HEMIARTHROPLASTY);  Surgeon: Signa Kell, MD;  Location: [facility] ORS;  Service: Orthopedics;  Laterality: Right;   MASS BIOPSY  2013    ALL:  Allergies  Allergen Reactions   Ciprofloxacin     Diarrhea and stomach pains   Lanoxin [Digoxin]    Lopressor [Metoprolol Tartrate]    Penicillins     Other reaction(s): Unknown   Sulfonamide Derivatives     Rash    Verapamil    Sulfa Antibiotics Rash    MED: (Not in a hospital admission)   All home medications have been reviewed as documented in the medication reconciliation portion of the patient record.  FH:  Family History  Problem Relation Age of Onset   Heart attack Neg Hx     Social:  reports that he has never smoked. He has never used smokeless tobacco. He reports that he does not drink alcohol and does not  use drugs.  Review of Systems: General: Denies fever, chills, weight loss Eyes: Denies blurry vision, changes in vision ENT: Denies sore throat, congestions, nosebleeds CV: Denies chest pain, palpitations Respiratory: Denies shortness of breath, wheezing, cough Gl: Denies abdominal pain, nausea, vomiting GU: Denies hematuria Integumentary: Denies rashes or lesions Neuro: Denies headache, dizziness Psych: Negative Hem/Onc: Denies easy bruising or bleeding disorders Musculoskeletal: See HPI above.  Vitals: BP (!) 146/96 (BP Location: Right Arm)   Pulse 63   Temp 98.2 F (36.8 C) (Oral)   Resp 18   SpO2 100%    Physical Exam: General: Awake, alert and oriented, no  acute distress. Eyes: Pupils reactive, EOMI, normal conjunctiva, no scleral icterus. HENT: Normocephalic, atraumatic, normal hearing, moist oral mucosa Neck: Supple, non-tender, no cervical lymphadenopathy. Lungs: Chest rise is symmetric, non-labored respiration, chest wall nontender to palpation Heart: Normal rate by palpation, normal peripheral perfusion Abdomen: Soft, non-tender, non-distended. Pelvis is stable. Skin: Skin envelope intact, dry and pink, no rashes or lesions, no signs of infection. Neurologic: Awake, alert, and oriented X3 Psychiatric: Cooperative, appropriate mood and affect.  Musculoskeletal: Evaluation of the patient's bilateral upper extremities and left lower extremity reveal no areas of tenderness or limitations to range of motion. Any motion about the patient's right hip is extremely painful. Palpation of the right distal femur, knee, tib-fib region, ankle and foot show no areas of tenderness. The patient's calves are soft and nontender with no palpable cords. Homans testing is negative. Dorsalis pedis and posterior tibial pulse are 2+ and symmetric. The patient's toes are pink and warm with a brisk capillary refill time. The patient is able to gently move their ankles and toes on command. Sensation is intact over all lower extremity dermatomal patterns.   Radiographic findings: Radiographs of the patient's pelvis and right hip reveal he is status post bipolar hemiarthroplasty with a new periprosthetic fracture involving the base of the greater trochanter that extends part way around the stem.  The distal stem appears to be well-seated.  The bipolar head is well reduced in the acetabulum.  No pelvic fractures are noted.   Labs:  Recent Labs    10/02/23 1156  HGB 13.1   Recent Labs    10/02/23 1156  WBC 14.1*  RBC 4.66  HCT 39.2  PLT 221   Recent Labs    10/02/23 1156  NA 133*  K 4.4  CL 102  CO2 25  BUN 18  CREATININE 0.90  GLUCOSE 108*  CALCIUM 8.3*    Recent Labs    10/02/23 1156  INR 2.5*     Assessment/Plan:    Assessment: 89 year old male with chronic atrial fibrillation on warfarin with right bipolar hemiarthroplasty periprosthetic fracture involving the greater trochanter and extending around the stem proximally.  Implants appear stable at this time.   Plan:  I discussed with the patient and his wife that I would recommend a trial of conservative, nonoperative treatment for his right bipolar hemiarthroplasty periprosthetic fracture of his right hip.  We discussed that he would be admitted to the hospital for medical management and discharge planning.  We discussed that it would be possible for him to return home with home health and home PT if his wife feels like she can care for him.  Otherwise they may need to consider a short course of inpatient rehabilitation in a skilled nursing facility.  As the patient is currently under the care of Dr. Joice Lofts, I discussed the case with him by telephone and  we are in agreement for trial of conservative nonoperative treatment.  The patient is currently on warfarin and at this stage can continue on that for his atrial fibrillation and DVT prevention.  Will get physical therapy to work on gait training and transfers.  I will follow along with the patient.  Cecil Cranker M.D. 10/02/2023 3:00 PM

## 2023-10-02 NOTE — Consult Note (Signed)
PHARMACIST - PHYSICIAN ORDER COMMUNICATION  CONCERNING: P&T Medication Policy on Herbal Medications  DESCRIPTION:  This patient's order for:  saw palmetto  has been noted.  This product(s) is classified as an "herbal" or natural product. Due to a lack of definitive safety studies or FDA approval, nonstandard manufacturing practices, plus the potential risk of unknown drug-drug interactions while on inpatient medications, the Pharmacy and Therapeutics Committee does not permit the use of "herbal" or natural products of this type within [facility].   ACTION TAKEN: The pharmacy department is unable to verify this order at this time and your patient has been informed of this safety policy. Please reevaluate patient's clinical condition at discharge and address if the herbal or natural product(s) should be resumed at that time.

## 2023-10-02 NOTE — ED Provider Notes (Signed)
[facility] Provider Note    None    (approximate)   History   Fall   HPI  Robert Burch is a 89 y.o. male   presents to the ED via EMS from home with complaint of a slip and fall.  Patient states he was trying to sit in his chair and it moved causing him to slide out of it.  He states that he hit his right hip lateral aspect.  He denies any head injury or loss of consciousness.  Patient is currently taking Coumadin, has history of A-fib, hypertension, atypical chest pain, GERD, sinus bradycardia, PACs, PVCs and right hip fracture with replacement 3 years ago.      Physical Exam   Triage Vital Signs: ED Triage Vitals  Encounter Vitals Group     BP      Systolic BP Percentile      Diastolic BP Percentile      Pulse      Resp      Temp      Temp src      SpO2      Weight      Height      Head Circumference      Peak Flow      Pain Score      Pain Loc      Pain Education      Exclude from Growth Chart     Most recent vital signs: Vitals:   10/02/23 1050  BP: (!) 146/96  Pulse: 63  Resp: 18  Temp: 98.2 F (36.8 C)  SpO2: 100%     General: Awake, no distress.  Alert, talkative, answers questions appropriately. CV:  Good peripheral perfusion.  Right regular rate and rhythm. Resp:  Normal effort.  Lungs are clear bilaterally. Abd:  No distention.  Soft, nontender, bowel sounds present x 4 quadrants. Other:  Moderate tenderness on palpation of the right hip laterally.  Unable to tolerate movement of the right lower extremity secondary to increased pain.  No rotation or shortening of the extremity is noted.  Pulses present distally.   ED Results / Procedures / Treatments   Labs (all labs ordered are listed, but only abnormal results are displayed) Labs Reviewed  CBC WITH DIFFERENTIAL/PLATELET - Abnormal; Notable for the following components:      Result Value   WBC 14.1 (*)    RDW 15.6 (*)    Neutro Abs 11.8 (*)    All  other components within normal limits  COMPREHENSIVE METABOLIC PANEL - Abnormal; Notable for the following components:   Sodium 133 (*)    Glucose, Bld 108 (*)    Calcium 8.3 (*)    All other components within normal limits  PROTIME-INR - Abnormal; Notable for the following components:   Prothrombin Time 27.2 (*)    INR 2.5 (*)    All other components within normal limits     EKG     RADIOLOGY  Right hip x-ray images were reviewed by myself independent of the radiologist and radiology official report shows minimal displaced fracture of the right femoral shaft.   PROCEDURES:  Critical Care performed:   Procedures   MEDICATIONS ORDERED IN ED: Medications  HYDROcodone-acetaminophen (NORCO/VICODIN) 5-325 MG per tablet 1 tablet (has no administration in time range)  acetaminophen (TYLENOL) tablet 650 mg (650 mg Oral Given 10/02/23 1424)  HYDROmorphone (DILAUDID) injection 0.5 mg (has no administration in time range)  polyethylene glycol (MIRALAX /  GLYCOLAX) packet 17 g (has no administration in time range)  oxyCODONE (Oxy IR/ROXICODONE) immediate release tablet 5 mg (5 mg Oral Given 10/02/23 1200)     IMPRESSION / MDM / ASSESSMENT AND PLAN / ED COURSE  I reviewed the triage vital signs and the nursing notes.   Differential diagnosis includes, but is not limited to, contusion  right hip, fracture, dislocation secondary to injury.  89 year old male presents to the ED with complaint of right hip pain after he was going to sit in his chair and landed on his right hip due to the chair moving.  Patient denied any head injury or loss of consciousness.  He has continued to have hip pain since this happened.  X-rays does show a minimally displaced closed fracture right hip.  Patient was made aware.  Arrangements are being made for patient to be admitted.  Hospitalist has been notified.  Dr. Huel Cote will be seeing the patient in the ED.        Patient's presentation is most  consistent with acute complicated illness / injury requiring diagnostic workup.  FINAL CLINICAL IMPRESSION(S) / ED DIAGNOSES   Final diagnoses:  Closed fracture of proximal end of right femur, initial encounter (HCC)     Rx / DC Orders   ED Discharge Orders     None        Note:  This document was prepared using Dragon voice recognition software and may include unintentional dictation errors.   Robert Rumps, PA-C 10/02/23 1450    Robert Cheek, MD 10/03/23 [phone]

## 2023-10-02 NOTE — H&P (Signed)
History and Physical    Patient: Robert Burch ZDG:[id number] DOB: 12/12/1933 DOA: 10/02/2023 DOS: the patient was seen and examined on 10/02/2023 PCP: Dorothey Baseman, MD  Patient coming from: Home  Chief Complaint:  Chief Complaint  Patient presents with   Fall   HPI: Robert Burch is a 89 y.o. male with medical history significant of atrial fibrillation on Warfarin and flecainide, non-ischemic cardiomyopathy c/b HFrEF with recovered EF (55-60%), primary hypertension, GERD, osteoporosis, who presents to the ED due to a ground-level fall.  [name] states he was trying to sit down in his chair, when it slipped out from under him and he fell down landing onto his right hip. Afterwards, he experienced significant right upper leg pain with any movement. He denies any symptoms prior, stating he was in his usual state of health. No CP, SOB, palpitations, N/V/D reported.   ED course: On arrival to the ED, patient was hypertensive at 146/96 with heart rate of 63.  She was saturating at 100% on room air.  He was afebrile 98.2. Initial workup notable for WBC of 14.1, sodium of 133, glucose 108, GFR above 60.  INR 2.5.  Hip x-ray was obtained that demonstrated minimally displaced fracture of the proximal right femoral shaft.  Orthopedic surgery was consulted.  [facility] contacted for admission.  Review of Systems: As mentioned in the history of present illness. All other systems reviewed and are negative.  Past Medical History:  Diagnosis Date   Atypical chest pain    a. Nonobstructive, very minor irregs by cath 2007;  b. 06/2009 Ex MV: small area of apical lateral ischemia;  c. 06/2009 Cath: essentially normal;  d. 04/2014 Lexiscan MV: EF 59%, no ischemia/infarct->low risk.   Cancer (HCC)    Chronic anticoagulation    a. Chronic Coumadin   Chronic anxiety    Diverticulosis    Dysrhythmia    atrial fib   GERD (gastroesophageal reflux disease)    Hypertension    IBS (irritable  bowel syndrome)    Nonischemic cardiomyopathy (HCC)    a. EF 45% by echo 2010;  b. 05/2012 Echo: EF 55-65%, no rwma, Gr 1 DD, mild AI/MR, mildly dil LA.   Paralyzed vocal cords    Paroxysmal atrial fibrillation (HCC)    a. Treated with flecainide   Proteinuria    History of   Past Surgical History:  Procedure Laterality Date   CARDIAC CATHETERIZATION     cataract surgery Bilateral    HIP ARTHROPLASTY Right 03/17/2020   Procedure: ARTHROPLASTY BIPOLAR HIP (HEMIARTHROPLASTY);  Surgeon: Signa Kell, MD;  Location: [facility] ORS;  Service: Orthopedics;  Laterality: Right;   MASS BIOPSY  2013   Social History:  reports that he has never smoked. He has never used smokeless tobacco. He reports that he does not drink alcohol and does not use drugs.  Allergies  Allergen Reactions   Ciprofloxacin     Diarrhea and stomach pains   Lanoxin [Digoxin]    Lopressor [Metoprolol Tartrate]    Penicillins     Other reaction(s): Unknown   Sulfonamide Derivatives     Rash    Verapamil    Sulfa Antibiotics Rash    Family History  Problem Relation Age of Onset   Heart attack Neg Hx     Prior to Admission medications   Medication Sig Start Date End Date Taking? Authorizing Provider  acidophilus (RISAQUAD) CAPS capsule Take 1 capsule by mouth daily.    [provider]  alendronate (  FOSAMAX) 70 MG tablet Take 70 mg by mouth once a week. 08/18/22   [provider]  bisacodyl (DULCOLAX) 5 MG EC tablet daily as needed. 06/26/20   [provider]  cefUROXime (CEFTIN) 500 MG tablet Take 1 tablet (500 mg total) by mouth 2 (two) times daily with a meal. 08/24/23   McGowan, Carollee Herter A, PA-C  Cholecalciferol (VITAMIN D3) 400 UNITS CAPS Take 800 Units by mouth daily.     [provider]  clotrimazole-betamethasone (LOTRISONE) cream Apply topically. 09/20/23   [provider]  Coenzyme Q10 50 MG CAPS Take 50 mg by mouth 2 (two) times daily.     [provider]   Cranberry 500 MG CAPS Take 500 mg by mouth daily.    [provider]  diltiazem (CARDIZEM) 30 MG tablet TAKE 1 TABLET (30 MG) BY MOUTH ONCE DAILY AT BEDTIME 12/18/22   Duke Salvia, MD  docusate sodium (COLACE) 100 MG capsule Take 1 capsule (100 mg total) by mouth 2 (two) times daily. 03/19/20   Rolly Salter, MD  finasteride (PROSCAR) 5 MG tablet TAKE 1 TABLET (5 MG TOTAL) BY MOUTH DAILY. 04/21/23   Sondra Come, MD  Flaxseed, Linseed, (FLAX SEED OIL) 1000 MG CAPS Take 1,000 mg by mouth daily.     [provider]  flecainide (TAMBOCOR) 50 MG tablet Take 2 tablets (100 mg) by mouth twice daily as needed for recurrent atrial fibrillation 08/16/23   Antonieta Iba, MD  fluticasone (FLONASE) 50 MCG/ACT nasal spray Place into the nose as needed. 07/26/20   [provider]  fluticasone-salmeterol (ADVAIR) 250-50 MCG/ACT AEPB Inhale 1 puff into the lungs in the morning and at bedtime. 12/18/22 12/18/23  [provider]  Ginger 500 MG CAPS Take 500 mg by mouth 2 (two) times daily.    [provider]  hydrocortisone (ANUSOL-HC) 25 MG suppository Place 25 mg rectally daily as needed for hemorrhoids or anal itching.    [provider]  magnesium oxide (MAG-OX) 400 MG tablet TAKE 1 TABLET BY MOUTH TWICE A DAY 08/24/23   Antonieta Iba, MD  Multiple Vitamin (MULTIVITAMIN) tablet Take 1 tablet by mouth daily.      [provider]  nitrofurantoin, macrocrystal-monohydrate, (MACROBID) 100 MG capsule Take 1 capsule (100 mg total) by mouth every 12 (twelve) hours. 09/23/23   Michiel Cowboy A, PA-C  Omega-3 Fatty Acids (FISH OIL) 1000 MG CAPS Take 1,000 mg by mouth daily.     [provider]  pantoprazole (PROTONIX) 20 MG tablet Take 20 mg by mouth daily.     [provider]  polyethylene glycol (MIRALAX / GLYCOLAX) packet Take 17 g by mouth daily as needed for mild constipation or moderate constipation.    [provider]  saw palmetto 160 MG capsule Take 160 mg by mouth 2 (two) times daily.    [provider]  tamsulosin (FLOMAX) 0.4 MG CAPS capsule TAKE 1 CAPSULE BY MOUTH TWICE A DAY Patient taking differently: Take 0.4 mg by mouth daily. TAKE 1 CAPSULE BY MOUTH at noon 12/10/22   Sondra Come, MD  Vibegron (GEMTESA) 75 MG TABS Take 1 tablet (75 mg total) by mouth daily. 08/24/23   Michiel Cowboy A, PA-C  warfarin (COUMADIN) 2.5 MG tablet Take 1 tablet (2.5 mg total) by mouth every evening. Take 3.5 mg daily total dose on 03/19/2020 and 03/20/2020. Start taking 2.5 mg daily from 03/21/2020. 03/19/20   Rolly Salter, MD  Physical Exam: Vitals:   10/02/23 1050  BP: (!) 146/96  Pulse: 63  Resp: 18  Temp: 98.2 F (36.8 C)  TempSrc: Oral  SpO2: 100%   Physical Exam Vitals and nursing note reviewed.  Constitutional:      General: He is not in acute distress.    Appearance: He is normal weight. He is not toxic-appearing.  HENT:     Head: Normocephalic and atraumatic.  Eyes:     Conjunctiva/sclera: Conjunctivae normal.     Pupils: Pupils are equal, round, and reactive to light.  Cardiovascular:     Rate and Rhythm: Normal rate. Rhythm irregular.     Heart sounds: No murmur heard. Pulmonary:     Effort: Pulmonary effort is normal. No respiratory distress.     Breath sounds: Normal breath sounds. No wheezing, rhonchi or rales.  Musculoskeletal:     Right lower leg: No edema.     Left lower leg: No edema.     Comments: Pain with palpation to the lateral aspect of the right upper leg  Skin:    General: Skin is warm and dry.  Neurological:     Mental Status: He is alert.     Comments: At baseline, seems forgetful but oriented x4.   Psychiatric:        Mood and Affect: Mood normal.        Behavior: Behavior normal.    Data Reviewed: CBC with WBC of 14.1, hemoglobin of 13.1, and platelets of 221 CMP with sodium of 133, potassium 4.4, bicarb 25, glucose 108, BUN 18, creatinine  0.90, AST 20, ALT 13 and GFR above 60 INR 2.5  DG Hip Unilat W or Wo Pelvis 2-3 Views Right  Result Date: 10/02/2023 CLINICAL DATA:  Pain post fall EXAM: DG HIP (WITH OR WITHOUT PELVIS) 2-3V RIGHT COMPARISON:  03/16/2020 FINDINGS: Right hemiarthroplasty projects in expected location. There is a new fracture of the proximal femoral shaft adjacent to the component stem , displaced up to 3 mm. Mild left hip DJD. Lower lumbar spondylitic changes stable. IMPRESSION: Minimally displaced fracture of the proximal right femoral shaft adjacent to the hemiarthroplasty stem. Electronically Signed   By: Corlis Leak M.D.   On: 10/02/2023 11:23    There are no new results to review at this time.  Assessment and Plan:  * Closed right femoral fracture [facility]) Patient is presenting after a ground-level fall due to his chair slipping behind him complicated by right femoral fracture below previous hemiarthroplasty.  - Orthopedic surgery consulted; appreciate their recommendations - Continuous pulse oximetry while receiving opioid therapy - Tylenol, Norco and Dilaudid for pain control - Zofran as needed for nausea  Paroxysmal atrial fibrillation (HCC) - Continue home flecainide - Warfarin per pharmacy dosing; will need to clarify with orthopedic surgery regarding surgical plans  Leukocytosis Likely reactive in the setting of a hip fracture.  No indicators of infection at this time.  - Repeat CBC in the a.m.  Benign localized hyperplasia of prostate with urinary obstruction - Continue home regimen  Essential hypertension - Continue home regimen  Advance Care Planning:   Code Status: Full Code verified by patient  Consults: Orthopedic surgery  Family Communication: No family at bedside.   Severity of Illness: The appropriate patient status for this patient is INPATIENT. Inpatient status is judged to be reasonable and necessary in order to provide the required intensity of service to ensure the  patient's safety. The patient's presenting symptoms, physical exam findings, and initial  radiographic and laboratory data in the context of their chronic comorbidities is felt to place them at high risk for further clinical deterioration. Furthermore, it is not anticipated that the patient will be medically stable for discharge from the hospital within 2 midnights of admission.   * I certify that at the point of admission it is my clinical judgment that the patient will require inpatient hospital care spanning beyond 2 midnights from the point of admission due to high intensity of service, high risk for further deterioration and high frequency of surveillance required.*  Author: Verdene Lennert, MD 10/02/2023 2:11 PM  For on call review [email or web address].

## 2023-10-02 NOTE — Assessment & Plan Note (Deleted)
Patient is presenting after a ground-level fall due to his chair slipping behind him complicated by right femoral fracture below previous hemiarthroplasty.  - Orthopedic surgery consulted; appreciate their recommendations - Continuous pulse oximetry while receiving opioid therapy - Tylenol, Norco and Dilaudid for pain control - Zofran as needed for nausea

## 2023-10-03 DIAGNOSIS — I480 Paroxysmal atrial fibrillation: Secondary | ICD-10-CM

## 2023-10-03 DIAGNOSIS — M978XXA Periprosthetic fracture around other internal prosthetic joint, initial encounter: Secondary | ICD-10-CM

## 2023-10-03 DIAGNOSIS — I10 Essential (primary) hypertension: Secondary | ICD-10-CM

## 2023-10-03 DIAGNOSIS — N401 Enlarged prostate with lower urinary tract symptoms: Secondary | ICD-10-CM | POA: Diagnosis not present

## 2023-10-03 DIAGNOSIS — D72829 Elevated white blood cell count, unspecified: Secondary | ICD-10-CM

## 2023-10-03 DIAGNOSIS — Z96649 Presence of unspecified artificial hip joint: Secondary | ICD-10-CM

## 2023-10-03 DIAGNOSIS — N138 Other obstructive and reflux uropathy: Secondary | ICD-10-CM

## 2023-10-03 LAB — CBC
HCT: 34.3 % — ABNORMAL LOW (ref 39.0–52.0)
Hemoglobin: 11.3 g/dL — ABNORMAL LOW (ref 13.0–17.0)
MCH: 28.1 pg (ref 26.0–34.0)
MCHC: 32.9 g/dL (ref 30.0–36.0)
MCV: 85.3 fL (ref 80.0–100.0)
Platelets: 191 10*3/uL (ref 150–400)
RBC: 4.02 MIL/uL — ABNORMAL LOW (ref 4.22–5.81)
RDW: 15.5 % (ref 11.5–15.5)
WBC: 8.2 10*3/uL (ref 4.0–10.5)
nRBC: 0 % (ref 0.0–0.2)

## 2023-10-03 LAB — PROTIME-INR
INR: 3.3 — ABNORMAL HIGH (ref 0.8–1.2)
Prothrombin Time: 33.6 s — ABNORMAL HIGH (ref 11.4–15.2)

## 2023-10-03 NOTE — [place] FL2 (Signed)
[place] MEDICAID FL2 LEVEL OF CARE FORM     IDENTIFICATION  Patient Name: Robert Burch Birthdate: 10-Jan-1933 Sex: male Admission Date (Current Location): 10/02/2023  [facility] and [place] Number:  Chiropodist and Address:  [facility], [address], [place], [place] [address]      Provider Number: [id number]  Attending Physician Name and Address:  Alford Highland, MD  Relative Name and Phone Number:  Rudra, Miner (Spouse)  [phone]    Current Level of Care: Hospital Recommended Level of Care: Skilled Nursing Facility Prior Approval Number:    Date Approved/Denied:   PASRR Number: [id number]  Discharge Plan: SNF    Current Diagnoses: Patient Active Problem List   Diagnosis Date Noted   Leukocytosis 10/02/2023   Age related osteoporosis 07/16/2020   Closed right femoral fracture (HCC) 03/16/2020   PAC (premature atrial contraction) 07/06/2019   PVC (premature ventricular contraction) 07/06/2019   Anemia 10/12/2018   Sinus bradycardia 09/23/2018   Coronary artery calcification seen on CAT scan 11/06/2017   Diverticulum of bladder 11/26/2015   Gross hematuria 10/29/2015   Hyperlipidemia 04/22/2015   Cervical radiculitis 05/22/2014   DDD (degenerative disc disease), lumbar 05/22/2014   Lumbar stenosis with neurogenic claudication 05/22/2014   Paroxysmal atrial fibrillation (HCC)    Atypical chest pain    GERD (gastroesophageal reflux disease)    Elevated prostate specific antigen (PSA) 12/06/2013   Anxiety 10/17/2013   Benign localized hyperplasia of prostate with urinary obstruction 08/31/2013   Chronic prostatitis 08/31/2013   ED (erectile dysfunction) of organic origin 08/31/2013   Incomplete emptying of bladder 08/31/2013   Syncope and collapse 12/07/2011   Snoring 07/02/2011   Abnormal respiratory rate 07/02/2011   Essential hypertension 07/22/2009    Orientation RESPIRATION BLADDER Height & Weight      Self, Time, Situation, Place  Normal Continent Weight:   Height:     BEHAVIORAL SYMPTOMS/MOOD NEUROLOGICAL BOWEL NUTRITION STATUS      Continent Diet  AMBULATORY STATUS COMMUNICATION OF NEEDS Skin   Limited Assist Verbally Normal                       Personal Care Assistance Level of Assistance  Bathing, Dressing Bathing Assistance: Limited assistance   Dressing Assistance: Limited assistance     Functional Limitations Info             SPECIAL CARE FACTORS FREQUENCY  PT (By licensed PT), OT (By licensed OT)     PT Frequency: 5 times a week OT Frequency: 5 times a week            Contractures Contractures Info: Not present    Additional Factors Info  Code Status, Allergies Code Status Info: Full Allergies Info: Ciprofloxacin Not Specified   Diarrhea and stomach pains  Lanoxin (digoxin) Not Specified     Lopressor (metoprolol Tartrate) Not Specified     Penicillins Not Specified   Other reaction(s): Unknown  Sulfonamide Derivatives Not Specified   Rash  Verapamil Not Specified     Sulfa Antibiotics           Current Medications (10/03/2023):  This is the current hospital active medication list Current Facility-Administered Medications  Medication Dose Route Frequency Provider Last Rate Last Admin   acetaminophen (TYLENOL) tablet 650 mg  650 mg Oral Q6H WA Verdene Lennert, MD   650 mg at 10/03/23 0934   diltiazem (CARDIZEM) tablet 30 mg  30 mg Oral QHS Verdene Lennert,  MD   30 mg at 10/02/23 2129   finasteride (PROSCAR) tablet 5 mg  5 mg Oral Daily Verdene Lennert, MD   5 mg at 10/03/23 0934   flecainide (TAMBOCOR) tablet 100 mg  100 mg Oral Q12H PRN Verdene Lennert, MD       HYDROcodone-acetaminophen (NORCO/VICODIN) 5-325 MG per tablet 1 tablet  1 tablet Oral Q6H PRN Verdene Lennert, MD   1 tablet at 10/03/23 0558   HYDROmorphone (DILAUDID) injection 0.5 mg  0.5 mg Intravenous Q3H PRN Verdene Lennert, MD       mirabegron ER (MYRBETRIQ) tablet 25 mg  25 mg  Oral Daily Verdene Lennert, MD   25 mg at 10/03/23 0934   mometasone-formoterol (DULERA) 200-5 MCG/ACT inhaler 2 puff  2 puff Inhalation BID Verdene Lennert, MD       pantoprazole (PROTONIX) EC tablet 20 mg  20 mg Oral Daily Verdene Lennert, MD   20 mg at 10/03/23 0934   polyethylene glycol (MIRALAX / GLYCOLAX) packet 17 g  17 g Oral Daily PRN Verdene Lennert, MD       tamsulosin (FLOMAX) capsule 0.4 mg  0.4 mg Oral Q1200 Verdene Lennert, MD       Warfarin - Pharmacist Dosing Inpatient   Does not apply q1600 Barrie Folk, [facility]         Discharge Medications: Please see discharge summary for a list of discharge medications.  Relevant Imaging Results:  Relevant Lab Results:   Additional Information SSN = [id number]  Kemper Durie, RN

## 2023-10-03 NOTE — Consult Note (Signed)
PHARMACY - ANTICOAGULATION CONSULT NOTE  Pharmacy Consult for Warfarin dosing Indication: atrial fibrillation  Allergies  Allergen Reactions   Ciprofloxacin     Diarrhea and stomach pains   Lanoxin [Digoxin]    Lopressor [Metoprolol Tartrate]    Penicillins     Other reaction(s): Unknown   Sulfonamide Derivatives     Rash    Verapamil    Sulfa Antibiotics Rash    Patient Measurements:     Vital Signs: Temp: 98.1 F (36.7 C) (10/13 0753) Temp Source: Oral (10/13 0753) BP: 133/81 (10/13 0753) Pulse Rate: 56 (10/13 0753)  Labs: Recent Labs    10/02/23 1156 10/03/23 0526  HGB 13.1 11.3*  HCT 39.2 34.3*  PLT 221 191  LABPROT 27.2* 33.6*  INR 2.5* 3.3*  CREATININE 0.90  --     CrCl cannot be calculated (Unknown ideal weight.).   Medical History: Past Medical History:  Diagnosis Date   Atypical chest pain    a. Nonobstructive, very minor irregs by cath 2007;  b. 06/2009 Ex MV: small area of apical lateral ischemia;  c. 06/2009 Cath: essentially normal;  d. 04/2014 Lexiscan MV: EF 59%, no ischemia/infarct->low risk.   Cancer (HCC)    Chronic anticoagulation    a. Chronic Coumadin   Chronic anxiety    Diverticulosis    Dysrhythmia    atrial fib   GERD (gastroesophageal reflux disease)    Hypertension    IBS (irritable bowel syndrome)    Nonischemic cardiomyopathy (HCC)    a. EF 45% by echo 2010;  b. 05/2012 Echo: EF 55-65%, no rwma, Gr 1 DD, mild AI/MR, mildly dil LA.   Paralyzed vocal cords    Paroxysmal atrial fibrillation (HCC)    a. Treated with flecainide   Proteinuria    History of    Medications:  Current home Warfarin dose is 2.5 mg po daily, last reported dose 10/01/23.  Assessment: 89 yo male presented to ED due to a fall with closed right femoral fracture.  PMH includes Afib, HTN, HFrEF, GERD, and osteoporosis.  Pharmacy consulted to manage Warfarin dosing.  Goal of Therapy:  INR 2-3 Monitor platelets by anticoagulation protocol: Yes     Date INR Warfarin Dose  10/12 2.5 2.5 mg (ordered)  10/13 3.3      Plan:  INR is SUPRAtherapeutic Will hold dose for today Daily INR while on warfarin CBC at least every 3 days  Bettey Costa, PharmD 10/03/2023,11:25 AM

## 2023-10-03 NOTE — Evaluation (Signed)
Physical Therapy Evaluation Patient Details Name: Robert Burch MRN: [id number] DOB: 1933-04-03 Today's Date: 10/03/2023  History of Present Illness  Robert Burch is a 89 y.o. male with medical history significant of atrial fibrillation on Warfarin and flecainide, non-ischemic cardiomyopathy c/b HFrEF with recovered EF (55-60%), primary hypertension, GERD, osteoporosis, who presents to the ED due to a ground-level fall. Imaging reveals right bipolar hemiarthroplasty periprosthetic fracture involving the greater trochanter and extending around the stem proximally.  Implants appear stable at this time. non surgical management at this time. PWB 25%  Clinical Impression  Patient received in bed, he is pleasant mild confusion, hoh. He is agreeable to PT/OT assessment. Patient requires mod A +2 for all mobility at this time. He is limited by pain and feeling dizzy with mobility this session. Patient left in recliner and set up with breakfast. Nursing and wife in room. He will continue to benefit from skilled PT to improve safety, strength and independence.             If plan is discharge home, recommend the following: Two people to help with walking and/or transfers;A lot of help with bathing/dressing/bathroom;Assist for transportation   Can travel by private vehicle   No    Equipment Recommendations None recommended by PT  Recommendations for Other Services       Functional Status Assessment Patient has had a recent decline in their functional status and demonstrates the ability to make significant improvements in function in a reasonable and predictable amount of time.     Precautions / Restrictions Precautions Precautions: Fall Restrictions Weight Bearing Restrictions: Yes RLE Weight Bearing: Partial weight bearing RLE Partial Weight Bearing Percentage or Pounds: 25      Mobility  Bed Mobility Overal bed mobility: Needs Assistance Bed Mobility: Supine to Sit      Supine to sit: Mod assist, +2 for physical assistance, HOB elevated, Used rails          Transfers Overall transfer level: Needs assistance Equipment used: Rolling walker (2 wheels) Transfers: Sit to/from Stand, Bed to chair/wheelchair/BSC Sit to Stand: Mod assist, +2 physical assistance, From elevated surface   Step pivot transfers: +2 physical assistance, Mod assist            Ambulation/Gait               General Gait Details: unable at this time  Stairs            Wheelchair Mobility     Tilt Bed    Modified Rankin (Stroke Patients Only)       Balance Overall balance assessment: Needs assistance Sitting-balance support: Feet supported Sitting balance-Leahy Scale: Fair Sitting balance - Comments: feeling a bit dizzy upon sitting   Standing balance support: Bilateral upper extremity supported, During functional activity, Reliant on assistive device for balance Standing balance-Leahy Scale: Fair                               Pertinent Vitals/Pain Pain Assessment Pain Assessment: 0-10 Pain Score: 8  Pain Location: R LE Pain Descriptors / Indicators: Discomfort, Sore Pain Intervention(s): Monitored during session, Repositioned    Home Living Family/patient expects to be discharged to:: Skilled nursing facility Living Arrangements: Spouse/significant other Available Help at Discharge: Family Type of Home: House Home Access: Stairs to enter Entrance Stairs-Rails: Right Entrance Stairs-Number of Steps: 3   Home Layout: One level Home Equipment: Agricultural consultant (2 wheels);Rollator (  4 wheels);Wheelchair - Building surveyor;Shower seat      Prior Function Prior Level of Function : Independent/Modified Independent             Mobility Comments: not using AD prior to admission, does not drive ADLs Comments: independent     Extremity/Trunk Assessment   Upper Extremity Assessment Upper Extremity Assessment: Defer to  OT evaluation    Lower Extremity Assessment Lower Extremity Assessment: RLE deficits/detail RLE: Unable to fully assess due to pain RLE Coordination: decreased gross motor    Cervical / Trunk Assessment Cervical / Trunk Assessment: Normal  Communication   Communication Communication: Hearing impairment Cueing Techniques: Verbal cues  Cognition Arousal: Alert Behavior During Therapy: WFL for tasks assessed/performed Overall Cognitive Status: History of cognitive impairments - at baseline                                          General Comments      Exercises     Assessment/Plan    PT Assessment Patient needs continued PT services  PT Problem List Decreased strength;Decreased range of motion;Decreased activity tolerance;Decreased balance;Decreased mobility;Decreased cognition;Decreased safety awareness;Decreased knowledge of precautions;Pain       PT Treatment Interventions DME instruction;Gait training;Balance training;Stair training;Functional mobility training;Therapeutic activities;Therapeutic exercise;Patient/family education;Neuromuscular re-education;Wheelchair mobility training    PT Goals (Current goals can be found in the Care Plan section)  Acute Rehab PT Goals Patient Stated Goal: to improve, patient and wife open to SNF if needed PT Goal Formulation: With patient/family Time For Goal Achievement: 10/17/23 Potential to Achieve Goals: Good    Frequency Min 1X/week     Co-evaluation               AM-PAC PT "6 Clicks" Mobility  Outcome Measure Help needed turning from your back to your side while in a flat bed without using bedrails?: A Lot Help needed moving from lying on your back to sitting on the side of a flat bed without using bedrails?: A Lot Help needed moving to and from a bed to a chair (including a wheelchair)?: A Lot Help needed standing up from a chair using your arms (e.g., wheelchair or bedside chair)?: A Lot Help  needed to walk in hospital room?: Total Help needed climbing 3-5 steps with a railing? : Total 6 Click Score: 10    End of Session Equipment Utilized During Treatment: Gait belt Activity Tolerance: Patient limited by pain;Other (comment) (dizziness) Patient left: in chair;with call bell/phone within reach;with chair alarm set;with nursing/sitter in room;with family/visitor present;with SCD's reapplied Nurse Communication: Mobility status PT Visit Diagnosis: Other abnormalities of gait and mobility (R26.89);Muscle weakness (generalized) (M62.81);Difficulty in walking, not elsewhere classified (R26.2);Unsteadiness on feet (R26.81);Pain;History of falling (Z91.81) Pain - Right/Left: Right Pain - part of body: Hip    Time: 1324-4010 PT Time Calculation (min) (ACUTE ONLY): 24 min   Charges:   PT Evaluation $PT Eval Moderate Complexity: 1 Mod   PT General Charges $$ ACUTE PT VISIT: 1 Visit         Gunther Zawadzki, PT, GCS 10/03/23,9:55 AM

## 2023-10-03 NOTE — Plan of Care (Signed)
Problem: Clinical Measurements: Goal: Will remain free from infection Outcome: Progressing Goal: Respiratory complications will improve Outcome: Progressing Goal: Cardiovascular complication will be avoided Outcome: Progressing   Problem: Nutrition: Goal: Adequate nutrition will be maintained Outcome: Progressing   Problem: Coping: Goal: Level of anxiety will decrease Outcome: Progressing   Problem: Elimination: Goal: Will not experience complications related to bowel motility Outcome: Progressing Goal: Will not experience complications related to urinary retention Outcome: Progressing   Problem: Pain Managment: Goal: General experience of comfort will improve Outcome: Progressing   Problem: Safety: Goal: Ability to remain free from injury will improve Outcome: Progressing

## 2023-10-03 NOTE — Hospital Course (Signed)
89 year old man past medical history of atrial fibrillation on warfarin and flecainide, cardiomyopathy, hypertension GERD and osteoporosis presents after a fall.  Patient found to have a periprosthetic femur fracture.  Medical management recommended.  10/14.  With working with physical therapy yesterday the patient did have an episode where he had to sit down and sort of closes eyes for few minutes.  Patient was orthostatic afterwards and given a fluid bolus and IV fluids overnight.  His Cardizem was held and Flomax was held. 10/15.  Patient still slightly orthostatic today but able to hold his blood pressure above 110 with standing for 3 minutes.  Will continue to hold Cardizem.  Will hold Flomax today and hopefully can restart tomorrow.  In the afternoon, the patient with standing for me for 3 minutes and walking in place did not have any feelings like he was going to pass out. 10/16--assumed care--overall feels at baseline. No dizziness today. BP a bit high will resume low dose cardizem. Pt agreeable for discharge

## 2023-10-03 NOTE — Progress Notes (Signed)
ORTHOPEDIC PROGRESS NOTE  SUBJECTIVE:     The patient is alert and oriented x 3.  OBJECTIVE:  Vitals:   10/03/23 0303 10/03/23 0753  BP: 129/85 133/81  Pulse: (!) 53 (!) 56  Resp:  16  Temp: 97.9 F (36.6 C) 98.1 F (36.7 C)  SpO2: 99% 98%    The patient's bilateral lower extremities are neurovascularly intact.  Their toes are pink and warm with brisk capillary refill time.  Dorsalis pedis and posterior tibial pulse are 2+ and symmetric. The patient's calves are soft, nontender, with a negative Homans test bilaterally. The patient is making slow progress with physical therapy as expected based on their overall medical condition.  Labs:  Recent Labs    10/02/23 1156 10/03/23 0526  HGB 13.1 11.3*   Recent Labs    10/02/23 1156 10/03/23 0526  WBC 14.1* 8.2  RBC 4.66 4.02*  HCT 39.2 34.3*  PLT 221 191   Recent Labs    10/02/23 1156  NA 133*  K 4.4  CL 102  CO2 25  BUN 18  CREATININE 0.90  GLUCOSE 108*  CALCIUM 8.3*   Recent Labs    10/02/23 1156 10/03/23 0526  INR 2.5* 3.3*      ASSESSMENT:  Assessment: 89 year old male status post left and right total hip arthroplasties with recent right periprosthetic greater trochanteric fracture radiograph 09-22-2023 and new left proximal anterior femur periprosthetic femur fracture around the total hip arthroplasty.    The patient's orthopedic condition is stable and they continue to improve daily.   PLAN:  Continue PT to mobilize patient as tolerated. Continue DVT prophylaxis.   Cecil Cranker M.D. 10/03/2023 11:42 AM

## 2023-10-03 NOTE — TOC Initial Note (Signed)
Transition of Care [facility]) - Initial/Assessment Note    Patient Details  Name: Robert Burch MRN: [id number] Date of Birth: 02-23-1933  [facility] CM/SW Contact:    Kemper Durie, RN Phone Number: 10/03/2023, 12:05 PM  Clinical Narrative:                  Spoke with patient and wife, admitted from home, wife provides transportation to MD appointments.  PCP is Dr. Terance Hart, [facility] [address].  Has BSC, walker, and wheelchair already in the home.  Advised of PT recommendations for SNF for short term rehab, he agrees, preference is [facility].  He and wife are currently on the wait list for their retirement community.  If he is unable to get into [facility], [facility] is the second choice.  FL2 done, bed search initiated.   Expected Discharge Plan: Skilled Nursing Facility Barriers to Discharge: Continued Medical Work up   Patient Goals and CMS Choice Patient states their goals for this hospitalization and ongoing recovery are:: SNF for short term rehab   Choice offered to / list presented to : Patient      Expected Discharge Plan and Services     Post Acute Care Choice: Skilled Nursing Facility Living arrangements for the past 2 months: Single Family Home                                      Prior Living Arrangements/Services Living arrangements for the past 2 months: Single Family Home Lives with:: Spouse Patient language and need for interpreter reviewed:: Yes Do you feel safe going back to the place where you live?: Yes        Care giver support system in place?: Yes (comment) Current home services: DME Criminal Activity/Legal Involvement Pertinent to Current Situation/Hospitalization: No - Comment as needed  Activities of Daily Living   ADL Screening (condition at time of admission) Independently performs ADLs?: Yes (appropriate for developmental age) Is the patient deaf or have difficulty hearing?:  No Does the patient have difficulty seeing, even when wearing glasses/contacts?: No Does the patient have difficulty concentrating, remembering, or making decisions?: No  Permission Sought/Granted   Permission granted to share information with : Yes, Verbal Permission Granted              Emotional Assessment       Orientation: : Oriented to Self, Oriented to Place, Oriented to  Time, Oriented to Situation   Psych Involvement: No (comment)  Admission diagnosis:  Closed right femoral fracture (HCC) [S72.91XA] Closed fracture of proximal end of right femur, initial encounter (HCC) [S72.001A] Patient Active Problem List   Diagnosis Date Noted   Leukocytosis 10/02/2023   Age related osteoporosis 07/16/2020   Closed right femoral fracture (HCC) 03/16/2020   PAC (premature atrial contraction) 07/06/2019   PVC (premature ventricular contraction) 07/06/2019   Anemia 10/12/2018   Sinus bradycardia 09/23/2018   Coronary artery calcification seen on CAT scan 11/06/2017   Diverticulum of bladder 11/26/2015   Gross hematuria 10/29/2015   Hyperlipidemia 04/22/2015   Cervical radiculitis 05/22/2014   DDD (degenerative disc disease), lumbar 05/22/2014   Lumbar stenosis with neurogenic claudication 05/22/2014   Paroxysmal atrial fibrillation (HCC)    Atypical chest pain    GERD (gastroesophageal reflux disease)    Elevated prostate specific antigen (PSA) 12/06/2013   Anxiety 10/17/2013   Benign  localized hyperplasia of prostate with urinary obstruction 08/31/2013   Chronic prostatitis 08/31/2013   ED (erectile dysfunction) of organic origin 08/31/2013   Incomplete emptying of bladder 08/31/2013   Syncope and collapse 12/07/2011   Snoring 07/02/2011   Abnormal respiratory rate 07/02/2011   Essential hypertension 07/22/2009   PCP:  Dorothey Baseman, MD Pharmacy:   [phone] [address], [facility] - [address] [place] [address] Phone: [phone]  Fax: [phone]     Social Determinants of Health (SDOH) Social History: SDOH Screenings   Food Insecurity: No Food Insecurity (10/02/2023)  Housing: Low Risk  (10/02/2023)  Transportation Needs: No Transportation Needs (10/02/2023)  Utilities: Not At Risk (10/02/2023)  Financial Resource Strain: Low Risk  (08/10/2023)   Received from [facility]  Tobacco Use: Low Risk  (10/02/2023)   SDOH Interventions:     Readmission Risk Interventions     No data to display

## 2023-10-03 NOTE — Progress Notes (Signed)
Progress Note   Patient: Robert Burch ZOX:[id number] DOB: [birth year] DOA: 10/02/2023     1 DOS: the patient was seen and examined on 10/03/2023   Brief hospital course: 89 year old man past medical history of atrial fibrillation on warfarin and flecainide, cardiomyopathy, hypertension GERD and osteoporosis presents after a fall.  Patient found to have a periprosthetic femur fracture.  Medical management recommended.  Assessment and Plan: * Periprosthetic fracture of proximal end of femur Medical management, pain control.  PT and OT recommending rehab.  TOC to look into options.  Paroxysmal atrial fibrillation (HCC) Continue flecainide.  Pharmacy to dose Coumadin.  Leukocytosis Reactive in nature  Benign localized hyperplasia of prostate with urinary obstruction On Flomax and finasteride  Essential hypertension On low-dose Cardizem        Subjective: Patient at rest does not have any pain in his hip.  With standing up had severe pain.  Patient seen sitting in the chair.  Admitted with periprosthetic femoral neck fracture  Physical Exam: Vitals:   10/02/23 2010 10/03/23 0009 10/03/23 0303 10/03/23 0753  BP: (!) 160/91 125/63 129/85 133/81  Pulse: 64 (!) 51 (!) 53 (!) 56  Resp:  16  16  Temp: 98.2 F (36.8 C) 98.1 F (36.7 C) 97.9 F (36.6 C) 98.1 F (36.7 C)  TempSrc:  Oral  Oral  SpO2: 98% 98% 99% 98%   Physical Exam HENT:     Head: Normocephalic.     Mouth/Throat:     Pharynx: No oropharyngeal exudate.  Eyes:     General: Lids are normal.     Conjunctiva/sclera: Conjunctivae normal.  Cardiovascular:     Rate and Rhythm: Regular rhythm. Bradycardia present.     Heart sounds: Normal heart sounds, S1 normal and S2 normal.  Pulmonary:     Breath sounds: No decreased breath sounds, wheezing, rhonchi or rales.  Abdominal:     Palpations: Abdomen is soft.     Tenderness: There is no abdominal tenderness.  Musculoskeletal:     Right lower leg: No  swelling.     Left lower leg: No swelling.  Skin:    General: Skin is warm.     Findings: No rash.  Neurological:     Mental Status: He is alert and oriented to person, place, and time.     Data Reviewed: Hemoglobin 11.3, white blood cell count 8.2, creatinine 0.9  Family Communication: Spoke with wife at bedside  Disposition: Status is: Inpatient Remains inpatient appropriate because: Will need rehab, TOC to look into options.  Planned Discharge Destination: Rehab    Time spent: 28 minutes  Author: Alford Highland, MD 10/03/2023 12:24 PM  For on call review [email or web address].

## 2023-10-03 NOTE — Assessment & Plan Note (Signed)
Seen by orthopedic and recommended medical management and pain control.  Patient was able to stand up with me and walk and place today.  Did not feel like he was going to pass out.  We did get insurance authorization and okay to go out to rehab.

## 2023-10-03 NOTE — Discharge Instructions (Signed)
ORTHOPEDIC DISCHARGE INSTRUCTIONS  Follow Up Appointment:  Follow-up in the office in 10-14 days.  Please contact the [facility] at [phone]  to schedule your follow-up appointment.  Dressing and cast care instructions:  No dressing is needed.  Weight-bearing status:  You are 25% partial weightbearing on the right lower extremity with your walker and PT.   Diet:   You may resume your regular diet as tolerated.  Begin with clear liquids and slowly advance to your normal diet.  Medications:   Take your medicines as prescribed. You may have written prescriptions or your prescriptions may have been E-prescribed to your pharmacy. If you were given prescriptions for any blood thinners, it is a very important that you take these medications as prescribed to prevent blood clots.  General instructions:  Elevate the affected extremity to control pain and swelling. Apply ice packs to the affected area to control pain and swelling.  Surgery and pain medications may lead to constipation. It is easier to prevent this than it is to treat it. We recommend MiraLAX or Senna/Senakot for laxatives and Colace as a stool softener as needed after surgery. Drink plenty of fluids to stay well-hydrated. Consult your local pharmacist for other treatment recommendations.   Please perform deep breathing exercises every hour to increase the airflow in your lungs which could predispose you to running a low-grade fever and increase your risk of pneumonia.

## 2023-10-03 NOTE — Evaluation (Signed)
Occupational Therapy Evaluation Patient Details Name: Robert Burch MRN: [id number] DOB: [birth year] Today's Date: 10/03/2023   History of Present Illness Robert Burch is a 89 y.o. male with medical history significant of atrial fibrillation on Warfarin and flecainide, non-ischemic cardiomyopathy c/b HFrEF with recovered EF (55-60%), primary hypertension, GERD, osteoporosis, who presents to the ED due to a ground-level fall. Imaging reveals right bipolar hemiarthroplasty periprosthetic fracture involving the greater trochanter and extending around the stem proximally.  Implants appear stable at this time. non surgical management at this time. PWB 25%   Clinical Impression   Robert Burch was seen for OT/PT co-evaluation this date. Prior to hospital admission, pt was generally independent with ADL management. He denies use of AE/DME for ADLS or additional falls history in the last 6 months. Pt reports spouse assists with IADL management including driving. Pt lives in a 1 level home with his spouse who is able to provide 24/7 assist. Pt presents to acute OT demonstrating impaired ADL performance and functional mobility 2/2 increased RLE pain with mobility, generalized weakness, and decreased safety awareness (See OT problem list for additional functional deficits). Pt currently requires MOD A +2 for bed/functional mobility with consistent cueing for adherence to 25% PWB precautions. Pt would benefit from skilled OT services to address noted impairments and functional limitations (see below for any additional details) in order to maximize safety and independence while minimizing falls risk and caregiver burden. Anticipate the need for follow up OT services upon acute hospital DC.        If plan is discharge home, recommend the following: Assistance with cooking/housework;Assist for transportation;Two people to help with walking and/or transfers;Two people to help with  bathing/dressing/bathroom;Help with stairs or ramp for entrance    Functional Status Assessment  Patient has had a recent decline in their functional status and demonstrates the ability to make significant improvements in function in a reasonable and predictable amount of time.  Equipment Recommendations  None recommended by OT (Pt has necessary equipment)    Recommendations for Other Services       Precautions / Restrictions Precautions Precautions: Fall Restrictions Weight Bearing Restrictions: Yes RLE Weight Bearing: Partial weight bearing RLE Partial Weight Bearing Percentage or Pounds: 25%      Mobility Bed Mobility Overal bed mobility: Needs Assistance Bed Mobility: Supine to Sit     Supine to sit: Mod assist, +2 for physical assistance, HOB elevated, Used rails          Transfers Overall transfer level: Needs assistance Equipment used: Rolling walker (2 wheels) Transfers: Sit to/from Stand, Bed to chair/wheelchair/BSC Sit to Stand: Mod assist, +2 physical assistance, From elevated surface     Step pivot transfers: +2 physical assistance, Mod assist            Balance Overall balance assessment: Needs assistance Sitting-balance support: Feet supported Sitting balance-Leahy Scale: Fair Sitting balance - Comments: feeling a bit dizzy upon sitting, but improves as session progresses   Standing balance support: Bilateral upper extremity supported, During functional activity, Reliant on assistive device for balance Standing balance-Leahy Scale: Fair Standing balance comment: Heavy reliance on RW for UE support during weight shift to maintain 25% PWB                           ADL either performed or assessed with clinical judgement   ADL Overall ADL's : Needs assistance/impaired  General ADL Comments: Pt functionally limited by increased pain/decreased functional use of his RLE. He has orders  to remain PWB (25%) with any functional mobility attempts. Requires +2 MOD A for bed mobility and step pivot transfers to chair using a RW. Anticipate +2 MOD A to step pivot to [facility] for toileting needs. MOD A for seated LB ADL management with +2 assist for STS. SET UP assist for seated UB ADL management.     Vision Baseline Vision/History: 1 Wears glasses Ability to See in Adequate Light: 1 Impaired Patient Visual Report: No change from baseline       Perception         Praxis         Pertinent Vitals/Pain Pain Assessment Pain Assessment: 0-10 Pain Score: 8  Pain Location: R LE Pain Descriptors / Indicators: Discomfort, Sore Pain Intervention(s): Limited activity within patient's tolerance, Monitored during session, Premedicated before session, Patient requesting pain meds-RN notified, Repositioned     Extremity/Trunk Assessment Upper Extremity Assessment Upper Extremity Assessment: Overall WFL for tasks assessed   Lower Extremity Assessment Lower Extremity Assessment: RLE deficits/detail RLE Deficits / Details: PWB 25% RLE: Unable to fully assess due to pain RLE Sensation: WNL RLE Coordination: decreased gross motor;decreased fine motor   Cervical / Trunk Assessment Cervical / Trunk Assessment: Normal   Communication Communication Communication: Hearing impairment Cueing Techniques: Verbal cues   Cognition Arousal: Alert Behavior During Therapy: WFL for tasks assessed/performed Overall Cognitive Status: Within Functional Limits for tasks assessed                                       General Comments       Exercises Other Exercises Other Exercises: Pt/caregiver educated on role of OT in acute setting, safety, falls prevention strategies, safe use of AE/DME for ADL management, PWB status on RLE, and routines modifications to support safety and functional independence upon hospital DC.   Shoulder Instructions      Home Living Family/patient  expects to be discharged to:: Private residence Living Arrangements: Spouse/significant other Available Help at Discharge: Family Type of Home: House Home Access: Stairs to enter Secretary/administrator of Steps: 3 Entrance Stairs-Rails: Right Home Layout: One level     Bathroom Shower/Tub: Producer, television/film/video: Handicapped height     Home Equipment: Agricultural consultant (2 wheels);Rollator (4 wheels);Wheelchair - Building surveyor;Shower seat;BSC/3in1 Adaptive Equipment: Reacher;Long-handled shoe horn        Prior Functioning/Environment Prior Level of Function : Independent/Modified Independent             Mobility Comments: not using AD prior to admission, does not drive ADLs Comments: independent        OT Problem List: Decreased strength;Decreased range of motion;Decreased safety awareness;Decreased activity tolerance;Decreased knowledge of use of DME or AE;Impaired balance (sitting and/or standing);Decreased knowledge of precautions;Decreased coordination      OT Treatment/Interventions: Self-care/ADL training;Therapeutic exercise;Energy conservation;Patient/family education;DME and/or AE instruction;Balance training    OT Goals(Current goals can be found in the care plan section) Acute Rehab OT Goals Patient Stated Goal: To go to rehab OT Goal Formulation: With patient/family Time For Goal Achievement: 10/17/23 Potential to Achieve Goals: Good ADL Goals Pt Will Perform Grooming: sitting;with modified independence Pt Will Perform Lower Body Dressing: sit to/from stand;with supervision;with set-up;with caregiver independent in assisting Pt Will Transfer to Toilet: bedside commode;with supervision;with set-up;stand pivot transfer;ambulating Pt Will Perform  Toileting - Clothing Manipulation and hygiene: sitting/lateral leans;with adaptive equipment;with supervision;with set-up  OT Frequency: Min 1X/week    Co-evaluation PT/OT/SLP  Co-Evaluation/Treatment: Yes Reason for Co-Treatment: For patient/therapist safety;To address functional/ADL transfers PT goals addressed during session: Mobility/safety with mobility;Balance;Proper use of DME OT goals addressed during session: ADL's and self-care;Proper use of Adaptive equipment and DME      AM-PAC OT "6 Clicks" Daily Activity     Outcome Measure Help from another person eating meals?: None Help from another person taking care of personal grooming?: A Little Help from another person toileting, which includes using toliet, bedpan, or urinal?: A Lot Help from another person bathing (including washing, rinsing, drying)?: A Lot Help from another person to put on and taking off regular upper body clothing?: A Little Help from another person to put on and taking off regular lower body clothing?: A Lot 6 Click Score: 16   End of Session Equipment Utilized During Treatment: Gait belt;Rolling walker (2 wheels) Nurse Communication: Mobility status  Activity Tolerance: Patient tolerated treatment well Patient left: in chair;with call bell/phone within reach;with chair alarm set;with family/visitor present  OT Visit Diagnosis: Other abnormalities of gait and mobility (R26.89);Pain Pain - Right/Left: Right Pain - part of body: Hip                Time: 2130-8657 OT Time Calculation (min): 25 min Charges:  OT General Charges $OT Visit: 1 Visit OT Evaluation $OT Eval Moderate Complexity: 1 Mod  Rockney Ghee, M.S., OTR/L 10/03/23, 12:11 PM

## 2023-10-04 DIAGNOSIS — D72829 Elevated white blood cell count, unspecified: Secondary | ICD-10-CM | POA: Diagnosis not present

## 2023-10-04 DIAGNOSIS — I480 Paroxysmal atrial fibrillation: Secondary | ICD-10-CM | POA: Diagnosis not present

## 2023-10-04 DIAGNOSIS — N401 Enlarged prostate with lower urinary tract symptoms: Secondary | ICD-10-CM | POA: Diagnosis not present

## 2023-10-04 DIAGNOSIS — M978XXA Periprosthetic fracture around other internal prosthetic joint, initial encounter: Secondary | ICD-10-CM | POA: Diagnosis not present

## 2023-10-04 LAB — PROTIME-INR
INR: 3.2 — ABNORMAL HIGH (ref 0.8–1.2)
Prothrombin Time: 33 s — ABNORMAL HIGH (ref 11.4–15.2)

## 2023-10-04 MED ORDER — SODIUM CHLORIDE 0.9 % IV BOLUS
500.0000 mL | Freq: Once | INTRAVENOUS | Status: AC
  Administered 2023-10-04: 500 mL via INTRAVENOUS

## 2023-10-04 MED ORDER — SODIUM CHLORIDE 0.9 % IV SOLN: INTRAVENOUS | Status: DC

## 2023-10-04 MED ORDER — ADULT MULTIVITAMIN W/MINERALS CH
1.0000 | ORAL_TABLET | Freq: Every day | ORAL | Status: DC
  Administered 2023-10-04 – 2023-10-05 (×2): 1 via ORAL
  Filled 2023-10-04 (×2): qty 1

## 2023-10-04 MED ORDER — OYSTER SHELL CALCIUM/D3 500-5 MG-MCG PO TABS
1.0000 | ORAL_TABLET | Freq: Every day | ORAL | Status: DC
  Administered 2023-10-04 – 2023-10-05 (×2): 1 via ORAL
  Filled 2023-10-04 (×2): qty 1

## 2023-10-04 NOTE — Progress Notes (Signed)
ORTHOPEDIC PROGRESS NOTE  SUBJECTIVE:     The patient has a normal mood and affect. The patient is resting quietly in bed.  Family at bedside.  OBJECTIVE:  Vitals:   10/04/23 0057 10/04/23 0921  BP: 126/66 (!) 148/64  Pulse: 71 70  Resp: 18 17  Temp: 98.7 F (37.1 C) 98.3 F (36.8 C)  SpO2: 96% 97%    The patient's bilateral lower extremities are neurovascularly intact.  Their toes are pink and warm with brisk capillary refill time.  Dorsalis pedis and posterior tibial pulse are 2+ and symmetric. The patient's calves are soft, nontender, with a negative Homans test bilaterally. The patient is making slow progress with physical therapy as expected based on their overall medical condition.  The patient's right hip exam shows a slightly proved range of motion today.  Patient remains on warfarin.  Labs:  Recent Labs    10/02/23 1156 10/03/23 0526  HGB 13.1 11.3*   Recent Labs    10/02/23 1156 10/03/23 0526  WBC 14.1* 8.2  RBC 4.66 4.02*  HCT 39.2 34.3*  PLT 221 191   Recent Labs    10/02/23 1156  NA 133*  K 4.4  CL 102  CO2 25  BUN 18  CREATININE 0.90  GLUCOSE 108*  CALCIUM 8.3*   Recent Labs    10/03/23 0526 10/04/23 0433  INR 3.3* 3.2*      ASSESSMENT:  Assessment: 89 year old male with chronic atrial fibrillation on warfarin with right bipolar hemiarthroplasty periprosthetic fracture involving the greater trochanter and extending around the stem proximally.  Implants appear stable at this time.    The patient's orthopedic condition is stable and they continue to improve daily. The patient is making slow progress as expected based on their overall medical condition. The patient is stable and can be discharged from the orthopedic standpoint.  The patient continues on warfarin at his baseline for DVT prophylaxis.   PLAN:  Continue PT to mobilize patient as tolerated. Continue DVT prophylaxis. Discharge planning for subacute rehab placement/skilled  nursing facility.  Discharge planning note reports bed is available tomorrow.   Cecil Cranker M.D. 10/04/2023 12:33 PM

## 2023-10-04 NOTE — TOC Progression Note (Signed)
Transition of Care [facility]) - Progression Note    Patient Details  Name: Robert Burch MRN: [id number] Date of Birth: 09/08/1933  Transition of Care [facility]) CM/SW Contact  Marlowe Sax, RN Phone Number: 10/04/2023, 10:31 AM  Clinical Narrative:    Resent the bedsearch for STR, reached out to [facility], [facility] and [facility] looking for a STR bed, will review bed offers once obtained   Expected Discharge Plan: Skilled Nursing Facility Barriers to Discharge: Continued Medical Work up  Expected Discharge Plan and Services     Post Acute Care Choice: Skilled Nursing Facility Living arrangements for the past 2 months: Single Family Home                                       Social Determinants of Health (SDOH) Interventions SDOH Screenings   Food Insecurity: No Food Insecurity (10/02/2023)  Housing: Low Risk  (10/02/2023)  Transportation Needs: No Transportation Needs (10/02/2023)  Utilities: Not At Risk (10/02/2023)  Financial Resource Strain: Low Risk  (08/10/2023)   Received from [facility]  Tobacco Use: Low Risk  (10/02/2023)    Readmission Risk Interventions     No data to display

## 2023-10-04 NOTE — Progress Notes (Signed)
Physical Therapy Treatment Patient Details Name: Robert Burch MRN: [id number] DOB: 26-Mar-1933 Today's Date: 10/04/2023   History of Present Illness Robert Burch is a 89 y.o. male with medical history significant of atrial fibrillation on Warfarin and flecainide, non-ischemic cardiomyopathy c/b HFrEF with recovered EF (55-60%), primary hypertension, GERD, osteoporosis, who presents to the ED due to a ground-level fall. Imaging reveals right bipolar hemiarthroplasty periprosthetic fracture involving the greater trochanter and extending around the stem proximally.  Implants appear stable at this time. non surgical management at this time. PWB 25%    PT Comments  Pt alert and oriented to self, family, situation. Did display difficulties following one step commands, repetition and verbal/visual cues needed. The patient required minA to transition to EOB and definite use of bed rails. Sit <> stand maxA with RW but unable to maintain PWB. Sit <> stand with two assist maxAx2 and pt able to pivot to recliner. Noted for syncopal episode, RN notified immediately, pt unresponsive ~30seconds. BP obtained several minutes later at 121/66 HR 63. Pt repositioned in upright sitting and BP 102/60, returned to EOB with maxAx2 and supine, bed pan placed with CNA at end of session. The patient would benefit from further skilled PT intervention to continue to progress towards goals.     If plan is discharge home, recommend the following: Two people to help with walking and/or transfers;A lot of help with bathing/dressing/bathroom;Assist for transportation   Can travel by private vehicle     No  Equipment Recommendations  None recommended by PT    Recommendations for Other Services       Precautions / Restrictions Precautions Precautions: Fall Restrictions Weight Bearing Restrictions: Yes RLE Weight Bearing: Partial weight bearing RLE Partial Weight Bearing Percentage or Pounds: 25      Mobility  Bed Mobility Overal bed mobility: Needs Assistance Bed Mobility: Supine to Sit, Sit to Supine     Supine to sit: Min assist, Used rails, HOB elevated Sit to supine: Mod assist, +2 for physical assistance        Transfers Overall transfer level: Needs assistance Equipment used: Rolling walker (2 wheels) Transfers: Sit to/from Stand, Bed to chair/wheelchair/BSC Sit to Stand: Mod assist, +2 physical assistance, From elevated surface   Step pivot transfers: Max assist, +2 physical assistance Squat pivot transfers: Max assist, +2 physical assistance     General transfer comment: maxA x2to step pivot to recliner in order to maintain PWB; noted for syncopal episode in recliner and maxAx2 to return to bed once vitals were taken    Ambulation/Gait                   Stairs             Wheelchair Mobility     Tilt Bed    Modified Rankin (Stroke Patients Only)       Balance Overall balance assessment: Needs assistance Sitting-balance support: Feet supported Sitting balance-Leahy Scale: Fair Sitting balance - Comments: feeling a bit dizzy upon sitting, but improves as session progresses   Standing balance support: Bilateral upper extremity supported, During functional activity, Reliant on assistive device for balance Standing balance-Leahy Scale: Poor Standing balance comment: Heavy reliance on RW for UE support during weight shift to maintain 25% PWB                            Cognition Arousal: Alert Behavior During Therapy: [facility] for tasks assessed/performed Overall Cognitive  Status: Within Functional Limits for tasks assessed                                 General Comments: difficulty intermittently with one step commands; repetition needed        Exercises      General Comments        Pertinent Vitals/Pain Pain Assessment Pain Assessment: 0-10 Pain Score: 6  Pain Location: R LE Pain Descriptors /  Indicators: Discomfort, Sore Pain Intervention(s): Limited activity within patient's tolerance, Monitored during session, Repositioned    Home Living                          Prior Function            PT Goals (current goals can now be found in the care plan section) Progress towards PT goals: Progressing toward goals    Frequency    Min 1X/week      PT Plan      Co-evaluation              AM-PAC PT "6 Clicks" Mobility   Outcome Measure  Help needed turning from your back to your side while in a flat bed without using bedrails?: A Lot Help needed moving from lying on your back to sitting on the side of a flat bed without using bedrails?: A Lot Help needed moving to and from a bed to a chair (including a wheelchair)?: A Lot Help needed standing up from a chair using your arms (e.g., wheelchair or bedside chair)?: A Lot Help needed to walk in hospital room?: Total Help needed climbing 3-5 steps with a railing? : Total 6 Click Score: 10    End of Session Equipment Utilized During Treatment: Gait belt Activity Tolerance: Other (comment);Treatment limited secondary to medical complications (Comment) (limited by BP) Patient left: in chair;with call bell/phone within reach;with chair alarm set;with nursing/sitter in room;with family/visitor present;with SCD's reapplied Nurse Communication: Mobility status PT Visit Diagnosis: Other abnormalities of gait and mobility (R26.89);Muscle weakness (generalized) (M62.81);Difficulty in walking, not elsewhere classified (R26.2);Unsteadiness on feet (R26.81);Pain;History of falling (Z91.81) Pain - Right/Left: Right Pain - part of body: Hip     Time: 1610-9604 PT Time Calculation (min) (ACUTE ONLY): 40 min  Charges:    $Therapeutic Activity: 38-52 mins PT General Charges $$ ACUTE PT VISIT: 1 Visit                     Olga Coaster PT, DPT 3:26 PM,10/04/23

## 2023-10-04 NOTE — TOC Progression Note (Signed)
Transition of Care [facility]) - Progression Note    Patient Details  Name: Robert Burch MRN: [id number] Date of Birth: Mar 28, 1933  Transition of Care [facility]) CM/SW Contact  Marlowe Sax, RN Phone Number: 10/04/2023, 11:21 AM  Clinical Narrative:     Met with the patient and his family in the room Reviewed the bed offers,m They accepted [place], [place] will have a bed Tuesday, Ins auth started, ref number [id number]  Expected Discharge Plan: Skilled Nursing Facility Barriers to Discharge: Continued Medical Work up  Expected Discharge Plan and Services     Post Acute Care Choice: Skilled Nursing Facility Living arrangements for the past 2 months: Single Family Home                                       Social Determinants of Health (SDOH) Interventions SDOH Screenings   Food Insecurity: No Food Insecurity (10/02/2023)  Housing: Low Risk  (10/02/2023)  Transportation Needs: No Transportation Needs (10/02/2023)  Utilities: Not At Risk (10/02/2023)  Financial Resource Strain: Low Risk  (08/10/2023)   Received from [facility]  Tobacco Use: Low Risk  (10/02/2023)    Readmission Risk Interventions     No data to display

## 2023-10-04 NOTE — Plan of Care (Signed)
Problem: Clinical Measurements: Goal: Will remain free from infection Outcome: Progressing   Problem: Activity: Goal: Risk for activity intolerance will decrease Outcome: Progressing   Problem: Nutrition: Goal: Adequate nutrition will be maintained Outcome: Progressing   Problem: Pain Managment: Goal: General experience of comfort will improve Outcome: Progressing

## 2023-10-04 NOTE — Plan of Care (Signed)
Problem: Clinical Measurements: Goal: Will remain free from infection Outcome: Progressing Goal: Respiratory complications will improve Outcome: Progressing Goal: Cardiovascular complication will be avoided Outcome: Progressing   Problem: Nutrition: Goal: Adequate nutrition will be maintained Outcome: Progressing   Problem: Coping: Goal: Level of anxiety will decrease Outcome: Progressing   Problem: Elimination: Goal: Will not experience complications related to bowel motility Outcome: Progressing Goal: Will not experience complications related to urinary retention Outcome: Progressing   Problem: Pain Managment: Goal: General experience of comfort will improve Outcome: Progressing   Problem: Safety: Goal: Ability to remain free from injury will improve Outcome: Progressing

## 2023-10-04 NOTE — Plan of Care (Signed)
  Problem: Clinical Measurements: Goal: Will remain free from infection Outcome: Progressing Goal: Diagnostic test results will improve Outcome: Progressing Goal: Respiratory complications will improve Outcome: Progressing Goal: Cardiovascular complication will be avoided Outcome: Progressing   Problem: Nutrition: Goal: Adequate nutrition will be maintained Outcome: Progressing   Problem: Coping: Goal: Level of anxiety will decrease Outcome: Progressing   Problem: Elimination: Goal: Will not experience complications related to bowel motility Outcome: Progressing Goal: Will not experience complications related to urinary retention Outcome: Progressing   Problem: Pain Managment: Goal: General experience of comfort will improve Outcome: Progressing   Problem: Safety: Goal: Ability to remain free from injury will improve Outcome: Progressing

## 2023-10-04 NOTE — Progress Notes (Signed)
Initial Nutrition Assessment  DOCUMENTATION CODES:   Not applicable  INTERVENTION:   -Obtain new wt -MVI with minerals daily -Continue with regular diet -Magic cup BID with meals, each supplement provides 290 kcal and 9 grams of protein   NUTRITION DIAGNOSIS:   Increased nutrient needs related to acute illness as evidenced by estimated needs.  GOAL:   Patient will meet greater than or equal to 90% of their needs  MONITOR:   PO intake  REASON FOR ASSESSMENT:   Consult Assessment of nutrition requirement/status, Hip fracture protocol  ASSESSMENT:   Pt with past medical history of atrial fibrillation on warfarin and flecainide, cardiomyopathy, hypertension GERD and osteoporosis presents after a fall.  Patient found to have a periprosthetic femur fracture.  Pt admitted with closed rt femur fracture.   Reviewed I/O's: -1 L x 24 hours and -1.3 L since admission  UOP: 1.5 L x 24 hours  Per MD notes, plan for medical management.   Spoke with pt at bedside, who was pleasant and in good spirits today. He reports he slept well last night and had just ordered breakfast. He has a good appetite and has been consuming most of his food here; noted documented meal completions 100%.   PTA pt reports consuming 2-3 meals per day, prepared by his wife. Meals consist of a meat (salmon or chicken), starch, and vegetable). Noted pot with multiple missing teeth, but denies need for mechanically altered diet. He has an upcoming dentist appointment. Pt shares he is often selective of what he eats due to chewing as well as limiting fiber and nuts due to diverticulitis.   Pt denies any weight loss. Reviewed wt hx; pt has experienced a 2% wt loss over the past 6 months, which is not significant for time frame. Last recorded wt was one month ago; will obtain new wt to better assess wt trends.   Discussed importance of good meal intake to promote healing.   Per TOC notes, plan for SNF placement at  discharge.   Medications reviewed.   Labs reviewed: Na: 133.    NUTRITION - FOCUSED PHYSICAL EXAM:  Flowsheet Row Most Recent Value  Orbital Region No depletion  Upper Arm Region No depletion  Thoracic and Lumbar Region No depletion  Buccal Region No depletion  Temple Region No depletion  Clavicle Bone Region Mild depletion  Clavicle and Acromion Bone Region No depletion  Scapular Bone Region No depletion  Dorsal Hand Mild depletion  Patellar Region No depletion  Anterior Thigh Region No depletion  Posterior Calf Region No depletion  Edema (RD Assessment) None  Hair Reviewed  Eyes Reviewed  Mouth Reviewed  Skin Reviewed  Nails Reviewed       Diet Order:   Diet Order             Diet regular Fluid consistency: Thin  Diet effective now                   EDUCATION NEEDS:   Education needs have been addressed  Skin:  Skin Assessment: Reviewed RN Assessment  Last BM:  10/03/23  Height:   Ht Readings from Last 1 Encounters:  08/16/23 5\' 9"  (1.753 m)    Weight:   Wt Readings from Last 1 Encounters:  08/24/23 84.4 kg    Ideal Body Weight:  72.7 kg  BMI:  There is no height or weight on file to calculate BMI.  Estimated Nutritional Needs:   Kcal:  1700-1900  Protein:  85-100 grams  Fluid:  > 1.7 L    Levada Schilling, RD, LDN, CDCES Registered Dietitian III Certified Diabetes Care and Education Specialist Please refer to [facility] for RD and/or RD on-call/weekend/after hours pager

## 2023-10-04 NOTE — Progress Notes (Signed)
Progress Note   Patient: Robert Burch ZOX:[id number] DOB: 02-19-1933 DOA: 10/02/2023     2 DOS: the patient was seen and examined on 10/04/2023   Brief hospital course: 89 year old man past medical history of atrial fibrillation on warfarin and flecainide, cardiomyopathy, hypertension GERD and osteoporosis presents after a fall.  Patient found to have a periprosthetic femur fracture.  Medical management recommended.  Assessment and Plan: * Periprosthetic fracture of proximal end of femur Medical management, pain control.  PT and OT recommending rehab.  Needs insurance authorization for rehab.  Paroxysmal atrial fibrillation (HCC) Continue flecainide.  Pharmacy to dose Coumadin.  Leukocytosis Reactive in nature  Benign localized hyperplasia of prostate with urinary obstruction On Flomax and finasteride  Essential hypertension On low-dose Cardizem        Subjective: Patient feels okay when he is not moving.  When he put some weight down on his leg he has a lot of pain.  Admitted with periprosthetic hip fracture  Physical Exam: Vitals:   10/03/23 1559 10/03/23 2106 10/04/23 0057 10/04/23 0921  BP: (!) 140/69 (!) 160/79 126/66 (!) 148/64  Pulse: 68 62 71 70  Resp: 18 16 18 17   Temp: (!) 97.4 F (36.3 C) 98.4 F (36.9 C) 98.7 F (37.1 C) 98.3 F (36.8 C)  TempSrc:   Oral   SpO2: 99% 100% 96% 97%   Physical Exam HENT:     Head: Normocephalic.     Mouth/Throat:     Pharynx: No oropharyngeal exudate.  Eyes:     General: Lids are normal.     Conjunctiva/sclera: Conjunctivae normal.  Cardiovascular:     Rate and Rhythm: Regular rhythm. Bradycardia present.     Heart sounds: Normal heart sounds, S1 normal and S2 normal.  Pulmonary:     Breath sounds: No decreased breath sounds, wheezing, rhonchi or rales.  Abdominal:     Palpations: Abdomen is soft.     Tenderness: There is no abdominal tenderness.  Musculoskeletal:     Right lower leg: No swelling.      Left lower leg: No swelling.  Skin:    General: Skin is warm.     Findings: No rash.  Neurological:     Mental Status: He is alert and oriented to person, place, and time.     Data Reviewed: INR 3.2  Family Communication: Left message for wife  Disposition: Status is: Inpatient Remains inpatient appropriate because: Needs insurance authorization for rehab  Planned Discharge Destination: Rehab    Time spent: 27 minutes  Author: Alford Highland, MD 10/04/2023 1:34 PM  For on call review [email or web address].

## 2023-10-04 NOTE — Consult Note (Signed)
PHARMACY - ANTICOAGULATION CONSULT NOTE  Pharmacy Consult for Warfarin dosing Indication: atrial fibrillation  Allergies  Allergen Reactions   Ciprofloxacin     Diarrhea and stomach pains   Lanoxin [Digoxin]    Lopressor [Metoprolol Tartrate]    Penicillins     Other reaction(s): Unknown   Sulfonamide Derivatives     Rash    Verapamil    Sulfa Antibiotics Rash    Vital Signs: Temp: 98.7 F (37.1 C) (10/14 0057) Temp Source: Oral (10/14 0057) BP: 126/66 (10/14 0057) Pulse Rate: 71 (10/14 0057)  Labs: Recent Labs    10/02/23 1156 10/03/23 0526 10/04/23 0433  HGB 13.1 11.3*  --   HCT 39.2 34.3*  --   PLT 221 191  --   LABPROT 27.2* 33.6* 33.0*  INR 2.5* 3.3* 3.2*  CREATININE 0.90  --   --     CrCl cannot be calculated (Unknown ideal weight.).   Medical History: Past Medical History:  Diagnosis Date   Atypical chest pain    a. Nonobstructive, very minor irregs by cath 2007;  b. 06/2009 Ex MV: small area of apical lateral ischemia;  c. 06/2009 Cath: essentially normal;  d. 04/2014 Lexiscan MV: EF 59%, no ischemia/infarct->low risk.   Cancer (HCC)    Chronic anticoagulation    a. Chronic Coumadin   Chronic anxiety    Diverticulosis    Dysrhythmia    atrial fib   GERD (gastroesophageal reflux disease)    Hypertension    IBS (irritable bowel syndrome)    Nonischemic cardiomyopathy (HCC)    a. EF 45% by echo 2010;  b. 05/2012 Echo: EF 55-65%, no rwma, Gr 1 DD, mild AI/MR, mildly dil LA.   Paralyzed vocal cords    Paroxysmal atrial fibrillation (HCC)    a. Treated with flecainide   Proteinuria    History of    Medications:  Current home Warfarin dose is 2.5 mg po daily, last reported dose PTA 10/01/23.  Assessment: 89 yo male presented to ED due to a fall with closed right femoral fracture.  PMH includes Afib, HTN, HFrEF, GERD, and osteoporosis.  Pharmacy consulted to manage Warfarin dosing.  Goal of Therapy:  INR 2-3 Monitor platelets by anticoagulation  protocol: Yes    Date INR Warfarin Dose  10/12 2.5 2.5 mg   10/13 3.3 ---  10/14 3.2         Plan:  INR slightly above goal range of 2.0-3.0 Will hold dose for today Continue daily INR for now CBC at least every 3 days  Markus Casten Rodriguez-Guzman PharmD, BCPS 10/04/2023 8:38 AM

## 2023-10-05 DIAGNOSIS — D72829 Elevated white blood cell count, unspecified: Secondary | ICD-10-CM | POA: Diagnosis not present

## 2023-10-05 DIAGNOSIS — I480 Paroxysmal atrial fibrillation: Secondary | ICD-10-CM | POA: Diagnosis not present

## 2023-10-05 DIAGNOSIS — M978XXA Periprosthetic fracture around other internal prosthetic joint, initial encounter: Secondary | ICD-10-CM | POA: Diagnosis not present

## 2023-10-05 DIAGNOSIS — I951 Orthostatic hypotension: Secondary | ICD-10-CM | POA: Diagnosis not present

## 2023-10-05 LAB — BASIC METABOLIC PANEL
Anion gap: 5 (ref 5–15)
BUN: 18 mg/dL (ref 8–23)
CO2: 26 mmol/L (ref 22–32)
Calcium: 7.8 mg/dL — ABNORMAL LOW (ref 8.9–10.3)
Chloride: 101 mmol/L (ref 98–111)
Creatinine, Ser: 0.76 mg/dL (ref 0.61–1.24)
GFR, Estimated: >60 mL/min (ref >60)
Glucose, Bld: 120 mg/dL — ABNORMAL HIGH (ref 70–99)
Potassium: 4.6 mmol/L (ref 3.5–5.1)
Sodium: 132 mmol/L — ABNORMAL LOW (ref 135–145)

## 2023-10-05 LAB — CBC
HCT: 30.5 % — ABNORMAL LOW (ref 39.0–52.0)
Hemoglobin: 10.2 g/dL — ABNORMAL LOW (ref 13.0–17.0)
MCH: 28.5 pg (ref 26.0–34.0)
MCHC: 33.4 g/dL (ref 30.0–36.0)
MCV: 85.2 fL (ref 80.0–100.0)
Platelets: 178 10*3/uL (ref 150–400)
RBC: 3.58 MIL/uL — ABNORMAL LOW (ref 4.22–5.81)
RDW: 15.7 % — ABNORMAL HIGH (ref 11.5–15.5)
WBC: 7.8 10*3/uL (ref 4.0–10.5)
nRBC: 0 % (ref 0.0–0.2)

## 2023-10-05 LAB — PROTIME-INR
INR: 2.9 — ABNORMAL HIGH (ref 0.8–1.2)
Prothrombin Time: 30.6 s — ABNORMAL HIGH (ref 11.4–15.2)

## 2023-10-05 MED ORDER — WARFARIN SODIUM 2.5 MG PO TABS
2.5000 mg | ORAL_TABLET | Freq: Once | ORAL | Status: AC
  Administered 2023-10-05: 2.5 mg via ORAL
  Filled 2023-10-05: qty 1

## 2023-10-05 MED ORDER — OYSTER SHELL CALCIUM/D3 500-5 MG-MCG PO TABS: 1.0000 | ORAL_TABLET | Freq: Every day | ORAL | 0 refills | Status: DC

## 2023-10-05 MED ORDER — SODIUM CHLORIDE 0.9 % IV SOLN: INTRAVENOUS | Status: DC

## 2023-10-05 MED ORDER — ACETAMINOPHEN 325 MG PO TABS: 650.0000 mg | ORAL_TABLET | Freq: Four times a day (QID) | ORAL | Status: DC

## 2023-10-05 MED ORDER — WARFARIN SODIUM 2.5 MG PO TABS: ORAL_TABLET | ORAL | Status: DC

## 2023-10-05 MED ORDER — HYDROCODONE-ACETAMINOPHEN 5-325 MG PO TABS: 1.0000 | ORAL_TABLET | Freq: Four times a day (QID) | ORAL | 0 refills | Status: DC | PRN

## 2023-10-05 MED ORDER — TAMSULOSIN HCL 0.4 MG PO CAPS: ORAL_CAPSULE | ORAL | 3 refills | Status: DC

## 2023-10-05 MED ORDER — FLECAINIDE ACETATE 100 MG PO TABS: 100.0000 mg | ORAL_TABLET | Freq: Two times a day (BID) | ORAL | 0 refills | Status: DC | PRN

## 2023-10-05 MED ORDER — BISACODYL 5 MG PO TBEC: 5.0000 mg | DELAYED_RELEASE_TABLET | Freq: Every day | ORAL | Status: DC | PRN

## 2023-10-05 NOTE — TOC Progression Note (Signed)
Transition of Care [facility]) - Progression Note    Patient Details  Name: Robert Burch MRN: [id number] Date of Birth: Nov 11, 1933  Transition of Care [facility]) CM/SW Contact  Marlowe Sax, RN Phone Number: 10/05/2023, 3:06 PM  Clinical Narrative:    Sue Lush with [facility] stated they are not able to accept today but can tomorrow   Expected Discharge Plan: Skilled Nursing Facility Barriers to Discharge: Continued Medical Work up  Expected Discharge Plan and Services     Post Acute Care Choice: Skilled Nursing Facility Living arrangements for the past 2 months: Single Family Home Expected Discharge Date: 10/05/23                                     Social Determinants of Health (SDOH) Interventions SDOH Screenings   Food Insecurity: No Food Insecurity (10/02/2023)  Housing: Low Risk  (10/02/2023)  Transportation Needs: No Transportation Needs (10/02/2023)  Utilities: Not At Risk (10/02/2023)  Financial Resource Strain: Low Risk  (08/10/2023)   Received from [facility]  Tobacco Use: Low Risk  (10/02/2023)    Readmission Risk Interventions     No data to display

## 2023-10-05 NOTE — TOC Progression Note (Signed)
Transition of Care [facility]) - Progression Note    Patient Details  Name: Robyn Nohr MRN: [id number] Date of Birth: 11-14-1933  Transition of Care [facility]) CM/SW Contact  Marlowe Sax, RN Phone Number: 10/05/2023, 9:53 AM  Clinical Narrative:    Went to [employer] Portal and the Ins portal is down, I called [employer] ti check ins authorization, they stated that there system is down at the moment and they can not look up patient information    Expected Discharge Plan: Skilled Nursing Facility Barriers to Discharge: Continued Medical Work up  Expected Discharge Plan and Services     Post Acute Care Choice: Skilled Nursing Facility Living arrangements for the past 2 months: Single Family Home                                       Social Determinants of Health (SDOH) Interventions SDOH Screenings   Food Insecurity: No Food Insecurity (10/02/2023)  Housing: Low Risk  (10/02/2023)  Transportation Needs: No Transportation Needs (10/02/2023)  Utilities: Not At Risk (10/02/2023)  Financial Resource Strain: Low Risk  (08/10/2023)   Received from [facility]  Tobacco Use: Low Risk  (10/02/2023)    Readmission Risk Interventions     No data to display

## 2023-10-05 NOTE — Care Management Important Message (Signed)
Important Message  Patient Details  Name: Robert Burch MRN: [id number] Date of Birth: Nov 19, 1933   Important Message Given:  N/A - LOS <3 / Initial given by admissions     [name] 10/05/2023, 9:49 AM

## 2023-10-05 NOTE — Progress Notes (Signed)
Notified too late to go to rehab today. See discharge summery for todays note.

## 2023-10-05 NOTE — Plan of Care (Signed)
Problem: Education: Goal: Knowledge of General Education information will improve Description: Including pain rating scale, medication(s)/side effects and non-pharmacologic comfort measures Outcome: Progressing   Problem: Clinical Measurements: Goal: Ability to maintain clinical measurements within normal limits will improve Outcome: Progressing Goal: Will remain free from infection Outcome: Progressing   Problem: Nutrition: Goal: Adequate nutrition will be maintained Outcome: Progressing   Problem: Pain Managment: Goal: General experience of comfort will improve Outcome: Progressing

## 2023-10-05 NOTE — TOC Progression Note (Signed)
Transition of Care [facility]) - Progression Note    Patient Details  Name: Arcenio Mullaly MRN: [id number] Date of Birth: 20-Feb-1933  Transition of Care [facility]) CM/SW Contact  Marlowe Sax, RN Phone Number: 10/05/2023, 2:18 PM  Clinical Narrative:    Coralyn Pear out to [facility] to check the Insurance status Ins was approved [id number] 10/15-17   Expected Discharge Plan: Skilled Nursing Facility Barriers to Discharge: Continued Medical Work up  Expected Discharge Plan and Services     Post Acute Care Choice: Skilled Nursing Facility Living arrangements for the past 2 months: Single Family Home                                       Social Determinants of Health (SDOH) Interventions SDOH Screenings   Food Insecurity: No Food Insecurity (10/02/2023)  Housing: Low Risk  (10/02/2023)  Transportation Needs: No Transportation Needs (10/02/2023)  Utilities: Not At Risk (10/02/2023)  Financial Resource Strain: Low Risk  (08/10/2023)   Received from [facility]  Tobacco Use: Low Risk  (10/02/2023)    Readmission Risk Interventions     No data to display

## 2023-10-05 NOTE — Plan of Care (Signed)
  Problem: Education: Goal: Knowledge of General Education information will improve Description Including pain rating scale, medication(s)/side effects and non-pharmacologic comfort measures Outcome: Progressing   

## 2023-10-05 NOTE — Consult Note (Signed)
PHARMACY - ANTICOAGULATION CONSULT NOTE  Pharmacy Consult for Warfarin dosing Indication: atrial fibrillation  Allergies  Allergen Reactions   Ciprofloxacin     Diarrhea and stomach pains   Lanoxin [Digoxin]    Lopressor [Metoprolol Tartrate]    Penicillins     Other reaction(s): Unknown   Sulfonamide Derivatives     Rash    Verapamil    Sulfa Antibiotics Rash    Vital Signs: Temp: 97.6 F (36.4 C) (10/15 0734) BP: 154/65 (10/15 0734) Pulse Rate: 51 (10/15 0734)  Labs: Recent Labs    10/02/23 1156 10/03/23 0526 10/04/23 0433 10/05/23 0606  HGB 13.1 11.3*  --  10.2*  HCT 39.2 34.3*  --  30.5*  PLT 221 191  --  178  LABPROT 27.2* 33.6* 33.0* 30.6*  INR 2.5* 3.3* 3.2* 2.9*  CREATININE 0.90  --   --  0.76    Estimated Creatinine Clearance: 70.4 mL/min (by C-G formula based on SCr of 0.76 mg/dL).   Medical History: Past Medical History:  Diagnosis Date   Atypical chest pain    a. Nonobstructive, very minor irregs by cath 2007;  b. 06/2009 Ex MV: small area of apical lateral ischemia;  c. 06/2009 Cath: essentially normal;  d. 04/2014 Lexiscan MV: EF 59%, no ischemia/infarct->low risk.   Cancer (HCC)    Chronic anticoagulation    a. Chronic Coumadin   Chronic anxiety    Diverticulosis    Dysrhythmia    atrial fib   GERD (gastroesophageal reflux disease)    Hypertension    IBS (irritable bowel syndrome)    Nonischemic cardiomyopathy (HCC)    a. EF 45% by echo 2010;  b. 05/2012 Echo: EF 55-65%, no rwma, Gr 1 DD, mild AI/MR, mildly dil LA.   Paralyzed vocal cords    Paroxysmal atrial fibrillation (HCC)    a. Treated with flecainide   Proteinuria    History of    Medications:  Current home Warfarin dose is 2.5 mg po daily, last reported dose PTA 10/01/23.  Assessment: 89 yo male presented to ED due to a fall with closed right femoral fracture.  PMH includes Afib, HTN, HFrEF, GERD, and osteoporosis.  Pharmacy consulted to manage Warfarin dosing.  Goal of  Therapy:  INR 2-3 Monitor platelets by anticoagulation protocol: Yes    Date INR Warfarin Dose  10/12 2.5 2.5 mg   10/13 3.3 ---  10/14 3.2 ---  10/15 2.9     Plan:  INR back within above goal range of 2.0-3.0, and Hgb stable with 1pt drop in 48hrs (possible dilution). Platelets also stable and no s/sx of bleeding reported by RN. Will resume warfarin today with 2.5 mg po x 1 dose at 1600. Continue daily INR for now. CBC at least every 3 days.  Danette Weinfeld Rodriguez-Guzman PharmD, BCPS 10/05/2023 7:36 AM

## 2023-10-05 NOTE — Discharge Summary (Signed)
Physician Discharge Summary   Patient: Robert Burch MRN: [id number] DOB: 12/27/1932  Admit date:     10/02/2023  Discharge date: 10/05/23  Discharge Physician: Alford Highland   PCP: Dorothey Baseman, MD   Recommendations at discharge:    Follow up with team at rehab  Discharge Diagnoses: Principal Problem:   Periprosthetic fracture of proximal end of femur Active Problems:   Orthostatic hypotension   Paroxysmal atrial fibrillation (HCC)   Leukocytosis   Essential hypertension   Benign localized hyperplasia of prostate with urinary obstruction    Hospital Course: 89 year old man past medical history of atrial fibrillation on warfarin and flecainide, cardiomyopathy, hypertension GERD and osteoporosis presents after a fall.  Patient found to have a periprosthetic femur fracture.  Medical management recommended.  10/14.  With working with physical therapy yesterday the patient did have an episode where he had to sit down and sort of closes eyes for few minutes.  Patient was orthostatic afterwards and given a fluid bolus and IV fluids overnight.  His Cardizem was held and Flomax was held. 10/15.  Patient still slightly orthostatic today but able to hold his blood pressure above 110 with standing for 3 minutes.  Will continue to hold Cardizem.  Will hold Flomax today and hopefully can restart tomorrow.  In the afternoon, the patient with standing for me for 3 minutes and walking in place did not have any feelings like he was going to pass out.  Assessment and Plan: * Periprosthetic fracture of proximal end of femur Seen by orthopedic and recommended medical management and pain control.  Patient was able to stand up with me and walk and place today.  Did not feel like he was going to pass out.  We did get insurance authorization and okay to go out to rehab.  Orthostatic hypotension Holding the low dose cardizem.  Will hold flomax for a another day and restart tomorrow night.   Ted hose.  Continue to work with physical therapy.  Paroxysmal atrial fibrillation (HCC) Flecainide is as needed for palpitations and/or atrial fibrillation.  Pharmacy to dose Coumadin.  Holding Cardizem low-dose at this point.  Leukocytosis Reactive in nature  Benign localized hyperplasia of prostate with urinary obstruction Continue finasteride.  Holding Flomax today.  May restart tomorrow night.  Essential hypertension Holding low-dose Cardizem with orthostasis.         Consultants: Orthopedic surgery Procedures performed: none  Disposition: Rehabilitation facility Diet recommendation:  Cardiac diet DISCHARGE MEDICATION: Allergies as of 10/05/2023       Reactions   Ciprofloxacin    Diarrhea and stomach pains   Lanoxin [digoxin]    Lopressor [metoprolol Tartrate]    Penicillins    Other reaction(s): Unknown   Sulfonamide Derivatives    Rash   Verapamil    Sulfa Antibiotics Rash        Medication List     STOP taking these medications    cefUROXime 500 MG tablet Commonly known as: CEFTIN   diltiazem 30 MG tablet Commonly known as: CARDIZEM   docusate sodium 100 MG capsule Commonly known as: COLACE   fluticasone 50 MCG/ACT nasal spray Commonly known as: FLONASE   nitrofurantoin (macrocrystal-monohydrate) 100 MG capsule Commonly known as: MACROBID       TAKE these medications    acetaminophen 325 MG tablet Commonly known as: TYLENOL Take 2 tablets (650 mg total) by mouth every 6 (six) hours.   acidophilus Caps capsule Take 1 capsule by mouth daily.   alendronate  70 MG tablet Commonly known as: FOSAMAX Take 70 mg by mouth once a week.   bisacodyl 5 MG EC tablet Commonly known as: DULCOLAX Take 1 tablet (5 mg total) by mouth daily as needed for moderate constipation. What changed:  how much to take how to take this reasons to take this   calcium-vitamin D 500-5 MG-MCG tablet Commonly known as: OSCAL WITH D Take 1 tablet by mouth  daily. Start taking on: October 06, 2023   clotrimazole-betamethasone cream Commonly known as: LOTRISONE Apply topically.   Coenzyme Q10 50 MG Caps Take 50 mg by mouth 2 (two) times daily.   Cranberry 500 MG Caps Take 500 mg by mouth daily.   finasteride 5 MG tablet Commonly known as: PROSCAR TAKE 1 TABLET (5 MG TOTAL) BY MOUTH DAILY.   Fish Oil 1000 MG Caps Take 1,000 mg by mouth daily.   Flax Seed Oil 1000 MG Caps Take 1,000 mg by mouth daily.   flecainide 100 MG tablet Commonly known as: TAMBOCOR Take 1 tablet (100 mg total) by mouth every 12 (twelve) hours as needed (atrial fibrillation or palpitations). What changed:  medication strength how much to take how to take this when to take this reasons to take this additional instructions   fluticasone-salmeterol 250-50 MCG/ACT Aepb Commonly known as: ADVAIR Inhale 1 puff into the lungs in the morning and at bedtime.   Gemtesa 75 MG Tabs Generic drug: Vibegron Take 1 tablet (75 mg total) by mouth daily.   Ginger 500 MG Caps Take 500 mg by mouth 2 (two) times daily.   HYDROcodone-acetaminophen 5-325 MG tablet Commonly known as: NORCO/VICODIN Take 1 tablet by mouth every 6 (six) hours as needed for up to 5 days for severe pain (pain score 7-10).   hydrocortisone 25 MG suppository Commonly known as: ANUSOL-HC Place 25 mg rectally daily as needed for hemorrhoids or anal itching.   magnesium oxide 400 MG tablet Commonly known as: MAG-OX TAKE 1 TABLET BY MOUTH TWICE A DAY   multivitamin tablet Take 1 tablet by mouth daily.   pantoprazole 20 MG tablet Commonly known as: PROTONIX Take 20 mg by mouth daily.   polyethylene glycol 17 g packet Commonly known as: MIRALAX / GLYCOLAX Take 17 g by mouth daily as needed for mild constipation or moderate constipation.   saw palmetto 160 MG capsule Take 160 mg by mouth 2 (two) times daily.   tamsulosin 0.4 MG Caps capsule Commonly known as: FLOMAX One capsule po  at bedtime (start on 10/07/23) What changed: additional instructions   Vitamin D3 10 MCG (400 UNIT) Caps Take 800 Units by mouth daily.   warfarin 2.5 MG tablet Commonly known as: COUMADIN One 2.5mg  tab po at 1400pm What changed:  how much to take how to take this when to take this additional instructions        Contact information for after-discharge care     Destination     [facility] .   Service: Skilled Nursing Contact information: [address] [place] [address] [phone]                    Discharge Exam: Ceasar Mons Weights   10/04/23 1544  Weight: 85.1 kg   Physical Exam HENT:     Head: Normocephalic.     Mouth/Throat:     Pharynx: No oropharyngeal exudate.  Eyes:     General: Lids are normal.     Conjunctiva/sclera: Conjunctivae normal.  Cardiovascular:     Rate and Rhythm: Regular rhythm. Bradycardia present.     Heart sounds: Normal heart sounds, S1 normal and S2 normal.  Pulmonary:     Breath sounds: No decreased breath sounds, wheezing, rhonchi or rales.  Abdominal:     Palpations: Abdomen is soft.     Tenderness: There is no abdominal tenderness.  Musculoskeletal:     Right lower leg: No swelling.     Left lower leg: No swelling.  Skin:    General: Skin is warm.     Findings: No rash.  Neurological:     Mental Status: He is alert and oriented to person, place, and time.      Condition at discharge: stable  The results of significant diagnostics from this hospitalization (including imaging, microbiology, ancillary and laboratory) are listed below for reference.   Imaging Studies: US RENAL  Result Date: 10/02/2023 CLINICAL DATA:  Recurrent UTIs EXAM: RENAL / URINARY TRACT ULTRASOUND COMPLETE COMPARISON:  CT 05/10/2018 and previous FINDINGS: Right Kidney: Renal measurements: 10.5 x 5.1 x 5.2 cm = volume: 145 mL. Normal echotexture. No hydronephrosis. 2.5 cm mid pole partially exophytic cyst.  1.1 cm exophytic lower pole cyst. Left Kidney: Renal measurements: 11.2 x 5.8 x 4.8 cm = volume: 163 mL. No hydronephrosis. 1.4 cm midpole cyst. 0.8 cm lower pole cortical cyst. Bladder: Appears normal for degree of bladder distention. Bilateral ureteral jets demonstrated. Other: Prostate enlargement 5.7 x 5.9 x 5.4 cm. IMPRESSION: 1. No hydronephrosis or other acute findings. 2. Prostate enlargement. Electronically Signed   By: Corlis Leak M.D.   On: 10/02/2023 11:25   DG Hip Unilat W or Wo Pelvis 2-3 Views Right  Result Date: 10/02/2023 CLINICAL DATA:  Pain post fall EXAM: DG HIP (WITH OR WITHOUT PELVIS) 2-3V RIGHT COMPARISON:  03/16/2020 FINDINGS: Right hemiarthroplasty projects in expected location. There is a new fracture of the proximal femoral shaft adjacent to the component stem , displaced up to 3 mm. Mild left hip DJD. Lower lumbar spondylitic changes stable. IMPRESSION: Minimally displaced fracture of the proximal right femoral shaft adjacent to the hemiarthroplasty stem. Electronically Signed   By: Corlis Leak M.D.   On: 10/02/2023 11:23      Labs: CBC: Recent Labs  Lab 10/02/23 1156 10/03/23 0526 10/05/23 0606  WBC 14.1* 8.2 7.8  NEUTROABS 11.8*  --   --   HGB 13.1 11.3* 10.2*  HCT 39.2 34.3* 30.5*  MCV 84.1 85.3 85.2  PLT 221 191 178   Basic Metabolic Panel: Recent Labs  Lab 10/02/23 1156 10/05/23 0606  NA 133* 132*  K 4.4 4.6  CL 102 101  CO2 25 26  GLUCOSE 108* 120*  BUN 18 18  CREATININE 0.90 0.76  CALCIUM 8.3* 7.8*   Liver Function Tests: Recent Labs  Lab 10/02/23 1156  AST 20  ALT 13  ALKPHOS 51  BILITOT 0.8  PROT 6.9  ALBUMIN 3.6     Discharge time spent: greater than 30 minutes.  Signed: Alford Highland, MD [facility] 10/05/2023

## 2023-10-05 NOTE — Progress Notes (Signed)
   10/05/23 1500  Spiritual Encounters  Type of Visit Initial  Care provided to: Patient  Referral source Chaplain assessment  Reason for visit Routine spiritual support  OnCall Visit No   Chaplain provided compassionate presence. Chaplain spiritual support services remain available as the need arises.

## 2023-10-05 NOTE — Assessment & Plan Note (Addendum)
Holding the low dose cardizem.  Will hold flomax for a another day and restart tomorrow night.  Ted hose.  Continue to work with physical therapy.

## 2023-10-05 NOTE — Progress Notes (Addendum)
Physical Therapy Treatment Patient Details Name: Robert Burch MRN: [id number] DOB: March 28, 1933 Today's Date: 10/05/2023   History of Present Illness Robert Burch is a 89 y.o. male with medical history significant of atrial fibrillation on Warfarin and flecainide, non-ischemic cardiomyopathy c/b HFrEF with recovered EF (55-60%), primary hypertension, GERD, osteoporosis, who presents to the ED due to a ground-level fall. Imaging reveals right bipolar hemiarthroplasty periprosthetic fracture involving the greater trochanter and extending around the stem proximally.  Implants appear stable at this time. non surgical management at this time. PWB 25%    PT Comments  Pt is received in bed with spouse at bedside, he is agreeable to PT session. Pt endorses 5/10 NPS R thigh pain near fracture site at beginning of session. Pt performs bed mobility minA, transfers and amb minA x2 for safety. Assessed BP during initial seated EOB time to ensure BP was WNL-reading was 126/62 (82) SpO2% 97% on RA with no reports of dizziness. During standing activity using RW, Pt maintained RLE on top of PT's foot to ensure adherence to 25% PWB during lateral stepping towards HOB minA x2. Pt reports brief dizziness midway through lateral stepping activity which improved with stance break and water. Pt continues to require frequent cuing for AD sequencing but demonstrates improvements. No reports of increased RLE discomfort at end of session. Overall, Pt demonstrates steady progression towards skilled PT goals but would benefit from skilled PT to address above deficits and promote optimal return to PLOF.   If plan is discharge home, recommend the following: Two people to help with walking and/or transfers;A lot of help with bathing/dressing/bathroom;Assist for transportation   Can travel by private vehicle     No  Equipment Recommendations  None recommended by PT    Recommendations for Other Services        Precautions / Restrictions Precautions Precautions: Fall Restrictions Weight Bearing Restrictions: Yes RLE Weight Bearing: Partial weight bearing RLE Partial Weight Bearing Percentage or Pounds: 25%     Mobility  Bed Mobility Overal bed mobility: Needs Assistance Bed Mobility: Supine to Sit, Sit to Supine     Supine to sit: Min assist, Used rails, HOB elevated Sit to supine: Min assist, Used rails   General bed mobility comments: able to perform bed mobility minA x1 for RLE management, otherwise uses railings to complete task; no reports of dizziness during initial sitting EOB    Transfers Overall transfer level: Needs assistance Equipment used: Rolling walker (2 wheels) Transfers: Sit to/from Stand Sit to Stand: Min assist, +2 physical assistance, From elevated surface           General transfer comment: minA x2 for STS from elevated surface using RW; no reports of dizzines during transition    Ambulation/Gait Ambulation/Gait assistance: Min assist, +2 physical assistance Gait Distance (Feet): 5 Feet Assistive device: Rolling walker (2 wheels) Gait Pattern/deviations: Step-to pattern, Decreased step length - right, Decreased step length - left, Decreased stride length, Wide base of support Gait velocity: decreased     General Gait Details: able to perform lat steps towards HOB using RW minA x2 with Pt maintaining RLE on top of PTs foot to ensure adherence of 25% PWB; brief report of dizziness during activity which improved during static standing balance and drink of water   Stairs             Wheelchair Mobility     Tilt Bed    Modified Rankin (Stroke Patients Only)  Balance Overall balance assessment: Needs assistance Sitting-balance support: Feet supported Sitting balance-Leahy Scale: Good Sitting balance - Comments: able to maintain seated EOB balance during functional activities   Standing balance support: Bilateral upper extremity  supported, During functional activity, Reliant on assistive device for balance Standing balance-Leahy Scale: Fair Standing balance comment: heavy reliance of AD during static standing balance and adhering to 25% PWB on RLE                            Cognition Arousal: Alert Behavior During Therapy: WFL for tasks assessed/performed Overall Cognitive Status: Within Functional Limits for tasks assessed                                 General Comments: Pleasant and motivated to work with PT        Exercises Other Exercises Other Exercises: Reviewed and educated Pt on PWB precautions on RLE for reinforcement    General Comments General comments (skin integrity, edema, etc.): assessed BP during initial seated EOB with reading of 126/62 (82), SpO2% 97% on RA with no reports of dizziness      Pertinent Vitals/Pain Pain Assessment Pain Assessment: 0-10 Pain Score: 5  Pain Location: R LE Pain Descriptors / Indicators: Discomfort, Sore Pain Intervention(s): Monitored during session, Repositioned    Home Living                          Prior Function            PT Goals (current goals can now be found in the care plan section) Acute Rehab PT Goals Patient Stated Goal: to improve, patient and wife open to SNF if needed PT Goal Formulation: With patient/family Time For Goal Achievement: 10/17/23 Potential to Achieve Goals: Good Progress towards PT goals: Progressing toward goals    Frequency    7X/week      PT Plan      Co-evaluation              AM-PAC PT "6 Clicks" Mobility   Outcome Measure  Help needed turning from your back to your side while in a flat bed without using bedrails?: A Little Help needed moving from lying on your back to sitting on the side of a flat bed without using bedrails?: A Little Help needed moving to and from a bed to a chair (including a wheelchair)?: A Lot Help needed standing up from a chair  using your arms (e.g., wheelchair or bedside chair)?: A Lot Help needed to walk in hospital room?: Total Help needed climbing 3-5 steps with a railing? : Total 6 Click Score: 12    End of Session Equipment Utilized During Treatment: Gait belt Activity Tolerance: Patient tolerated treatment well Patient left: in bed;with call bell/phone within reach;with bed alarm set;with family/visitor present Nurse Communication: Mobility status PT Visit Diagnosis: Other abnormalities of gait and mobility (R26.89);Muscle weakness (generalized) (M62.81);Difficulty in walking, not elsewhere classified (R26.2);Unsteadiness on feet (R26.81);Pain;History of falling (Z91.81) Pain - Right/Left: Right Pain - part of body: Leg     Time: 1610-9604 PT Time Calculation (min) (ACUTE ONLY): 23 min  Charges:    $Gait Training: 8-22 mins $Therapeutic Activity: 8-22 mins PT General Charges $$ ACUTE PT VISIT: 1 Visit  Elmon Else, SPT    Iris Tatsch 10/05/2023, 4:03 PM

## 2023-10-06 ENCOUNTER — Other Ambulatory Visit: Payer: Self-pay | Admitting: Student

## 2023-10-06 DIAGNOSIS — M978XXA Periprosthetic fracture around other internal prosthetic joint, initial encounter: Secondary | ICD-10-CM

## 2023-10-06 DIAGNOSIS — Z96649 Presence of unspecified artificial hip joint: Secondary | ICD-10-CM | POA: Diagnosis not present

## 2023-10-06 LAB — FERRITIN: Ferritin: 45 ng/mL (ref 24–336)

## 2023-10-06 LAB — HEMOGLOBIN: Hemoglobin: 10.1 g/dL — ABNORMAL LOW (ref 13.0–17.0)

## 2023-10-06 LAB — PROTIME-INR
INR: 2.8 — ABNORMAL HIGH (ref 0.8–1.2)
Prothrombin Time: 29.5 s — ABNORMAL HIGH (ref 11.4–15.2)

## 2023-10-06 MED ORDER — DILTIAZEM HCL 30 MG PO TABS
30.0000 mg | ORAL_TABLET | Freq: Once | ORAL | Status: AC
  Administered 2023-10-06: 30 mg via ORAL
  Filled 2023-10-06: qty 1

## 2023-10-06 MED ORDER — DILTIAZEM HCL 30 MG PO TABS: 30.0000 mg | ORAL_TABLET | Freq: Every day | ORAL | Status: DC

## 2023-10-06 MED ORDER — HYDROCODONE-ACETAMINOPHEN 5-325 MG PO TABS: 1.0000 | ORAL_TABLET | Freq: Four times a day (QID) | ORAL | 0 refills | Status: DC | PRN

## 2023-10-06 MED ORDER — HYDRALAZINE HCL 20 MG/ML IJ SOLN: 10.0000 mg | Freq: Four times a day (QID) | INTRAMUSCULAR | Status: DC | PRN

## 2023-10-06 MED ORDER — TAMSULOSIN HCL 0.4 MG PO CAPS: 0.4000 mg | ORAL_CAPSULE | Freq: Every day | ORAL | Status: DC

## 2023-10-06 NOTE — TOC Transition Note (Signed)
Transition of Care [facility]) - CM/SW Discharge Note   Patient Details  Name: Robert Burch MRN: [id number] Date of Birth: [birth year]  Transition of Care [facility]) CM/SW Contact:  Margarito Liner, LCSW Phone Number: 10/06/2023, 9:49 AM   Clinical Narrative:  Patient has orders to discharge to [facility] today. RN will call report to [phone] (Room 111). EMS transport has been arranged and he is first on the list. No further concerns. CSW signing off.   Final next level of care: Skilled Nursing Facility Barriers to Discharge: Transportation   Patient Goals and CMS Choice   Choice offered to / list presented to : Patient  Discharge Placement PASRR number recieved: 10/03/23 PASRR number recieved: 10/03/23            Patient chooses bed at: [facility] Patient to be transferred to facility by: EMS Name of family member notified: Manraj Yeo Patient and family notified of of transfer: 10/06/23  Discharge Plan and Services Additional resources added to the After Visit Summary for       Post Acute Care Choice: Skilled Nursing Facility                               Social Determinants of Health (SDOH) Interventions SDOH Screenings   Food Insecurity: No Food Insecurity (10/02/2023)  Housing: Low Risk  (10/02/2023)  Transportation Needs: No Transportation Needs (10/02/2023)  Utilities: Not At Risk (10/02/2023)  Financial Resource Strain: Low Risk  (08/10/2023)   Received from [facility]  Tobacco Use: Low Risk  (10/02/2023)     Readmission Risk Interventions     No data to display

## 2023-10-06 NOTE — Progress Notes (Signed)
Occupational Therapy Treatment Patient Details Name: Robert Burch MRN: [id number] DOB: [birth year] Today's Date: 10/06/2023   History of present illness Robert Burch is a 89 y.o. male with medical history significant of atrial fibrillation on Warfarin and flecainide, non-ischemic cardiomyopathy c/b HFrEF with recovered EF (55-60%), primary hypertension, GERD, osteoporosis, who presents to the ED due to a ground-level fall. Imaging reveals right bipolar hemiarthroplasty periprosthetic fracture involving the greater trochanter and extending around the stem proximally.  Implants appear stable at this time. non surgical management at this time. PWB 25%   OT comments  Pt seen for OT treatment on this date. Upon arrival to room pt supine in bed, agreeable to tx. Pt's wife present during the session. Pt completed supine<>sit t/f with Min A, used rails and HOB elevated. Pt completed upper body dressing seated at the EOB with supervision and set up. Pt completed lower body dressing and completed sit to/from stand with MOD A +RW. Pt required Mod A x2 for sit>stand t/f from standard height bed. Pt educated and cued during session of PWB precautions on RLE. Pt left supine in bed with call bell within reach and needs met. Pt making good progress toward goals, will continue to follow POC. Discharge recommendation remains appropriate.        If plan is discharge home, recommend the following:  Assistance with cooking/housework;Assist for transportation;Two people to help with walking and/or transfers;Two people to help with bathing/dressing/bathroom;Help with stairs or ramp for entrance   Equipment Recommendations  None recommended by OT    Recommendations for Other Services      Precautions / Restrictions Precautions Precautions: Fall Restrictions Weight Bearing Restrictions: Yes RLE Weight Bearing: Partial weight bearing RLE Partial Weight Bearing Percentage or Pounds: 25        Mobility Bed Mobility Overal bed mobility: Needs Assistance Bed Mobility: Supine to Sit, Sit to Supine     Supine to sit: Min assist, Used rails, HOB elevated Sit to supine: Min assist, Used rails        Transfers Overall transfer level: Needs assistance Equipment used: Rolling walker (2 wheels) Transfers: Sit to/from Stand Sit to Stand: +2 physical assistance, Mod assist                 Balance Overall balance assessment: Needs assistance Sitting-balance support: Feet supported Sitting balance-Leahy Scale: Good Sitting balance - Comments: able to maintain seated EOB balance during functional activities   Standing balance support: Bilateral upper extremity supported, During functional activity, Reliant on assistive device for balance Standing balance-Leahy Scale: Fair                             ADL either performed or assessed with clinical judgement   ADL Overall ADL's : Needs assistance/impaired                 Upper Body Dressing : Supervision/safety;Set up   Lower Body Dressing: Moderate assistance;Sit to/from stand;Cueing for sequencing;Cueing for safety                      Extremity/Trunk Assessment Upper Extremity Assessment Upper Extremity Assessment: Overall WFL for tasks assessed   Lower Extremity Assessment Lower Extremity Assessment: RLE deficits/detail RLE Deficits / Details: PWB 25%        Vision Baseline Vision/History: 1 Wears glasses Ability to See in Adequate Light: 1 Impaired Patient Visual Report: No change from baseline  Perception     Praxis      Cognition Arousal: Alert Behavior During Therapy: WFL for tasks assessed/performed Overall Cognitive Status: Within Functional Limits for tasks assessed                                 General Comments: Pleasant and motivated to work with OT        Exercises      Shoulder Instructions       General Comments      Pertinent  Vitals/ Pain       Pain Assessment Pain Assessment: No/denies pain  Home Living                                          Prior Functioning/Environment              Frequency  Min 1X/week        Progress Toward Goals  OT Goals(current goals can now be found in the care plan section)  Progress towards OT goals: Progressing toward goals  Acute Rehab OT Goals Patient Stated Goal: to go to rehab OT Goal Formulation: With patient/family Time For Goal Achievement: 10/17/23 Potential to Achieve Goals: Good  Plan      Co-evaluation                 AM-PAC OT "6 Clicks" Daily Activity     Outcome Measure   Help from another person eating meals?: None Help from another person taking care of personal grooming?: A Little Help from another person toileting, which includes using toliet, bedpan, or urinal?: A Lot Help from another person bathing (including washing, rinsing, drying)?: A Lot Help from another person to put on and taking off regular upper body clothing?: A Little Help from another person to put on and taking off regular lower body clothing?: A Lot 6 Click Score: 16    End of Session Equipment Utilized During Treatment: Rolling walker (2 wheels)  OT Visit Diagnosis: Other abnormalities of gait and mobility (R26.89);Pain   Activity Tolerance Patient tolerated treatment well   Patient Left with call bell/phone within reach;with family/visitor present;with bed alarm set;in bed   Nurse Communication          Time: 7425-9563 OT Time Calculation (min): 15 min  Charges: OT General Charges $OT Visit: 1 Visit OT Treatments $Self Care/Home Management : 8-22 mins  Butch Penny, SOT

## 2023-10-06 NOTE — Discharge Summary (Addendum)
Physician Discharge Summary   Patient: Robert Burch MRN: [id number] DOB: 06-Sep-1933  Admit date:     10/02/2023  Discharge date: 10/06/23  Discharge Physician: Enedina Finner   PCP: Dorothey Baseman, MD   Recommendations at discharge:    Follow up with team at rehab F/u Orthopedic on your appt  Discharge Diagnoses: Principal Problem:   Periprosthetic fracture of proximal end of femur Active Problems:   Orthostatic hypotension   Paroxysmal atrial fibrillation (HCC)   Leukocytosis   Essential hypertension   Benign localized hyperplasia of prostate with urinary obstruction    Hospital Course: 89 year old man past medical history of atrial fibrillation on warfarin and flecainide, cardiomyopathy, hypertension GERD and osteoporosis presents after a fall.  Patient found to have a periprosthetic femur fracture.  Medical management recommended.  10/14.  With working with physical therapy yesterday the patient did have an episode where he had to sit down and sort of closes eyes for few minutes.  Patient was orthostatic afterwards and given a fluid bolus and IV fluids overnight.  His Cardizem was held and Flomax was held. 10/15.  Patient still slightly orthostatic today but able to hold his blood pressure above 110 with standing for 3 minutes.  Will continue to hold Cardizem.  Will hold Flomax today and hopefully can restart tomorrow.  In the afternoon, the patient with standing for me for 3 minutes and walking in place did not have any feelings like he was going to pass out. 10/16--assumed care--overall feels at baseline. No dizziness today. BP a bit high will resume low dose cardizem. Pt agreeable for discharge  Assessment and Plan: * Periprosthetic fracture of proximal end of femur Seen by orthopedic and recommended medical management and pain control.  Patient was able to stand up with dr Leisa Lenz and walk and place today.  Did not feel like he was going to pass out.  We did get insurance  authorization and okay to go out to rehab. Overall feels well.  Orthostatic hypotension Holding the low dose cardizem.  Will hold flomax for a another day and restart tomorrow night.  Ted hose.  Continue to work with physical therapy.  Paroxysmal atrial fibrillation (HCC) Flecainide is as needed for palpitations and/or atrial fibrillation.  Pharmacy to dose Coumadin.   Pt's BP on the higher side will resume low dose cardizem  Leukocytosis Reactive in nature  Benign localized hyperplasia of prostate with urinary obstruction Continue finasteride.  Resumed Flomax  Essential hypertension Resumed low-dose Cardizem now that BP is high and pt not feeling dizzy         Consultants: Orthopedic surgery Procedures performed: none  Disposition: Rehabilitation facility Diet recommendation:  Cardiac diet DISCHARGE MEDICATION: Allergies as of 10/06/2023       Reactions   Ciprofloxacin    Diarrhea and stomach pains   Lanoxin [digoxin]    Lopressor [metoprolol Tartrate]    Penicillins    Other reaction(s): Unknown   Sulfonamide Derivatives    Rash   Verapamil    Sulfa Antibiotics Rash        Medication List     STOP taking these medications    cefUROXime 500 MG tablet Commonly known as: CEFTIN   docusate sodium 100 MG capsule Commonly known as: COLACE   fluticasone 50 MCG/ACT nasal spray Commonly known as: FLONASE   nitrofurantoin (macrocrystal-monohydrate) 100 MG capsule Commonly known as: MACROBID       TAKE these medications    acetaminophen 325 MG tablet Commonly known  as: TYLENOL Take 2 tablets (650 mg total) by mouth every 6 (six) hours.   acidophilus Caps capsule Take 1 capsule by mouth daily.   alendronate 70 MG tablet Commonly known as: FOSAMAX Take 70 mg by mouth once a week.   bisacodyl 5 MG EC tablet Commonly known as: DULCOLAX Take 1 tablet (5 mg total) by mouth daily as needed for moderate constipation. What changed:  how much to  take how to take this reasons to take this   calcium-vitamin D 500-5 MG-MCG tablet Commonly known as: OSCAL WITH D Take 1 tablet by mouth daily.   clotrimazole-betamethasone cream Commonly known as: LOTRISONE Apply topically.   Coenzyme Q10 50 MG Caps Take 50 mg by mouth 2 (two) times daily.   Cranberry 500 MG Caps Take 500 mg by mouth daily.   diltiazem 30 MG tablet Commonly known as: CARDIZEM TAKE 1 TABLET (30 MG) BY MOUTH ONCE DAILY AT BEDTIME   finasteride 5 MG tablet Commonly known as: PROSCAR TAKE 1 TABLET (5 MG TOTAL) BY MOUTH DAILY.   Fish Oil 1000 MG Caps Take 1,000 mg by mouth daily.   Flax Seed Oil 1000 MG Caps Take 1,000 mg by mouth daily.   flecainide 100 MG tablet Commonly known as: TAMBOCOR Take 1 tablet (100 mg total) by mouth every 12 (twelve) hours as needed (atrial fibrillation or palpitations). What changed:  medication strength how much to take how to take this when to take this reasons to take this additional instructions   fluticasone-salmeterol 250-50 MCG/ACT Aepb Commonly known as: ADVAIR Inhale 1 puff into the lungs in the morning and at bedtime.   Gemtesa 75 MG Tabs Generic drug: Vibegron Take 1 tablet (75 mg total) by mouth daily.   Ginger 500 MG Caps Take 500 mg by mouth 2 (two) times daily.   HYDROcodone-acetaminophen 5-325 MG tablet Commonly known as: NORCO/VICODIN Take 1 tablet by mouth every 6 (six) hours as needed for severe pain (pain score 7-10).   hydrocortisone 25 MG suppository Commonly known as: ANUSOL-HC Place 25 mg rectally daily as needed for hemorrhoids or anal itching.   magnesium oxide 400 MG tablet Commonly known as: MAG-OX TAKE 1 TABLET BY MOUTH TWICE A DAY   multivitamin tablet Take 1 tablet by mouth daily.   pantoprazole 20 MG tablet Commonly known as: PROTONIX Take 20 mg by mouth daily.   polyethylene glycol 17 g packet Commonly known as: MIRALAX / GLYCOLAX Take 17 g by mouth daily as needed  for mild constipation or moderate constipation.   saw palmetto 160 MG capsule Take 160 mg by mouth 2 (two) times daily.   tamsulosin 0.4 MG Caps capsule Commonly known as: FLOMAX One capsule po at bedtime (start on 10/07/23) What changed: additional instructions   Vitamin D3 10 MCG (400 UNIT) Caps Take 800 Units by mouth daily.   warfarin 2.5 MG tablet Commonly known as: COUMADIN One 2.5mg  tab po at 1400pm What changed:  how much to take how to take this when to take this additional instructions        Contact information for after-discharge care     Destination     [facility] .   Service: Skilled Nursing Contact information: [address] [place] [address] [phone]                    Discharge Exam: Ceasar Mons Weights   10/04/23 1544  Weight: 85.1 kg   Physical Exam  HENT:     Head: Normocephalic.     Mouth/Throat:     Pharynx: No oropharyngeal exudate.  Eyes:     General: Lids are normal.     Conjunctiva/sclera: Conjunctivae normal.  Cardiovascular:     Rate and Rhythm: Regular rhythm. Bradycardia present.     Heart sounds: Normal heart sounds, S1 normal and S2 normal.  Pulmonary:     Breath sounds: No decreased breath sounds, wheezing, rhonchi or rales.  Abdominal:     Palpations: Abdomen is soft.     Tenderness: There is no abdominal tenderness.  Musculoskeletal:     Right lower leg: No swelling.     Left lower leg: No swelling.  Skin:    General: Skin is warm.     Findings: No rash.  Neurological:     Mental Status: He is alert and oriented to person, place, and time.      Condition at discharge: stable  The results of significant diagnostics from this hospitalization (including imaging, microbiology, ancillary and laboratory) are listed below for reference.   Imaging Studies: US RENAL  Result Date: 10/02/2023 CLINICAL DATA:  Recurrent UTIs EXAM: RENAL / URINARY TRACT ULTRASOUND COMPLETE  COMPARISON:  CT 05/10/2018 and previous FINDINGS: Right Kidney: Renal measurements: 10.5 x 5.1 x 5.2 cm = volume: 145 mL. Normal echotexture. No hydronephrosis. 2.5 cm mid pole partially exophytic cyst. 1.1 cm exophytic lower pole cyst. Left Kidney: Renal measurements: 11.2 x 5.8 x 4.8 cm = volume: 163 mL. No hydronephrosis. 1.4 cm midpole cyst. 0.8 cm lower pole cortical cyst. Bladder: Appears normal for degree of bladder distention. Bilateral ureteral jets demonstrated. Other: Prostate enlargement 5.7 x 5.9 x 5.4 cm. IMPRESSION: 1. No hydronephrosis or other acute findings. 2. Prostate enlargement. Electronically Signed   By: Corlis Leak M.D.   On: 10/02/2023 11:25   DG Hip Unilat W or Wo Pelvis 2-3 Views Right  Result Date: 10/02/2023 CLINICAL DATA:  Pain post fall EXAM: DG HIP (WITH OR WITHOUT PELVIS) 2-3V RIGHT COMPARISON:  03/16/2020 FINDINGS: Right hemiarthroplasty projects in expected location. There is a new fracture of the proximal femoral shaft adjacent to the component stem , displaced up to 3 mm. Mild left hip DJD. Lower lumbar spondylitic changes stable. IMPRESSION: Minimally displaced fracture of the proximal right femoral shaft adjacent to the hemiarthroplasty stem. Electronically Signed   By: Corlis Leak M.D.   On: 10/02/2023 11:23      Labs: CBC: Recent Labs  Lab 10/02/23 1156 10/03/23 0526 10/05/23 0606 10/06/23 0454  WBC 14.1* 8.2 7.8  --   NEUTROABS 11.8*  --   --   --   HGB 13.1 11.3* 10.2* 10.1*  HCT 39.2 34.3* 30.5*  --   MCV 84.1 85.3 85.2  --   PLT 221 191 178  --    Basic Metabolic Panel: Recent Labs  Lab 10/02/23 1156 10/05/23 0606  NA 133* 132*  K 4.4 4.6  CL 102 101  CO2 25 26  GLUCOSE 108* 120*  BUN 18 18  CREATININE 0.90 0.76  CALCIUM 8.3* 7.8*   Liver Function Tests: Recent Labs  Lab 10/02/23 1156  AST 20  ALT 13  ALKPHOS 51  BILITOT 0.8  PROT 6.9  ALBUMIN 3.6     Discharge time spent: greater than 30 minutes.  Signed: Enedina Finner,  MD [facility] 10/06/2023

## 2023-10-06 NOTE — Progress Notes (Signed)
Patient to be admitted to facility, refill of pain medication s/p femur fracture.

## 2023-10-06 NOTE — Care Management Important Message (Signed)
Important Message  Patient Details  Name: Robert Burch MRN: [id number] Date of Birth: 01/23/1933   Important Message Given:  N/A - LOS <3 / Initial given by admissions     Olegario Messier A Antoria Lanza 10/06/2023, 8:54 AM

## 2023-10-06 NOTE — Consult Note (Signed)
PHARMACY - ANTICOAGULATION CONSULT NOTE  Pharmacy Consult for Warfarin dosing Indication: atrial fibrillation  Allergies  Allergen Reactions   Ciprofloxacin     Diarrhea and stomach pains   Lanoxin [Digoxin]    Lopressor [Metoprolol Tartrate]    Penicillins     Other reaction(s): Unknown   Sulfonamide Derivatives     Rash    Verapamil    Sulfa Antibiotics Rash    Vital Signs: Temp: 98.2 F (36.8 C) (10/15 2328) BP: 154/71 (10/15 2328) Pulse Rate: 54 (10/15 2328)  Labs: Recent Labs    10/04/23 0433 10/05/23 0606 10/06/23 0454  HGB  --  10.2* 10.1*  HCT  --  30.5*  --   PLT  --  178  --   LABPROT 33.0* 30.6* 29.5*  INR 3.2* 2.9* 2.8*  CREATININE  --  0.76  --     Estimated Creatinine Clearance: 70.4 mL/min (by C-G formula based on SCr of 0.76 mg/dL).   Medical History: Past Medical History:  Diagnosis Date   Atypical chest pain    a. Nonobstructive, very minor irregs by cath 2007;  b. 06/2009 Ex MV: small area of apical lateral ischemia;  c. 06/2009 Cath: essentially normal;  d. 04/2014 Lexiscan MV: EF 59%, no ischemia/infarct->low risk.   Cancer (HCC)    Chronic anticoagulation    a. Chronic Coumadin   Chronic anxiety    Diverticulosis    Dysrhythmia    atrial fib   GERD (gastroesophageal reflux disease)    Hypertension    IBS (irritable bowel syndrome)    Nonischemic cardiomyopathy (HCC)    a. EF 45% by echo 2010;  b. 05/2012 Echo: EF 55-65%, no rwma, Gr 1 DD, mild AI/MR, mildly dil LA.   Paralyzed vocal cords    Paroxysmal atrial fibrillation (HCC)    a. Treated with flecainide   Proteinuria    History of    Medications:  Current home Warfarin dose is 2.5 mg po daily, last reported dose PTA 10/01/23.  Assessment: 89 yo male presented to ED due to a fall with closed right femoral fracture.  PMH includes Afib, HTN, HFrEF, GERD, and osteoporosis.  Pharmacy consulted to manage Warfarin dosing.  Goal of Therapy:  INR 2-3 Monitor platelets by  anticoagulation protocol: Yes    Date INR Warfarin Dose  10/12 2.5 2.5 mg   10/13 3.3 ---  10/14 3.2 ---  10/15 2.9 2.5 mg  10/16 2.8 2.5 mg    Plan:  INR remains stable within goal range of 2.0-3.0 and Hgb stable around 10. No signs/symptoms of bleeding reported. Give warfarin 2.5 mg PO x1 today Continue daily INR for now. CBC at least every 3 days.  Thank you for involving pharmacy in this patient's care.   Rockwell Alexandria, PharmD Clinical Pharmacist 10/06/2023 7:37 AM

## 2023-10-06 NOTE — Plan of Care (Signed)

## 2023-10-08 ENCOUNTER — Encounter: Payer: Self-pay | Admitting: Student

## 2023-10-08 ENCOUNTER — Non-Acute Institutional Stay (SKILLED_NURSING_FACILITY): Payer: Medicare Other | Admitting: Student

## 2023-10-08 DIAGNOSIS — I10 Essential (primary) hypertension: Secondary | ICD-10-CM

## 2023-10-08 DIAGNOSIS — N401 Enlarged prostate with lower urinary tract symptoms: Secondary | ICD-10-CM

## 2023-10-08 DIAGNOSIS — M978XXA Periprosthetic fracture around other internal prosthetic joint, initial encounter: Secondary | ICD-10-CM

## 2023-10-08 DIAGNOSIS — K219 Gastro-esophageal reflux disease without esophagitis: Secondary | ICD-10-CM

## 2023-10-08 DIAGNOSIS — I480 Paroxysmal atrial fibrillation: Secondary | ICD-10-CM | POA: Diagnosis not present

## 2023-10-08 DIAGNOSIS — N138 Other obstructive and reflux uropathy: Secondary | ICD-10-CM

## 2023-10-08 DIAGNOSIS — I951 Orthostatic hypotension: Secondary | ICD-10-CM

## 2023-10-08 DIAGNOSIS — Z96649 Presence of unspecified artificial hip joint: Secondary | ICD-10-CM

## 2023-10-08 NOTE — Progress Notes (Unsigned)
Provider:  Dr. Earnestine Mealing Location:  Other [facility].  Nursing Home Room Number: [facility] 111A Place of Service:  SNF (31)  PCP: Dorothey Baseman, MD Patient Care Team: Dorothey Baseman, MD as PCP - General (Family Medicine) Antonieta Iba, MD as PCP - Cardiology (Cardiology) Duke Salvia, MD as PCP - Electrophysiology (Cardiology) Duke Salvia, MD as Consulting Physician (Cardiology) Mariah Milling Tollie Pizza, MD as Consulting Physician (Cardiology)  Extended Emergency Contact Information Primary Emergency Contact: Zwart,Loretta Address: [address]          [place], [place] [address] [place] Home Phone: [phone] Mobile Phone: [phone] Relation: Spouse Secondary Emergency Contact: Kidd,Donald Mobile Phone: [phone] Relation: Son  Code Status: Full Code.  Goals of Care: Advanced Directive information    10/08/2023    9:11 AM  Advanced Directives  Does Patient Have a Medical Advance Directive? No  Would patient like information on creating a medical advance directive? No - Patient declined      Chief Complaint  Patient presents with  . New Admit To SNF    Admission    HPI: Patient is a 89 y.o. male seen today for admission to [facility].   He slipped off of a chair with wheels on it. He turned around and tried to get up and they called EMS when they realized he couldn't get up. At the hospital after 1 week of imaging that he had damage to the femur but no surgery at this time. Plan for continued PT. This was after 3 surgeons analyzed the photographs to defer surgery.   His pain has been moderatly controlled. He hasn't been taking the hydrocodone as much.   He had some drops in his blood pressure in the hospital as well.   He has been on Warfarin for many years. He gets labs collected every 3 weeks with goal 2.5-3.5.   He is eating well, drinking well. Having normal bowel movements. Frequent urination for which he takes  gemtesa.   He lives at home with his wife. They get some assitance from their son. Wife doesn't want him climbing so son helps with those. He sets up medications. He doesn't drive at this time, stopped about 6 months. His wife asked him to stop. He was having trouble after dark. His wife shops for groceries and he helps in the store. No ambulatory devices at home. No other falls this year. He broke his hip 03/16/20  at that time it took about ta month to get back to his normal self. A few slips since then, but no falls.     Past Medical History:  Diagnosis Date  . Atypical chest pain    a. Nonobstructive, very minor irregs by cath 2007;  b. 06/2009 Ex MV: small area of apical lateral ischemia;  c. 06/2009 Cath: essentially normal;  d. 04/2014 Lexiscan MV: EF 59%, no ischemia/infarct->low risk.  . Cancer (HCC)   . Chronic anticoagulation    a. Chronic Coumadin  . Chronic anxiety   . Diverticulosis   . Dysrhythmia    atrial fib  . GERD (gastroesophageal reflux disease)   . Hypertension   . IBS (irritable bowel syndrome)   . Nonischemic cardiomyopathy (HCC)    a. EF 45% by echo 2010;  b. 05/2012 Echo: EF 55-65%, no rwma, Gr 1 DD, mild AI/MR, mildly dil LA.  . Paralyzed vocal cords   . Paroxysmal atrial fibrillation (HCC)    a. Treated with flecainide  .  Proteinuria    History of   Past Surgical History:  Procedure Laterality Date  . CARDIAC CATHETERIZATION    . cataract surgery Bilateral   . HIP ARTHROPLASTY Right 03/17/2020   Procedure: ARTHROPLASTY BIPOLAR HIP (HEMIARTHROPLASTY);  Surgeon: Signa Kell, MD;  Location: [facility] ORS;  Service: Orthopedics;  Laterality: Right;  . MASS BIOPSY  2013    reports that he has never smoked. He has never used smokeless tobacco. He reports that he does not drink alcohol and does not use drugs. Social History   Socioeconomic History  . Marital status: Married    Spouse name: Not on file  . Number of children: 3  . Years of education: Not on file   . Highest education level: Not on file  Occupational History  . 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 Smoking status: Never  . Smokeless tobacco: Never  Vaping Use  . Vaping status: Never Used  Substance and Sexual Activity  . Alcohol use: No    Alcohol/week: 2.0 standard drinks of alcohol    Types: 1 Glasses of wine, 1 Standard drinks or equivalent per week    Comment: occassionally  . Drug use: No  . Sexual activity: Not on file  Other Topics Concern  . Not on file  Social History Narrative   Pt lives in [place] with his wife.  Retired Statistician for [employer]   Social Determinants of Health   Financial Resource Strain: Low Risk  (08/10/2023)   Received from [facility]   Overall Financial Resource Strain (CARDIA)   . Difficulty of Paying Living Expenses: Not hard at all  Food Insecurity: No Food Insecurity (10/02/2023)   Hunger Vital Sign   . Worried About Programme researcher, broadcasting/film/video in the Last Year: Never true   . Ran Out of Food in the Last Year: Never true  Transportation Needs: No Transportation Needs (10/02/2023)   PRAPARE - Transportation   . Lack of Transportation (Medical): No   . Lack of Transportation (Non-Medical): No  Physical Activity: Not on file  Stress: Not on file  Social Connections: Not on file  Intimate Partner Violence: Not At Risk (10/02/2023)   Humiliation, Afraid, Rape, and Kick questionnaire   . Fear of Current or Ex-Partner: No   . Emotionally Abused: No   . Physically Abused: No   . Sexually Abused: No    Functional Status Survey:    Family History  Problem Relation Age of Onset  . Heart attack Neg Hx     Health Maintenance  Topic Date Due  . Medicare Annual Wellness (AWV)  Never done  . Pneumonia Vaccine 60+ Years old (2 of 2 - PCV) 12/21/2009  . INFLUENZA VACCINE  07/22/2023  . COVID-19 Vaccine (5 - 2023-24 season) 08/22/2023  . DTaP/Tdap/Td (5 - Td or  Tdap) 04/25/2031  . Zoster Vaccines- Shingrix  Completed  . HPV VACCINES  Aged Out    Allergies  Allergen Reactions  . Ciprofloxacin     Diarrhea and stomach pains  . Lanoxin [Digoxin]   . Lopressor [Metoprolol Tartrate]   . Penicillins     Other reaction(s): Unknown  . Sulfonamide Derivatives     Rash   . Verapamil   . Sulfa Antibiotics Rash    Outpatient Encounter Medications as of 10/08/2023  Medication Sig  . acetaminophen (TYLENOL) 325 MG tablet Take 650 mg by mouth 4 (  four) times daily.  Marland Kitchen acidophilus (RISAQUAD) CAPS capsule Take 1 capsule by mouth daily.  Marland Kitchen alendronate (FOSAMAX) 70 MG tablet Take 70 mg by mouth once a week.  . bisacodyl (DULCOLAX) 5 MG EC tablet Take 1 tablet (5 mg total) by mouth daily as needed for moderate constipation.  . calcium-vitamin D (OSCAL WITH D) 500-5 MG-MCG tablet Take 1 tablet by mouth daily.  . Cholecalciferol (VITAMIN D3) 400 UNITS CAPS Take 800 Units by mouth daily.   . clotrimazole-betamethasone (LOTRISONE) cream Apply topically.  . Coenzyme Q10 50 MG CAPS Take 50 mg by mouth 2 (two) times daily.   . Cranberry 500 MG CAPS Take 500 mg by mouth daily.  Marland Kitchen diltiazem (CARDIZEM) 30 MG tablet TAKE 1 TABLET (30 MG) BY MOUTH ONCE DAILY AT BEDTIME  . finasteride (PROSCAR) 5 MG tablet TAKE 1 TABLET (5 MG TOTAL) BY MOUTH DAILY.  Marland Kitchen Flaxseed, Linseed, (FLAX SEED OIL) 1000 MG CAPS Take 1,000 mg by mouth daily.   . flecainide (TAMBOCOR) 100 MG tablet Take 1 tablet (100 mg total) by mouth every 12 (twelve) hours as needed (atrial fibrillation or palpitations).  . fluticasone-salmeterol (ADVAIR) 250-50 MCG/ACT AEPB Inhale 1 puff into the lungs in the morning and at bedtime.  . Ginger 500 MG CAPS Take 500 mg by mouth 2 (two) times daily.  Marland Kitchen HYDROcodone-acetaminophen (NORCO/VICODIN) 5-325 MG tablet Take 1 tablet by mouth every 6 (six) hours as needed for severe pain (pain score 7-10).  . hydrocortisone (ANUSOL-HC) 25 MG suppository Place 25 mg rectally  daily as needed for hemorrhoids or anal itching.  . magnesium oxide (MAG-OX) 400 MG tablet TAKE 1 TABLET BY MOUTH TWICE A DAY  . Multiple Vitamin (MULTIVITAMIN) tablet Take 1 tablet by mouth daily.    . Omega-3 Fatty Acids (FISH OIL) 1000 MG CAPS Take 1,000 mg by mouth daily.   . pantoprazole (PROTONIX) 20 MG tablet Take 20 mg by mouth daily.   . polyethylene glycol (MIRALAX / GLYCOLAX) packet Take 17 g by mouth daily as needed for mild constipation or moderate constipation.  . saw palmetto 160 MG capsule Take 160 mg by mouth 2 (two) times daily.  . tamsulosin (FLOMAX) 0.4 MG CAPS capsule One capsule po at bedtime (start on 10/07/23)  . Vibegron (GEMTESA) 75 MG TABS Take 1 tablet (75 mg total) by mouth daily.  Marland Kitchen warfarin (COUMADIN) 2.5 MG tablet One 2.5mg  tab po at 1400pm  . [DISCONTINUED] acetaminophen (TYLENOL) 325 MG tablet Take 2 tablets (650 mg total) by mouth every 6 (six) hours. (Patient taking differently: Take 650 mg by mouth 4 (four) times daily.)   No facility-administered encounter medications on file as of 10/08/2023.    Review of Systems  Vitals:   10/08/23 0902 10/08/23 0912  BP: (!) 150/83 133/75  Pulse: 94   Resp: (!) 22   Temp: (!) 97.5 F (36.4 C)   SpO2: 95%   Weight: 187 lb 6.4 oz (85 kg)   Height: 5\' 9"  (1.753 m)    Body mass index is 27.67 kg/m. Physical Exam  Labs reviewed: Basic Metabolic Panel: Recent Labs    10/02/23 1156 10/05/23 0606  NA 133* 132*  K 4.4 4.6  CL 102 101  CO2 25 26  GLUCOSE 108* 120*  BUN 18 18  CREATININE 0.90 0.76  CALCIUM 8.3* 7.8*   Liver Function Tests: Recent Labs    10/02/23 1156  AST 20  ALT 13  ALKPHOS 51  BILITOT 0.8  PROT 6.9  ALBUMIN 3.6   No results for input(s): "LIPASE", "AMYLASE" in the last 8760 hours. No results for input(s): "AMMONIA" in the last 8760 hours. CBC: Recent Labs    10/02/23 1156 10/03/23 0526 10/05/23 0606 10/06/23 0454  WBC 14.1* 8.2 7.8  --   NEUTROABS 11.8*  --   --   --    HGB 13.1 11.3* 10.2* 10.1*  HCT 39.2 34.3* 30.5*  --   MCV 84.1 85.3 85.2  --   PLT 221 191 178  --    Cardiac Enzymes: No results for input(s): "CKTOTAL", "CKMB", "CKMBINDEX", "TROPONINI" in the last 8760 hours. BNP: Invalid input(s): "POCBNP" No results found for: "HGBA1C" No results found for: "TSH" No results found for: "VITAMINB12" No results found for: "FOLATE" Lab Results  Component Value Date   FERRITIN 45 10/06/2023    Imaging and Procedures obtained prior to SNF admission: DG Hip Unilat W or Wo Pelvis 2-3 Views Right  Result Date: 10/02/2023 CLINICAL DATA:  Pain post fall EXAM: DG HIP (WITH OR WITHOUT PELVIS) 2-3V RIGHT COMPARISON:  03/16/2020 FINDINGS: Right hemiarthroplasty projects in expected location. There is a new fracture of the proximal femoral shaft adjacent to the component stem , displaced up to 3 mm. Mild left hip DJD. Lower lumbar spondylitic changes stable. IMPRESSION: Minimally displaced fracture of the proximal right femoral shaft adjacent to the hemiarthroplasty stem. Electronically Signed   By: Corlis Leak M.D.   On: 10/02/2023 11:23    Assessment/Plan There are no diagnoses linked to this encounter.   Family/ staff Communication:   Labs/tests ordered:

## 2023-10-21 ENCOUNTER — Ambulatory Visit: Payer: Medicare Other | Admitting: Internal Medicine

## 2023-10-27 NOTE — Progress Notes (Unsigned)
10/28/2023 8:14 AM   Robert Burch 30-Jul-1933 [id number]  Referring provider: Dorothey Baseman, MD [phone],  [place] [address]  Urological history: 1. BPH with LU TS -prostate volume 125 grams -tamsulosin 0.4 mg daily and finasteride 5 mg daily and Myrbetriq 25 mg daily  No chief complaint on file.  HPI: Robert Burch is a 89 y.o. male who presents today for follow up.    Previous records reviewed.   At his visit on August 24, 2023, he has been having burning at the end of penis since January 2024.  It comes and goes.  Patient denies any modifying or aggravating factors.  Patient denies any recent UTI's, gross hematuria or suprapubic/flank pain.  Patient denies any fevers, chills, nausea or vomiting.  UA yellow slightly cloudy, specific gravity, 1+ heme, 2+ leuks, >30 WBC's, 3-10 RBC's, 0-10 epithelial cells and moderate bacteria.  PVR 5 mL   He has also been on Myrbetriq and Gemtesa in the past for his OAB and he is not certain which of the medication is more effective.  He would like more samples of the Gemtesa.  Urine culture was positive for Klebsiella pneumonia.  He was treated with culture appropriate antibiotic.  At his visit on 09/23/2023, he states he still experiencing burning on the end of his penis, but it has improved.   Patient denies any modifying or aggravating factors.  Patient denies any recent UTI's, gross hematuria, dysuria or suprapubic/flank pain.  Patient denies any fevers, chills, nausea or vomiting.  UA grossly infected.   PVR demonstrates adequate emptying.  Urine culture was positive for Klebsiella pneumoniae.  It was treated with culture appropriate antibiotics.  Renal ultrasound was performed for further evaluation into his recurrent UTIs.  It just demonstrated bilateral renal cysts and BPH.  No hydro or stones or masses were noted.    PMH: Past Medical History:  Diagnosis Date   Atypical chest pain    a. Nonobstructive,  very minor irregs by cath 2007;  b. 06/2009 Ex MV: small area of apical lateral ischemia;  c. 06/2009 Cath: essentially normal;  d. 04/2014 Lexiscan MV: EF 59%, no ischemia/infarct->low risk.   Cancer (HCC)    Chronic anticoagulation    a. Chronic Coumadin   Chronic anxiety    Diverticulosis    Dysrhythmia    atrial fib   GERD (gastroesophageal reflux disease)    Hypertension    IBS (irritable bowel syndrome)    Nonischemic cardiomyopathy (HCC)    a. EF 45% by echo 2010;  b. 05/2012 Echo: EF 55-65%, no rwma, Gr 1 DD, mild AI/MR, mildly dil LA.   Paralyzed vocal cords    Paroxysmal atrial fibrillation (HCC)    a. Treated with flecainide   Proteinuria    History of    Surgical History: Past Surgical History:  Procedure Laterality Date   CARDIAC CATHETERIZATION     cataract surgery Bilateral    HIP ARTHROPLASTY Right 03/17/2020   Procedure: ARTHROPLASTY BIPOLAR HIP (HEMIARTHROPLASTY);  Surgeon: Robert Kell, MD;  Location: [facility] ORS;  Service: Orthopedics;  Laterality: Right;   MASS BIOPSY  2013    Home Medications:  Allergies as of 10/28/2023       Reactions   Ciprofloxacin    Diarrhea and stomach pains   Lanoxin [digoxin]    Lopressor [metoprolol Tartrate]    Penicillins    Other reaction(s): Unknown   Sulfonamide Derivatives    Rash   Verapamil  Sulfa Antibiotics Rash        Medication List        Accurate as of October 27, 2023  8:14 AM. If you have any questions, ask your nurse or doctor.          acetaminophen 325 MG tablet Commonly known as: TYLENOL Take 650 mg by mouth 4 (four) times daily.   acidophilus Caps capsule Take 1 capsule by mouth daily.   alendronate 70 MG tablet Commonly known as: FOSAMAX Take 70 mg by mouth once a week.   bisacodyl 5 MG EC tablet Commonly known as: DULCOLAX Take 1 tablet (5 mg total) by mouth daily as needed for moderate constipation.   calcium-vitamin D 500-5 MG-MCG tablet Commonly known as: OSCAL WITH D Take 1  tablet by mouth daily.   clotrimazole-betamethasone cream Commonly known as: LOTRISONE Apply topically.   Coenzyme Q10 50 MG Caps Take 50 mg by mouth 2 (two) times daily.   Cranberry 500 MG Caps Take 500 mg by mouth daily.   diltiazem 30 MG tablet Commonly known as: CARDIZEM TAKE 1 TABLET (30 MG) BY MOUTH ONCE DAILY AT BEDTIME   finasteride 5 MG tablet Commonly known as: PROSCAR TAKE 1 TABLET (5 MG TOTAL) BY MOUTH DAILY.   Fish Oil 1000 MG Caps Take 1,000 mg by mouth daily.   Flax Seed Oil 1000 MG Caps Take 1,000 mg by mouth daily.   flecainide 100 MG tablet Commonly known as: TAMBOCOR Take 1 tablet (100 mg total) by mouth every 12 (twelve) hours as needed (atrial fibrillation or palpitations).   Gemtesa 75 MG Tabs Generic drug: Vibegron Take 1 tablet (75 mg total) by mouth daily.   Ginger 500 MG Caps Take 500 mg by mouth 2 (two) times daily.   HYDROcodone-acetaminophen 5-325 MG tablet Commonly known as: NORCO/VICODIN Take 1 tablet by mouth every 6 (six) hours as needed for severe pain (pain score 7-10).   hydrocortisone 25 MG suppository Commonly known as: ANUSOL-HC Place 25 mg rectally daily as needed for hemorrhoids or anal itching.   magnesium oxide 400 MG tablet Commonly known as: MAG-OX TAKE 1 TABLET BY MOUTH TWICE A DAY   multivitamin tablet Take 1 tablet by mouth daily.   pantoprazole 20 MG tablet Commonly known as: PROTONIX Take 20 mg by mouth daily.   polyethylene glycol 17 g packet Commonly known as: MIRALAX / GLYCOLAX Take 17 g by mouth daily as needed for mild constipation or moderate constipation.   saw palmetto 160 MG capsule Take 160 mg by mouth 2 (two) times daily.   tamsulosin 0.4 MG Caps capsule Commonly known as: FLOMAX One capsule po at bedtime (start on 10/07/23)   Vitamin D3 10 MCG (400 UNIT) Caps Take 800 Units by mouth daily.   warfarin 2.5 MG tablet Commonly known as: COUMADIN One 2.5mg  tab po at 1400pm         Allergies:  Allergies  Allergen Reactions   Ciprofloxacin     Diarrhea and stomach pains   Lanoxin [Digoxin]    Lopressor [Metoprolol Tartrate]    Penicillins     Other reaction(s): Unknown   Sulfonamide Derivatives     Rash    Verapamil    Sulfa Antibiotics Rash    Family History: Family History  Problem Relation Age of Onset   Heart attack Neg Hx     Social History:  reports that he has never smoked. He has never used smokeless tobacco. He reports that he does not  drink alcohol and does not use drugs.  ROS: Pertinent ROS in HPI  Physical Exam:  Constitutional:  Well nourished. Alert and oriented, No acute distress. HEENT: [place] AT, moist mucus membranes.  Trachea midline Cardiovascular: No clubbing, cyanosis, or edema. Respiratory: Normal respiratory effort, no increased work of breathing. Neurologic: Grossly intact, no focal deficits, moving all 4 extremities. Psychiatric: Normal mood and affect.    Laboratory Data: Urinalysis Component     Latest Ref Rng 09/23/2023  Specific Gravity, UA     1.005 - 1.030  1.020   pH, UA     5.0 - 7.5  6.5   Color, UA     Yellow  Yellow   Appearance Ur     Clear  Cloudy !   Leukocytes,UA     Negative  3+ !   Protein,UA     Negative/Trace  Negative   Glucose, UA     Negative  Negative   Ketones, UA     Negative  Negative   RBC, UA     Negative  2+ !   Bilirubin, UA     Negative  Negative   Urobilinogen, Ur     0.2 - 1.0 mg/dL 0.2   Nitrite, UA     Negative  Positive !   Microscopic Examination See below:     Legend: ! Abnormal  Component     Latest Ref Rng 09/23/2023  WBC, UA     0 - 5 /hpf >30 !   Epithelial Cells (non renal)     0 - 10 /hpf 0-10   Bacteria, UA     None seen/Few  Many !   RBC, Urine     0 - 2 /hpf 11-30 !     Legend: ! Abnormal I have reviewed the labs.   Pertinent Imaging:  09/23/23 15:26  Scan Result 0 ml    Assessment & Plan:   1. rUTI's -UA with pyuria, hematuria  and bacteruria  -urine culture pending -Explained that since his urine looks like it may still have a urinary tract infection, I will go ahead and send for culture and start on the antibiotic, Macrobid  -I will also schedule renal ultrasound for further evaluation -Advised him that he can use over-the-counter AZO and that it will turn his urine orange, but he can use it for the pain  2. OAB -PVR demonstrates adequate bladder emptying -continue Gemtesa 75 mg   No follow-ups on file.  These notes generated with voice recognition software. I apologize for typographical errors.  Cloretta Ned  [facility] [address]  [address] [place], [place] [address] [phone]

## 2023-10-28 ENCOUNTER — Encounter: Payer: Self-pay | Admitting: Urology

## 2023-10-28 ENCOUNTER — Ambulatory Visit: Payer: Medicare Other | Admitting: Urology

## 2023-10-28 VITALS — BP 136/60 | HR 64 | Ht 69.0 in | Wt 187.0 lb

## 2023-10-28 DIAGNOSIS — N3281 Overactive bladder: Secondary | ICD-10-CM

## 2023-10-28 DIAGNOSIS — R3129 Other microscopic hematuria: Secondary | ICD-10-CM | POA: Diagnosis not present

## 2023-10-28 DIAGNOSIS — N4889 Other specified disorders of penis: Secondary | ICD-10-CM

## 2023-10-28 DIAGNOSIS — N390 Urinary tract infection, site not specified: Secondary | ICD-10-CM

## 2023-10-28 LAB — URINALYSIS, COMPLETE
Bilirubin, UA: NEGATIVE
Glucose, UA: NEGATIVE
Ketones, UA: NEGATIVE
Leukocytes,UA: NEGATIVE
Nitrite, UA: NEGATIVE
Specific Gravity, UA: 1.02 (ref 1.005–1.030)
Urobilinogen, Ur: 0.2 mg/dL (ref 0.2–1.0)
pH, UA: 6 (ref 5.0–7.5)

## 2023-10-28 LAB — MICROSCOPIC EXAMINATION

## 2023-10-28 LAB — PROTIME-INR: Protime: 27.5 — AB (ref 10.0–13.8)

## 2023-10-28 LAB — POCT INR: INR: 2.8 — AB (ref 0.80–1.20)

## 2023-10-29 ENCOUNTER — Telehealth: Payer: Self-pay | Admitting: Urology

## 2023-10-29 NOTE — Telephone Encounter (Signed)
Please let Robert Burch know that Dr. Richardo Hanks would like to look into his bladder and we need to get him scheduled for the cystoscopy.  [name] asked if we would try him on his home phone and his mobile phone because he doesn't know what phone he will have access to.

## 2023-10-29 NOTE — Telephone Encounter (Signed)
LM on cell and home number to contact office.

## 2023-11-01 NOTE — Telephone Encounter (Signed)
Pt returned call and would like to know if it would be O.K. if he waited til 1/16 appt to have cysto w/[facility]?

## 2023-11-01 NOTE — Telephone Encounter (Signed)
Pt aware.

## 2023-11-12 ENCOUNTER — Non-Acute Institutional Stay (INDEPENDENT_AMBULATORY_CARE_PROVIDER_SITE_OTHER): Payer: Medicare Other | Admitting: Student

## 2023-11-12 ENCOUNTER — Encounter: Payer: Self-pay | Admitting: Student

## 2023-11-12 DIAGNOSIS — Z91199 Patient's noncompliance with other medical treatment and regimen due to unspecified reason: Secondary | ICD-10-CM

## 2023-11-12 DIAGNOSIS — M978XXA Periprosthetic fracture around other internal prosthetic joint, initial encounter: Secondary | ICD-10-CM

## 2023-11-12 DIAGNOSIS — N3281 Overactive bladder: Secondary | ICD-10-CM

## 2023-11-12 NOTE — Progress Notes (Unsigned)
Location[facility]  [facility] Room Number: 111 A Place of Service:  SNF [phone])  Provider: Coralyn Helling, M.D.  PCP: Dorothey Baseman, MD Patient Care Team: Dorothey Baseman, MD as PCP - General (Family Medicine) Antonieta Iba, MD as PCP - Cardiology (Cardiology) Duke Salvia, MD as PCP - Electrophysiology (Cardiology) Duke Salvia, MD as Consulting Physician (Cardiology) Antonieta Iba, MD as Consulting Physician (Cardiology)  Extended Emergency Contact Information Primary Emergency Contact: Wrenn,Loretta Address: [address]          [place], [place] [address] [place] of [place] Home Phone: [phone] Mobile Phone: [phone] Relation: Spouse Secondary Emergency Contact: Footman,Donald Mobile Phone: [phone] Relation: Son  Code Status: Full Code  Goals of care:  Advanced Directive information    11/12/2023    1:50 PM  Advanced Directives  Does Patient Have a Medical Advance Directive? No  Would patient like information on creating a medical advance directive? No - Patient declined     Allergies  Allergen Reactions   Ciprofloxacin     Diarrhea and stomach pains   Lanoxin [Digoxin]    Lopressor [Metoprolol Tartrate]    Penicillins     Other reaction(s): Unknown   Sulfonamide Derivatives     Rash    Verapamil    Sulfa Antibiotics Rash    Chief Complaint  Patient presents with   Discharge Note    Discharge from [facility]     HPI:  89 y.o. male      Past Medical History:  Diagnosis Date   Atypical chest pain    a. Nonobstructive, very minor irregs by cath 2007;  b. 06/2009 Ex MV: small area of apical lateral ischemia;  c. 06/2009 Cath: essentially normal;  d. 04/2014 Lexiscan MV: EF 59%, no ischemia/infarct->low risk.   Cancer (HCC)    Chronic anticoagulation    a. Chronic Coumadin   Chronic anxiety    Diverticulosis    Dysrhythmia    atrial fib   GERD (gastroesophageal reflux disease)    Hypertension     IBS (irritable bowel syndrome)    Nonischemic cardiomyopathy (HCC)    a. EF 45% by echo 2010;  b. 05/2012 Echo: EF 55-65%, no rwma, Gr 1 DD, mild AI/MR, mildly dil LA.   Paralyzed vocal cords    Paroxysmal atrial fibrillation (HCC)    a. Treated with flecainide   Proteinuria    History of    Past Surgical History:  Procedure Laterality Date   CARDIAC CATHETERIZATION     cataract surgery Bilateral    HIP ARTHROPLASTY Right 03/17/2020   Procedure: ARTHROPLASTY BIPOLAR HIP (HEMIARTHROPLASTY);  Surgeon: Signa Kell, MD;  Location: [facility] ORS;  Service: Orthopedics;  Laterality: Right;   MASS BIOPSY  2013      reports that he has never smoked. He has never used smokeless tobacco. He reports that he does not drink alcohol and does not use drugs. Social History   Socioeconomic History   Marital status: Married    Spouse name: Not on file   Number of children: 3   Years of education: Not on file   Highest education level: Not on file  Occupational History   Occupation: Art gallery manager from [employer]    Employer: RETIRED    Comment: Retired  Tobacco Use   Smoking status: Never   Smokeless tobacco: Never  Vaping Use   Vaping status: Never Used  Substance and Sexual Activity   Alcohol use: No  Alcohol/week: 2.0 standard drinks of alcohol    Types: 1 Glasses of wine, 1 Standard drinks or equivalent per week    Comment: occassionally   Drug use: No   Sexual activity: Not on file  Other Topics Concern   Not on file  Social History Narrative   Pt lives in [place] with his wife.  Retired Statistician for [employer]   Social Determinants of Health   Financial Resource Strain: Low Risk  (08/10/2023)   Received from [facility]   Overall Financial Resource Strain (CARDIA)    Difficulty of Paying Living Expenses: Not hard at all  Food Insecurity: No Food Insecurity (10/02/2023)   Hunger Vital Sign    Worried About Running Out of Food in the Last Year: Never true    Ran  Out of Food in the Last Year: Never true  Transportation Needs: No Transportation Needs (10/02/2023)   PRAPARE - Administrator, Civil Service (Medical): No    Lack of Transportation (Non-Medical): No  Physical Activity: Not on file  Stress: Not on file  Social Connections: Not on file  Intimate Partner Violence: Not At Risk (10/02/2023)   Humiliation, Afraid, Rape, and Kick questionnaire    Fear of Current or Ex-Partner: No    Emotionally Abused: No    Physically Abused: No    Sexually Abused: No   Functional Status Survey:    Allergies  Allergen Reactions   Ciprofloxacin     Diarrhea and stomach pains   Lanoxin [Digoxin]    Lopressor [Metoprolol Tartrate]    Penicillins     Other reaction(s): Unknown   Sulfonamide Derivatives     Rash    Verapamil    Sulfa Antibiotics Rash    Pertinent  Health Maintenance Due  Topic Date Due   INFLUENZA VACCINE  07/22/2023    Medications: Outpatient Encounter Medications as of 11/12/2023  Medication Sig   acetaminophen (TYLENOL) 325 MG tablet Take 650 mg by mouth 4 (four) times daily.   acidophilus (RISAQUAD) CAPS capsule Take 1 capsule by mouth daily.   alendronate (FOSAMAX) 70 MG tablet Take 70 mg by mouth once a week.   alum & mag hydroxide-simeth (MAALOX PLUS) 400-400-40 MG/5ML suspension Take by mouth every 6 (six) hours as needed for indigestion.   Benzocaine-Menthol (ORAJEL 2X TOOTHACHE & GUM) 20-0.26 % GEL Use as directed in the mouth or throat every 6 (six) hours as needed.   bisacodyl (DULCOLAX) 5 MG EC tablet Take 1 tablet (5 mg total) by mouth daily as needed for moderate constipation.   calcium-vitamin D (OSCAL WITH D) 500-5 MG-MCG tablet Take 1 tablet by mouth daily.   Cholecalciferol (VITAMIN D3) 400 UNITS CAPS Take 800 Units by mouth daily.    clotrimazole-betamethasone (LOTRISONE) cream Apply 1 Application topically as needed.   Cranberry 500 MG CAPS Take 500 mg by mouth daily.   diltiazem (CARDIZEM) 30  MG tablet TAKE 1 TABLET (30 MG) BY MOUTH ONCE DAILY AT BEDTIME   finasteride (PROSCAR) 5 MG tablet TAKE 1 TABLET (5 MG TOTAL) BY MOUTH DAILY.   Flaxseed, Linseed, (FLAX SEED OIL) 1000 MG CAPS Take 1,000 mg by mouth daily.    flecainide (TAMBOCOR) 100 MG tablet Take 1 tablet (100 mg total) by mouth every 12 (twelve) hours as needed (atrial fibrillation or palpitations).   HYDROcodone-acetaminophen (NORCO/VICODIN) 5-325 MG tablet Take 1 tablet by mouth every 6 (six) hours as needed for severe pain (pain score 7-10).  hydrocortisone (ANUSOL-HC) 25 MG suppository Place 25 mg rectally daily as needed for hemorrhoids or anal itching.   lidocaine 4 % Place 1 patch onto the skin. As needed   magnesium oxide (MAG-OX) 400 MG tablet TAKE 1 TABLET BY MOUTH TWICE A DAY   Multiple Vitamin (MULTIVITAMIN) tablet Take 1 tablet by mouth daily.     Omega-3 Fatty Acids (FISH OIL) 1000 MG CAPS Take 1,000 mg by mouth daily.    polyethylene glycol (MIRALAX / GLYCOLAX) packet Take 17 g by mouth daily as needed for mild constipation or moderate constipation.   tamsulosin (FLOMAX) 0.4 MG CAPS capsule One capsule po at bedtime (start on 10/07/23)   Vibegron (GEMTESA) 75 MG TABS Take 1 tablet (75 mg total) by mouth daily.   warfarin (COUMADIN) 2.5 MG tablet One 2.5mg  tab po at 1400pm   No facility-administered encounter medications on file as of 11/12/2023.    Review of Systems  Vitals:   11/12/23 1142 11/12/23 1143  BP: (!) 144/77 123/71  Pulse: 65   Temp: 97.8 F (36.6 C)   SpO2: 96%   Weight: 190 lb (86.2 kg)   Height: 5\' 9"  (1.753 m)    Body mass index is 28.06 kg/m. Physical Exam  Labs reviewed: Basic Metabolic Panel: Recent Labs    10/02/23 1156 10/05/23 0606  NA 133* 132*  K 4.4 4.6  CL 102 101  CO2 25 26  GLUCOSE 108* 120*  BUN 18 18  CREATININE 0.90 0.76  CALCIUM 8.3* 7.8*   Liver Function Tests: Recent Labs    10/02/23 1156  AST 20  ALT 13  ALKPHOS 51  BILITOT 0.8  PROT 6.9   ALBUMIN 3.6   No results for input(s): "LIPASE", "AMYLASE" in the last 8760 hours. No results for input(s): "AMMONIA" in the last 8760 hours. CBC: Recent Labs    10/02/23 1156 10/03/23 0526 10/05/23 0606 10/06/23 0454  WBC 14.1* 8.2 7.8  --   NEUTROABS 11.8*  --   --   --   HGB 13.1 11.3* 10.2* 10.1*  HCT 39.2 34.3* 30.5*  --   MCV 84.1 85.3 85.2  --   PLT 221 191 178  --    Cardiac Enzymes: No results for input(s): "CKTOTAL", "CKMB", "CKMBINDEX", "TROPONINI" in the last 8760 hours. BNP: Invalid input(s): "POCBNP" CBG: No results for input(s): "GLUCAP" in the last 8760 hours.  Procedures and Imaging Studies During Stay: No results found.  Assessment/Plan:   1. Periprosthetic fracture of proximal end of femur ***  2. OAB (overactive bladder) ***    Patient is being discharged with the following home health services:    Patient is being discharged with the following durable medical equipment:    Patient has been advised to f/u with their PCP in 1-2 weeks to for a transitions of care visit.  Social services at their facility was responsible for arranging this appointment.  Pt was provided with adequate prescriptions of noncontrolled medications to reach the scheduled appointment .  For controlled substances, a limited supply was provided as appropriate for the individual patient.  If the pt normally receives these medications from a pain clinic or has a contract with another physician, these medications should be received from that clinic or physician only).    Future labs/tests needed:  ***

## 2023-11-14 ENCOUNTER — Encounter: Payer: Self-pay | Admitting: Student

## 2023-11-15 LAB — PROTIME-INR: Protime: 19.3 — AB (ref 10.0–13.8)

## 2023-11-15 LAB — POCT INR: INR: 1.9 — AB (ref 0.80–1.20)

## 2023-11-16 ENCOUNTER — Encounter: Payer: Self-pay | Admitting: Nurse Practitioner

## 2023-11-16 ENCOUNTER — Non-Acute Institutional Stay (SKILLED_NURSING_FACILITY): Payer: Medicare Other | Admitting: Nurse Practitioner

## 2023-11-16 DIAGNOSIS — N3281 Overactive bladder: Secondary | ICD-10-CM | POA: Diagnosis not present

## 2023-11-16 DIAGNOSIS — D6869 Other thrombophilia: Secondary | ICD-10-CM

## 2023-11-16 DIAGNOSIS — M978XXA Periprosthetic fracture around other internal prosthetic joint, initial encounter: Secondary | ICD-10-CM

## 2023-11-16 DIAGNOSIS — Z96649 Presence of unspecified artificial hip joint: Secondary | ICD-10-CM

## 2023-11-16 DIAGNOSIS — N401 Enlarged prostate with lower urinary tract symptoms: Secondary | ICD-10-CM | POA: Diagnosis not present

## 2023-11-16 DIAGNOSIS — M810 Age-related osteoporosis without current pathological fracture: Secondary | ICD-10-CM | POA: Diagnosis not present

## 2023-11-16 DIAGNOSIS — I10 Essential (primary) hypertension: Secondary | ICD-10-CM

## 2023-11-16 DIAGNOSIS — I480 Paroxysmal atrial fibrillation: Secondary | ICD-10-CM

## 2023-11-16 DIAGNOSIS — N138 Other obstructive and reflux uropathy: Secondary | ICD-10-CM

## 2023-11-16 MED ORDER — FINASTERIDE 5 MG PO TABS
5.0000 mg | ORAL_TABLET | Freq: Every day | ORAL | 0 refills | Status: DC
Start: 2023-11-16 — End: 2024-01-06

## 2023-11-16 MED ORDER — WARFARIN SODIUM 2.5 MG PO TABS
ORAL_TABLET | ORAL | 0 refills | Status: DC
Start: 2023-11-16 — End: 2024-10-29

## 2023-11-16 MED ORDER — WARFARIN SODIUM 2 MG PO TABS
ORAL_TABLET | ORAL | 0 refills | Status: DC
Start: 2023-11-16 — End: 2024-01-06

## 2023-11-16 MED ORDER — GEMTESA 75 MG PO TABS
75.0000 mg | ORAL_TABLET | Freq: Every day | ORAL | 0 refills | Status: DC
Start: 2023-11-16 — End: 2024-01-06

## 2023-11-16 MED ORDER — TAMSULOSIN HCL 0.4 MG PO CAPS
ORAL_CAPSULE | ORAL | 0 refills | Status: DC
Start: 2023-11-16 — End: 2024-01-06

## 2023-11-16 MED ORDER — ALENDRONATE SODIUM 70 MG PO TABS: 70.0000 mg | ORAL_TABLET | ORAL | 0 refills | Status: AC

## 2023-11-16 MED ORDER — DILTIAZEM HCL 30 MG PO TABS
ORAL_TABLET | ORAL | 0 refills | Status: DC
Start: 2023-11-16 — End: 2024-01-04

## 2023-11-16 MED ORDER — FLECAINIDE ACETATE 100 MG PO TABS: 100.0000 mg | ORAL_TABLET | Freq: Two times a day (BID) | ORAL | 0 refills | Status: DC | PRN

## 2023-11-16 NOTE — Progress Notes (Signed)
Location:  Other [facility]) Nursing Home Room Number: 111 A Place of Service:  SNF [phone])  Provider: Janene Harvey. Janyth Contes, NP   PCP: Dorothey Baseman, MD Patient Care Team: Dorothey Baseman, MD as PCP - General (Family Medicine) Antonieta Iba, MD as PCP - Cardiology (Cardiology) Duke Salvia, MD as PCP - Electrophysiology (Cardiology) Duke Salvia, MD as Consulting Physician (Cardiology) Antonieta Iba, MD as Consulting Physician (Cardiology)  Extended Emergency Contact Information Primary Emergency Contact: Cassaday,Loretta Address: [address]          [place], [place] [address] D[place] of [place] Home Phone: [phone] Mobile Phone: [phone] Relation: Spouse Secondary Emergency Contact: Graver,Donald Mobile Phone: [phone] Relation: Son  Code Status: Full code  Goals of care:  Advanced Directive information    11/16/2023   12:56 PM  Advanced Directives  Does Patient Have a Medical Advance Directive? No  Would patient like information on creating a medical advance directive? No - Patient declined     Allergies  Allergen Reactions   Ciprofloxacin     Diarrhea and stomach pains   Lanoxin [Digoxin]    Lopressor [Metoprolol Tartrate]    Penicillins     Other reaction(s): Unknown   Sulfonamide Derivatives     Rash    Verapamil    Sulfa Antibiotics Rash    Chief Complaint  Patient presents with   Discharge Note    Discharge from [facility]     HPI:  89 y.o. male  for discharge home.  He is at [facility] for rehab after nondisplaced proximal femoral tuberosity fracture for medical management.  Pain is minimal when sitting (1/10), when he walks it is 4/10.  Controlled on tylenol. Has not needed any other medication   Afib- reports his INR is managed by PCP. He has been on couamdin 2.5 mg "forever" but got high on 2.5 mg at facility and coumadin was decreased to 2 mg, he has been in theraputic range for about a month however INR  yesterday was  1.9 Generally checked every 3 weeks  No abnormal bruising or bleeding.   No constipation. Eating well. Feels stronger and ready for discharge home.   On fosamax due to osteoporosis.   No GERD noted   Past Medical History:  Diagnosis Date   Atypical chest pain    a. Nonobstructive, very minor irregs by cath 2007;  b. 06/2009 Ex MV: small area of apical lateral ischemia;  c. 06/2009 Cath: essentially normal;  d. 04/2014 Lexiscan MV: EF 59%, no ischemia/infarct->low risk.   Cancer (HCC)    Chronic anticoagulation    a. Chronic Coumadin   Chronic anxiety    Diverticulosis    Dysrhythmia    atrial fib   GERD (gastroesophageal reflux disease)    Hypertension    IBS (irritable bowel syndrome)    Nonischemic cardiomyopathy (HCC)    a. EF 45% by echo 2010;  b. 05/2012 Echo: EF 55-65%, no rwma, Gr 1 DD, mild AI/MR, mildly dil LA.   Paralyzed vocal cords    Paroxysmal atrial fibrillation (HCC)    a. Treated with flecainide   Proteinuria    History of    Past Surgical History:  Procedure Laterality Date   CARDIAC CATHETERIZATION     cataract surgery Bilateral    HIP ARTHROPLASTY Right 03/17/2020   Procedure: ARTHROPLASTY BIPOLAR HIP (HEMIARTHROPLASTY);  Surgeon: Signa Kell, MD;  Location: [facility] ORS;  Service: Orthopedics;  Laterality: Right;   MASS BIOPSY  2013      reports that he has never smoked. He has never used smokeless tobacco. He reports that he does not drink alcohol and does not use drugs. Social History   Socioeconomic History   Marital status: Married    Spouse name: Not on file   Number of children: 3   Years of education: Not on file   Highest education level: Not on file  Occupational History   Occupation: Art gallery manager from [employer]    Employer: RETIRED    Comment: Retired  Tobacco Use   Smoking status: Never   Smokeless tobacco: Never  Vaping Use   Vaping status: Never Used  Substance and Sexual Activity   Alcohol use: No    Alcohol/week: 2.0  standard drinks of alcohol    Types: 1 Glasses of wine, 1 Standard drinks or equivalent per week    Comment: occassionally   Drug use: No   Sexual activity: Not on file  Other Topics Concern   Not on file  Social History Narrative   Pt lives in [place] with his wife.  Retired Statistician for [employer]   Social Determinants of Health   Financial Resource Strain: Low Risk  (08/10/2023)   Received from [facility]   Overall Financial Resource Strain (CARDIA)    Difficulty of Paying Living Expenses: Not hard at all  Food Insecurity: No Food Insecurity (10/02/2023)   Hunger Vital Sign    Worried About Running Out of Food in the Last Year: Never true    Ran Out of Food in the Last Year: Never true  Transportation Needs: No Transportation Needs (10/02/2023)   PRAPARE - Administrator, Civil Service (Medical): No    Lack of Transportation (Non-Medical): No  Physical Activity: Not on file  Stress: Not on file  Social Connections: Not on file  Intimate Partner Violence: Not At Risk (10/02/2023)   Humiliation, Afraid, Rape, and Kick questionnaire    Fear of Current or Ex-Partner: No    Emotionally Abused: No    Physically Abused: No    Sexually Abused: No   Functional Status Survey:    Allergies  Allergen Reactions   Ciprofloxacin     Diarrhea and stomach pains   Lanoxin [Digoxin]    Lopressor [Metoprolol Tartrate]    Penicillins     Other reaction(s): Unknown   Sulfonamide Derivatives     Rash    Verapamil    Sulfa Antibiotics Rash    Pertinent  Health Maintenance Due  Topic Date Due   INFLUENZA VACCINE  03/20/2024 (Originally 07/22/2023)    Medications: Outpatient Encounter Medications as of 11/16/2023  Medication Sig   acetaminophen (TYLENOL) 325 MG tablet Take 650 mg by mouth 4 (four) times daily.   acidophilus (RISAQUAD) CAPS capsule Take 1 capsule by mouth daily.   alendronate (FOSAMAX) 70 MG tablet Take 70 mg by mouth once a  week.   alum & mag hydroxide-simeth (MAALOX PLUS) 400-400-40 MG/5ML suspension Take 30 mLs by mouth every 4 (four) hours as needed for indigestion.   Benzocaine-Menthol (ORAJEL 2X TOOTHACHE & GUM) 20-0.26 % GEL Use as directed in the mouth or throat every 6 (six) hours as needed.   bisacodyl (DULCOLAX) 5 MG EC tablet Take 1 tablet (5 mg total) by mouth daily as needed for moderate constipation.   calcium-vitamin D (OSCAL WITH D) 500-5 MG-MCG tablet Take 1 tablet by mouth daily.   Cholecalciferol (VITAMIN D3) 400 UNITS CAPS  Take 800 Units by mouth daily.    clotrimazole-betamethasone (LOTRISONE) cream Apply 1 Application topically as needed.   Cranberry 500 MG CAPS Take 500 mg by mouth daily.   diltiazem (CARDIZEM) 30 MG tablet TAKE 1 TABLET (30 MG) BY MOUTH ONCE DAILY AT BEDTIME   finasteride (PROSCAR) 5 MG tablet TAKE 1 TABLET (5 MG TOTAL) BY MOUTH DAILY.   Flaxseed, Linseed, (FLAX SEED OIL) 1000 MG CAPS Take 1,000 mg by mouth daily.    flecainide (TAMBOCOR) 100 MG tablet Take 1 tablet (100 mg total) by mouth every 12 (twelve) hours as needed (atrial fibrillation or palpitations).   HYDROcodone-acetaminophen (NORCO/VICODIN) 5-325 MG tablet Take 1 tablet by mouth every 6 (six) hours as needed for severe pain (pain score 7-10).   hydrocortisone (ANUSOL-HC) 25 MG suppository Place 25 mg rectally daily as needed for hemorrhoids or anal itching.   lidocaine 4 % Place 1 patch onto the skin. As needed   magnesium oxide (MAG-OX) 400 MG tablet TAKE 1 TABLET BY MOUTH TWICE A DAY   Multiple Vitamin (MULTIVITAMIN) tablet Take 1 tablet by mouth daily.     Omega-3 Fatty Acids (FISH OIL) 1000 MG CAPS Take 1,000 mg by mouth daily.    polyethylene glycol (MIRALAX / GLYCOLAX) packet Take 17 g by mouth daily as needed for mild constipation or moderate constipation.   tamsulosin (FLOMAX) 0.4 MG CAPS capsule One capsule po at bedtime (start on 10/07/23)   Vibegron (GEMTESA) 75 MG TABS Take 1 tablet (75 mg total) by  mouth daily.   warfarin (COUMADIN) 2 MG tablet Take 2 mg by mouth daily. In the afternoon   warfarin (COUMADIN) 2.5 MG tablet One 2.5mg  tab po at 1400pm (Patient not taking: Reported on 11/16/2023)   No facility-administered encounter medications on file as of 11/16/2023.    Review of Systems  Constitutional:  Negative for activity change, appetite change, fatigue and unexpected weight change.  HENT:  Negative for congestion and hearing loss.   Eyes: Negative.   Respiratory:  Negative for cough and shortness of breath.   Cardiovascular:  Negative for chest pain, palpitations and leg swelling.  Gastrointestinal:  Negative for abdominal pain, constipation and diarrhea.  Genitourinary:  Negative for difficulty urinating and dysuria.  Musculoskeletal:  Positive for arthralgias. Negative for myalgias.  Skin:  Negative for color change and wound.  Neurological:  Negative for dizziness and weakness.  Psychiatric/Behavioral:  Negative for agitation, behavioral problems and confusion.     Vitals:   11/16/23 1254 11/16/23 1307  BP: (!) 161/82 132/71  Pulse: (!) 55   Temp: 97.9 F (36.6 C)   Weight: 190 lb (86.2 kg)   Height: 5\' 9"  (1.753 m)    Body mass index is 28.06 kg/m. Physical Exam Constitutional:      General: He is not in acute distress.    Appearance: He is well-developed. He is not diaphoretic.  HENT:     Head: Normocephalic and atraumatic.     Right Ear: External ear normal.     Left Ear: External ear normal.     Mouth/Throat:     Pharynx: No oropharyngeal exudate.  Eyes:     Conjunctiva/sclera: Conjunctivae normal.     Pupils: Pupils are equal, round, and reactive to light.  Cardiovascular:     Rate and Rhythm: Normal rate and regular rhythm.     Heart sounds: Normal heart sounds.  Pulmonary:     Effort: Pulmonary effort is normal.     Breath sounds:  Normal breath sounds.  Abdominal:     General: Bowel sounds are normal.     Palpations: Abdomen is soft.   Musculoskeletal:        General: No tenderness.     Cervical back: Normal range of motion and neck supple.     Right lower leg: No edema.     Left lower leg: No edema.  Skin:    General: Skin is warm and dry.  Neurological:     Mental Status: He is alert and oriented to person, place, and time.     Gait: Gait abnormal.     Labs reviewed: Basic Metabolic Panel: Recent Labs    10/02/23 1156 10/05/23 0606  NA 133* 132*  K 4.4 4.6  CL 102 101  CO2 25 26  GLUCOSE 108* 120*  BUN 18 18  CREATININE 0.90 0.76  CALCIUM 8.3* 7.8*   Liver Function Tests: Recent Labs    10/02/23 1156  AST 20  ALT 13  ALKPHOS 51  BILITOT 0.8  PROT 6.9  ALBUMIN 3.6   No results for input(s): "LIPASE", "AMYLASE" in the last 8760 hours. No results for input(s): "AMMONIA" in the last 8760 hours. CBC: Recent Labs    10/02/23 1156 10/03/23 0526 10/05/23 0606 10/06/23 0454  WBC 14.1* 8.2 7.8  --   NEUTROABS 11.8*  --   --   --   HGB 13.1 11.3* 10.2* 10.1*  HCT 39.2 34.3* 30.5*  --   MCV 84.1 85.3 85.2  --   PLT 221 191 178  --    Cardiac Enzymes: No results for input(s): "CKTOTAL", "CKMB", "CKMBINDEX", "TROPONINI" in the last 8760 hours. BNP: Invalid input(s): "POCBNP" CBG: No results for input(s): "GLUCAP" in the last 8760 hours.  Procedures and Imaging Studies During Stay: No results found.  Assessment/Plan:   1. Periprosthetic fracture of proximal end of femur -doing well with medical management, pain has slowly improved, continue home health Pt/OT on discharge with orthopedic follow up.   2. Age related osteoporosis, unspecified pathological fracture presence -continue home regimen - alendronate (FOSAMAX) 70 MG tablet; Take 1 tablet (70 mg total) by mouth once a week.  Dispense: 4 tablet; Refill: 0 - warfarin (COUMADIN) 2.5 MG tablet; By mouth daily on tuesdays  Dispense: 4 tablet; Refill: 0  3. OAB (overactive bladder) -stable, continues to follow up with urology -  Vibegron (GEMTESA) 75 MG TABS; Take 1 tablet (75 mg total) by mouth daily.  Dispense: 30 tablet; Refill: 0  4. Benign localized hyperplasia of prostate with urinary obstruction -stable, continues to follow up with urology - tamsulosin (FLOMAX) 0.4 MG CAPS capsule; One capsule po at bedtime (start on 10/07/23)  Dispense: 30 capsule; Refill: 0 - finasteride (PROSCAR) 5 MG tablet; Take 1 tablet (5 mg total) by mouth daily.  Dispense: 30 tablet; Refill: 0  5. Essential hypertension -Blood pressure well controlled, goal bp <140/90 Continue current medications and dietary modifications follow metabolic panel  6. Paroxysmal atrial fibrillation (HCC) Rate controlled at this time  - warfarin (COUMADIN) 2 MG tablet; Daily by mouth except for Tuesday  Dispense: 30 tablet; Refill: 0 - flecainide (TAMBOCOR) 100 MG tablet; Take 1 tablet (100 mg total) by mouth every 12 (twelve) hours as needed (atrial fibrillation or palpitations).  Dispense: 60 tablet; Refill: 0 - diltiazem (CARDIZEM) 30 MG tablet; Take 1 tablet (30 mg) by mouth once daily at bedtime  Dispense: 30 tablet; Refill: 0  7. Acquired hypercoagulable state (HCC) Due to afib,  INR 1.9 which is below therapeutic range, when he was on 2.5 mg daily INR too high. Will have him take coumadin 2.5 mg daily on Tuesday and coumadin 2 mg daily on all other days.  Will need INR follow up by PCP on follow up in 2 weeks.    Patient is being discharged with the following home health services:  Pt/ot  Patient is being discharged with the following durable medical equipment:  FWW  Patient has been advised to f/u with their PCP in 1-2 weeks to for a transitions of care visit.  Social services at their facility was responsible for arranging this appointment.  Pt was provided with adequate prescriptions of noncontrolled medications to reach the scheduled appointment .  For controlled substances, a limited supply was provided as appropriate for the individual  patient.  If the pt normally receives these medications from a pain clinic or has a contract with another physician, these medications should be received from that clinic or physician only).    Future labs/tests needed:  INR

## 2023-11-30 ENCOUNTER — Other Ambulatory Visit: Payer: Self-pay | Admitting: Nurse Practitioner

## 2023-11-30 DIAGNOSIS — M810 Age-related osteoporosis without current pathological fracture: Secondary | ICD-10-CM

## 2023-11-30 DIAGNOSIS — I480 Paroxysmal atrial fibrillation: Secondary | ICD-10-CM

## 2023-12-20 ENCOUNTER — Encounter: Payer: Self-pay | Admitting: Internal Medicine

## 2023-12-20 ENCOUNTER — Ambulatory Visit: Payer: Medicare Other | Attending: Internal Medicine | Admitting: Internal Medicine

## 2023-12-20 VITALS — BP 180/54 | HR 59 | Ht 69.0 in | Wt 185.0 lb

## 2023-12-20 DIAGNOSIS — I480 Paroxysmal atrial fibrillation: Secondary | ICD-10-CM

## 2023-12-20 DIAGNOSIS — I491 Atrial premature depolarization: Secondary | ICD-10-CM

## 2023-12-20 DIAGNOSIS — I493 Ventricular premature depolarization: Secondary | ICD-10-CM

## 2023-12-20 DIAGNOSIS — R001 Bradycardia, unspecified: Secondary | ICD-10-CM | POA: Diagnosis not present

## 2023-12-20 DIAGNOSIS — I951 Orthostatic hypotension: Secondary | ICD-10-CM

## 2023-12-20 DIAGNOSIS — Z79899 Other long term (current) drug therapy: Secondary | ICD-10-CM

## 2023-12-20 NOTE — Progress Notes (Signed)
Patient ID: Robert Burch, male   DOB: [birth year], 89 y.o.   MRN: [id number]      Patient Care Team: Dorothey Baseman, MD as PCP - General (Family Medicine) Antonieta Iba, MD as PCP - Cardiology (Cardiology) Duke Salvia, MD as PCP - Electrophysiology (Cardiology) Duke Salvia, MD as Consulting Physician (Cardiology) Antonieta Iba, MD as Consulting Physician (Cardiology)   HPI  Robert Burch is a 89 y.o. male Seen in followup for paroxysmal atrial fibrillation in the setting of nonischemic coronary disease; Managed with flecainide taking regularly, but more recently prn because of concerned that it was contributing to dyspnea Previously referred to Dr Tommie Sams for consideration of pulm vein isolation; pt declined ablation Anticoagulation with warfarin  .  Developed bradycardia with heart rates in the 40s and modest chronotropic incompetence.    .  Symptomatic orthostatic hypotension   3/22 discussed nonpharmacological therapies, abdominal binder at night (abdominal binder was causing constipation when worn during the day) and thigh sleeves in the morning 7/22 daytime hypotension question postprandial hypotension and at that time had given him UP-TO-DATE patient information describing isometric maneuvers.  These and the thigh sleeves have markedly attenuated his symptoms.  Hospitalized 10/24 with a periprosthetic fracture of his proximal femur around his prior hip arthroplasty hospitalization notable for symptomatic orthostasis. Chart refers to both left-sided and right-sided fractures "status post left and right total hip arthroplasties with recent right periprosthetic greater trochanteric fracture radiograph 09-22-2023 and new left proximal anterior femur periprosthetic femur fracture around the total hip arthroplasty."  GR [place] 10/03/23  The patient denies nocturnal dyspnea, orthopnea or peripheral edema.  There have been no palpitations or syncope.   Complains of chest pain for the last 2 months or so.  Typically midsternal lasting 5 minutes or so occurring almost fully when he is supine.  Review of the medical record demonstrates that he has been on for pantoprazole for a long time.  And was discharged on it following his hospitalization 10/24 but was not discharged on it following his discharge from [facility].  Continues with dyspnea on exertion which is modest.  Has known chronotropic incompetence.  No chest discomfort with effort.  Some lightheadedness.  The fall that precipitated his periprosthetic fracture was apparently missing a chair while he was sitting down.  Brings in blood pressure record from home, in the 120-160 range of mostly in the 130-40 range DATE TEST EF   7/16    Echo    55-60 % Mild LAE  7/16    myoview   35 % No ischemia-gut artifact   11/21 Echo 55-60%     Date Cr K Hgb  5/19 1.0 5.2 13.1`  8/20 0.9 4.4 13.3  4/21 0.97 4.9 11.1  2/22 1.0 4.5 12.2  8/22   12.8  8/23 0.9 4.9 13.1  10/24 0.76 4.6 10.1    Records and Results Reviewed notes from Dr. Knute Neu Holter monitors  Past Medical History:  Diagnosis Date   Atypical chest pain    a. Nonobstructive, very minor irregs by cath 2007;  b. 06/2009 Ex MV: small area of apical lateral ischemia;  c. 06/2009 Cath: essentially normal;  d. 04/2014 Lexiscan MV: EF 59%, no ischemia/infarct->low risk.   Cancer (HCC)    Chronic anticoagulation    a. Chronic Coumadin   Chronic anxiety    Diverticulosis    Dysrhythmia    atrial fib   GERD (gastroesophageal reflux disease)    Hypertension  IBS (irritable bowel syndrome)    Nonischemic cardiomyopathy (HCC)    a. EF 45% by echo 2010;  b. 05/2012 Echo: EF 55-65%, no rwma, Gr 1 DD, mild AI/MR, mildly dil LA.   Paralyzed vocal cords    Paroxysmal atrial fibrillation (HCC)    a. Treated with flecainide   Proteinuria    History of    Past Surgical History:  Procedure Laterality Date   CARDIAC CATHETERIZATION      cataract surgery Bilateral    HIP ARTHROPLASTY Right 03/17/2020   Procedure: ARTHROPLASTY BIPOLAR HIP (HEMIARTHROPLASTY);  Surgeon: Signa Kell, MD;  Location: [facility] ORS;  Service: Orthopedics;  Laterality: Right;   MASS BIOPSY  2013    Current Outpatient Medications  Medication Sig Dispense Refill   acetaminophen (TYLENOL) 325 MG tablet Take 650 mg by mouth 4 (four) times daily.     acidophilus (RISAQUAD) CAPS capsule Take 1 capsule by mouth daily.     alendronate (FOSAMAX) 70 MG tablet Take 1 tablet (70 mg total) by mouth once a week. 4 tablet 0   alum & mag hydroxide-simeth (MAALOX PLUS) 400-400-40 MG/5ML suspension Take 30 mLs by mouth every 4 (four) hours as needed for indigestion.     Benzocaine-Menthol (ORAJEL 2X TOOTHACHE & GUM) 20-0.26 % GEL Use as directed in the mouth or throat every 6 (six) hours as needed.     bisacodyl (DULCOLAX) 5 MG EC tablet Take 1 tablet (5 mg total) by mouth daily as needed for moderate constipation.     calcium-vitamin D (OSCAL WITH D) 500-5 MG-MCG tablet Take 1 tablet by mouth daily. 30 tablet 0   Cholecalciferol (VITAMIN D3) 400 UNITS CAPS Take 800 Units by mouth daily.      clotrimazole-betamethasone (LOTRISONE) cream Apply 1 Application topically as needed.     Cranberry 500 MG CAPS Take 500 mg by mouth daily.     diltiazem (CARDIZEM) 30 MG tablet Take 1 tablet (30 mg) by mouth once daily at bedtime 30 tablet 0   finasteride (PROSCAR) 5 MG tablet Take 1 tablet (5 mg total) by mouth daily. 30 tablet 0   Flaxseed, Linseed, (FLAX SEED OIL) 1000 MG CAPS Take 1,000 mg by mouth daily.      flecainide (TAMBOCOR) 100 MG tablet Take 1 tablet (100 mg total) by mouth every 12 (twelve) hours as needed (atrial fibrillation or palpitations). 60 tablet 0   HYDROcodone-acetaminophen (NORCO/VICODIN) 5-325 MG tablet Take 1 tablet by mouth every 6 (six) hours as needed for severe pain (pain score 7-10). 10 tablet 0   hydrocortisone (ANUSOL-HC) 25 MG suppository Place 25 mg  rectally daily as needed for hemorrhoids or anal itching.     lidocaine 4 % Place 1 patch onto the skin. As needed     magnesium oxide (MAG-OX) 400 MG tablet TAKE 1 TABLET BY MOUTH TWICE A DAY 60 tablet 1   Multiple Vitamin (MULTIVITAMIN) tablet Take 1 tablet by mouth daily.       Omega-3 Fatty Acids (FISH OIL) 1000 MG CAPS Take 1,000 mg by mouth daily.      polyethylene glycol (MIRALAX / GLYCOLAX) packet Take 17 g by mouth daily as needed for mild constipation or moderate constipation.     tamsulosin (FLOMAX) 0.4 MG CAPS capsule One capsule po at bedtime (start on 10/07/23) 30 capsule 0   Vibegron (GEMTESA) 75 MG TABS Take 1 tablet (75 mg total) by mouth daily. 30 tablet 0   warfarin (COUMADIN) 2 MG tablet Daily by mouth  except for Tuesday 30 tablet 0   warfarin (COUMADIN) 2.5 MG tablet One 2.5mg  tab po at 1400pm     warfarin (COUMADIN) 2.5 MG tablet By mouth daily on tuesdays 4 tablet 0   No current facility-administered medications for this visit.    Allergies  Allergen Reactions   Ciprofloxacin     Diarrhea and stomach pains   Lanoxin [Digoxin]    Lopressor [Metoprolol Tartrate]    Penicillins     Other reaction(s): Unknown   Sulfonamide Derivatives     Rash    Verapamil    Sulfa Antibiotics Rash    Review of Systems negative except from HPI and PMH  Physical Exam: BP (!) 180/54 (BP Location: Left Arm, Patient Position: Sitting, Cuff Size: Normal)   Pulse (!) 59   Ht 5\' 9"  (1.753 m)   Wt 185 lb (83.9 kg)   SpO2 98%   BMI 27.32 kg/m   Well developed and well nourished in no acute distress HENT normal Neck supple   Clear Regular rate and rhythm, no  gallop No murmur Abd-soft with active BS No Clubbing cyanosis  edema Skin-warm and dry A & Oriented  Grossly normal sensory and motor function  ECG sinus at 59 Interval 15/09/41   Walk test: 2 flights of stairs, heart rate 59--69  Assessment and  Plan  Atrial fibrillation-paroxysmal  PACs/PVCs  Sinus  bradycardia chronotropic incompetence  Hypertension  Orthostatic hypotension   Blood pressure is elevated here; has been on repeatedly in this range in the office.  Blood pressures from home are in the 120-40 range.  Will have him check his cuff with his PCP.  I am more concerned about falls then about systolic hypertension.  His head of bed is already elevated a few inches; nocturnal blood pressure therapy is also fraught with risk as he gets up 4+ times to urinate at night.  No significant atrial fib of which he is aware

## 2023-12-20 NOTE — Patient Instructions (Signed)
Medication Instructions:  Your physician recommends that you continue on your current medications as directed. Please refer to the Current Medication list given to you today.  *If you need a refill on your cardiac medications before your next appointment, please call your pharmacy*   Lab Work: CBC today If you have labs (blood work) drawn today and your tests are completely normal, you will receive your results only by: MyChart Message (if you have MyChart) OR A paper copy in the mail If you have any lab test that is abnormal or we need to change your treatment, we will call you to review the results.   Testing/Procedures: None ordered.    Follow-Up: At [facility], you and your health needs are our priority.  As part of our continuing mission to provide you with exceptional heart care, we have created designated Provider Care Teams.  These Care Teams include your primary Cardiologist (physician) and Advanced Practice Providers (APPs -  Physician Assistants and Nurse Practitioners) who all work together to provide you with the care you need, when you need it.  We recommend signing up for the patient portal called "MyChart".  Sign up information is provided on this After Visit Summary.  MyChart is used to connect with patients for Virtual Visits (Telemedicine).  Patients are able to view lab/test results, encounter notes, upcoming appointments, etc.  Non-urgent messages can be sent to your provider as well.   To learn more about what you can do with MyChart, go to [email or web address].    Your next appointment:   9 months with Dr Graciela Husbands

## 2023-12-21 LAB — CBC
Hematocrit: 39.1 % (ref 37.5–51.0)
Hemoglobin: 12.6 g/dL — ABNORMAL LOW (ref 13.0–17.7)
MCH: 28.4 pg (ref 26.6–33.0)
MCHC: 32.2 g/dL (ref 31.5–35.7)
MCV: 88 fL (ref 79–97)
Platelets: 268 10*3/uL (ref 150–450)
RBC: 4.43 x10E6/uL (ref 4.14–5.80)
RDW: 13.5 % (ref 11.6–15.4)
WBC: 7.8 10*3/uL (ref 3.4–10.8)

## 2024-01-03 ENCOUNTER — Other Ambulatory Visit: Payer: Self-pay | Admitting: Internal Medicine

## 2024-01-03 DIAGNOSIS — I480 Paroxysmal atrial fibrillation: Secondary | ICD-10-CM

## 2024-01-06 ENCOUNTER — Ambulatory Visit: Payer: Medicare Other | Admitting: Urology

## 2024-01-06 VITALS — BP 132/72 | Wt 185.2 lb

## 2024-01-06 DIAGNOSIS — N138 Other obstructive and reflux uropathy: Secondary | ICD-10-CM

## 2024-01-06 DIAGNOSIS — Z87898 Personal history of other specified conditions: Secondary | ICD-10-CM

## 2024-01-06 DIAGNOSIS — N401 Enlarged prostate with lower urinary tract symptoms: Secondary | ICD-10-CM

## 2024-01-06 DIAGNOSIS — N3281 Overactive bladder: Secondary | ICD-10-CM

## 2024-01-06 DIAGNOSIS — Z09 Encounter for follow-up examination after completed treatment for conditions other than malignant neoplasm: Secondary | ICD-10-CM | POA: Diagnosis not present

## 2024-01-06 DIAGNOSIS — N390 Urinary tract infection, site not specified: Secondary | ICD-10-CM

## 2024-01-06 LAB — BLADDER SCAN AMB NON-IMAGING

## 2024-01-06 MED ORDER — FINASTERIDE 5 MG PO TABS: 5.0000 mg | ORAL_TABLET | Freq: Every day | ORAL | 3 refills | Status: DC

## 2024-01-06 MED ORDER — TAMSULOSIN HCL 0.4 MG PO CAPS: ORAL_CAPSULE | ORAL | 3 refills | Status: DC

## 2024-01-06 MED ORDER — GEMTESA 75 MG PO TABS: 75.0000 mg | ORAL_TABLET | Freq: Every day | ORAL | 3 refills | Status: DC

## 2024-01-06 NOTE — Progress Notes (Signed)
   01/06/2024 1:53 PM   Gerrick Bok Biehler [birth year] [id number]  Reason for visit: Follow up BPH/LUTS, OAB, recurrent UTI  HPI: 89 year old male with long history of urinary symptoms, currently managed on Flomax, finasteride, and Gemtesa.  PVRs have been normal, including normal at today.  He was seen by Michiel Cowboy, PA in September and October 2024 for UTI symptoms and dysuria, culture grew Klebsiella.  Unclear if this was true recurrent UTI or incomplete treatment of an initial UTI/prostatitis, I do not see how many days of antibiotics he initially received.  He also had some persistent microscopic hematuria after being treated with antibiotics with 3-10 RBC, and Shannon McGowan ordered a renal/bladder ultrasound.  I personally viewed and interpreted those images from 09/30/2023 showing no hydronephrosis or stones, 94 g prostate.  She had recommended considering cystoscopy with microscopic hematuria and infections, but he deferred.  We reviewed the risks and benefits of cystoscopy today, and he would like to defer cystoscopy since his symptoms have resolved.  He denies any dysuria or any real significant urinary complaints today.  With his age, frailty, and comorbidities, would recommend continuing maximal medical management.  -Flomax, finasteride, Gemtesa refilled -He deferred cystoscopy, return precautions discussed including recurrent infections, gross hematuria, retention -RTC 1 year PVR  Sondra Come, MD  [facility] [address], [address] [place], [place] [address] [phone]

## 2024-01-06 NOTE — Patient Instructions (Signed)
You have an enlarged prostate and overactive bladder.  Continue Flomax and finasteride for your prostate, and Gemtesa for your bladder.  You have a very small amount of microscopic blood in the urine that is unlikely to represent cancer or other significant problem, your recent ultrasound of your kidneys and bladder was normal.  If you have worsening burning with urination, blood in the urine that you can see, recurrent urinary infections, or difficulty urinating, please contact the clinic to consider further evaluation with cystoscopy.

## 2024-01-08 ENCOUNTER — Encounter: Payer: Self-pay | Admitting: Student

## 2024-01-22 ENCOUNTER — Other Ambulatory Visit: Payer: Self-pay | Admitting: Cardiovascular Disease

## 2024-02-17 ENCOUNTER — Telehealth: Payer: Self-pay | Admitting: Urology

## 2024-02-17 NOTE — Telephone Encounter (Signed)
 PT just walked in and stated that they were instructed from there pharmacy [facility] that they need an Prior Auth for his prescription Gemtesa. Pt said prescription is too expensive.

## 2024-02-18 NOTE — Telephone Encounter (Signed)
 PA attempted via covermymeds for Gemtesa. Per Covermymeds no PA is required, called [facility] to inquire about the discrepancy. No answer from pharmacy. Left detailed voicemail for pharmacy to call back.

## 2024-03-16 ENCOUNTER — Ambulatory Visit: Admitting: Physician Assistant

## 2024-03-16 VITALS — BP 185/74 | HR 71

## 2024-03-16 DIAGNOSIS — R350 Frequency of micturition: Secondary | ICD-10-CM

## 2024-03-16 DIAGNOSIS — N411 Chronic prostatitis: Secondary | ICD-10-CM

## 2024-03-16 LAB — URINALYSIS, COMPLETE
Bilirubin, UA: NEGATIVE
Glucose, UA: NEGATIVE
Ketones, UA: NEGATIVE
Leukocytes,UA: NEGATIVE
Nitrite, UA: NEGATIVE
Protein,UA: NEGATIVE
Specific Gravity, UA: 1.02 (ref 1.005–1.030)
Urobilinogen, Ur: 0.2 mg/dL (ref 0.2–1.0)
pH, UA: 6.5 (ref 5.0–7.5)

## 2024-03-16 LAB — MICROSCOPIC EXAMINATION

## 2024-03-16 LAB — BLADDER SCAN AMB NON-IMAGING: Scan Result: 0

## 2024-03-16 MED ORDER — DOXYCYCLINE HYCLATE 100 MG PO CAPS: 100.0000 mg | ORAL_CAPSULE | Freq: Two times a day (BID) | ORAL | 0 refills | Status: AC

## 2024-03-16 NOTE — Progress Notes (Signed)
 03/16/2024 11:17 AM   Robert Burch Oct 02, 1933 [id number]  CC: Chief Complaint  Patient presents with   penile soreness   HPI: Robert Burch is a 89 y.o. male with PMH HTN; BPH with LUTS and OAB on Flomax, finasteride, and Gemtesa; recurrent UTI; and microscopic hematuria who deferred cystoscopy who presents today for evaluation of penile soreness. He is accompanied today by his wife.   Today he clarifies that the soreness is in his prostate, not his penis. His penis is unaffected.  He has been having 2 weeks of intermittent perineal/prostate aching not associated with voiding as well as increased urinary frequency. He has had similar symptoms prior that resolved with antibiotics. He denies groin swelling or penile discharge, fever, chills, nausea, and vomiting. He has occasional orthostasis.  He also asks about alternative OAB medications. Robert Burch is very expensive at $610 for a 90-day supply. He was previously on Myrbetriq 25mg .  In-office UA today positive for trace intact blood; urine microscopy with 3-10 RBCs/HPF. PVR 0mL.  PMH: Past Medical History:  Diagnosis Date   Atypical chest pain    a. Nonobstructive, very minor irregs by cath 2007;  b. 06/2009 Ex MV: small area of apical lateral ischemia;  c. 06/2009 Cath: essentially normal;  d. 04/2014 Lexiscan MV: EF 59%, no ischemia/infarct->low risk.   Cancer (HCC)    Chronic anticoagulation    a. Chronic Coumadin   Chronic anxiety    Diverticulosis    Dysrhythmia    atrial fib   GERD (gastroesophageal reflux disease)    Hypertension    IBS (irritable bowel syndrome)    Nonischemic cardiomyopathy (HCC)    a. EF 45% by echo 2010;  b. 05/2012 Echo: EF 55-65%, no rwma, Gr 1 DD, mild AI/MR, mildly dil LA.   Paralyzed vocal cords    Paroxysmal atrial fibrillation (HCC)    a. Treated with flecainide   Proteinuria    History of    Surgical History: Past Surgical History:  Procedure Laterality Date   CARDIAC  CATHETERIZATION     cataract surgery Bilateral    HIP ARTHROPLASTY Right 03/17/2020   Procedure: ARTHROPLASTY BIPOLAR HIP (HEMIARTHROPLASTY);  Surgeon: Signa Kell, MD;  Location: [facility] ORS;  Service: Orthopedics;  Laterality: Right;   MASS BIOPSY  2013    Home Medications:  Allergies as of 03/16/2024       Reactions   Ciprofloxacin    Diarrhea and stomach pains   Lanoxin [digoxin]    Lopressor [metoprolol Tartrate]    Penicillins    Other reaction(s): Unknown   Sulfonamide Derivatives    Rash   Verapamil    Sulfa Antibiotics Rash        Medication List        Accurate as of March 16, 2024 11:17 AM. If you have any questions, ask your nurse or doctor.          acetaminophen 325 MG tablet Commonly known as: TYLENOL Take 650 mg by mouth 4 (four) times daily.   acidophilus Caps capsule Take 1 capsule by mouth daily.   alendronate 70 MG tablet Commonly known as: FOSAMAX Take 1 tablet (70 mg total) by mouth once a week.   alum & mag hydroxide-simeth 400-400-40 MG/5ML suspension Commonly known as: MAALOX PLUS Take 30 mLs by mouth every 4 (four) hours as needed for indigestion.   bisacodyl 5 MG EC tablet Commonly known as: DULCOLAX Take 1 tablet (5 mg total) by mouth daily as needed for moderate  constipation.   calcium-vitamin D 500-5 MG-MCG tablet Commonly known as: OSCAL WITH D Take 1 tablet by mouth daily.   clotrimazole-betamethasone cream Commonly known as: LOTRISONE Apply 1 Application topically as needed.   Cranberry 500 MG Caps Take 500 mg by mouth daily.   diltiazem 30 MG tablet Commonly known as: CARDIZEM TAKE 1 TABLET (30 MG) BY MOUTH ONCE DAILY AT BEDTIME   doxycycline 100 MG capsule Commonly known as: VIBRAMYCIN Take 1 capsule (100 mg total) by mouth 2 (two) times daily for 14 days.   finasteride 5 MG tablet Commonly known as: PROSCAR Take 1 tablet (5 mg total) by mouth daily.   Fish Oil 1000 MG Caps Take 1,000 mg by mouth daily.    Flax Seed Oil 1000 MG Caps Take 1,000 mg by mouth daily.   flecainide 100 MG tablet Commonly known as: TAMBOCOR Take 1 tablet (100 mg total) by mouth every 12 (twelve) hours as needed (atrial fibrillation or palpitations).   Gemtesa 75 MG Tabs Generic drug: Vibegron Take 1 tablet (75 mg total) by mouth daily.   hydrocortisone 25 MG suppository Commonly known as: ANUSOL-HC Place 25 mg rectally daily as needed for hemorrhoids or anal itching.   lidocaine 4 % Place 1 patch onto the skin. As needed   magnesium oxide 400 MG tablet Commonly known as: MAG-OX TAKE 1 TABLET BY MOUTH TWICE A DAY   multivitamin tablet Take 1 tablet by mouth daily.   Orajel 2X Toothache & Gum 20-0.26 % Gel Generic drug: Benzocaine-Menthol Use as directed in the mouth or throat every 6 (six) hours as needed.   polyethylene glycol 17 g packet Commonly known as: MIRALAX / GLYCOLAX Take 17 g by mouth daily as needed for mild constipation or moderate constipation.   tamsulosin 0.4 MG Caps capsule Commonly known as: FLOMAX One capsule po at bedtime (start on 10/07/23)   Vitamin D3 10 MCG (400 UNIT) Caps Take 800 Units by mouth daily.   warfarin 2.5 MG tablet Commonly known as: COUMADIN By mouth daily on tuesdays   Zilretta 32 MG Srer intra-articular injection Generic drug: Triamcinolone Acetonide Inject 1 vial into each knee.        Allergies:  Allergies  Allergen Reactions   Ciprofloxacin     Diarrhea and stomach pains   Lanoxin [Digoxin]    Lopressor [Metoprolol Tartrate]    Penicillins     Other reaction(s): Unknown   Sulfonamide Derivatives     Rash    Verapamil    Sulfa Antibiotics Rash    Family History: Family History  Problem Relation Age of Onset   Heart attack Neg Hx     Social History:   reports that he has never smoked. He has never used smokeless tobacco. He reports that he does not drink alcohol and does not use drugs.  Physical Exam: BP (!) 185/74   Pulse  71   Constitutional:  Alert and oriented, no acute distress, nontoxic appearing HEENT: [place], AT Cardiovascular: No clubbing, cyanosis, or edema Respiratory: Normal respiratory effort, no increased work of breathing Skin: No rashes, bruises or suspicious lesions Neurologic: Grossly intact, no focal deficits, moving all 4 extremities Psychiatric: Normal mood and affect  Laboratory Data: Results for orders placed or performed in visit on 03/16/24  Microscopic Examination   Collection Time: 03/16/24 10:53 AM   Urine  Result Value Ref Range   WBC, UA 0-5 0 - 5 /hpf   RBC, Urine 3-10 (A) 0 - 2 /hpf  Epithelial Cells (non renal) 0-10 0 - 10 /hpf   Casts Present (A) None seen /lpf   Cast Type Hyaline casts N/A   Mucus, UA Present (A) Not Estab.   Bacteria, UA Few None seen/Few  Urinalysis, Complete   Collection Time: 03/16/24 10:53 AM  Result Value Ref Range   Specific Gravity, UA 1.020 1.005 - 1.030   pH, UA 6.5 5.0 - 7.5   Color, UA Yellow Yellow   Appearance Ur Clear Clear   Leukocytes,UA Negative Negative   Protein,UA Negative Negative/Trace   Glucose, UA Negative Negative   Ketones, UA Negative Negative   RBC, UA Trace (A) Negative   Bilirubin, UA Negative Negative   Urobilinogen, Ur 0.2 0.2 - 1.0 mg/dL   Nitrite, UA Negative Negative   Microscopic Examination See below:   Bladder Scan (Post Void Residual) in office   Collection Time: 03/16/24 10:55 AM  Result Value Ref Range   Scan Result 0 ml    Assessment & Plan:   1. Chronic prostatitis (Primary) 2 weeks of intermittent perineal aching consistent with chronic prostatitis.  Unfortunately, I cannot increase his Flomax since he already has some occasional orthostasis at baseline.  Will treat with 2 weeks of empiric doxycycline and send for culture for further evaluation. - doxycycline (VIBRAMYCIN) 100 MG capsule; Take 1 capsule (100 mg total) by mouth 2 (two) times daily for 14 days.  Dispense: 28 capsule; Refill: 0 -  CULTURE, URINE COMPREHENSIVE - Urinalysis, Complete  2. Frequent urination He is emptying appropriately today.  Unfortunately, anticholinergics are contraindicated given his age and cognitive status.  Myrbetriq is contraindicated given his baseline hypertension.  I do not have any alternative pharmacologic options for him, so we will request a tier exception with his insurance for [facility]. - Bladder Scan (Post Void Residual) in office   Return if symptoms worsen or fail to improve.  Carman Ching, PA-C  [facility] [place] [address] [place], [place] [address] [phone]

## 2024-03-19 LAB — CULTURE, URINE COMPREHENSIVE

## 2024-04-12 ENCOUNTER — Telehealth: Payer: Self-pay

## 2024-04-12 NOTE — Telephone Encounter (Signed)
 Pt LM on triage line stating that he is attempting to get rx for Gemtesa . Last OV states that a tier exception would be filed, any update on this?

## 2024-04-17 NOTE — Telephone Encounter (Signed)
 Pt states he was charged to $400 for a 3 month supply of Gemtesa . Pt states he did pick this up but is not happy about the price, he is good for 3 months but was wondering if there was something else he could try or if we could do anything to lower it.

## 2024-04-19 NOTE — Telephone Encounter (Signed)
 Please find out if he has any constipation, dry mouth, or dry eye at baseline.  If not, okay to try trospium ER 60 mg daily or trospium IR 20 mg twice daily.  Per my prior note, Myrbetriq  is contraindicated with his hypertension.  If he has any of the anticholinergic side effects listed above at baseline, then I do not have any alternative pharmacotherapy to offer him and would offer him an office visit to discuss third line therapies.

## 2024-04-21 NOTE — Telephone Encounter (Signed)
 Informed pt via letter sent. Pt requested a letter sent to better understand options.

## 2024-05-05 ENCOUNTER — Other Ambulatory Visit: Payer: Self-pay | Admitting: Cardiovascular Disease

## 2024-07-12 ENCOUNTER — Encounter: Payer: Self-pay | Admitting: Urology

## 2024-07-12 ENCOUNTER — Ambulatory Visit: Admitting: Urology

## 2024-07-12 VITALS — BP 168/76 | HR 59 | Ht 69.0 in | Wt 190.0 lb

## 2024-07-12 DIAGNOSIS — R350 Frequency of micturition: Secondary | ICD-10-CM

## 2024-07-12 DIAGNOSIS — R3129 Other microscopic hematuria: Secondary | ICD-10-CM | POA: Diagnosis not present

## 2024-07-12 DIAGNOSIS — N411 Chronic prostatitis: Secondary | ICD-10-CM

## 2024-07-12 DIAGNOSIS — N400 Enlarged prostate without lower urinary tract symptoms: Secondary | ICD-10-CM | POA: Diagnosis not present

## 2024-07-12 LAB — MICROSCOPIC EXAMINATION: WBC, UA: >30 /HPF — AB (ref 0–5)

## 2024-07-12 LAB — URINALYSIS, COMPLETE
Bilirubin, UA: NEGATIVE
Glucose, UA: NEGATIVE
Ketones, UA: NEGATIVE
Nitrite, UA: NEGATIVE
Specific Gravity, UA: 1.01 (ref 1.005–1.030)
Urobilinogen, Ur: 0.2 mg/dL (ref 0.2–1.0)
pH, UA: 7 (ref 5.0–7.5)

## 2024-07-12 LAB — BLADDER SCAN AMB NON-IMAGING

## 2024-07-12 MED ORDER — DOXYCYCLINE HYCLATE 100 MG PO CAPS: 100.0000 mg | ORAL_CAPSULE | Freq: Two times a day (BID) | ORAL | 0 refills | Status: DC

## 2024-07-12 NOTE — Patient Instructions (Signed)

## 2024-07-12 NOTE — Progress Notes (Signed)
 07/12/2024 8:40 AM   Robert Burch 05/17/1933 [id number]  Referring provider: Glover Lenis, MD [phone] Robert Burch,  [place] [address]  Urological history: 1. BPH w/ LU TS - PSA (2019) 1.43 - RUS (2024) no hydro - prostate volume ~ 100 grams - Tamsulosin  0.4 mg daily, finasteride  5 mg daily and Gemtesa  75 mg daily  - Myrbetriq  contraindicated secondary to hypertension  Chief Complaint  Patient presents with   Other   HPI: Robert Burch is a 89 y.o. man who presents today for prostate issues, aching and frequency with his wife, Robert Burch.   Previous records reviewed.   He has been experiencing soreness and achiness in the perineal area for 2 weeks.  Reviewing his records, he has had issues with perineal pain off-and-on for the last several months.   He has also had persistent microscopic hematuria as well.  He also complains of burning after his first episodes of nocturia that continue throughout the night.  He has nocturia to excess of 4 times.  The nocturia and burning at night have been occurring for several months.  Patient denies any modifying or aggravating factors.  Patient denies any recent UTI's, gross hematuria or suprapubic/flank pain.  Patient denies any fevers, chills, nausea or vomiting.     UA with leuks, micro heme and bacteria.  PVR 3 mL  He is a non smoker.    Lab Results  Component Value Date   CREATININE 0.76 10/05/2023    PMH: Past Medical History:  Diagnosis Date   Atypical chest pain    a. Nonobstructive, very minor irregs by cath 2007;  b. 06/2009 Ex MV: small area of apical lateral ischemia;  c. 06/2009 Cath: essentially normal;  d. 04/2014 Lexiscan  MV: EF 59%, no ischemia/infarct->low risk.   Cancer (HCC)    Chronic anticoagulation    a. Chronic Coumadin    Chronic anxiety    Diverticulosis    Dysrhythmia    atrial fib   GERD (gastroesophageal reflux disease)    Hypertension    IBS (irritable bowel syndrome)     Nonischemic cardiomyopathy (HCC)    a. EF 45% by echo 2010;  b. 05/2012 Echo: EF 55-65%, no rwma, Gr 1 DD, mild AI/MR, mildly dil LA.   Paralyzed vocal cords    Paroxysmal atrial fibrillation (HCC)    a. Treated with flecainide    Proteinuria    History of    Surgical History: Past Surgical History:  Procedure Laterality Date   CARDIAC CATHETERIZATION     cataract surgery Bilateral    HIP ARTHROPLASTY Right 03/17/2020   Procedure: ARTHROPLASTY BIPOLAR HIP (HEMIARTHROPLASTY);  Surgeon: [name], MD;  Location: [facility] ORS;  Service: Orthopedics;  Laterality: Right;   MASS BIOPSY  2013    Home Medications:  Allergies as of 07/12/2024       Reactions   Ciprofloxacin    Diarrhea and stomach pains   Lanoxin [digoxin]    Lopressor [metoprolol Tartrate]    Penicillins    Other reaction(s): Unknown   Sulfonamide Derivatives    Rash   Verapamil    Sulfa Antibiotics Rash        Medication List        Accurate as of July 12, 2024  8:40 AM. If you have any questions, ask your nurse or doctor.          acetaminophen  325 MG tablet Commonly known as: TYLENOL  Take 650 mg by mouth 4 (four) times daily.  acidophilus Caps capsule Take 1 capsule by mouth daily.   alendronate  70 MG tablet Commonly known as: FOSAMAX  Take 1 tablet (70 mg total) by mouth once a week.   alum & mag hydroxide-simeth 400-400-40 MG/5ML suspension Commonly known as: MAALOX PLUS Take 30 mLs by mouth every 4 (four) hours as needed for indigestion.   bisacodyl  5 MG EC tablet Commonly known as: DULCOLAX Take 1 tablet (5 mg total) by mouth daily as needed for moderate constipation.   calcium -vitamin D 500-5 MG-MCG tablet Commonly known as: OSCAL WITH D Take 1 tablet by mouth daily.   clotrimazole-betamethasone cream Commonly known as: LOTRISONE Apply 1 Application topically as needed.   Cranberry 500 MG Caps Take 500 mg by mouth daily.   diltiazem  30 MG tablet Commonly known as:  CARDIZEM  TAKE 1 TABLET (30 MG) BY MOUTH ONCE DAILY AT BEDTIME   finasteride  5 MG tablet Commonly known as: PROSCAR  Take 1 tablet (5 mg total) by mouth daily.   Fish Oil 1000 MG Caps Take 1,000 mg by mouth daily.   Flax Seed Oil 1000 MG Caps Take 1,000 mg by mouth daily.   flecainide  100 MG tablet Commonly known as: TAMBOCOR  Take 1 tablet (100 mg total) by mouth every 12 (twelve) hours as needed (atrial fibrillation or palpitations).   Gemtesa  75 MG Tabs Generic drug: Vibegron  Take 1 tablet (75 mg total) by mouth daily.   hydrocortisone 25 MG suppository Commonly known as: ANUSOL-HC Place 25 mg rectally daily as needed for hemorrhoids or anal itching.   lidocaine  4 % Place 1 patch onto the skin. As needed   magnesium  oxide 400 MG tablet Commonly known as: MAG-OX TAKE 1 TABLET BY MOUTH TWICE A DAY   multivitamin tablet Take 1 tablet by mouth daily.   Orajel 2X Toothache & Gum 20-0.26 % Gel Generic drug: Benzocaine-Menthol  Use as directed in the mouth or throat every 6 (six) hours as needed.   polyethylene glycol 17 g packet Commonly known as: MIRALAX  / GLYCOLAX  Take 17 g by mouth daily as needed for mild constipation or moderate constipation.   tamsulosin  0.4 MG Caps capsule Commonly known as: FLOMAX  One capsule po at bedtime (start on 10/07/23)   Vitamin D3 10 MCG (400 UNIT) Caps Take 800 Units by mouth daily.   warfarin 2.5 MG tablet Commonly known as: COUMADIN  By mouth daily on tuesdays   Zilretta 32 MG Srer intra-articular injection Generic drug: Triamcinolone Acetonide Inject 1 vial into each knee.        Allergies:  Allergies  Allergen Reactions   Ciprofloxacin     Diarrhea and stomach pains   Lanoxin [Digoxin]    Lopressor [Metoprolol Tartrate]    Penicillins     Other reaction(s): Unknown   Sulfonamide Derivatives     Rash    Verapamil    Sulfa Antibiotics Rash    Family History: Family History  Problem Relation Age of Onset    Heart attack Neg Hx     Social History: See HPI for pertinent social history  ROS: Pertinent ROS in HPI  Physical Exam: BP (!) 168/76   Pulse (!) 59   Ht 5' 9 (1.753 m)   Wt 190 lb (86.2 kg)   BMI 28.06 kg/m   Constitutional:  Well nourished. Alert and oriented, No acute distress. HEENT: [place] AT, moist mucus membranes.  Trachea midline Cardiovascular: No clubbing, cyanosis, or edema. Respiratory: Normal respiratory effort, no increased work of breathing. Neurologic: Grossly intact, no focal deficits,  moving all 4 extremities. Psychiatric: Normal mood and affect.  Laboratory Data: See EPIC and HPI  I have reviewed the labs.   Pertinent Imaging:  07/12/24 08:38  Scan Result 3ml    Assessment & Plan:    1. High risk hematuria - non smoker - Renal ultrasound (2024) no hydro, no stones and no masses - Refused cystoscopy - No reports of gross hematuria - UA w/ micro heme - Discussed with patient that the persistent microscopic hematuria may be the result of his massive BPH, but with his symptoms of burning throughout the night that have been present several months, it would be worthwhile to pursue a cystoscopy to ensure no other worrisome pathology is responsible - He and his wife are in agreement and he would like to pursue cystoscopy at this time  2. BPH with LU TS - He is already on maximal therapy with tamsulosin , finasteride  and Gemtesa  -He states he has noticed no difference with the addition of Gemtesa  and it is costing him $700 for 90-day supply, so I recommend that he stop the medication - Explained that his symptoms may be the result of massive BPH, but there could be other pathologies - aged out of screening  - UA w/ micro heme, leuks and bacteria - PVR < 300 cc  - most bothersome symptoms are burning  - encouraged avoiding bladder irritants, fluid restriction before bedtime and timed voiding's - Continue tamsulosin  0.4 mg daily and finasteride  5 mg daily -  Cannot tolerate medication or medication failure, schedule cystoscopy  - educated on red flag symptoms: acute retention, gross hematuria, fever, severe pain - advised to call clinic or go to the ED if these occur  3. Prostatitis - UA suspicious for infection, but may also be the result of colonization - Urine culture is pending - Start doxycycline  100 mg twice daily for 7 days, advised him to stay out of the sun  - Will follow cultures to ensure he is on culture appropriate antibiotic for at least 2 weeks -He will be returning for cystoscopy for further evaluation   No follow-ups on file.  These notes generated with voice recognition software. I apologize for typographical errors.  Robert Burch  [facility] [address]  [address] [place], [place] [address] [phone]

## 2024-07-13 NOTE — Progress Notes (Signed)
 Established patient Visit:  Date of Service: 07/17/2024  CC:  Chief Complaint  Patient presents with  . Nail Problem    painful thick nails    Robert Burch is a 89 y.o. who presents for routine foot care.  Patient would like his nails trimmed today as they are painful and incurvated at all borders.  Patient does state that he has numbness and tingling to both of his feet.  Patient was an Acupuncturist in the past.  Patient's last hemoglobin A1c was 6.3%.  History of Present Illness   Previous Hemoglobin A1C Result(s) Hemoglobin A1C  Date Value Ref Range Status  03/06/2024 6.3 (H) 4.2 - 5.6 % Final  11/30/2023 5.2 4.2 - 5.6 % Final  08/25/2023 5.9 (H) 4.2 - 5.6 % Final  02/08/2023 6.0 (H) 4.2 - 5.6 % Final   PMH: Past Medical History:  Diagnosis Date  . Anemia   . Anxiety   . Anxiety disorder 10/17/2013   Last Assessment & Plan:  Anxiety seems to be better controlled on today's visit. His son is doing well following valve surgery  . Atypical chest pain   . Cervical radiculitis 05/22/2014  . Chronic constipation   . Chronic prostatitis 08/31/2013  . DDD (degenerative disc disease), lumbar 05/22/2014  . Diverticulosis   . Gastric polyps 01/10/2015   negative biopsy  . GERD (gastroesophageal reflux disease)   . History of chronic fatigue syndrome   . History of migraine headaches   . History of proteinuria syndrome   . Hypertension   . 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 Irritable bowel syndrome   . Lumbar radiculitis 05/22/2014  . 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 Paroxysmal atrial fibrillation (CMS/HHS-HCC)   . Prostatitis    recurrent  . Skin cancer    left leg  . Syncope and collapse 12/07/2011   Last Assessment & Plan:  No recent syncope Last Assessment & Plan:  No recent syncope    Medication: Current Outpatient Medications on File Prior to Visit  Medication Sig Dispense Refill  . acetaminophen   (TYLENOL ) 325 MG tablet Take 650 mg by mouth every 4 (four) hours as needed for Pain    . alendronate  (FOSAMAX ) 70 MG tablet TAKE 1 TABLET BY MOUTH EVERY 7 DAYS TAKE WITH A FULL GLASS OF WATER. DO NOT LIE DOWN FOR 30 MIN 12 tablet 3  . aluminum & magnesium  hydroxide-simethicone , DUH/DRH, (MYLANTA MAXIMUM) 400-400-40 mg/5 mL suspension Take 30 mLs by mouth    . bisacodyL  (DULCOLAX) 5 mg EC tablet Take 5 mg by mouth once daily as needed for Constipation    . calcium  carbonate-vitamin D3 (OYSCO 500/D) 500 mg-5 mcg (200 unit) tablet Take 1 tablet by mouth once daily    . cefUROXime  (CEFTIN ) 500 MG tablet Take 500 mg by mouth 2 (two) times daily with meals    . clindamycin (CLEOCIN) 150 MG capsule     . clotrimazole-betamethasone (LOTRISONE) 1-0.05 % cream APPLY TOPICALLY 2 (TWO) TIMES DAILY TO DRY CRACKED SKIN ON FEET 30 g 1  . co-enzyme Q-10, ubiquinone, 50 mg capsule Take 50 mg by mouth 2 (two) times daily.    . cranberry extract 500 mg Cap Take by mouth    . diltiazem  (CARDIZEM ) 30 MG tablet Take 1 tablet (30 mg) by mouth once daily at bedtime    . finasteride  (PROSCAR ) 5 mg tablet Take 5 mg by mouth once daily    .  flaxseed oil Oil Use 1 capsule once daily.    . flecainide  (TAMBOCOR ) 100 MG tablet Take 100 mg by mouth every 12 (twelve) hours as needed (as needed (atrial fibrillation or palpitations).)    . flecainide  (TAMBOCOR ) 50 MG tablet Take 50 mg by mouth 2 (two) times daily as needed    . fluticasone propion-salmeteroL (WIXELA INHUB) 250-50 mcg/dose diskus inhaler INHALE 1 PUFF INTO THE LUNGS 2 (TWO) TIMES DAILY AS NEEDED 60 each 2  . GEMTESA  75 mg Tab TAKE 1 TABLET BY MOUTH EVERY DAY 90 tablet 1  . ginger, Zingiber officinalis, 500 mg Cap Take 1 tablet by mouth once daily    . hydrocortisone (ANUSOL-HC) 25 mg suppository Place 1 suppository (25 mg total) rectally once daily as needed for Hemorrhoids 30 suppository 1  . Lactobacillus acidophilus Cap Take 1 capsule by mouth once daily.    .  magnesium  oxide (MAG-OX) 400 mg (241.3 mg magnesium ) tablet Take 1 tablet (400 mg total) by mouth 2 (two) times daily 180 tablet 1  . multivitamin tablet Take 1 tablet by mouth once daily.    . MYRBETRIQ  25 mg ER Tablet Take 25 mg by mouth once daily    . NITROSTAT  0.4 mg SL tablet Place 1 tablet under the tongue every 5 (five) minutes as needed.  6  . omega 3-dha-epa-fish oil 1,000 mg (120 mg-180 mg) Cap Take by mouth    . pantoprazole  (PROTONIX ) 20 MG DR tablet Take 1 tablet (20 mg total) by mouth once daily 90 tablet 3  . polyethylene glycol (MIRALAX ) powder Take 17 g by mouth once daily Mix in 4-8ounces of fluid prior to taking.    . saw palmetto  160 MG capsule Take 160 mg by mouth 3 (three) times daily    . tamsulosin  (FLOMAX ) 0.4 mg capsule Take 0.4 mg by mouth 2 (two) times daily Take 30 minutes after same meal each day.    . triamcinolone acetonide (ZILRETTA) 32 mg intra-articular suspension Inject 1 vial into each knee.    . urea (CEROVEL) 40 % topical cream Apply topically 2 (two) times daily To thickened toe nails 90 g 2  . vitamin B12-folic acid 0.5-1 mg Tab Take by mouth once daily.    . vitamin C-vitamin E Cap Take 1 capsule by mouth once daily.    SABRA warfarin (COUMADIN ) 5 MG tablet Take 1/2 (one-half) tablet by mouth once daily 45 tablet 0   No current facility-administered medications on file prior to visit.    Allergies: Allergies as of 07/17/2024 - Reviewed 07/17/2024  Allergen Reaction Noted  . Ciprofloxacin Diarrhea 05/21/2018  . Lanoxin [digoxin] Other (See Comments) 03/16/2014  . Lopressor [metoprolol tartrate] Other (See Comments) 03/16/2014  . Penicillin Other (See Comments) 10/06/2023  . Sulfa (sulfonamide antibiotics) Rash 03/16/2014  . Verapamil Other (See Comments) 03/16/2014    Surgical History: Past Surgical History:  Procedure Laterality Date  . COLONOSCOPY  09/10/1993  . SIGMOIDOSCOPY FLEXIBLE  11/05/1999  . CATARACT EXTRACTION Bilateral 02/2010  .  UPPER GASTROINTESTINAL ENDOSCOPY  11/16/2011   +Barrett's  . EGD  01/10/2015   Negative EGD biopsy/No Repeat/PYO  . COLONOSCOPY    . right hip hemiarthroplasty Right    Dr. Tobie    Social History: Social History   Socioeconomic History  . Marital status: Married  Tobacco Use  . Smoking status: Never  . Smokeless tobacco: Never  Vaping Use  . Vaping status: Never Used  Substance and Sexual Activity  . Alcohol  use: Yes    Alcohol/week: 0.0 standard drinks of alcohol    Comment: socially  . Drug use: No  . Sexual activity: Defer  Social History Narrative   Exercise:  Exercises almost daily for 45 minutes.   Diet:  Red meat once a week.  Fast foods none.  Fried foods none.   Pneumovax:2010   Social Drivers of Corporate investment banker Strain: Low Risk  (08/10/2023)   Overall Financial Resource Strain (CARDIA)   . Difficulty of Paying Living Expenses: Not hard at all  Food Insecurity: No Food Insecurity (10/02/2023)   Received from [facility]   Hunger Vital Sign   . Within the past 12 months, you worried that your food would run out before you got the money to buy more.: Never true   . Within the past 12 months, the food you bought just didn't last and you didn't have money to get more.: Never true  Transportation Needs: No Transportation Needs (10/02/2023)   Received from [facility] - Transportation   . Lack of Transportation (Medical): No   . Lack of Transportation (Non-Medical): No  Housing Stability: Low Risk  (03/31/2024)   Housing Stability Vital Sign   . Unable to Pay for Housing in the Last Year: No   . Number of Times Moved in the Last Year: 0   . Homeless in the Last Year: No   Social History   Tobacco Use  Smoking Status Never  Smokeless Tobacco Never    Past Family History: Family History  Problem Relation Name Age of Onset  . Breast cancer Mother    . Appendicitis Mother    . Cholelithiasis Mother    . High blood pressure  (Hypertension) Mother    . Arthritis Mother    . Arthritis Father    . Pneumonia Father    . Prostate cancer Father    . No Known Problems Maternal Grandmother    . No Known Problems Maternal Grandfather    . No Known Problems Paternal Grandmother    . No Known Problems Paternal Grandfather    . Diabetes type II Son       Review of Systems:  A comprehensive 14 point ROS was performed, reviewed, and the pertinent orthopaedic findings are documented in the HPI.  Objective: General/Constitutional: No apparent distress: well-nourished and well developed.  Psych: Normal mood and affect, oriented to person, place and time (AOx3)  Vascular: DP/PT pulses +1 bil, CFT intact to digits foot bil, no hair growth to digits foot noted bil.  Moderate pitting edema present to bilateral lower extremities.  Toes to both feet are warm to touch.  Neuro: Light touch sensation mildly reduced to digits bil  Derm: No openings present.  Skin is thin and atrophic to bilateral lower extremities.  Nails x10 thickened, elongated, dystrophic and brittle with subungual debris and incurvated.  Both hallux toenails appear to be fairly thickened overall but do not appear to be incurvated or ingrown today.  MSK: 5/5 strength to BLE MSK groups.  Mild semireducible hammertoe contractures 2 through 5 bilateral.  Mild hallux valgus contracture bilateral.  Xrays: None taken  Assessment: Encounter Diagnoses  Name Primary?  . Mycotic toenails Yes  . Pain in toes of both feet   . Neuropathy     Plan: -Pt was seen and examined. -Nails x10 debrided reducing the thickness of the toenail and excessive curvature of the significantly thickened/dystrophic/diseased nail with sterile nail nipper.  Patient tolerated procedure well. -Discussed daily foot inspections, no barefoot walking, and use of supportive shoes at all times.   No orders of the defined types were placed in this encounter.   Return in about 3 months  (around 10/17/2024) for RFC.    Prentice Ozell Lee, DPM

## 2024-07-22 LAB — CULTURE, URINE COMPREHENSIVE

## 2024-07-23 ENCOUNTER — Ambulatory Visit: Payer: Self-pay | Admitting: Urology

## 2024-07-24 ENCOUNTER — Other Ambulatory Visit: Payer: Self-pay

## 2024-07-24 MED ORDER — CEFPODOXIME PROXETIL 200 MG PO TABS: 200.0000 mg | ORAL_TABLET | Freq: Two times a day (BID) | ORAL | 0 refills | Status: DC

## 2024-07-24 NOTE — Telephone Encounter (Signed)
 Returned patients call. Ex[palined medication and why he needed to be on a second antibiotic. He stated understanding now and is going to go pick up the new medication Vantin .

## 2024-07-24 NOTE — Progress Notes (Signed)
 Called pt. And pt understood

## 2024-08-08 ENCOUNTER — Ambulatory Visit: Admitting: Urology

## 2024-08-08 VITALS — BP 185/90 | HR 60 | Wt 190.0 lb

## 2024-08-08 DIAGNOSIS — R300 Dysuria: Secondary | ICD-10-CM | POA: Diagnosis not present

## 2024-08-08 DIAGNOSIS — N401 Enlarged prostate with lower urinary tract symptoms: Secondary | ICD-10-CM

## 2024-08-08 DIAGNOSIS — N410 Acute prostatitis: Secondary | ICD-10-CM

## 2024-08-08 DIAGNOSIS — N138 Other obstructive and reflux uropathy: Secondary | ICD-10-CM

## 2024-08-08 DIAGNOSIS — R3129 Other microscopic hematuria: Secondary | ICD-10-CM | POA: Diagnosis not present

## 2024-08-08 MED ORDER — DOXYCYCLINE HYCLATE 100 MG PO CAPS
100.0000 mg | ORAL_CAPSULE | Freq: Two times a day (BID) | ORAL | 0 refills | Status: AC
Start: 2024-08-08 — End: 2024-09-05

## 2024-08-08 NOTE — Progress Notes (Signed)
 Cystoscopy Procedure Note:  Indication: Microscopic hematuria, dysuria  89 year old male with long history of urinary symptoms, has been on maximal medical therapy with Flomax , finasteride , Gemtesa .  PVRs have been normal.  There have also been some concerns about dysuria and possible infections, as well as low-grade microscopic hematuria.  Recent renal ultrasound was benign aside from enlarged prostate measuring 94 g.  Recently treated with 1 week course of doxycycline  for dysuria which initially improved his symptoms, but dysuria has since returned  After informed consent and discussion of the procedure and its risks, Robert Burch was positioned and prepped in the standard fashion. Cystoscopy was performed with a flexible cystoscope. The urethra, bladder neck and entire bladder was visualized in a standard fashion. The prostate was large with a significant median lobe with intravesical protrusion. The ureteral orifices were visualized in their normal location and orientation.  Bladder mucosa grossly normal throughout, no abnormalities on retroflexion aside from large intravesical median lobe  Imaging: Renal ultrasound with no abnormal findings, prostate measures 94 g  Findings: BPH, no suspicious lesions   ----------------------------------------------------------------------------------------------------  Assessment and Plan: 89 year old male with known BPH, 94 g prostate, urinary symptoms on maximal medical therapy he was deferred outlet procedures.  He has had intermittent dysuria, potentially from prostatitis.  Prior improvement with 1 week course of doxycycline , and I recommended extending this to a 4-week course and recheck his symptoms.  Could consider HOLEP or HOLEP of median lobe only in the future if persistent obstructive urinary symptoms and dysuria.  Doxycycline  x 4 weeks for suspected prostatitis RTC 4 weeks symptom check  Robert Burnet, MD 08/08/2024

## 2024-08-09 ENCOUNTER — Encounter: Payer: Self-pay | Admitting: Urology

## 2024-08-09 ENCOUNTER — Telehealth: Payer: Self-pay | Admitting: Urology

## 2024-08-09 NOTE — Telephone Encounter (Signed)
Called pt informed him of the information below per Dr. Richardo Hanks, pt voiced understanding.

## 2024-08-09 NOTE — Telephone Encounter (Signed)
 Per Epic only moderate interaction between the two drugs, please advise if change needed.

## 2024-08-09 NOTE — Telephone Encounter (Signed)
 Patient called and stated that he saw Dr. Francisca on Tuesday 8/19, and was prescribed doxycycline . He said that he also takes coumadin , and that he read that you shouldn't take those drugs together. He is asking what he should do. Please advise patient.

## 2024-08-14 NOTE — Progress Notes (Unsigned)
 Cardiology Office Note  Date:  08/15/2024   ID:  Robert Burch, Robert Burch 1933-09-18, MRN [id number]  PCP:  Glover Lenis, MD   Chief Complaint  Patient presents with   12 month follow up     Patient c/o shortness of breath with walking up an incline. Patient would like to know if antibiotic (doxycycline ) could interfere in his blood pressure medications.    HPI:  89 yo with history of  paroxysmal atrial fibrillation,  Atypical chest pain,  catheterization in 2007 showing nonobstructive CAD,  Previous CT coronary calcium  score in 2010 score of 68, predominately in the circumflex vessel Non smoker  PVCs, on magnesium  Chronic orthostasis Presenting for routine followup of his atrial fibrillation, orthostasis , chest pain symptoms.  Last seen in clinic by myself 2/24 Presents today with his son,  Fall last October 24, in SNF, layed up 2 months Became more deconditioned  Recently completed bladder scope Being treated for urinary tract infection, Proteus  with doxycycline  Has cold sweats in the afternoon, will soak her shirt  Still tries to go walking, SOB on inclines/hills  BP at home pressures 120 to 160 systolic  Continues to wear his thigh-high compressions Reports drinking orange juice several cups of coffee in the morning  Taking tamsulosin  once a day 9 PM Followed by urology  Denies significant tachycardia concerning for atrial fibrillation  Seen by Dr. Fernande October 2022 Orthostatic hypotension  admission to the hospital, fall,  fracture right hip Mar 2021  Lab work reviewed A1c 6.0 Total chol 144, LDL 92  EKG personally reviewed by myself on todays visit EKG Interpretation Date/Time:  Tuesday August 15 2024 10:24:56 EDT Ventricular Rate:  59 PR Interval:  174 QRS Duration:  94 QT Interval:  430 QTC Calculation: 425 R Axis:   -5  Text Interpretation: Sinus bradycardia When compared with ECG of 20-Dec-2023 12:18, Premature atrial complexes are no  longer Present Confirmed by Perla Lye [phone]) on 08/15/2024 10:30:11 AM    Other past medical history reviewed Echo 10/2020: reviewed with him Normal EF 55%   Seen in the emergency room August 2019 for atypical chest pain  06/2009, Results reviewed with him Coronary Calcium  Score: 68.2 (61.7 CFX, 6.5 LAD).   32nd percentile which is low risk overall.  Event monitor in April 2017 showing PVCs  Stress test July 2016 showing no ischemia Echocardiogram July 2016 showing normal LV function   Previously had significant GI workup for discomfort, most of which has been negative. This included an EGD, abdominal ultrasound. He does take Tylenol  and gabapentin.    PMH:   has a past medical history of Atypical chest pain, Cancer (HCC), Chronic anticoagulation, Chronic anxiety, Diverticulosis, Dysrhythmia, GERD (gastroesophageal reflux disease), Hypertension, IBS (irritable bowel syndrome), Nonischemic cardiomyopathy (HCC), Paralyzed vocal cords, Paroxysmal atrial fibrillation (HCC), and Proteinuria.  PSH:    Past Surgical History:  Procedure Laterality Date   CARDIAC CATHETERIZATION     cataract surgery Bilateral    HIP ARTHROPLASTY Right 03/17/2020   Procedure: ARTHROPLASTY BIPOLAR HIP (HEMIARTHROPLASTY);  Surgeon: [name], MD;  Location: [facility] ORS;  Service: Orthopedics;  Laterality: Right;   MASS BIOPSY  2013    Current Outpatient Medications  Medication Sig Dispense Refill   acetaminophen  (TYLENOL ) 325 MG tablet Take 650 mg by mouth 4 (four) times daily.     acidophilus (RISAQUAD) CAPS capsule Take 1 capsule by mouth daily.     alendronate  (FOSAMAX ) 70 MG tablet Take 1 tablet (70 mg total)  by mouth once a week. 4 tablet 0   alum & mag hydroxide-simeth (MAALOX PLUS) 400-400-40 MG/5ML suspension Take 30 mLs by mouth every 4 (four) hours as needed for indigestion.     Benzocaine-Menthol  (ORAJEL 2X TOOTHACHE & GUM) 20-0.26 % GEL Use as directed in the mouth or throat every 6 (six)  hours as needed.     bisacodyl  (DULCOLAX) 5 MG EC tablet Take 1 tablet (5 mg total) by mouth daily as needed for moderate constipation.     calcium -vitamin D (OSCAL WITH D) 500-5 MG-MCG tablet Take 1 tablet by mouth daily. 30 tablet 0   cefpodoxime  (VANTIN ) 200 MG tablet Take 1 tablet (200 mg total) by mouth 2 (two) times daily. 14 tablet 0   Cholecalciferol  (VITAMIN D3) 400 UNITS CAPS Take 800 Units by mouth daily.      clotrimazole-betamethasone (LOTRISONE) cream Apply 1 Application topically as needed.     Cranberry 500 MG CAPS Take 500 mg by mouth daily.     diltiazem  (CARDIZEM ) 30 MG tablet TAKE 1 TABLET (30 MG) BY MOUTH ONCE DAILY AT BEDTIME 90 tablet 3   doxycycline  (VIBRAMYCIN ) 100 MG capsule Take 1 capsule (100 mg total) by mouth every 12 (twelve) hours for 28 days. 56 capsule 0   finasteride  (PROSCAR ) 5 MG tablet Take 1 tablet (5 mg total) by mouth daily. 90 tablet 3   Flaxseed, Linseed, (FLAX SEED OIL) 1000 MG CAPS Take 1,000 mg by mouth daily.      flecainide  (TAMBOCOR ) 100 MG tablet Take 1 tablet (100 mg total) by mouth every 12 (twelve) hours as needed (atrial fibrillation or palpitations). 60 tablet 0   hydrocortisone (ANUSOL-HC) 25 MG suppository Place 25 mg rectally daily as needed for hemorrhoids or anal itching.     lidocaine  4 % Place 1 patch onto the skin. As needed     magnesium  oxide (MAG-OX) 400 MG tablet TAKE 1 TABLET BY MOUTH TWICE A DAY 60 tablet 2   Multiple Vitamin (MULTIVITAMIN) tablet Take 1 tablet by mouth daily.       Omega-3 Fatty Acids (FISH OIL) 1000 MG CAPS Take 1,000 mg by mouth daily.      pantoprazole  (PROTONIX ) 20 MG tablet Take 20 mg by mouth daily.     polyethylene glycol (MIRALAX  / GLYCOLAX ) packet Take 17 g by mouth daily as needed for mild constipation or moderate constipation.     Triamcinolone Acetonide (ZILRETTA) 32 MG SRER intra-articular injection Inject 1 vial into each knee.     warfarin (COUMADIN ) 2.5 MG tablet By mouth daily on tuesdays 4  tablet 0   doxycycline  (VIBRAMYCIN ) 100 MG capsule Take 1 capsule (100 mg total) by mouth every 12 (twelve) hours. (Patient not taking: Reported on 08/15/2024) 14 capsule 0   tamsulosin  (FLOMAX ) 0.4 MG CAPS capsule One capsule po at bedtime (start on 10/07/23) (Patient not taking: Reported on 08/15/2024) 90 capsule 3   Vibegron  (GEMTESA ) 75 MG TABS Take 1 tablet (75 mg total) by mouth daily. (Patient not taking: Reported on 08/15/2024) 90 tablet 3   No current facility-administered medications for this visit.    Allergies:   Ciprofloxacin, Lanoxin [digoxin], Lopressor [metoprolol tartrate], Penicillins, Sulfonamide derivatives, Verapamil, and Sulfa antibiotics   Social History:  The patient  reports that he has never smoked. He has never used smokeless tobacco. He reports that he does not drink alcohol and does not use drugs.   Family History:   family history is not on file.  Review of Systems: Review of Systems  Constitutional: Negative.        Sweats  HENT: Negative.    Respiratory: Negative.    Cardiovascular: Negative.   Gastrointestinal: Negative.   Musculoskeletal: Negative.        Leg pain  Neurological: Negative.   Psychiatric/Behavioral: Negative.    All other systems reviewed and are negative.   PHYSICAL EXAM: VS:  BP (!) 160/80 (BP Location: Right Arm, Patient Position: Sitting, Cuff Size: Normal)   Pulse (!) 59   Ht 5' 9 (1.753 m)   Wt 185 lb (83.9 kg)   SpO2 96%   BMI 27.32 kg/m  , BMI Body mass index is 27.32 kg/m.  Constitutional:  oriented to person, place, and time. No distress.  HENT:  Head: Grossly normal Eyes:  no discharge. No scleral icterus.  Neck: No JVD, no carotid bruits  Cardiovascular: Regular rate and rhythm, no murmurs appreciated Pulmonary/Chest: Clear to auscultation bilaterally, no wheezes or rales Abdominal: Soft.  no distension.  no tenderness.  Musculoskeletal: Normal range of motion Neurological:  normal muscle tone. Coordination  normal. No atrophy Skin: Skin warm and dry Psychiatric: normal affect, pleasant   Recent Labs: 10/02/2023: ALT 13 10/05/2023: BUN 18; Creatinine, Ser 0.76; Potassium 4.6; Sodium 132 12/20/2023: Hemoglobin 12.6; Platelets 268    Lipid Panel No results found for: CHOL, HDL, LDLCALC, TRIG    Wt Readings from Last 3 Encounters:  08/15/24 185 lb (83.9 kg)  08/08/24 190 lb (86.2 kg)  07/12/24 190 lb (86.2 kg)     ASSESSMENT AND PLAN:  Paroxysmal atrial fibrillation (HCC) -  No symptoms concerning for arrhythmia Takes diltiazem  30 at night Has not required flecainide  as needed Remains on warfarin  Essential hypertension  Denies significant orthostasis symptoms  staying hydrated Has cut back on his Flomax  now once a day  wearing thigh compressons Hydrating in the morning Weight stable No changes to medications made  Chest heaviness - Previous stress test early 2019.  Prior cath, nonobstructive  Chronic shortness of breath on walking up hills likely from deconditioning  Gastroesophageal reflux disease without esophagitis Symptoms better on PPI, Carafate, H2 blocker stable  Anxiety concerning health Recommend he continue walking program Son helps him at home  Hyperlipidemia Not on a statin Reasonable cholesterol  Flushing Recent diagnosis of urinary tract infection  Leg cramps Not an active issue    Orders Placed This Encounter  Procedures   EKG 12-Lead     Signed, Velinda Lunger, M.D., Ph.D. 08/15/2024  [facility] [place], [place] [phone]

## 2024-08-15 ENCOUNTER — Ambulatory Visit: Attending: Cardiovascular Disease | Admitting: Cardiovascular Disease

## 2024-08-15 ENCOUNTER — Encounter: Payer: Self-pay | Admitting: Cardiovascular Disease

## 2024-08-15 VITALS — BP 160/80 | HR 59 | Ht 69.0 in | Wt 185.0 lb

## 2024-08-15 DIAGNOSIS — I10 Essential (primary) hypertension: Secondary | ICD-10-CM

## 2024-08-15 DIAGNOSIS — N138 Other obstructive and reflux uropathy: Secondary | ICD-10-CM

## 2024-08-15 DIAGNOSIS — I491 Atrial premature depolarization: Secondary | ICD-10-CM

## 2024-08-15 DIAGNOSIS — I951 Orthostatic hypotension: Secondary | ICD-10-CM

## 2024-08-15 DIAGNOSIS — R001 Bradycardia, unspecified: Secondary | ICD-10-CM | POA: Diagnosis not present

## 2024-08-15 DIAGNOSIS — I428 Other cardiomyopathies: Secondary | ICD-10-CM

## 2024-08-15 DIAGNOSIS — I480 Paroxysmal atrial fibrillation: Secondary | ICD-10-CM | POA: Diagnosis not present

## 2024-08-15 DIAGNOSIS — I2510 Atherosclerotic heart disease of native coronary artery without angina pectoris: Secondary | ICD-10-CM

## 2024-08-15 DIAGNOSIS — N401 Enlarged prostate with lower urinary tract symptoms: Secondary | ICD-10-CM

## 2024-08-15 DIAGNOSIS — E782 Mixed hyperlipidemia: Secondary | ICD-10-CM

## 2024-08-15 MED ORDER — MAGNESIUM OXIDE 400 MG PO TABS: 1.0000 | ORAL_TABLET | Freq: Two times a day (BID) | ORAL | 3 refills | Status: DC

## 2024-08-15 NOTE — Patient Instructions (Addendum)
 Medication Instructions:  No changes  If you need a refill on your cardiac medications before your next appointment, please call your pharmacy.   Lab work: No new labs needed  Testing/Procedures: No new testing needed  Follow-Up: At [facility], you and your health needs are our priority.  As part of our continuing mission to provide you with exceptional heart care, we have created designated Provider Care Teams.  These Care Teams include your primary Cardiologist (physician) and Advanced Practice Providers (APPs -  Physician Assistants and Nurse Practitioners) who all work together to provide you with the care you need, when you need it.  You will need a follow up appointment in 6 months with APP  Providers on your designated Care Team:   Nicolasa Ducking, NP Eula Listen, PA-C Cadence Fransico Michael, [place]  COVID-19 Vaccine Information can be found at: [email or web address] For questions related to vaccine distribution or appointments, please email [email or web address] or call [phone].

## 2024-09-04 ENCOUNTER — Telehealth: Payer: Self-pay | Admitting: Cardiovascular Disease

## 2024-09-04 NOTE — Telephone Encounter (Signed)
   Pre-operative Risk Assessment    Patient Name: Robert Burch  DOB: 1933-01-09 MRN: [id number]   Date of last office visit: 08/15/2024 Date of next office visit: n/a  Request for Surgical Clearance    Procedure:  Dental Extraction - Amount of Teeth to be Pulled:  2  Date of Surgery:  Clearance TBD                                Surgeon:  Dr. Artis Socks Group or Practice Name:  [facility] Phone number:  [phone] Fax number:  [phone]   Type of Clearance Requested:   - Pharmacy:  Hold Warfarin (Coumadin )     Type of Anesthesia:  Local    Additional requests/questions:    SignedTinnie NOVAK Schools   09/04/2024, 2:48 PM

## 2024-09-04 NOTE — Telephone Encounter (Signed)
   Patient Name: Keylor Rands  DOB: 08-Jun-1933 MRN: [id number]  Primary Cardiologist: Evalene Lunger, MD  Chart reviewed as part of pre-operative protocol coverage.   Dental extractions (i.e. 1-2 teeth) are considered low risk procedures per guidelines and generally do not require any specific cardiac clearance. It is also generally accepted that for extractions of 2 teeth or less and dental cleanings, there is no need to interrupt blood thinner therapy.  SBE prophylaxis is not required for the patient from a cardiac standpoint.  I will route this recommendation to the requesting party via Epic fax function and remove from pre-op pool.  Please call with questions.  Jon Garre Courtnie Brenes, PA 09/04/2024, 3:45 PM

## 2024-09-14 ENCOUNTER — Ambulatory Visit: Admitting: Internal Medicine

## 2024-09-14 NOTE — Telephone Encounter (Signed)
 I will fax these notes to the dental office to see notes from Josefa Beauvais, FNP preop APP.

## 2024-09-14 NOTE — Telephone Encounter (Signed)
 I s/w Delon at the [facility] who was calling to be sure that pt has been cleared. She did confirm their office received the clearance notes. However, the pt called the DDS office and was telling them about his recent INR levels and he is notes sure if he can still have his procedure.   I assured Delon I will forward this to the preop APP for review as well as the pharm-d. Once we have have a yes or no to proceed, I assured Delon that we will let their office know.

## 2024-09-14 NOTE — Telephone Encounter (Signed)
 Caller Jesusa) is following-up on patient's clearance to have extractions.

## 2024-09-14 NOTE — Telephone Encounter (Signed)
 Preoperative team,  Patient's chart has been reviewed.  I agree with previous recommendations.  Patient may proceed with planned dental extraction.  If patient prefers to wait until INR is within 2.5-3.5 on next check, this would also be okay.  Thank you.  Josefa HERO. Tiesha Marich NP-C     09/14/2024, 10:26 AM [facility] [address] [place], [place] [address] Office [phone]

## 2024-09-19 ENCOUNTER — Telehealth: Payer: Self-pay | Admitting: Pharmacist

## 2024-09-19 ENCOUNTER — Ambulatory Visit: Admitting: Urology

## 2024-09-19 ENCOUNTER — Telehealth: Payer: Self-pay | Admitting: Cardiovascular Disease

## 2024-09-19 NOTE — Telephone Encounter (Signed)
 Pt is still bleeding after teeth extractions Dental Office called to get recommendations on want that needed to do. Please Advise   Call back info   [facility] Dentist   Delon [phone] and they leave for Lunch at 1 pm if the call is returned after 1 pm call [phone]

## 2024-09-19 NOTE — Telephone Encounter (Signed)
 Received call from dentist's office. Patient had extraction yesterday and was still experiencing bleeding.  Contacted patient who said he is having very little bleeding. Last INR was 3.8 so advised he could hold dose tonight to see if bleeding stopped.  Patient voiced understanding.

## 2024-09-20 ENCOUNTER — Ambulatory Visit: Admitting: Urology

## 2024-09-20 DIAGNOSIS — N401 Enlarged prostate with lower urinary tract symptoms: Secondary | ICD-10-CM

## 2024-09-20 DIAGNOSIS — N3281 Overactive bladder: Secondary | ICD-10-CM

## 2024-10-19 NOTE — Progress Notes (Signed)
 HPI:  Robert Burch is a 89 y.o. male who presents today for discussion of ongoing right knee pain.  The patient has been evaluated past and diagnosed with right knee osteoarthritic changes primarily involving the lateral compartment the right knee.  The patient has undergone Zilretta injections in the past with moderate relief however recently the Zilretta injection did not produce any significant relief from the right knee.  The patient has undergone viscosupplementation injections in addition to steroid injections without significant relief.  The patient does report a recent fall in which he developed worsening right knee pain but denies landing directly onto the right knee.  He reports a 5 out of 10 pain score in the right knee, he is taking Tylenol  as needed for discomfort.  Pain is primarily along the lateral aspect of the right knee.  He does report increased discomfort especially with trying to walk and stand.  The patient presents today with his wife.  This is primarily caregiver.  They would like to avoid any surgical intervention.  Current Outpatient Medications  Medication Sig Dispense Refill  . acetaminophen  (TYLENOL ) 325 MG tablet Take 650 mg by mouth every 4 (four) hours as needed for Pain    . alendronate  (FOSAMAX ) 70 MG tablet TAKE 1 TABLET BY MOUTH EVERY 7 DAYS TAKE WITH A FULL GLASS OF WATER. DO NOT LIE DOWN FOR 30 MIN 12 tablet 0  . aluminum & magnesium  hydroxide-simethicone , [facility]/DRH, (MYLANTA MAXIMUM) 400-400-40 mg/5 mL suspension Take 30 mLs by mouth    . bisacodyL  (DULCOLAX) 5 mg EC tablet Take 5 mg by mouth once daily as needed for Constipation    . calcium  carbonate-vitamin D3 (OYSCO 500/D) 500 mg-5 mcg (200 unit) tablet Take 1 tablet by mouth once daily    . cefpodoxime  (VANTIN ) 200 MG tablet Take 200 mg by mouth 2 (two) times daily    . cefUROXime  (CEFTIN ) 500 MG tablet Take 500 mg by mouth 2 (two) times daily with meals    . clindamycin (CLEOCIN) 150 MG capsule     .  clotrimazole-betamethasone (LOTRISONE) 1-0.05 % cream APPLY TOPICALLY 2 (TWO) TIMES DAILY TO DRY CRACKED SKIN ON FEET 30 g 1  . co-enzyme Q-10, ubiquinone, 50 mg capsule Take 50 mg by mouth 2 (two) times daily.    . cranberry extract 500 mg Cap Take by mouth    . diltiazem  (CARDIZEM ) 30 MG tablet Take 1 tablet (30 mg) by mouth once daily at bedtime    . finasteride  (PROSCAR ) 5 mg tablet Take 5 mg by mouth once daily    . flaxseed oil Oil Use 1 capsule once daily.    . flecainide  (TAMBOCOR ) 100 MG tablet Take 100 mg by mouth every 12 (twelve) hours as needed (as needed (atrial fibrillation or palpitations).)    . flecainide  (TAMBOCOR ) 50 MG tablet Take 50 mg by mouth 2 (two) times daily as needed    . fluticasone propion-salmeteroL (WIXELA INHUB) 250-50 mcg/dose diskus inhaler INHALE 1 PUFF INTO THE LUNGS 2 (TWO) TIMES DAILY AS NEEDED 60 each 2  . GEMTESA  75 mg Tab TAKE 1 TABLET BY MOUTH EVERY DAY 90 tablet 1  . ginger, Zingiber officinalis, 500 mg Cap Take 1 tablet by mouth once daily    . hydrocortisone (ANUSOL-HC) 25 mg suppository Place 1 suppository (25 mg total) rectally once daily as needed for Hemorrhoids 30 suppository 1  . Lactobacillus acidophilus Cap Take 1 capsule by mouth once daily.    . magnesium  oxide (MAG-OX)  400 mg (241.3 mg magnesium ) tablet Take 1 tablet (400 mg total) by mouth 2 (two) times daily 180 tablet 1  . multivitamin tablet Take 1 tablet by mouth once daily.    . MYRBETRIQ  25 mg ER Tablet Take 25 mg by mouth once daily    . NITROSTAT  0.4 mg SL tablet Place 1 tablet under the tongue every 5 (five) minutes as needed.  6  . omega 3-dha-epa-fish oil 1,000 mg (120 mg-180 mg) Cap Take by mouth    . pantoprazole  (PROTONIX ) 20 MG DR tablet TAKE 1 TABLET BY MOUTH EVERY DAY 90 tablet 3  . polyethylene glycol (MIRALAX ) powder Take 17 g by mouth once daily Mix in 4-8ounces of fluid prior to taking.    . saw palmetto  160 MG capsule Take 160 mg by mouth 3 (three) times daily    .  tamsulosin  (FLOMAX ) 0.4 mg capsule Take 0.4 mg by mouth 2 (two) times daily Take 30 minutes after same meal each day.    . triamcinolone acetonide (ZILRETTA) 32 mg intra-articular suspension Inject 1 vial into each knee.    . urea (CEROVEL) 40 % topical cream Apply topically 2 (two) times daily To thickened toe nails 90 g 2  . vitamin B12-folic acid 0.5-1 mg Tab Take by mouth once daily.    . vitamin C-vitamin E Cap Take 1 capsule by mouth once daily.    SABRA warfarin (COUMADIN ) 5 MG tablet Take 1/2 (one-half) tablet by mouth once daily 45 tablet 1   No current facility-administered medications for this visit.   Allergies  Allergen Reactions  . Ciprofloxacin Diarrhea  . Lanoxin [Digoxin] Other (See Comments)  . Lopressor [Metoprolol Tartrate] Other (See Comments)  . Penicillin Other (See Comments)  . Sulfa (Sulfonamide Antibiotics) Rash  . Verapamil Other (See Comments)   Past Medical History:  Diagnosis Date  . Anemia   . Anxiety   . Anxiety disorder 10/17/2013   Last Assessment & Plan:  Anxiety seems to be better controlled on today's visit. His son is doing well following valve surgery  . Atypical chest pain   . Cervical radiculitis 05/22/2014  . Chronic constipation   . Chronic prostatitis 08/31/2013  . DDD (degenerative disc disease), lumbar 05/22/2014  . Diverticulosis   . Gastric polyps 01/10/2015   negative biopsy  . GERD (gastroesophageal reflux disease)   . History of chronic fatigue syndrome   . History of migraine headaches   . History of proteinuria syndrome   . Hypertension   . 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 Irritable bowel syndrome   . Lumbar radiculitis 05/22/2014  . 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 Paroxysmal atrial fibrillation (CMS/HHS-HCC)   . Prostatitis    recurrent  . Skin cancer    left leg  . Syncope and collapse 12/07/2011   Last Assessment & Plan:  No recent syncope Last Assessment &  Plan:  No recent syncope   Past Surgical History:  Procedure Laterality Date  . COLONOSCOPY  09/10/1993  . SIGMOIDOSCOPY FLEXIBLE  11/05/1999  . CATARACT EXTRACTION Bilateral 02/2010  . UPPER GASTROINTESTINAL ENDOSCOPY  11/16/2011   +Barrett's  . EGD  01/10/2015   Negative EGD biopsy/No Repeat/PYO  . COLONOSCOPY    . right hip hemiarthroplasty Right    Dr. Tobie    Family History  Problem Relation Name Age of Onset  . Breast cancer Mother    . Appendicitis Mother    .  Cholelithiasis Mother    . High blood pressure (Hypertension) Mother    . Arthritis Mother    . Arthritis Father    . Pneumonia Father    . Prostate cancer Father    . No Known Problems Maternal Grandmother    . No Known Problems Maternal Grandfather    . No Known Problems Paternal Grandmother    . No Known Problems Paternal Grandfather    . Diabetes type II Son      Social History   Socioeconomic History  . Marital status: Married  Tobacco Use  . Smoking status: Never  . Smokeless tobacco: Never  Vaping Use  . Vaping status: Never Used  Substance and Sexual Activity  . Alcohol use: Never    Comment: socially  . Drug use: No  . Sexual activity: Defer  Social History Narrative   Exercise:  Exercises almost daily for 45 minutes.   Diet:  Red meat once a week.  Fast foods none.  Fried foods none.   Pneumovax:2010   Social Drivers of Corporate Investment Banker Strain: Low Risk  (08/10/2023)   Overall Financial Resource Strain (CARDIA)   . Difficulty of Paying Living Expenses: Not hard at all  Food Insecurity: No Food Insecurity (10/02/2023)   [facility]   Hunger Vital Sign   . Within the past 12 months, you worried that your food would run out before you got the money to buy more.: Never true   . Within the past 12 months, the food you bought just didn't last and you didn't have money to get more.: Never true  Transportation Needs: No Transportation Needs (10/02/2023)   Received from  [facility] - Transportation   . Lack of Transportation (Medical): No   . Lack of Transportation (Non-Medical): No    Review of Systems:  A comprehensive 14 point ROS was performed, reviewed, and the pertinent orthopaedic findings are documented in the HPI.  Exam: Vitals:   10/19/24 1604  BP: 120/80  Weight: 88.1 kg (194 lb 3.2 oz)  Height: 172.7 cm (5' 8)  PainSc:   5  PainLoc: Knee   General/Constitutional: The patient appears to be well-nourished, well-developed, and in no acute distress. Neuro/Psych: Normal mood and affect, oriented to person, place and time. Eyes: Non-icteric.  Pupils are equal, round, and reactive to light, and exhibit synchronous movement. ENT: Unremarkable. Lymphatic: No palpable adenopathy. Respiratory: No wheezes and Non-labored breathing Cardiovascular: No edema, swelling or tenderness, except as noted in detailed exam. Integumentary: No impressive skin lesions present, except as noted in detailed exam. Musculoskeletal: Unremarkable, except as noted in detailed exam.  The patient presents today using a  cane for assistance with ambulation.  He denies any pain with logroll maneuver of the right hip. Skin examination of the right knee demonstrates no open wound, erythema or ecchymosis.  The patient does have a moderate right knee effusion.  The patient is able to fully extend the right knee with mild underlying crepitus, right knee flexion close to 95 degrees.  The right knee is stable to varus and valgus stress testing at today's visit.  The patient does have moderate pain on palpation along the lateral joint line of the right knee.  The patient denies significant tenderness to palpation along the medial joint line.  He is intact light touch on the right lower extremity.  Cap refills intact to each individual toe.  Dorsalis pedis and posterior tibialis pulse are  intact to the right lower extremity.  X-rays/MRI/Lab data:  AP, lateral and sunrise  views of the right knee were obtained today in the office and reviewed by me.  These x-rays do demonstrate severe right knee osteoarthritic changes primarily involving the lateral compartment of the right knee.  The patient is bone-on-bone along the lateral compartment with underlying osteophyte formation along with subchondral sclerosis.  No acute fractures identified.  Moderate effusion is identified.   Assessment: Encounter Diagnoses  Name Primary?  . Primary osteoarthritis of right knee Yes  . Effusion of right knee   . Chronic pain of right knee     Plan: 1.  Treatment options were discussed today with the patient and his wife. 2.  X-rays of the right knee demonstrate severe right knee osteoarthritic changes involving the lateral compartment of the right knee. 3.  The patient was offered to receive a right knee aspiration, Ace wrap was applied to the right knee. 4.  The family would still like to avoid any surgical intervention at this time.  A referral to physiatry for potential genicular nerve ablation was placed for the patient. 5.  He can contact the clinic if he has any questions, new symptoms well or symptoms worsen.  Knee Aspiration Procedure Note  Pre-operative Diagnosis: right knee pain/arthritis with effusion  Post-operative Diagnosis: right knee pain/arthritis with effusion  Indications: Knee effusion  Timeout:    The procedure was reviewed with the patient including anesthesia, home care after the procedure and follow up plans. He was informed of the possible risks, including bleeding and infection, as well as the benefits. Potential complications and alternatives were explained to the patient. Written informed consent was obtained and a time-out was performed. The patient and spouse verbalized understanding and gave verbal implied consent and a time-out was performed. The site was marked with a surgical pen in accordance with [facility] policies and procedures.  Procedure  Details  The joint was prepped with Betadine. A 22 gauge needle was inserted using an superior lateral approach. 3 milliliters of 1% lidocaine  without epinephrine was injected into the joint space and along the tract. Then, an 18 gauge needle was inserted into the joint space and 50 mL of straw-colored synovial fluid was drawn from the knee. The needle was removed and the area cleansed and dressed.  Complications:    None; patient tolerated the procedure well  Plan: Skin care discussed.  Encouraged the patient keep the skin clean and dry.  Encouraged cleaning the skin with an antibacterial soap and warm water twice a day. Do not use topical Neosporin , hydrogen peroxide or alcohol.  The patient may cover the area if going out as to prevent infection or irritation.   Limit over usage of the lower extremities for the next several days.  Patient agrees.  DOROTHA Gustavo Level, PA-C, CAQ-OS [facility]

## 2024-10-29 ENCOUNTER — Emergency Department

## 2024-10-29 ENCOUNTER — Inpatient Hospital Stay
Admission: EM | Admit: 2024-10-29 | Discharge: 2024-10-31 | DRG: 291 | Disposition: A | Discharge status: Home Health | Attending: Student in an Organized Health Care Education/Training Program | Admitting: Student in an Organized Health Care Education/Training Program

## 2024-10-29 ENCOUNTER — Other Ambulatory Visit: Payer: Self-pay

## 2024-10-29 DIAGNOSIS — I110 Hypertensive heart disease with heart failure: Principal | ICD-10-CM | POA: Diagnosis present

## 2024-10-29 DIAGNOSIS — R0602 Shortness of breath: Secondary | ICD-10-CM | POA: Diagnosis present

## 2024-10-29 DIAGNOSIS — Z9181 History of falling: Secondary | ICD-10-CM | POA: Diagnosis not present

## 2024-10-29 DIAGNOSIS — Z882 Allergy status to sulfonamides status: Secondary | ICD-10-CM | POA: Diagnosis not present

## 2024-10-29 DIAGNOSIS — I700 Atherosclerosis of aorta: Secondary | ICD-10-CM | POA: Diagnosis present

## 2024-10-29 DIAGNOSIS — Z880 Allergy status to penicillin: Secondary | ICD-10-CM

## 2024-10-29 DIAGNOSIS — I509 Heart failure, unspecified: Secondary | ICD-10-CM | POA: Diagnosis present

## 2024-10-29 DIAGNOSIS — E871 Hypo-osmolality and hyponatremia: Secondary | ICD-10-CM | POA: Diagnosis present

## 2024-10-29 DIAGNOSIS — K580 Irritable bowel syndrome with diarrhea: Secondary | ICD-10-CM | POA: Diagnosis present

## 2024-10-29 DIAGNOSIS — M7989 Other specified soft tissue disorders: Secondary | ICD-10-CM | POA: Diagnosis present

## 2024-10-29 DIAGNOSIS — K219 Gastro-esophageal reflux disease without esophagitis: Secondary | ICD-10-CM | POA: Diagnosis present

## 2024-10-29 DIAGNOSIS — Z7983 Long term (current) use of bisphosphonates: Secondary | ICD-10-CM | POA: Diagnosis not present

## 2024-10-29 DIAGNOSIS — Z96641 Presence of right artificial hip joint: Secondary | ICD-10-CM | POA: Diagnosis present

## 2024-10-29 DIAGNOSIS — D649 Anemia, unspecified: Secondary | ICD-10-CM | POA: Diagnosis present

## 2024-10-29 DIAGNOSIS — R0609 Other forms of dyspnea: Secondary | ICD-10-CM | POA: Diagnosis present

## 2024-10-29 DIAGNOSIS — I10 Essential (primary) hypertension: Secondary | ICD-10-CM | POA: Diagnosis present

## 2024-10-29 DIAGNOSIS — Z79899 Other long term (current) drug therapy: Secondary | ICD-10-CM

## 2024-10-29 DIAGNOSIS — R918 Other nonspecific abnormal finding of lung field: Secondary | ICD-10-CM | POA: Diagnosis present

## 2024-10-29 DIAGNOSIS — I5031 Acute diastolic (congestive) heart failure: Secondary | ICD-10-CM | POA: Diagnosis not present

## 2024-10-29 DIAGNOSIS — I951 Orthostatic hypotension: Secondary | ICD-10-CM | POA: Diagnosis present

## 2024-10-29 DIAGNOSIS — I2510 Atherosclerotic heart disease of native coronary artery without angina pectoris: Secondary | ICD-10-CM | POA: Diagnosis present

## 2024-10-29 DIAGNOSIS — I5023 Acute on chronic systolic (congestive) heart failure: Secondary | ICD-10-CM | POA: Diagnosis not present

## 2024-10-29 DIAGNOSIS — I428 Other cardiomyopathies: Secondary | ICD-10-CM | POA: Diagnosis present

## 2024-10-29 DIAGNOSIS — N400 Enlarged prostate without lower urinary tract symptoms: Secondary | ICD-10-CM | POA: Diagnosis present

## 2024-10-29 DIAGNOSIS — I5033 Acute on chronic diastolic (congestive) heart failure: Secondary | ICD-10-CM | POA: Diagnosis present

## 2024-10-29 DIAGNOSIS — E785 Hyperlipidemia, unspecified: Secondary | ICD-10-CM | POA: Diagnosis present

## 2024-10-29 DIAGNOSIS — Z881 Allergy status to other antibiotic agents status: Secondary | ICD-10-CM

## 2024-10-29 DIAGNOSIS — I480 Paroxysmal atrial fibrillation: Secondary | ICD-10-CM | POA: Diagnosis present

## 2024-10-29 DIAGNOSIS — Z7901 Long term (current) use of anticoagulants: Secondary | ICD-10-CM | POA: Diagnosis not present

## 2024-10-29 DIAGNOSIS — I5032 Chronic diastolic (congestive) heart failure: Secondary | ICD-10-CM | POA: Diagnosis present

## 2024-10-29 LAB — BASIC METABOLIC PANEL WITH GFR
Anion gap: 7 (ref 5–15)
BUN: 18 mg/dL (ref 8–23)
CO2: 25 mmol/L (ref 22–32)
Calcium: 8.5 mg/dL — ABNORMAL LOW (ref 8.9–10.3)
Chloride: 101 mmol/L (ref 98–111)
Creatinine, Ser: 1.05 mg/dL (ref 0.61–1.24)
GFR, Estimated: >60 mL/min (ref >60)
Glucose, Bld: 98 mg/dL (ref 70–99)
Potassium: 4.5 mmol/L (ref 3.5–5.1)
Sodium: 133 mmol/L — ABNORMAL LOW (ref 135–145)

## 2024-10-29 LAB — BRAIN NATRIURETIC PEPTIDE: B Natriuretic Peptide: 502.9 pg/mL — ABNORMAL HIGH (ref 0.0–100.0)

## 2024-10-29 LAB — CBC
HCT: 33.1 % — ABNORMAL LOW (ref 39.0–52.0)
Hemoglobin: 11 g/dL — ABNORMAL LOW (ref 13.0–17.0)
MCH: 28 pg (ref 26.0–34.0)
MCHC: 33.2 g/dL (ref 30.0–36.0)
MCV: 84.2 fL (ref 80.0–100.0)
Platelets: 271 K/uL (ref 150–400)
RBC: 3.93 MIL/uL — ABNORMAL LOW (ref 4.22–5.81)
RDW: 14.6 % (ref 11.5–15.5)
WBC: 7 K/uL (ref 4.0–10.5)
nRBC: 0 % (ref 0.0–0.2)

## 2024-10-29 LAB — PROTIME-INR
INR: 2.7 — ABNORMAL HIGH (ref 0.8–1.2)
Prothrombin Time: 29.9 s — ABNORMAL HIGH (ref 11.4–15.2)

## 2024-10-29 LAB — D-DIMER, QUANTITATIVE: D-Dimer, Quant: <0.27 ug{FEU}/mL (ref 0.00–0.50)

## 2024-10-29 LAB — TROPONIN I (HIGH SENSITIVITY): Troponin I (High Sensitivity): 10 ng/L (ref <18)

## 2024-10-29 LAB — MAGNESIUM: Magnesium: 2.1 mg/dL (ref 1.7–2.4)

## 2024-10-29 MED ORDER — TAMSULOSIN HCL 0.4 MG PO CAPS
0.4000 mg | ORAL_CAPSULE | Freq: Every day | ORAL | Status: DC
  Administered 2024-10-29 – 2024-10-30 (×2): 0.4 mg via ORAL
  Filled 2024-10-29 (×2): qty 1

## 2024-10-29 MED ORDER — ACETAMINOPHEN 325 MG PO TABS: 650.0000 mg | ORAL_TABLET | Freq: Four times a day (QID) | ORAL | Status: DC | PRN

## 2024-10-29 MED ORDER — FUROSEMIDE 10 MG/ML IJ SOLN
40.0000 mg | Freq: Once | INTRAMUSCULAR | Status: AC
  Administered 2024-10-29: 40 mg via INTRAVENOUS
  Filled 2024-10-29: qty 4

## 2024-10-29 MED ORDER — FINASTERIDE 5 MG PO TABS
5.0000 mg | ORAL_TABLET | Freq: Every day | ORAL | Status: DC
  Administered 2024-10-30 – 2024-10-31 (×2): 5 mg via ORAL
  Filled 2024-10-29 (×2): qty 1

## 2024-10-29 MED ORDER — SODIUM CHLORIDE 0.9% FLUSH
3.0000 mL | Freq: Two times a day (BID) | INTRAVENOUS | Status: DC
  Administered 2024-10-29 – 2024-10-31 (×4): 3 mL via INTRAVENOUS

## 2024-10-29 MED ORDER — SENNOSIDES-DOCUSATE SODIUM 8.6-50 MG PO TABS: 1.0000 | ORAL_TABLET | Freq: Every evening | ORAL | Status: DC | PRN

## 2024-10-29 MED ORDER — PANTOPRAZOLE SODIUM 20 MG PO TBEC
20.0000 mg | DELAYED_RELEASE_TABLET | Freq: Every day | ORAL | Status: DC
  Administered 2024-10-30 – 2024-10-31 (×2): 20 mg via ORAL
  Filled 2024-10-29 (×2): qty 1

## 2024-10-29 MED ORDER — FUROSEMIDE 10 MG/ML IJ SOLN
40.0000 mg | Freq: Two times a day (BID) | INTRAMUSCULAR | Status: DC
Start: 2024-10-30 — End: 2024-11-01
  Administered 2024-10-30 – 2024-10-31 (×3): 40 mg via INTRAVENOUS
  Filled 2024-10-29 (×3): qty 4

## 2024-10-29 MED ORDER — MAGNESIUM OXIDE 400 MG PO TABS
400.0000 mg | ORAL_TABLET | Freq: Two times a day (BID) | ORAL | Status: DC
  Administered 2024-10-29 – 2024-10-31 (×4): 400 mg via ORAL
  Filled 2024-10-29 (×9): qty 1

## 2024-10-29 MED ORDER — ACETAMINOPHEN 650 MG RE SUPP: 650.0000 mg | Freq: Four times a day (QID) | RECTAL | Status: DC | PRN

## 2024-10-29 NOTE — ED Triage Notes (Signed)
 Pt comes with sob and abnormal xray showing fluid buildup. Pt denies any hx of CHF. Pt states this started 3 days. Pt states no cp.

## 2024-10-29 NOTE — H&P (Addendum)
 History and Physical    [name] FMW:[id number] DOB: 03/12/1933 DOA: 10/29/2024  DOS: the patient was seen and examined on 10/29/2024  PCP: Glover Lenis, MD   Patient coming from: Home  I have personally briefly reviewed patient's old medical records in [facility] and CareEverywhere  HPI:   Robert Burch is a 89 y.o. year old male with medical history of HTN, HLD, Paroxysmal Afib, and BPH presenting to the ED from urgent care for shortness of breath.    Pt states he has been having worsening exertional dyspnea over the last couple of weeks.  This is worse with incline and with long distances.  He is quite active at baseline and states he gets exertional dyspnea after 100 yards but before he had no exertional dyspnea.  He takes his medications regularly.  Denies any other symptom suggestive of illness or infection.   On arrival to the ED patient was noted to be HDS stable. CBC and imaging obtained. CBC shows normal leukocyte count and mild anemia, BMP with mild hyponatremia, BNP elevated at 502 and CXR shows signs of vascular congestion. Given this TRH contacted for admission.  Review of Systems: As mentioned in the history of present illness. All other systems reviewed and are negative.   Past Medical History:  Diagnosis Date   Atypical chest pain    a. Nonobstructive, very minor irregs by cath 2007;  b. 06/2009 Ex MV: small area of apical lateral ischemia;  c. 06/2009 Cath: essentially normal;  d. 04/2014 Lexiscan  MV: EF 59%, no ischemia/infarct->low risk.   Cancer (HCC)    Chronic anticoagulation    a. Chronic Coumadin    Chronic anxiety    Diverticulosis    Dysrhythmia    atrial fib   GERD (gastroesophageal reflux disease)    Hypertension    IBS (irritable bowel syndrome)    Nonischemic cardiomyopathy (HCC)    a. EF 45% by echo 2010;  b. 05/2012 Echo: EF 55-65%, no rwma, Gr 1 DD, mild AI/MR, mildly dil LA.   Paralyzed vocal cords    Paroxysmal  atrial fibrillation (HCC)    a. Treated with flecainide    Proteinuria    History of    Past Surgical History:  Procedure Laterality Date   CARDIAC CATHETERIZATION     cataract surgery Bilateral    HIP ARTHROPLASTY Right 03/17/2020   Procedure: ARTHROPLASTY BIPOLAR HIP (HEMIARTHROPLASTY);  Surgeon: [name], MD;  Location: [facility] ORS;  Service: Orthopedics;  Laterality: Right;   MASS BIOPSY  2013     Allergies  Allergen Reactions   Ciprofloxacin     Diarrhea and stomach pains   Lanoxin [Digoxin]    Lopressor [Metoprolol Tartrate]    Penicillins     Other reaction(s): Unknown   Sulfonamide Derivatives     Rash    Verapamil    Sulfa Antibiotics Rash    Family History  Problem Relation Age of Onset   Heart attack Neg Hx     Prior to Admission medications   Medication Sig Start Date End Date Taking? Authorizing Provider  acetaminophen  (TYLENOL ) 325 MG tablet Take 650 mg by mouth 4 (four) times daily.    [provider]  acidophilus (RISAQUAD) CAPS capsule Take 1 capsule by mouth daily.    [provider]  alendronate  (FOSAMAX ) 70 MG tablet Take 1 tablet (70 mg total) by mouth once a week. 11/16/23   Caro Harlene POUR, NP  alum & mag hydroxide-simeth (MAALOX PLUS) 400-400-40 MG/5ML  suspension Take 30 mLs by mouth every 4 (four) hours as needed for indigestion.    [provider]  Benzocaine-Menthol  (ORAJEL 2X TOOTHACHE & GUM) 20-0.26 % GEL Use as directed in the mouth or throat every 6 (six) hours as needed.    [provider]  bisacodyl  (DULCOLAX) 5 MG EC tablet Take 1 tablet (5 mg total) by mouth daily as needed for moderate constipation. 10/05/23   Josette Ade, MD  calcium -vitamin D (OSCAL WITH D) 500-5 MG-MCG tablet Take 1 tablet by mouth daily. 10/06/23   Josette Ade, MD  cefpodoxime  (VANTIN ) 200 MG tablet Take 1 tablet (200 mg total) by mouth 2 (two) times daily. 07/24/24   Helon Kirsch A, PA-C  Cholecalciferol  (VITAMIN  D3) 400 UNITS CAPS Take 800 Units by mouth daily.     [provider]  clotrimazole-betamethasone (LOTRISONE) cream Apply 1 Application topically as needed. 09/20/23   [provider]  Cranberry 500 MG CAPS Take 500 mg by mouth daily.    [provider]  diltiazem  (CARDIZEM ) 30 MG tablet TAKE 1 TABLET (30 MG) BY MOUTH ONCE DAILY AT BEDTIME 01/04/24   Fernande Elspeth BROCKS, MD  doxycycline  (VIBRAMYCIN ) 100 MG capsule Take 1 capsule (100 mg total) by mouth every 12 (twelve) hours. Patient not taking: Reported on 08/15/2024 07/12/24   Helon Kirsch A, PA-C  finasteride  (PROSCAR ) 5 MG tablet Take 1 tablet (5 mg total) by mouth daily. 01/06/24   Francisca Redell BROCKS, MD  Flaxseed, Linseed, (FLAX SEED OIL) 1000 MG CAPS Take 1,000 mg by mouth daily.     [provider]  flecainide  (TAMBOCOR ) 100 MG tablet Take 1 tablet (100 mg total) by mouth every 12 (twelve) hours as needed (atrial fibrillation or palpitations). 11/16/23   Eubanks, Jessica K, NP  hydrocortisone (ANUSOL-HC) 25 MG suppository Place 25 mg rectally daily as needed for hemorrhoids or anal itching.    [provider]  lidocaine  4 % Place 1 patch onto the skin. As needed    [provider]  magnesium  oxide (MAG-OX) 400 MG tablet Take 1 tablet (400 mg total) by mouth 2 (two) times daily. 08/15/24   [name], Timothy J, MD  Multiple Vitamin (MULTIVITAMIN) tablet Take 1 tablet by mouth daily.      [provider]  Omega-3 Fatty Acids (FISH OIL) 1000 MG CAPS Take 1,000 mg by mouth daily.     [provider]  pantoprazole  (PROTONIX ) 20 MG tablet Take 20 mg by mouth daily. 07/31/24   [provider]  polyethylene glycol (MIRALAX  / GLYCOLAX ) packet Take 17 g by mouth daily as needed for mild constipation or moderate constipation.    [provider]  tamsulosin  (FLOMAX ) 0.4 MG CAPS capsule One capsule po at bedtime (start on 10/07/23) 08/15/24   [name], Timothy J, MD   Triamcinolone Acetonide (ZILRETTA) 32 MG SRER intra-articular injection Inject 1 vial into each knee. 11/17/23   [provider]  Vibegron  (GEMTESA ) 75 MG TABS Take 1 tablet (75 mg total) by mouth daily. Patient not taking: Reported on 08/15/2024 01/06/24   Francisca Redell BROCKS, MD  warfarin (COUMADIN ) 2.5 MG tablet By mouth daily on tuesdays 11/16/23   Caro Harlene POUR, NP    Social History:  reports that he has never smoked. He has never used smokeless tobacco. He reports that he does not drink alcohol and does not use drugs. Lives with wife Tobacco- Denies use. EtOH- Denies use.  Illicit drug use- denies use.  IADLs/ADLs- can  perform independently at baseline    Physical Exam: Vitals:   10/29/24 1600 10/29/24 1700 10/29/24 1830 10/29/24 1912  BP: (!) 156/70 (!) 175/79 (!) 142/69   Pulse: 63 69 (!) 59   Resp: 18 16 15    Temp:    97.8 F (36.6 C)  TempSrc:    Oral  SpO2: 99% 96% 91%   Weight:      Height:         Physical Exam General: NAD HENT: NCAT Lungs: rales up to mid lung Cardiovascular: sinus rhythm and rate, 1 + LE edema Abdomen: No TTP, normal bowel sounds MSK: No asymmetry or muscle atrophy.  Skin: no lesions noted on exposed skin Neuro: Alert and oriented x4. CN grossly intact Psych: Normal mood and normal affect    Labs on Admission: I have personally reviewed following labs and imaging studies  CBC: Recent Labs  Lab 10/29/24 1513  WBC 7.0  HGB 11.0*  HCT 33.1*  MCV 84.2  PLT 271   Basic Metabolic Panel: Recent Labs  Lab 10/29/24 1513  NA 133*  K 4.5  CL 101  CO2 25  GLUCOSE 98  BUN 18  CREATININE 1.05  CALCIUM  8.5*  MG 2.1   GFR: Estimated Creatinine Clearance: 53.5 mL/min (by C-G formula based on SCr of 1.05 mg/dL). Liver Function Tests: No results for input(s): AST, ALT, ALKPHOS, BILITOT, PROT, ALBUMIN in the last 168 hours. No results for input(s): LIPASE, AMYLASE in the last 168 hours. No results for  input(s): AMMONIA in the last 168 hours. Coagulation Profile: Recent Labs  Lab 10/29/24 1513  INR 2.7*   Cardiac Enzymes: Recent Labs  Lab 10/29/24 1513  TROPONINIHS 10   BNP (last 3 results) Recent Labs    10/29/24 1513  BNP 502.9*   HbA1C: No results for input(s): HGBA1C in the last 72 hours. CBG: No results for input(s): GLUCAP in the last 168 hours. Lipid Profile: No results for input(s): CHOL, HDL, LDLCALC, TRIG, CHOLHDL, LDLDIRECT in the last 72 hours. Thyroid Function Tests: No results for input(s): TSH, T4TOTAL, FREET4, T3FREE, THYROIDAB in the last 72 hours. Anemia Panel: No results for input(s): VITAMINB12, FOLATE, FERRITIN, TIBC, IRON, RETICCTPCT in the last 72 hours. Urine analysis:    Component Value Date/Time   APPEARANCEUR Cloudy (A) 07/12/2024 0825   GLUCOSEU Negative 07/12/2024 0825   BILIRUBINUR Negative 07/12/2024 0825   PROTEINUR 1+ (A) 07/12/2024 0825   NITRITE Negative 07/12/2024 0825   LEUKOCYTESUR 3+ (A) 07/12/2024 0825    Radiological Exams on Admission: I have personally reviewed images DG Chest 2 View Result Date: 10/29/2024 EXAM: 2 VIEW(S) XRAY OF THE CHEST 10/29/2024 04:35:00 PM COMPARISON: None available. CLINICAL HISTORY: dyspnea FINDINGS: LUNGS AND PLEURA: Pulmonary vascular congestion. Small bilateral pleural effusions. No pulmonary edema. No pneumothorax. HEART AND MEDIASTINUM: Cardiomegaly. Aortic atherosclerosis. BONES AND SOFT TISSUES: No acute osseous abnormality. IMPRESSION: 1. Cardiomegaly with pulmonary vascular congestion and small bilateral pleural effusions. Electronically signed by: Norman Gatlin MD 10/29/2024 04:44 PM EST RP Workstation: [id number]    EKG: My personal interpretation of EKG shows: NSR without any acute ST changes.    Assessment/Plan Principal Problem:   CHF exacerbation (HCC) Active Problems:   Paroxysmal atrial fibrillation (HCC)   Essential hypertension   GERD  (gastroesophageal reflux disease)   BPH (benign prostatic hyperplasia)   Hyperlipidemia   Acute Heart Failure Exacerbation Pt with hx of CHF and signs of volume overload on exam and imaging suggestive of CHF exacerbation. Holding home  CCB - Admit to med-telemetry - Start IV Furosemide 40 mg BID  - Trend BMP, follow mag (goal K>4 and Mag>2) - Strict I&Os - Daily Weights - Fluid restriction to 2 L, limit salt intake. Educate on importance of both.  - Keep O2 sat >88 -Get iron studies, replete if Fe <100 or Tsat <20 % with iron <300  Paroxysmal Afib: in NSR. CTM. Continue Warfarin. Likely on warfarin due to cost but did not confirm why he is on this over DOAC. Holding CCB  HTN: Holding home meds, CTM while diuresing   BPH: continue home meds  GERD: continue home meds    VTE prophylaxis:  Warfarin Diet: HH Code Status:  Full Code Telemetry:  Admission status: Inpatient, Telemetry bed Patient is from: Home Anticipated d/c is to: Home Anticipated d/c is in: 2 days   Family Communication: Updated at bedside  Consults called: None   Severity of Illness: The appropriate patient status for this patient is INPATIENT. Inpatient status is judged to be reasonable and necessary in order to provide the required intensity of service to ensure the patient's safety. The patient's presenting symptoms, physical exam findings, and initial radiographic and laboratory data in the context of their chronic comorbidities is felt to place them at high risk for further clinical deterioration. Furthermore, it is not anticipated that the patient will be medically stable for discharge from the hospital within 2 midnights of admission.   * I certify that at the point of admission it is my clinical judgment that the patient will require inpatient hospital care spanning beyond 2 midnights from the point of admission due to high intensity of service, high risk for further deterioration and high frequency of  surveillance required.DEWAINE Morene Bathe, MD Jolynn DEL. [facility]

## 2024-10-29 NOTE — ED Provider Notes (Signed)
 [facility] Provider Note    Event Date/Time   First MD Initiated Contact with Patient 10/29/24 1551     (approximate)   History   Shortness of Breath   HPI {Remember to add pertinent medical, surgical, social, and/or OB history to HPI:1} Robert Burch is a 89 y.o. male  ***       Physical Exam   Triage Vital Signs: ED Triage Vitals  Encounter Vitals Group     BP 10/29/24 1507 (!) 149/69     Girls Systolic BP Percentile --      Girls Diastolic BP Percentile --      Boys Systolic BP Percentile --      Boys Diastolic BP Percentile --      Pulse Rate 10/29/24 1507 61     Resp 10/29/24 1507 19     Temp 10/29/24 1507 97.7 F (36.5 C)     Temp src --      SpO2 10/29/24 1507 100 %     Weight 10/29/24 1508 195 lb (88.5 kg)     Height 10/29/24 1508 5' 9 (1.753 m)     Head Circumference --      Peak Flow --      Pain Score 10/29/24 1507 0     Pain Loc --      Pain Education --      Exclude from Growth Chart --     Most recent vital signs: Vitals:   10/29/24 1507  BP: (!) 149/69  Pulse: 61  Resp: 19  Temp: 97.7 F (36.5 C)  SpO2: 100%    {Only need to document appropriate and relevant physical exam:1} General: Awake, no distress. *** CV:  Good peripheral perfusion. *** Resp:  Normal effort. *** Abd:  No distention. *** Other:  ***   ED Results / Procedures / Treatments   Labs (all labs ordered are listed, but only abnormal results are displayed) Labs Reviewed  CBC - Abnormal; Notable for the following components:      Result Value   RBC 3.93 (*)    Hemoglobin 11.0 (*)    HCT 33.1 (*)    All other components within normal limits  RESP PANEL BY RT-PCR (RSV, FLU A&B, COVID)  RVPGX2  BASIC METABOLIC PANEL WITH GFR  BRAIN NATRIURETIC PEPTIDE  TROPONIN I (HIGH SENSITIVITY)     EKG  ***   RADIOLOGY *** {USE THE WORD INTERPRETED!! You MUST document your own interpretation of imaging, as well as the fact that  you reviewed the radiologist's report!:1}   PROCEDURES:  Critical Care performed: {CriticalCareYesNo:19197::Yes, see critical care procedure note(s),No}  Procedures   MEDICATIONS ORDERED IN ED: Medications - No data to display   IMPRESSION / MDM / ASSESSMENT AND PLAN / ED COURSE  I reviewed the triage vital signs and the nursing notes.                              Differential diagnosis includes, but is not limited to, ***  Patient's presentation is most consistent with {EM COPA:27473}  *** {If the patient is on the monitor, remove the brackets and asterisks on the sentence below and remember to document it as a Procedure as well. Otherwise delete the sentence below:1} {**The patient is on the cardiac monitor to evaluate for evidence of arrhythmia and/or significant heart rate changes.**} {Remember to include, when applicable, any/all of the following data: independent  review of imaging independent review of labs (comment specifically on pertinent positives and negatives) review of specific prior hospitalizations, PCP/specialist notes, etc. discuss meds given and prescribed document any discussion with consultants (including hospitalists) any clinical decision tools you used and why (PECARN, NEXUS, etc.) did you consider admitting the patient? document social determinants of health affecting patient's care (homelessness, inability to follow up in a timely fashion, etc) document any pre-existing conditions increasing risk on current visit (e.g. diabetes and HTN increasing danger of high-risk chest pain/ACS) describes what meds you gave (especially parenteral) and why any other interventions?:1} Clinical Course as of 10/29/24 1627  Sun Oct 29, 2024  1600 Per Maryl MD, CXR final read is  increased lung markings and vascular congestion bilaterally [MQ]    Clinical Course User Index [MQ] Dicky Anes, MD     FINAL CLINICAL IMPRESSION(S) / ED DIAGNOSES   Final  diagnoses:  None     Rx / DC Orders   ED Discharge Orders     None        Note:  This document was prepared using Dragon voice recognition software and may include unintentional dictation errors.

## 2024-10-29 NOTE — ED Triage Notes (Signed)
 First nurse note: Pt to ED via POV from [facility]. Pt sent after abnormal xray showing fluid accumulation. Pt sent for heart failure work up

## 2024-10-29 NOTE — ED Provider Notes (Signed)
 [facility] Provider Note    Event Date/Time   First MD Initiated Contact with Patient 10/29/24 1551     (approximate)   History   Shortness of Breath   HPI {Remember to add pertinent medical, surgical, social, and/or OB history to HPI:1} Robert Burch is a 89 y.o. male  has a past medical history of Atypical chest pain, Cancer (HCC), Chronic anticoagulation, Chronic anxiety, Diverticulosis, Dysrhythmia, GERD (gastroesophageal reflux disease), Hypertension, IBS (irritable bowel syndrome), Nonischemic cardiomyopathy (HCC), Paralyzed vocal cords, Paroxysmal atrial fibrillation (HCC), and Proteinuria.   ***       Physical Exam   Triage Vital Signs: ED Triage Vitals  Encounter Vitals Group     BP 10/29/24 1507 (!) 149/69     Girls Systolic BP Percentile --      Girls Diastolic BP Percentile --      Boys Systolic BP Percentile --      Boys Diastolic BP Percentile --      Pulse Rate 10/29/24 1507 61     Resp 10/29/24 1507 19     Temp 10/29/24 1507 97.7 F (36.5 C)     Temp src --      SpO2 10/29/24 1507 100 %     Weight 10/29/24 1508 195 lb (88.5 kg)     Height 10/29/24 1508 5' 9 (1.753 m)     Head Circumference --      Peak Flow --      Pain Score 10/29/24 1507 0     Pain Loc --      Pain Education --      Exclude from Growth Chart --     Most recent vital signs: Vitals:   10/29/24 1507 10/29/24 1545  BP: (!) 149/69 (!) 158/80  Pulse: 61 (!) 57  Resp: 19 15  Temp: 97.7 F (36.5 C)   SpO2: 100% 97%    {Only need to document appropriate and relevant physical exam:1} General: Awake, no distress. *** CV:  Good peripheral perfusion. *** Resp:  Normal effort. *** Abd:  No distention. *** Other:  ***   ED Results / Procedures / Treatments   Labs (all labs ordered are listed, but only abnormal results are displayed) Labs Reviewed  BASIC METABOLIC PANEL WITH GFR - Abnormal; Notable for the following components:      Result  Value   Sodium 133 (*)    Calcium  8.5 (*)    All other components within normal limits  CBC - Abnormal; Notable for the following components:   RBC 3.93 (*)    Hemoglobin 11.0 (*)    HCT 33.1 (*)    All other components within normal limits  BRAIN NATRIURETIC PEPTIDE - Abnormal; Notable for the following components:   B Natriuretic Peptide 502.9 (*)    All other components within normal limits  RESP PANEL BY RT-PCR (RSV, FLU A&B, COVID)  RVPGX2  D-DIMER, QUANTITATIVE  TROPONIN I (HIGH SENSITIVITY)     EKG  ***   RADIOLOGY *** {USE THE WORD INTERPRETED!! You MUST document your own interpretation of imaging, as well as the fact that you reviewed the radiologist's report!:1}   PROCEDURES:  Critical Care performed: {CriticalCareYesNo:19197::Yes, see critical care procedure note(s),No}  Procedures   MEDICATIONS ORDERED IN ED: Medications - No data to display   IMPRESSION / MDM / ASSESSMENT AND PLAN / ED COURSE  I reviewed the triage vital signs and the nursing notes.  Differential diagnosis includes, but is not limited to, ***  Patient's presentation is most consistent with {EM COPA:27473}  *** {If the patient is on the monitor, remove the brackets and asterisks on the sentence below and remember to document it as a Procedure as well. Otherwise delete the sentence below:1} {**The patient is on the cardiac monitor to evaluate for evidence of arrhythmia and/or significant heart rate changes.**} {Remember to include, when applicable, any/all of the following data: independent review of imaging independent review of labs (comment specifically on pertinent positives and negatives) review of specific prior hospitalizations, PCP/specialist notes, etc. discuss meds given and prescribed document any discussion with consultants (including hospitalists) any clinical decision tools you used and why (PECARN, NEXUS, etc.) did you consider  admitting the patient? document social determinants of health affecting patient's care (homelessness, inability to follow up in a timely fashion, etc) document any pre-existing conditions increasing risk on current visit (e.g. diabetes and HTN increasing danger of high-risk chest pain/ACS) describes what meds you gave (especially parenteral) and why any other interventions?:1} Clinical Course as of 10/29/24 1601  Sun Oct 29, 2024  1600 Per Maryl MD, CXR final read is  increased lung markings and vascular congestion bilaterally [MQ]    Clinical Course User Index [MQ] Dicky Anes, MD     FINAL CLINICAL IMPRESSION(S) / ED DIAGNOSES   Final diagnoses:  None     Rx / DC Orders   ED Discharge Orders     None        Note:  This document was prepared using Dragon voice recognition software and may include unintentional dictation errors.

## 2024-10-29 NOTE — Progress Notes (Signed)
 [facility] Chief Complaint:   Chief Complaint  Patient presents with  . Cough    Patient states that these symptoms have been going for about 2 days but only has been taking tylenol   . Shortness of Breath  . Leg Swelling    History of Present Illness:    Robert Burch is a 89 y.o. male that presents to the walk in clinic today for evaluation and management of cough, shortness of breath, leg swelling.  He is accompanied by his wife.  At the time of this visit, I reviewed last INR 2.5 on 09/28/2024.  He has had a remote echocardiogram from 10/29/2020 that reported normal left ventricular function with mild left ventricular hypertrophy and ejection fraction 55-60%, right ventricular systolic function is normal.  His last evaluation by cardiology was on 08/15/2024 for 33-month follow-up where he was reporting some increased shortness of breath with walking up inclines.  He was recommended to wear thigh-high compression stockings and was suspected to have chronic shortness of breath due to deconditioning.  His wife reports his symptoms began about 1 week ago with bilateral lower extremity swelling.  He then developed a cough over the past 3 days and then shortness of breath.  She denies fevers or chills, night sweats, chest pain, abdominal pain, nausea/vomiting, congestion, ear pain, dizziness, numbness or tingling, weakness.  He reportedly does not take any diuretics because of increased urinary frequency.  Medications, Past Medical/Surgical/Family/Social History:   Past Medical History:  Past Medical History:  Diagnosis Date  . Anemia   . Anxiety   . Anxiety disorder 10/17/2013   Last Assessment & Plan:  Anxiety seems to be better controlled on today's visit. His son is doing well following valve surgery  . Atypical chest pain   . Cervical radiculitis 05/22/2014  . Chronic constipation   . Chronic prostatitis 08/31/2013  . DDD (degenerative disc disease),  lumbar 05/22/2014  . Diverticulosis   . Gastric polyps 01/10/2015   negative biopsy  . GERD (gastroesophageal reflux disease)   . History of chronic fatigue syndrome   . History of migraine headaches   . History of proteinuria syndrome   . Hypertension   . 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 Irritable bowel syndrome   . Lumbar radiculitis 05/22/2014  . 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 Paroxysmal atrial fibrillation (CMS/HHS-HCC)   . Prostatitis    recurrent  . Skin cancer    left leg  . Syncope and collapse 12/07/2011   Last Assessment & Plan:  No recent syncope Last Assessment & Plan:  No recent syncope   Past Surgical History:  Past Surgical History:  Procedure Laterality Date  . COLONOSCOPY  09/10/1993  . SIGMOIDOSCOPY FLEXIBLE  11/05/1999  . CATARACT EXTRACTION Bilateral 02/2010  . UPPER GASTROINTESTINAL ENDOSCOPY  11/16/2011   +Barrett's  . EGD  01/10/2015   Negative EGD biopsy/No Repeat/PYO  . COLONOSCOPY    . right hip hemiarthroplasty Right    Dr. Tobie    Allergies:  Allergies  Allergen Reactions  . Ciprofloxacin Diarrhea  . Lanoxin [Digoxin] Other (See Comments)  . Lopressor [Metoprolol Tartrate] Other (See Comments)  . Penicillin Other (See Comments)  . Sulfa (Sulfonamide Antibiotics) Rash  . Verapamil Other (See Comments)    Social History:  Social History   Socioeconomic History  . Marital status: Married  Tobacco Use  . Smoking status: Never  .  Smokeless tobacco: Never  Vaping Use  . Vaping status: Never Used  Substance and Sexual Activity  . Alcohol use: Never    Comment: socially  . Drug use: No  . Sexual activity: Defer  Social History Narrative   Exercise:  Exercises almost daily for 45 minutes.   Diet:  Red meat once a week.  Fast foods none.  Fried foods none.   Pneumovax:2010   Social Drivers of Corporate Investment Banker Strain: Low Risk  (08/10/2023)   Overall Financial  Resource Strain (CARDIA)   . Difficulty of Paying Living Expenses: Not hard at all  Food Insecurity: No Food Insecurity (10/02/2023)   Received from [facility]   Hunger Vital Sign   . Within the past 12 months, you worried that your food would run out before you got the money to buy more.: Never true   . Within the past 12 months, the food you bought just didn't last and you didn't have money to get more.: Never true  Transportation Needs: No Transportation Needs (10/02/2023)   Received from [facility] - Transportation   . Lack of Transportation (Medical): No   . Lack of Transportation (Non-Medical): No  Housing Stability: Low Risk  (03/31/2024)   Housing Stability Vital Sign   . Unable to Pay for Housing in the Last Year: No   . Number of Times Moved in the Last Year: 0   . Homeless in the Last Year: No   Family History:  Family History  Problem Relation Age of Onset  . Breast cancer Mother   . Appendicitis Mother   . Cholelithiasis Mother   . High blood pressure (Hypertension) Mother   . Arthritis Mother   . Arthritis Father   . Pneumonia Father   . Prostate cancer Father   . No Known Problems Maternal Grandmother   . No Known Problems Maternal Grandfather   . No Known Problems Paternal Grandmother   . No Known Problems Paternal Grandfather   . Diabetes type II Son    Current Medications:  Current Outpatient Medications  Medication Sig Dispense Refill  . acetaminophen  (TYLENOL ) 325 MG tablet Take 650 mg by mouth every 4 (four) hours as needed for Pain    . alendronate  (FOSAMAX ) 70 MG tablet TAKE 1 TABLET BY MOUTH EVERY 7 DAYS TAKE WITH A FULL GLASS OF WATER. DO NOT LIE DOWN FOR 30 MIN 12 tablet 0  . aluminum & magnesium  hydroxide-simethicone , DUH/DRH, (MYLANTA MAXIMUM) 400-400-40 mg/5 mL suspension Take 30 mLs by mouth    . bisacodyL  (DULCOLAX) 5 mg EC tablet Take 5 mg by mouth once daily as needed for Constipation    . calcium  carbonate-vitamin D3 (OYSCO 500/D)  500 mg-5 mcg (200 unit) tablet Take 1 tablet by mouth once daily    . cefpodoxime  (VANTIN ) 200 MG tablet Take 200 mg by mouth 2 (two) times daily    . cefUROXime  (CEFTIN ) 500 MG tablet Take 500 mg by mouth 2 (two) times daily with meals    . clindamycin (CLEOCIN) 150 MG capsule     . clotrimazole-betamethasone (LOTRISONE) 1-0.05 % cream APPLY TOPICALLY 2 (TWO) TIMES DAILY TO DRY CRACKED SKIN ON FEET 30 g 1  . co-enzyme Q-10, ubiquinone, 50 mg capsule Take 50 mg by mouth 2 (two) times daily.    . cranberry extract 500 mg Cap Take by mouth    . diltiazem  (CARDIZEM ) 30 MG tablet Take 1 tablet (30 mg) by mouth once  daily at bedtime    . finasteride  (PROSCAR ) 5 mg tablet Take 5 mg by mouth once daily    . flaxseed oil Oil Use 1 capsule once daily.    . flecainide  (TAMBOCOR ) 100 MG tablet Take 100 mg by mouth every 12 (twelve) hours as needed (as needed (atrial fibrillation or palpitations).)    . flecainide  (TAMBOCOR ) 50 MG tablet Take 50 mg by mouth 2 (two) times daily as needed    . fluticasone propion-salmeteroL (WIXELA INHUB) 250-50 mcg/dose diskus inhaler INHALE 1 PUFF INTO THE LUNGS 2 (TWO) TIMES DAILY AS NEEDED 60 each 2  . GEMTESA  75 mg Tab TAKE 1 TABLET BY MOUTH EVERY DAY 90 tablet 1  . ginger, Zingiber officinalis, 500 mg Cap Take 1 tablet by mouth once daily    . hydrocortisone (ANUSOL-HC) 25 mg suppository Place 1 suppository (25 mg total) rectally once daily as needed for Hemorrhoids 30 suppository 1  . Lactobacillus acidophilus Cap Take 1 capsule by mouth once daily.    . magnesium  oxide (MAG-OX) 400 mg (241.3 mg magnesium ) tablet Take 1 tablet (400 mg total) by mouth 2 (two) times daily 180 tablet 1  . multivitamin tablet Take 1 tablet by mouth once daily.    . MYRBETRIQ  25 mg ER Tablet Take 25 mg by mouth once daily    . NITROSTAT  0.4 mg SL tablet Place 1 tablet under the tongue every 5 (five) minutes as needed.  6  . omega 3-dha-epa-fish oil 1,000 mg (120 mg-180 mg) Cap Take by mouth     . pantoprazole  (PROTONIX ) 20 MG DR tablet TAKE 1 TABLET BY MOUTH EVERY DAY 90 tablet 3  . polyethylene glycol (MIRALAX ) powder Take 17 g by mouth once daily Mix in 4-8ounces of fluid prior to taking.    . saw palmetto  160 MG capsule Take 160 mg by mouth 3 (three) times daily    . tamsulosin  (FLOMAX ) 0.4 mg capsule Take 0.4 mg by mouth 2 (two) times daily Take 30 minutes after same meal each day.    . triamcinolone acetonide (ZILRETTA) 32 mg intra-articular suspension Inject 1 vial into each knee.    . urea (CEROVEL) 40 % topical cream Apply topically 2 (two) times daily To thickened toe nails 90 g 2  . vitamin B12-folic acid 0.5-1 mg Tab Take by mouth once daily.    . vitamin C-vitamin E Cap Take 1 capsule by mouth once daily.    SABRA warfarin (COUMADIN ) 5 MG tablet Take 1/2 (one-half) tablet by mouth once daily 45 tablet 1   No current facility-administered medications for this visit.    Physical Examination:   BP (!) 145/76   Pulse 64   Temp 36.7 C (98 F) (Oral)   Ht 172.7 cm (5' 8)   Wt 88 kg (194 lb)   SpO2 94%   BMI 29.50 kg/m  General/Constitutional: Well-nourished, well-developed. No apparent distress. Eyes: Pupils equal, round with synchronous movement. No injected sclera. Nasal: No congested turbinates. No sinus tenderness. Ear: TMs intact, canals patent. No erythema or exudate.  Oroph:No erythema or tonsillar swelling/exudate. No abscess. Neck: Supple, good ROM. No lymphadenopathy. Cardiovascular: RRR, no murmur Respiratory: + decreased breath sounds at the bases.  Normal respirations, no retractions  Vascular:+ mild-moderate edema worse on the L>R leg Skin: No abnormal rashes or lesions. Musculoskeletal: Uses wheelchair for ambulation in clinic  Tests Reviewed/Performed/Ordered:  EXAM: Chest Radiographs - 2 views (AP, lateral) performed 10/29/2024   CLINICAL INFORMATION: Bilateral leg swelling, SOB,  cough   COMPARISON: Chest xray - 2 views (AP, lateral)  performed 12/24/2022   FINDINGS:   My initial interpretation shows increased lung markings and vascular congestion bilaterally with blunting of costophrenic angles on AP and lateral views.  I personally reviewed and visualized the imaging studies if available.    Latest Reference Range & Units 10/29/24 13:31  Influenza A PCR Negative  Negative  Influenza B PCR Negative  Negative  SARS-CoV2 PCR Negative  Negative  RSV PCR Negative  Negative   Assessment:     ICD-10-CM  1. Encntr for obs for susp expsr to oth biolg agents ruled out  Z03.818  2. Shortness of breath  R06.02  3. Subacute cough  R05.2    Plan:   I have discussed the nature of Jaymian current subjective complaints, clinical examination, test results and have reviewed treatment options.  The plan is to do the following;  - You are being treated for concerns of acute fluid overload from possible heart failure or other metabolic abnormality.  I recommended evaluation by the emergency department due to inability to perform comprehensive laboratory evaluation and potential life-threatening decompensation to chronic conditions.  Virus testing was negative and he does not seem to have any other history or physical exam concerning for viral infection at this time. - His major findings were bilateral lower extremity edema with decreased lung sounds at the bases.  There was a slight decrease to his SpO2 as well.  Chest x-ray is not officially reported but on my review appears to show fluid overload with blunting of the costophrenic angles new from prior.  I discussed getting further evaluation which we cannot offer here so they agreed to go to the emergency department. - Follow up as indicated, or sooner should any new problems arise, if conditions worsen, or if they are otherwise concerned.   I spent a total of 41 minutes in both face-to-face and non face-to-face activities, excluding procedures performed, for this visit on the date of  this encounter which included: Preparation reviewing previous records, Patient Encounter, Counseling/Educating on possible underlying etiology of symptoms plus treatment/workup moving forward with shared decision making, Documentation, and Preparing orders and/or referrals.   Prentice Reges, DO [address] [place], [place] [address] Phone: [phone]  This note was generated in part with voice recognition software and I apologize for any typographical errors that were not detected and corrected.

## 2024-10-29 NOTE — ED Notes (Signed)
 Patient transported to X-ray

## 2024-10-30 ENCOUNTER — Inpatient Hospital Stay: Admit: 2024-10-30

## 2024-10-30 DIAGNOSIS — I509 Heart failure, unspecified: Secondary | ICD-10-CM | POA: Diagnosis not present

## 2024-10-30 DIAGNOSIS — I5023 Acute on chronic systolic (congestive) heart failure: Secondary | ICD-10-CM | POA: Diagnosis not present

## 2024-10-30 DIAGNOSIS — I480 Paroxysmal atrial fibrillation: Secondary | ICD-10-CM

## 2024-10-30 LAB — BASIC METABOLIC PANEL WITH GFR
Anion gap: 10 (ref 5–15)
BUN: 17 mg/dL (ref 8–23)
CO2: 24 mmol/L (ref 22–32)
Calcium: 8.2 mg/dL — ABNORMAL LOW (ref 8.9–10.3)
Chloride: 102 mmol/L (ref 98–111)
Creatinine, Ser: 0.87 mg/dL (ref 0.61–1.24)
GFR, Estimated: >60 mL/min (ref >60)
Glucose, Bld: 90 mg/dL (ref 70–99)
Potassium: 3.8 mmol/L (ref 3.5–5.1)
Sodium: 136 mmol/L (ref 135–145)

## 2024-10-30 LAB — CBC
HCT: 32.9 % — ABNORMAL LOW (ref 39.0–52.0)
Hemoglobin: 11.1 g/dL — ABNORMAL LOW (ref 13.0–17.0)
MCH: 27.9 pg (ref 26.0–34.0)
MCHC: 33.7 g/dL (ref 30.0–36.0)
MCV: 82.7 fL (ref 80.0–100.0)
Platelets: 276 K/uL (ref 150–400)
RBC: 3.98 MIL/uL — ABNORMAL LOW (ref 4.22–5.81)
RDW: 14.6 % (ref 11.5–15.5)
WBC: 6.7 K/uL (ref 4.0–10.5)
nRBC: 0 % (ref 0.0–0.2)

## 2024-10-30 LAB — CBG MONITORING, ED: Glucose-Capillary: 87 mg/dL (ref 70–99)

## 2024-10-30 LAB — IRON AND TIBC
Iron: 41 ug/dL — ABNORMAL LOW (ref 45–182)
Saturation Ratios: 13 % — ABNORMAL LOW (ref 17.9–39.5)
TIBC: 308 ug/dL (ref 250–450)
UIBC: 267 ug/dL

## 2024-10-30 LAB — PROTIME-INR
INR: 2.9 — ABNORMAL HIGH (ref 0.8–1.2)
Prothrombin Time: 31.4 s — ABNORMAL HIGH (ref 11.4–15.2)

## 2024-10-30 LAB — MAGNESIUM: Magnesium: 2.1 mg/dL (ref 1.7–2.4)

## 2024-10-30 LAB — FERRITIN: Ferritin: 30 ng/mL (ref 24–336)

## 2024-10-30 MED ORDER — WARFARIN - PHARMACIST DOSING INPATIENT
Freq: Every day | Status: DC
  Filled 2024-10-30: qty 1

## 2024-10-30 MED ORDER — WARFARIN SODIUM 2.5 MG PO TABS
2.5000 mg | ORAL_TABLET | Freq: Once | ORAL | Status: AC
  Administered 2024-10-30: 2.5 mg via ORAL
  Filled 2024-10-30: qty 1

## 2024-10-30 NOTE — Consult Note (Signed)
 Cardiology Consultation   Patient ID: Robert Burch MRN: [id number]; DOB: Aug 01, 1933  Admit date: 10/29/2024 Date of Consult: 10/30/2024  PCP:  Glover Lenis, MD   [facility] Providers Cardiologist:  Evalene Lunger, MD  Electrophysiologist:  Elspeth Sage, MD (Inactive)       Patient Profile: Robert Burch is a 89 y.o. male with a hx of paroxysmal atrial fibrillation, chronic chest pain, nonobstructive CAD (heart catheterization 2007), PVCs, chronic orthostasis, anxiety, GERD, IBS, hypertension, nonischemic cardiomyopathy, who is being seen 10/30/2024 for the evaluation of shortness of breath at the request of Dr. Dicky.  History of Present Illness: [name] was last seen in clinic 08/15/2024 by Dr. [name].  At that time he had complaints of shortness of breath when walking up an incline.  He also had questions related to his blood pressure and recent antibiotic therapy that he had been on.  Previous coronary CT completed with a calcium  score of 68 in 2018 and predominantly mildly in the circumflex vessel.  He had nonobstructive coronary artery disease noted on heart catheterization in 2007.  He had previously been following with EP for orthostasis.  He had a fall in October 2024 and welled up in SNF and became deconditioned.  His compliance was shortness of breath on hills and inclines.  On 10/29/2024 he presented to the [facility] emergency department via POV from [facility].  He was sent after an abnormal x-ray showing fluid accumulation and was sent to the emergency department for further workup for CHF.  He stated he had had about 1 week worth of swelling in both of his limbs.  Started to evaluate shortness of breath when he walks or lays down at night.  Continues to deny any chest discomfort.  Initial vital signs reveal blood pressure 149/69, pulse of 61, respirations of 19, temperature 97.7.  Pertinent labs revealed sodium of 133, calcium  8.5, hemoglobin 11.0, BNP 502.9,  INR of 2.7, high-sensitivity troponin negative.  Chest x-ray revealed cardiomegaly, aortic atherosclerosis, with pulmonary vascular congestion and small bilateral pleural effusions.  He was treated with furosemide 40 mg IVP magnesium  oxide 400 mg.  Cardiology was consulted to assist with ongoing management for concerns of acute exacerbation of congestive heart failure.  Past Medical History:  Diagnosis Date   Atypical chest pain    a. Nonobstructive, very minor irregs by cath 2007;  b. 06/2009 Ex MV: small area of apical lateral ischemia;  c. 06/2009 Cath: essentially normal;  d. 04/2014 Lexiscan  MV: EF 59%, no ischemia/infarct->low risk.   Cancer (HCC)    Chronic anticoagulation    a. Chronic Coumadin    Chronic anxiety    Diverticulosis    Dysrhythmia    atrial fib   GERD (gastroesophageal reflux disease)    Hypertension    IBS (irritable bowel syndrome)    Nonischemic cardiomyopathy (HCC)    a. EF 45% by echo 2010;  b. 05/2012 Echo: EF 55-65%, no rwma, Gr 1 DD, mild AI/MR, mildly dil LA.   Paralyzed vocal cords    Paroxysmal atrial fibrillation (HCC)    a. Treated with flecainide    Proteinuria    History of    Past Surgical History:  Procedure Laterality Date   CARDIAC CATHETERIZATION     cataract surgery Bilateral    HIP ARTHROPLASTY Right 03/17/2020   Procedure: ARTHROPLASTY BIPOLAR HIP (HEMIARTHROPLASTY);  Surgeon: [name], MD;  Location: [facility] ORS;  Service: Orthopedics;  Laterality: Right;   MASS BIOPSY  2013  Home Medications:  Prior to Admission medications   Medication Sig Start Date End Date Taking? Authorizing Provider  acetaminophen  (TYLENOL ) 325 MG tablet Take 650 mg by mouth every 6 (six) hours as needed.   Yes [provider]  acidophilus (RISAQUAD) CAPS capsule Take 1 capsule by mouth daily.   Yes [provider]  alendronate  (FOSAMAX ) 70 MG tablet Take 1 tablet (70 mg total) by mouth once a week. 11/16/23  Yes Caro Harlene POUR, NP   alum & mag hydroxide-simeth (MAALOX PLUS) 400-400-40 MG/5ML suspension Take 30 mLs by mouth every 4 (four) hours as needed for indigestion.   Yes [provider]  ascorbic acid (VITAMIN C) 500 MG tablet Take 500 mg by mouth daily.   Yes [provider]  Benzocaine-Menthol  (ORAJEL 2X TOOTHACHE & GUM) 20-0.26 % GEL Use as directed in the mouth or throat every 6 (six) hours as needed.   Yes [provider]  bisacodyl  (DULCOLAX) 5 MG EC tablet Take 1 tablet (5 mg total) by mouth daily as needed for moderate constipation. 10/05/23  Yes Wieting, Richard, MD  calcium -vitamin D (OSCAL WITH D) 500-5 MG-MCG tablet Take 1 tablet by mouth daily. 10/06/23  Yes Wieting, Richard, MD  Cholecalciferol  (VITAMIN D3) 400 UNITS CAPS Take 800 Units by mouth daily.    Yes [provider]  clotrimazole-betamethasone (LOTRISONE) cream Apply 1 Application topically as needed. 09/20/23  Yes [provider]  cyanocobalamin (VITAMIN B12) 1000 MCG tablet Take 1,000 mcg by mouth daily.   Yes [provider]  diltiazem  (CARDIZEM ) 30 MG tablet TAKE 1 TABLET (30 MG) BY MOUTH ONCE DAILY AT BEDTIME 01/04/24  Yes Fernande Elspeth BROCKS, MD  finasteride  (PROSCAR ) 5 MG tablet Take 1 tablet (5 mg total) by mouth daily. 01/06/24  Yes Francisca Redell BROCKS, MD  Flaxseed, Linseed, (FLAX SEED OIL) 1000 MG CAPS Take 1,000 mg by mouth daily.    Yes [provider]  flecainide  (TAMBOCOR ) 100 MG tablet Take 1 tablet (100 mg total) by mouth every 12 (twelve) hours as needed (atrial fibrillation or palpitations). 11/16/23  Yes Eubanks, Jessica K, NP  hydrocortisone (ANUSOL-HC) 25 MG suppository Place 25 mg rectally daily as needed for hemorrhoids or anal itching.   Yes [provider]  lidocaine  4 % Place 1 patch onto the skin. As needed   Yes [provider]  magnesium  oxide (MAG-OX) 400 MG tablet Take 1 tablet (400 mg total) by mouth 2 (two) times daily. 08/15/24  Yes [name], Timothy  J, MD  Multiple Vitamin (MULTIVITAMIN) tablet Take 1 tablet by mouth daily.     Yes [provider]  Omega-3 Fatty Acids (FISH OIL) 1000 MG CAPS Take 1,000 mg by mouth daily.    Yes [provider]  pantoprazole  (PROTONIX ) 20 MG tablet Take 20 mg by mouth daily. 07/31/24  Yes [provider]  polyethylene glycol (MIRALAX  / GLYCOLAX ) packet Take 17 g by mouth daily as needed for mild constipation or moderate constipation.   Yes [provider]  tamsulosin  (FLOMAX ) 0.4 MG CAPS capsule One capsule po at bedtime (start on 10/07/23) 08/15/24  Yes [name], Timothy J, MD  warfarin (COUMADIN ) 2.5 MG tablet Take 2.5 mg by mouth daily. 11/17/23  Yes [provider]  cefpodoxime  (VANTIN ) 200 MG tablet Take 1 tablet (200 mg total) by mouth 2 (two) times daily. Patient not taking: Reported on 10/29/2024 07/24/24   Helon Kirsch A, PA-C  Cranberry 500 MG CAPS Take 500 mg by mouth  daily. Patient not taking: Reported on 10/29/2024    [provider]  doxycycline  (VIBRAMYCIN ) 100 MG capsule Take 1 capsule (100 mg total) by mouth every 12 (twelve) hours. Patient not taking: Reported on 08/15/2024 07/12/24   Helon Kirsch A, PA-C  Triamcinolone Acetonide (ZILRETTA) 32 MG SRER intra-articular injection Inject 1 vial into each knee. Patient not taking: Reported on 10/29/2024 11/17/23   [provider]  Vibegron  (GEMTESA ) 75 MG TABS Take 1 tablet (75 mg total) by mouth daily. Patient not taking: Reported on 08/15/2024 01/06/24   Francisca Redell BROCKS, MD    Scheduled Meds:  finasteride   5 mg Oral Daily   furosemide  40 mg Intravenous Q12H   magnesium  oxide  400 mg Oral BID   pantoprazole   20 mg Oral Daily   sodium chloride  flush  3 mL Intravenous Q12H   tamsulosin   0.4 mg Oral QPC supper   Continuous Infusions:  PRN Meds: acetaminophen  **OR** acetaminophen , senna-docusate  Allergies:    Allergies  Allergen Reactions   Ciprofloxacin     Diarrhea and  stomach pains   Lanoxin [Digoxin]    Lopressor [Metoprolol Tartrate]    Penicillins     Other reaction(s): Unknown   Sulfonamide Derivatives     Rash    Verapamil    Sulfa Antibiotics Rash    Social History:   Social History   Socioeconomic History   Marital status: Married    Spouse name: Not on file   Number of children: 3   Years of education: Not on file   Highest education level: Not on file  Occupational History   Occupation: Art Gallery Manager from [employer]    Employer: RETIRED    Comment: Retired  Tobacco Use   Smoking status: Never   Smokeless tobacco: Never  Vaping Use   Vaping status: Never Used  Substance and Sexual Activity   Alcohol use: No    Alcohol/week: 2.0 standard drinks of alcohol    Types: 1 Glasses of wine, 1 Standard drinks or equivalent per week    Comment: occassionally   Drug use: No   Sexual activity: Not on file  Other Topics Concern   Not on file  Social History Narrative   Pt lives in [place] with his wife.  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[employer]   Social Drivers of Corporate Investment Banker Strain: Low Risk  (08/10/2023)   Received from [facility]   Overall Financial Resource Strain (CARDIA)    Difficulty of Paying Living Expenses: Not hard at all  Food Insecurity: No Food Insecurity (10/02/2023)   Hunger Vital Sign    Worried About Running Out of Food in the Last Year: Never true    Ran Out of Food in the Last Year: Never true  Transportation Needs: No Transportation Needs (10/02/2023)   PRAPARE - Administrator, Civil Service (Medical): No    Lack of Transportation (Non-Medical): No  Physical Activity: Not on file  Stress: Not on file  Social Connections: Not on file  Intimate Partner Violence: Not At Risk (10/02/2023)   Humiliation, Afraid, Rape, and Kick questionnaire    Fear of Current or Ex-Partner: No    Emotionally Abused: No    Physically Abused: No    Sexually Abused: No    Family History:     Family History  Problem Relation Age of Onset   Heart attack Neg Hx      ROS:  Please see the  history of present illness.   All other ROS reviewReview of Systems  Constitutional:  Positive for malaise/fatigue.  Respiratory:  Positive for shortness of breath.   Cardiovascular:  Positive for leg swelling.  Musculoskeletal:  Positive for falls.  Neurological:  Positive for weakness.   ed and negative.     Physical Exam/Data: Vitals:   10/30/24 0130 10/30/24 0330 10/30/24 0637 10/30/24 0736  BP: (!) 149/105 138/70 133/70 133/70  Pulse: (!) 58 (!) 58 (!) 52 (!) 52  Resp: 17 16 15 16   Temp:  97.9 F (36.6 C)  98 F (36.7 C)  TempSrc:  Oral    SpO2: 96% 96% 100% 100%  Weight:      Height:        Intake/Output Summary (Last 24 hours) at 10/30/2024 0812 Last data filed at 10/30/2024 0334 Gross per 24 hour  Intake --  Output 350 ml  Net -350 ml      10/29/2024    3:08 PM 08/15/2024   10:07 AM 08/08/2024   10:10 AM  Last 3 Weights  Weight (lbs) 195 lb 185 lb 190 lb  Weight (kg) 88.451 kg 83.915 kg 86.183 kg     Body mass index is 28.8 kg/m.  General:  Well nourished, well developed, in no acute distress HEENT: normal Neck: no JVD Vascular: No carotid bruits; Distal pulses 2+ bilaterally Cardiac:  normal S1, S2; RRR; no murmur  Lungs:  clear upper lobes with rales at the bases up to mid lobe bilaterally to auscultation bilaterally; respirations remain unlabored at rest on room air Abd: soft, nontender, no hepatomegaly  Ext: 1+ bilateral lower extremity edema Musculoskeletal:  No deformities, BUE and BLE strength normal and equal Skin: warm and dry  Neuro:  CNs 2-12 intact, no focal abnormalities noted Psych:  Normal affect   EKG:  The EKG was personally reviewed and demonstrates: Sinus rhythm with a rate of 62 with no ischemic changes found compared to prior studies Telemetry:  Telemetry was personally reviewed and demonstrates: Sinus bradycardia with occasional  PAC rates in the 50s  Relevant CV Studies: 2d echo 10/29/2020  1. Left ventricular ejection fraction, by estimation, is 55 to 60%. The  left ventricle has normal function. The left ventricle has no regional  wall motion abnormalities. There is mild left ventricular hypertrophy.  Left ventricular diastolic parameters  are consistent with Grade I diastolic dysfunction (impaired relaxation).   2. Right ventricular systolic function is normal. The right ventricular  size is normal. There is normal pulmonary artery systolic pressure.   3. Left atrial size was mildly dilated.   4. The mitral valve is abnormal. Mild mitral valve regurgitation. There  is mild late systolic prolapse of posterior leaflet of the mitral valve.   5. The aortic valve is tricuspid. Aortic valve regurgitation is trivial.  No aortic stenosis is present.   6. The inferior vena cava is normal in size with greater than 50%  respiratory variability, suggesting right atrial pressure of 3 mmHg.    Laboratory Data: High Sensitivity Troponin:   Recent Labs  Lab 10/29/24 1513  TROPONINIHS 10     Chemistry Recent Labs  Lab 10/29/24 1513 10/30/24 0335  NA 133* 136  K 4.5 3.8  CL 101 102  CO2 25 24  GLUCOSE 98 90  BUN 18 17  CREATININE 1.05 0.87  CALCIUM  8.5* 8.2*  MG 2.1 2.1  GFRNONAA >60 >60  ANIONGAP 7 10    No results for input(s): PROT,  ALBUMIN, AST, ALT, ALKPHOS, BILITOT in the last 168 hours. Lipids No results for input(s): CHOL, TRIG, HDL, LABVLDL, LDLCALC, CHOLHDL in the last 168 hours.  Hematology Recent Labs  Lab 10/29/24 1513 10/30/24 0335  WBC 7.0 6.7  RBC 3.93* 3.98*  HGB 11.0* 11.1*  HCT 33.1* 32.9*  MCV 84.2 82.7  MCH 28.0 27.9  MCHC 33.2 33.7  RDW 14.6 14.6  PLT 271 276   Thyroid No results for input(s): TSH, FREET4 in the last 168 hours.  BNP Recent Labs  Lab 10/29/24 1513  BNP 502.9*    DDimer  Recent Labs  Lab 10/29/24 1513  DDIMER <0.27     Radiology/Studies:  DG Chest 2 View Result Date: 10/29/2024 EXAM: 2 VIEW(S) XRAY OF THE CHEST 10/29/2024 04:35:00 PM COMPARISON: None available. CLINICAL HISTORY: dyspnea FINDINGS: LUNGS AND PLEURA: Pulmonary vascular congestion. Small bilateral pleural effusions. No pulmonary edema. No pneumothorax. HEART AND MEDIASTINUM: Cardiomegaly. Aortic atherosclerosis. BONES AND SOFT TISSUES: No acute osseous abnormality. IMPRESSION: 1. Cardiomegaly with pulmonary vascular congestion and small bilateral pleural effusions. Electronically signed by: Norman Gatlin MD 10/29/2024 04:44 PM EST RP Workstation: [id number]     Assessment and Plan: Acute heart failure exacerbation -Presented with shortness of breath and edema for the last 2 weeks -Chest x-ray completed to [facility] revealed bilateral pleural effusions patient was sent to the ED for further evaluation -Patient appears to be volume up -BNP 502.9 -Continue on furosemide 40 mg IV twice daily - -650 milliliters output since arrival to the emergency department documented -Echocardiogram ordered and pending with further recommendations to follow -Heart failure education -I's and O's, daily weights  Paroxysmal atrial fibrillation -Longstanding history of PAF -Currently maintaining sinus rhythm on EKG and telemetry -INR 2.9 -Continue on warfarin therapy for CHA2DS2-VASc score of at least 4 for stroke prophylaxis. - Hemoglobin remained stable patient denies any issues with bleeding with no blood noted in urine or stool -Previously had been on diltiazem  30 mg at bedtime, currently on hold with rate controlled in the 60s -Continue with telemetry monitoring  Hypertension -Blood pressure 133/70 - Continued on furosemide - Vital signs per unit protocol  Hyperlipidemia -LDL 92 -Not currently on statin therapy  BPH -Continued on PTA medications - Ongoing management per IM   Risk Assessment/Risk Scores:       New [place]  Heart Association  (NYHA) Functional Class NYHA Class II  CHA2DS2-VASc Score = 4   This indicates a 4.8% annual risk of stroke. The patient's score is based upon: CHF History: 1 HTN History: 1 Diabetes History: 0 Stroke History: 0 Vascular Disease History: 0 Age Score: 2 Gender Score: 0        For questions or updates, please contact [facility] Please consult [email or web address] for contact info under      Signed, Luman Holway, NP  10/30/2024 8:12 AM

## 2024-10-30 NOTE — Progress Notes (Signed)
 PHARMACY - ANTICOAGULATION CONSULT NOTE  Pharmacy Consult for warfarin Indication: atrial fibrillation  Allergies  Allergen Reactions   Ciprofloxacin     Diarrhea and stomach pains   Lanoxin [Digoxin]    Lopressor [Metoprolol Tartrate]    Penicillins     Other reaction(s): Unknown   Sulfonamide Derivatives     Rash    Verapamil    Sulfa Antibiotics Rash    Patient Measurements: Height: 5' 9 (175.3 cm) Weight: 88.5 kg (195 lb) IBW/kg (Calculated) : 70.7 HEPARIN DW (KG): 88.4  Vital Signs: Temp: 98 F (36.7 C) (11/10 0736) Temp Source: Oral (11/10 0330) BP: 151/82 (11/10 0845) Pulse Rate: 78 (11/10 0845)  Labs: Recent Labs    10/29/24 1513 10/30/24 0335  HGB 11.0* 11.1*  HCT 33.1* 32.9*  PLT 271 276  LABPROT 29.9* 31.4*  INR 2.7* 2.9*  CREATININE 1.05 0.87  TROPONINIHS 10  --     Estimated Creatinine Clearance: 64.6 mL/min (by C-G formula based on SCr of 0.87 mg/dL).   Medical History: Past Medical History:  Diagnosis Date   Atypical chest pain    a. Nonobstructive, very minor irregs by cath 2007;  b. 06/2009 Ex MV: small area of apical lateral ischemia;  c. 06/2009 Cath: essentially normal;  d. 04/2014 Lexiscan  MV: EF 59%, no ischemia/infarct->low risk.   Cancer (HCC)    Chronic anticoagulation    a. Chronic Coumadin    Chronic anxiety    Diverticulosis    Dysrhythmia    atrial fib   GERD (gastroesophageal reflux disease)    Hypertension    IBS (irritable bowel syndrome)    Nonischemic cardiomyopathy (HCC)    a. EF 45% by echo 2010;  b. 05/2012 Echo: EF 55-65%, no rwma, Gr 1 DD, mild AI/MR, mildly dil LA.   Paralyzed vocal cords    Paroxysmal atrial fibrillation (HCC)    a. Treated with flecainide    Proteinuria    History of    Medications:  Current home Warfarin dose is 2.5 mg po daily, last reported dose PTA 10/29/24.   Assessment: 89 yo male  with medical history of HTN, HLD, paroxysmal Afib, and BPH presenting to the ED from urgent care for  shortness of breath.  Pharmacy consulted to manage warfarin dosing.   Goal of Therapy:  INR 2-3 Monitor platelets by anticoagulation protocol: Yes    Plan:  INR is therapeutic on admission Give warfarin 2.5 mg PO x1 today INR in am CBC at least every 3 days.   Thank you for involving pharmacy in this patient's care.   Adriana JONETTA Bolster 10/30/2024,9:55 AM

## 2024-10-30 NOTE — ED Notes (Signed)
Pt given supper tray.

## 2024-10-30 NOTE — Progress Notes (Signed)
 PROGRESS NOTE Robert Burch    DOB: 23-Nov-1933, 89 y.o.  FMW:[id number]    Code Status: Full Code   DOA: 10/29/2024   LOS: 1  Brief hospital course  Robert Burch is a 89 y.o. male with a PMH significant for HTN, HLD, Paroxysmal Afib, and BPH presenting to the ED from urgent care for shortness of breath with exertion.    On arrival to the ED patient was noted to be stable. CBC shows normal leukocyte count and mild anemia, BMP with mild hyponatremia, BNP elevated at 502 and CXR shows signs of vascular congestion.  They were initially treated with IV lasix.   Patient was admitted to medicine service for further workup and management of CHF exacerbation as outlined in detail below.  10/30/24 -feeling well. Weaned to room air with ambulation. Echo ordered but not completed yet. Still with LE edema. Continuing on IV lasix.  Assessment & Plan  Principal Problem:   CHF exacerbation (HCC) Active Problems:   Paroxysmal atrial fibrillation (HCC)   Essential hypertension   GERD (gastroesophageal reflux disease)   BPH (benign prostatic hyperplasia)   Hyperlipidemia  Acute Heart Failure Exacerbation- improving. Now on room air with ambulation.  Pt with hx of CHF and signs of volume overload on exam and imaging suggestive of CHF exacerbation. Holding home CCB - IV Furosemide 40 mg BID  - Trend BMP, follow mag (goal K>4 and Mag>2) - Strict I&Os - Daily Weights - Fluid restriction to 2 L, limit salt intake. Educate on importance of both.  - Keep O2 sat >88 -awaiting echo which was ordered yesterday and causing a delay in his care and dc. - ambulate with pulse ox   Paroxysmal Afib: in NSR. CTM. Continue Warfarin. Likely on warfarin due to cost. Holding CCB   HTN: Holding home meds. Normal range BP.    BPH: continue home meds   GERD: continue home meds  Body mass index is 28.8 kg/m.  VTE ppx: ambulatory   Diet:     Diet   Diet Heart Room service appropriate? Yes;  Fluid consistency: Thin   Consultants: None   Subjective 10/30/24    Pt reports feeling well. LE swelling improving. No chest pain. Minimal SOB with short ambulation in the ED. Has not walked to distances yet that were causing him SOB at home while here to assess improvement.   Objective  Blood pressure 133/70, pulse (!) 52, temperature 98 F (36.7 C), resp. rate 16, height 5' 9 (1.753 m), weight 88.5 kg, SpO2 100%.  Intake/Output Summary (Last 24 hours) at 10/30/2024 0816 Last data filed at 10/30/2024 0334 Gross per 24 hour  Intake --  Output 350 ml  Net -350 ml   Filed Weights   10/29/24 1508  Weight: 88.5 kg    Physical Exam:  General: awake, alert, NAD HEENT: atraumatic, clear conjunctiva, anicteric sclera, MMM, hard of hearing Respiratory: normal respiratory effort. Cardiovascular: extremities well perfused, quick capillary refill, normal S1/S2, RRR, no JVD, murmurs Nervous: A&O x3. no gross focal neurologic deficits, normal speech Extremities: 2+ edema to thighs Skin: dry, intact, normal temperature, normal color. No rashes, lesions or ulcers on exposed skin Psychiatry: normal mood, congruent affect  Labs   I have personally reviewed the following labs and imaging studies CBC    Component Value Date/Time   WBC 6.7 10/30/2024 0335   RBC 3.98 (L) 10/30/2024 0335   HGB 11.1 (L) 10/30/2024 0335   HGB 12.6 (L) 12/20/2023 1518  HCT 32.9 (L) 10/30/2024 0335   HCT 39.1 12/20/2023 1518   PLT 276 10/30/2024 0335   PLT 268 12/20/2023 1518   MCV 82.7 10/30/2024 0335   MCV 88 12/20/2023 1518   MCH 27.9 10/30/2024 0335   MCHC 33.7 10/30/2024 0335   RDW 14.6 10/30/2024 0335   RDW 13.5 12/20/2023 1518   LYMPHSABS 1.3 10/02/2023 1156   MONOABS 0.9 10/02/2023 1156   EOSABS 0.0 10/02/2023 1156   BASOSABS 0.1 10/02/2023 1156      Latest Ref Rng & Units 10/30/2024    3:35 AM 10/29/2024    3:13 PM 10/05/2023    6:06 AM  BMP  Glucose 70 - 99 mg/dL 90  98  879   BUN 8  - 23 mg/dL 17  18  18    Creatinine 0.61 - 1.24 mg/dL 9.12  8.94  9.23   Sodium 135 - 145 mmol/L 136  133  132   Potassium 3.5 - 5.1 mmol/L 3.8  4.5  4.6   Chloride 98 - 111 mmol/L 102  101  101   CO2 22 - 32 mmol/L 24  25  26    Calcium  8.9 - 10.3 mg/dL 8.2  8.5  7.8     DG Chest 2 View Result Date: 10/29/2024 EXAM: 2 VIEW(S) XRAY OF THE CHEST 10/29/2024 04:35:00 PM COMPARISON: None available. CLINICAL HISTORY: dyspnea FINDINGS: LUNGS AND PLEURA: Pulmonary vascular congestion. Small bilateral pleural effusions. No pulmonary edema. No pneumothorax. HEART AND MEDIASTINUM: Cardiomegaly. Aortic atherosclerosis. BONES AND SOFT TISSUES: No acute osseous abnormality. IMPRESSION: 1. Cardiomegaly with pulmonary vascular congestion and small bilateral pleural effusions. Electronically signed by: Norman Gatlin MD 10/29/2024 04:44 PM EST RP Workstation: [id number]    Disposition Plan & Communication  Patient status: Inpatient  Admitted From: Home Planned disposition location: Home Anticipated discharge date: 11/11 pending clinical improvement and echo  Family Communication: wife at bedside    Author: Marien LITTIE Piety, DO [facility] 10/30/2024, 8:16 AM   Available by Epic secure chat 7AM-7PM. If 7PM-7AM, please contact night-coverage.  [facility] contact information found on [email or web address].

## 2024-10-30 NOTE — ED Notes (Addendum)
 Pt ambulated with walker down the hall from one end to the other. Pt O2 stayed at 95 % RA. Pt stated little sob. Pt ambulated well

## 2024-10-30 NOTE — ED Notes (Signed)
 Pt linens changed and cleaned.

## 2024-10-30 NOTE — ED Notes (Signed)
 Pt has breakfast tray set up and eating at this time.

## 2024-10-30 NOTE — Progress Notes (Signed)
 Heart Failure Nurse Navigator Progress Note  PCP: Glover Lenis, MD PCP-Cardiologist: Dr. Timothy [name]                                                                                                         Admission Diagnosis: Congestive Heart Failure, unspecified HF chronicity, unspecified heart failure type [facility]) Admitted from: Walk In Clinic   Presentation:   Robert Burch is a 89 y.o. male who presented to the ED for shortness of breath when he walks or lays down at night.  He also had bilateral leg swelling.  No chest pain. He had these symptoms for the last 2-3 days and they seemed to be worsening.  He has a history of atypical Chest pain, Cancer, Chronic Anticoagulation, Chronic anxiety, Diverticulosis, Dysrhythmia, GERD, Hypertension, IBS, Nonischemic cardiomyopathy, Paralyzed vocal cords, Paroxysmal Atrial Fibrillation and Proteinuria.   BP 149/69, Pulse 51.  BNP 502. Chest xray: small bilateral pleural effusions vascular congestion.  ECHO/ LVEF: 10/30/24 Results pending  Clinical Course:  Past Medical History:  Diagnosis Date   Atypical chest pain    a. Nonobstructive, very minor irregs by cath 2007;  b. 06/2009 Ex MV: small area of apical lateral ischemia;  c. 06/2009 Cath: essentially normal;  d. 04/2014 Lexiscan  MV: EF 59%, no ischemia/infarct->low risk.   Cancer (HCC)    Chronic anticoagulation    a. Chronic Coumadin    Chronic anxiety    Diverticulosis    Dysrhythmia    atrial fib   GERD (gastroesophageal reflux disease)    Hypertension    IBS (irritable bowel syndrome)    Nonischemic cardiomyopathy (HCC)    a. EF 45% by echo 2010;  b. 05/2012 Echo: EF 55-65%, no rwma, Gr 1 DD, mild AI/MR, mildly dil LA.   Paralyzed vocal cords    Paroxysmal atrial fibrillation (HCC)    a. Treated with flecainide    Proteinuria    History of     Social History   Socioeconomic History   Marital status: Married    Spouse name: Not on file   Number of children: 3   Years  of education: Not on file   Highest education level: Not on file  Occupational History   Occupation: Art Gallery Manager from [employer]    Employer: RETIRED    Comment: Retired  Tobacco Use   Smoking status: Never   Smokeless tobacco: Never  Vaping Use   Vaping status: Never Used  Substance and Sexual Activity   Alcohol use: No    Alcohol/week: 2.0 standard drinks of alcohol    Types: 1 Glasses of wine, 1 Standard drinks or equivalent per week    Comment: occassionally   Drug use: No   Sexual activity: Not on file  Other Topics Concern   Not on file  Social History Narrative   Pt lives in [place] with his wife.  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[employer]   Social Drivers of Corporate Investment Banker Strain: Low Risk  (08/10/2023)   Received from [facility]   Overall Financial  Resource Strain (CARDIA)    Difficulty of Paying Living Expenses: Not hard at all  Food Insecurity: No Food Insecurity (10/30/2024)   Hunger Vital Sign    Worried About Running Out of Food in the Last Year: Never true    Ran Out of Food in the Last Year: Never true  Transportation Needs: No Transportation Needs (10/30/2024)   PRAPARE - Administrator, Civil Service (Medical): No    Lack of Transportation (Non-Medical): No  Physical Activity: Not on file  Stress: Not on file  Social Connections: Unknown (10/30/2024)   Social Connection and Isolation Panel    Frequency of Communication with Friends and Family: Patient declined    Frequency of Social Gatherings with Friends and Family: Patient declined    Attends Religious Services: Patient declined    Database Administrator or Organizations: Patient declined    Attends Banker Meetings: Patient declined    Marital Status: Married   Water Engineer and Provision:  Detailed education and instructions provided on heart failure disease management including the following:Wife was at the bedside while providing CHF  education and scheduling his CHF TOC appointment.    Signs and symptoms of Heart Failure When to call the physician Importance of daily weights Low sodium diet Fluid restriction Medication management Anticipated future follow-up appointments  Patient education given on each of the above topics.  Patient acknowledges understanding via teach back method and acceptance of all instructions.  Education Materials:  Living Better With Heart Failure Booklet, HF zone tool, & Daily Weight Tracker Tool.  Patient has scale at home: Yes but has not been doing daily weights at home Patient has pill box at home: Yes.  Wife prepares weekly medication box. She has a current list of medications.    High Risk Criteria for Readmission and/or Poor Patient Outcomes: Heart failure hospital admissions (last 6 months): 1  No Show rate: 2% Difficult social situation: No Demonstrates medication adherence: Yes.  His wife prepares his pill box for him.  He once in awhile might miss a late night medication if she falls asleep before him.  Otherwise she makes sure he takes all of his medications. Primary Language: English Literacy level: Reading, Writing, Comprehension  Barriers of Care:   Daily Weights-has never done daily weights at home before.  Only does them at doctor appointments. Diet & Fluid Restrictions-his wife does the cooking and tries to watch his salt intake.  He does admit to eating salty snacks especially pretzels. His wife feels like he drinks less than 64 oz of fluid a day.  Considerations/Referrals:  Referral made to Heart Failure Pharmacist Stewardship: Yes Referral made to Heart Failure CSW/NCM TOC: No Referral made to Heart & Vascular TOC clinic: Yes. AHF Clinic [facility] 11/10/24 @ 1:30 AM  Items for Follow-up on DC/TOC: Daily Weights Diet & Fluid Restrictions Continued Heart Failure Education  Charmaine Pines, RN, BSN [facility] Heart Failure Navigator Secure Chat Only

## 2024-10-30 NOTE — ED Notes (Signed)
 Pt given lunch tray.

## 2024-10-31 ENCOUNTER — Inpatient Hospital Stay
Admit: 2024-10-31 | Discharge: 2024-10-31 | Disposition: A | Attending: Student in an Organized Health Care Education/Training Program

## 2024-10-31 ENCOUNTER — Telehealth (HOSPITAL_COMMUNITY): Payer: Self-pay | Admitting: Pharmacy Technician

## 2024-10-31 ENCOUNTER — Other Ambulatory Visit (HOSPITAL_COMMUNITY): Payer: Self-pay

## 2024-10-31 DIAGNOSIS — I5031 Acute diastolic (congestive) heart failure: Secondary | ICD-10-CM

## 2024-10-31 DIAGNOSIS — I480 Paroxysmal atrial fibrillation: Secondary | ICD-10-CM | POA: Diagnosis not present

## 2024-10-31 DIAGNOSIS — I509 Heart failure, unspecified: Secondary | ICD-10-CM

## 2024-10-31 LAB — BASIC METABOLIC PANEL WITH GFR
Anion gap: 9 (ref 5–15)
BUN: 20 mg/dL (ref 8–23)
CO2: 28 mmol/L (ref 22–32)
Calcium: 8.6 mg/dL — ABNORMAL LOW (ref 8.9–10.3)
Chloride: 99 mmol/L (ref 98–111)
Creatinine, Ser: 1.04 mg/dL (ref 0.61–1.24)
GFR, Estimated: >60 mL/min (ref >60)
Glucose, Bld: 99 mg/dL (ref 70–99)
Potassium: 4.1 mmol/L (ref 3.5–5.1)
Sodium: 136 mmol/L (ref 135–145)

## 2024-10-31 LAB — ECHOCARDIOGRAM COMPLETE
AR max vel: 1.98 cm2
AV Area VTI: 2.25 cm2
AV Area mean vel: 1.84 cm2
AV Mean grad: 3 mmHg
AV Peak grad: 5.2 mmHg
Ao pk vel: 1.14 m/s
Area-P 1/2: 4.77 cm2
Height: 69 in
MV VTI: 1.84 cm2
S' Lateral: 2.8 cm
Weight: 3149.93 [oz_av]

## 2024-10-31 LAB — PROTIME-INR
INR: 2.5 — ABNORMAL HIGH (ref 0.8–1.2)
Prothrombin Time: 28.2 s — ABNORMAL HIGH (ref 11.4–15.2)

## 2024-10-31 LAB — GLUCOSE, CAPILLARY: Glucose-Capillary: 112 mg/dL — ABNORMAL HIGH (ref 70–99)

## 2024-10-31 MED ORDER — FUROSEMIDE 40 MG PO TABS: 40.0000 mg | ORAL_TABLET | Freq: Every day | ORAL | 0 refills | Status: DC

## 2024-10-31 MED ORDER — WARFARIN SODIUM 2.5 MG PO TABS
2.5000 mg | ORAL_TABLET | Freq: Once | ORAL | Status: AC
  Administered 2024-10-31: 2.5 mg via ORAL
  Filled 2024-10-31: qty 1

## 2024-10-31 MED ORDER — LIDOCAINE 5 % EX PTCH
1.0000 | MEDICATED_PATCH | CUTANEOUS | Status: DC
  Administered 2024-10-31: 1 via TRANSDERMAL
  Filled 2024-10-31: qty 1

## 2024-10-31 MED ORDER — FUROSEMIDE 40 MG PO TABS: 40.0000 mg | ORAL_TABLET | Freq: Every day | ORAL | Status: DC

## 2024-10-31 NOTE — TOC Transition Note (Signed)
 Transition of Care [facility]) - Discharge Note   Patient Details  Name: Robert Burch MRN: [id number] Date of Birth: 11/07/1933  Transition of Care [facility]) CM/SW Contact:  Lauraine JAYSON Carpen, LCSW Phone Number: 10/31/2024, 3:56 PM   Clinical Narrative:  Patient has orders to discharge home today. CSW met with patient. Wife at bedside. CSW introduced role and explained that PT recommendations would be discussed. Patient and wife are agreeable to home health. First preference is [facility] because patient has worked with them in the past. They have accepted for PT. No further concerns. CSW signing off.   Final next level of care: Home w Home Health Services Barriers to Discharge: No Barriers Identified   Patient Goals and CMS Choice     Choice offered to / list presented to : Patient, Spouse      Discharge Placement                Patient to be transferred to facility by: Wife Name of family member notified: Krishang Reading Patient and family notified of of transfer: 10/31/24  Discharge Plan and Services Additional resources added to the After Visit Summary for                            [facility] Arranged: PT HH Agency: [facility] Date HH Agency Contacted: 10/31/24   Representative spoke with at [facility] Agency: Shaun  Social Drivers of Health (SDOH) Interventions SDOH Screenings   Food Insecurity: No Food Insecurity (10/30/2024)  Housing: Low Risk  (10/30/2024)  Transportation Needs: No Transportation Needs (10/30/2024)  Utilities: Not At Risk (10/30/2024)  Financial Resource Strain: Low Risk  (08/10/2023)   Received from [facility]  Social Connections: Unknown (10/30/2024)  Tobacco Use: Low Risk  (10/29/2024)     Readmission Risk Interventions     No data to display

## 2024-10-31 NOTE — Progress Notes (Incomplete)
 PROGRESS NOTE Robert Burch    DOB: 12-Nov-1933, 89 y.o.  FMW:[id number]    Code Status: Full Code   DOA: 10/29/2024   LOS: 2  Brief hospital course  Robert Burch is a 89 y.o. male with a PMH significant for HTN, HLD, Paroxysmal Afib, and BPH presenting to the ED from urgent care for shortness of breath with exertion.    On arrival to the ED patient was noted to be stable. CBC shows normal leukocyte count and mild anemia, BMP with mild hyponatremia, BNP elevated at 502 and CXR shows signs of vascular congestion.  They were initially treated with IV lasix.   Patient was admitted to medicine service for further workup and management of CHF exacerbation as outlined in detail below.  10/31/24 -feeling well. Weaned to room air with ambulation. Echo ordered but not completed yet. Still with LE edema. Continuing on IV lasix.  Assessment & Plan  Principal Problem:   CHF exacerbation (HCC) Active Problems:   Paroxysmal atrial fibrillation (HCC)   Essential hypertension   GERD (gastroesophageal reflux disease)   BPH (benign prostatic hyperplasia)   Hyperlipidemia  Acute Heart Failure Exacerbation- improving. Now on room air with ambulation.  Pt with hx of CHF and signs of volume overload on exam and imaging suggestive of CHF exacerbation. Holding home CCB - IV Furosemide 40 mg BID  - Trend BMP, follow mag (goal K>4 and Mag>2) - Strict I&Os - Daily Weights - Fluid restriction to 2 L, limit salt intake. Educate on importance of both.  - Keep O2 sat >88 -awaiting echo which was ordered yesterday and causing a delay in his care and dc. - ambulate with pulse ox   Paroxysmal Afib: in NSR. CTM. Continue Warfarin. Likely on warfarin due to cost. Holding CCB   HTN: Holding home meds. Normal range BP.    BPH: continue home meds   GERD: continue home meds  Body mass index is 29.07 kg/m.  VTE ppx: ambulatory   Diet:     Diet   Diet Heart Room service appropriate? Yes;  Fluid consistency: Thin   Consultants: None   Subjective 10/31/24    Pt reports feeling well. LE swelling improving. No chest pain. Minimal SOB with short ambulation in the ED. Has not walked to distances yet that were causing him SOB at home while here to assess improvement.   Objective  Blood pressure 133/70, pulse (!) 52, temperature 98 F (36.7 C), resp. rate 16, height 5' 9 (1.753 m), weight 88.5 kg, SpO2 100%.  Intake/Output Summary (Last 24 hours) at 10/31/2024 0753 Last data filed at 10/31/2024 0340 Gross per 24 hour  Intake --  Output 2175 ml  Net -2175 ml   Filed Weights   10/29/24 1508 10/31/24 0423  Weight: 88.5 kg 89.3 kg    Physical Exam:  General: awake, alert, NAD HEENT: atraumatic, clear conjunctiva, anicteric sclera, MMM, hard of hearing Respiratory: normal respiratory effort. Cardiovascular: extremities well perfused, quick capillary refill, normal S1/S2, RRR, no JVD, murmurs Nervous: A&O x3. no gross focal neurologic deficits, normal speech Extremities: 2+ edema to thighs Skin: dry, intact, normal temperature, normal color. No rashes, lesions or ulcers on exposed skin Psychiatry: normal mood, congruent affect  Labs   I have personally reviewed the following labs and imaging studies CBC    Component Value Date/Time   WBC 6.7 10/30/2024 0335   RBC 3.98 (L) 10/30/2024 0335   HGB 11.1 (L) 10/30/2024 0335   HGB 12.6 (L)  12/20/2023 1518   HCT 32.9 (L) 10/30/2024 0335   HCT 39.1 12/20/2023 1518   PLT 276 10/30/2024 0335   PLT 268 12/20/2023 1518   MCV 82.7 10/30/2024 0335   MCV 88 12/20/2023 1518   MCH 27.9 10/30/2024 0335   MCHC 33.7 10/30/2024 0335   RDW 14.6 10/30/2024 0335   RDW 13.5 12/20/2023 1518   LYMPHSABS 1.3 10/02/2023 1156   MONOABS 0.9 10/02/2023 1156   EOSABS 0.0 10/02/2023 1156   BASOSABS 0.1 10/02/2023 1156      Latest Ref Rng & Units 10/30/2024    3:35 AM 10/29/2024    3:13 PM 10/05/2023    6:06 AM  BMP  Glucose 70 - 99  mg/dL 90  98  879   BUN 8 - 23 mg/dL 17  18  18    Creatinine 0.61 - 1.24 mg/dL 9.12  8.94  9.23   Sodium 135 - 145 mmol/L 136  133  132   Potassium 3.5 - 5.1 mmol/L 3.8  4.5  4.6   Chloride 98 - 111 mmol/L 102  101  101   CO2 22 - 32 mmol/L 24  25  26    Calcium  8.9 - 10.3 mg/dL 8.2  8.5  7.8     DG Chest 2 View Result Date: 10/29/2024 EXAM: 2 VIEW(S) XRAY OF THE CHEST 10/29/2024 04:35:00 PM COMPARISON: None available. CLINICAL HISTORY: dyspnea FINDINGS: LUNGS AND PLEURA: Pulmonary vascular congestion. Small bilateral pleural effusions. No pulmonary edema. No pneumothorax. HEART AND MEDIASTINUM: Cardiomegaly. Aortic atherosclerosis. BONES AND SOFT TISSUES: No acute osseous abnormality. IMPRESSION: 1. Cardiomegaly with pulmonary vascular congestion and small bilateral pleural effusions. Electronically signed by: Norman Gatlin MD 10/29/2024 04:44 PM EST RP Workstation: [id number]    Disposition Plan & Communication  Patient status: Inpatient  Admitted From: Home Planned disposition location: Home Anticipated discharge date: 11/11 pending clinical improvement and echo  Family Communication: wife at bedside    Author: Marien LITTIE Piety, DO [facility] 10/31/2024, 7:53 AM   Available by Epic secure chat 7AM-7PM. If 7PM-7AM, please contact night-coverage.  TR[facility] contact information found on [email or web address].

## 2024-10-31 NOTE — Telephone Encounter (Signed)
 Patient Product/process Development Scientist completed.    The patient is insured through [facility]. Patient has Medicare and is not eligible for a copay card, but may be able to apply for patient assistance or Medicare RX Payment Plan (Patient Must reach out to their plan, if eligible for payment plan), if available.    Ran test claim for Farxiga 10 mg and the current 30 day co-pay is $83.00.  Ran test claim for Jardiance 10 mg and the current 30 day co-pay is $83.00.  This test claim was processed through [facility]- copay amounts may vary at other pharmacies due to pharmacy/plan contracts, or as the patient moves through the different stages of their insurance plan.     Reyes Sharps, CPHT Pharmacy Technician Patient Advocate Specialist Lead [facility] Patient Advocate Team Direct Number: [phone]  Fax: [phone]

## 2024-10-31 NOTE — Progress Notes (Signed)
*  PRELIMINARY RESULTS* Echocardiogram 2D Echocardiogram has been performed.  Robert Burch 10/31/2024, 7:47 AM

## 2024-10-31 NOTE — Progress Notes (Signed)
 Rounding Note   Patient Name: Robert Burch Date of Encounter: 10/31/2024  [facility] Cardiologist: Evalene Lunger, MD   Subjective Patient seen on rounds.  Denies any chest pain or shortness of breath.  Peripheral edema has improved.  Wife remains at the bedside.  -1.9 L output in the last 24 hours.  Scheduled Meds:  finasteride   5 mg Oral Daily   furosemide  40 mg Intravenous Q12H   lidocaine   1 patch Transdermal Q24H   magnesium  oxide  400 mg Oral BID   pantoprazole   20 mg Oral Daily   sodium chloride  flush  3 mL Intravenous Q12H   tamsulosin   0.4 mg Oral QPC supper   Warfarin - Pharmacist Dosing Inpatient   Does not apply q1600   Continuous Infusions:  PRN Meds: acetaminophen  **OR** acetaminophen    Vital Signs  Vitals:   10/30/24 2313 10/31/24 0340 10/31/24 0423 10/31/24 0749  BP: 129/65 (!) 150/88  139/75  Pulse: (!) 55 (!) 57  (!) 56  Resp: 18 18    Temp: 97.8 F (36.6 C) 98 F (36.7 C)  (!) 97.5 F (36.4 C)  TempSrc:      SpO2: 95% 96%  93%  Weight:   89.3 kg   Height:        Intake/Output Summary (Last 24 hours) at 10/31/2024 0911 Last data filed at 10/31/2024 0340 Gross per 24 hour  Intake --  Output 1975 ml  Net -1975 ml      10/31/2024    4:23 AM 10/29/2024    3:08 PM 08/15/2024   10:07 AM  Last 3 Weights  Weight (lbs) 196 lb 13.9 oz 195 lb 185 lb  Weight (kg) 89.3 kg 88.451 kg 83.915 kg      Telemetry Sinus bradycardia to sinus rhythm- Personally Reviewed  ECG  No new tracings- Personally Reviewed  Physical Exam  GEN: No acute distress.   Neck: No JVD Cardiac: RRR, no murmurs, rubs, or gallops.  Respiratory: Clear with slightly diminished bases to auscultation bilaterally.  Respirations are unlabored at rest on room air GI: Soft, nontender, non-distended  MS: No edema; No deformity. Neuro:  Nonfocal  Psych: Normal affect   Labs High Sensitivity Troponin:   Recent Labs  Lab 10/29/24 1513  TROPONINIHS 10      Chemistry Recent Labs  Lab 10/29/24 1513 10/30/24 0335  NA 133* 136  K 4.5 3.8  CL 101 102  CO2 25 24  GLUCOSE 98 90  BUN 18 17  CREATININE 1.05 0.87  CALCIUM  8.5* 8.2*  MG 2.1 2.1  GFRNONAA >60 >60  ANIONGAP 7 10    Lipids No results for input(s): CHOL, TRIG, HDL, LABVLDL, LDLCALC, CHOLHDL in the last 168 hours.  Hematology Recent Labs  Lab 10/29/24 1513 10/30/24 0335  WBC 7.0 6.7  RBC 3.93* 3.98*  HGB 11.0* 11.1*  HCT 33.1* 32.9*  MCV 84.2 82.7  MCH 28.0 27.9  MCHC 33.2 33.7  RDW 14.6 14.6  PLT 271 276   Thyroid No results for input(s): TSH, FREET4 in the last 168 hours.  BNP Recent Labs  Lab 10/29/24 1513  BNP 502.9*    DDimer  Recent Labs  Lab 10/29/24 1513  DDIMER <0.27     Radiology  DG Chest 2 View Result Date: 10/29/2024 EXAM: 2 VIEW(S) XRAY OF THE CHEST 10/29/2024 04:35:00 PM COMPARISON: None available. CLINICAL HISTORY: dyspnea FINDINGS: LUNGS AND PLEURA: Pulmonary vascular congestion. Small bilateral pleural effusions. No pulmonary edema. No pneumothorax.  HEART AND MEDIASTINUM: Cardiomegaly. Aortic atherosclerosis. BONES AND SOFT TISSUES: No acute osseous abnormality. IMPRESSION: 1. Cardiomegaly with pulmonary vascular congestion and small bilateral pleural effusions. Electronically signed by: Norman Gatlin MD 10/29/2024 04:44 PM EST RP Workstation: [id number]    Cardiac Studies 2d echo 10/31/2024 1. Left ventricular ejection fraction, by estimation, is 45 to 50%. The  left ventricle has mildly decreased function. The left ventricle has no  regional wall motion abnormalities. Left ventricular diastolic parameters  were normal. The global  longitudinal strain is abnormal.   2. Right ventricular systolic function is normal. The right ventricular  size is normal.   3. The mitral valve is normal in structure. Mild mitral valve  regurgitation. No evidence of mitral stenosis.   4. The aortic valve is normal in structure. Aortic  valve regurgitation is  not visualized. No aortic stenosis is present.   5. The inferior vena cava is normal in size with greater than 50%  respiratory variability, suggesting right atrial pressure of 3 mmHg.   Patient Profile   89 y.o. male with a past medical history of paroxysmal atrial fibrillation, chronic chest pain, nonobstructive CAD (heart catheterization 2007), PVCs, chronic orthostasis, anxiety, GERD, IBS, hypertension, nonischemic cardiomyopathy, who has been seen and evaluated for heart failure.  Assessment & Plan  HFmrEF -Presented with shortness of breath and edema for the last 2 weeks - Chest x-ray completed at PCP revealed bilateral lateral pleural effusions and he was sent to the emergency department for further evaluation -Appears to be euvolemic on exam today --1.9 L output in the last 24 hours -BNP 502.9 - IV furosemide changed to oral furosemide 40 mg daily -Echocardiogram completed and revealed an LVEF of 45-50%, no RWMA, mild mitral regurgitation -Heart failure education -I's and O's and daily weights  Paroxysmal atrial fibrillation -Longstanding history of PAF -Currently maintaining sinus rhythm on telemetry -Continued on warfarin for CHA2DS2-VASc score of at least 4 for stroke prophylaxis -INR has remained therapeutic -Hemoglobin has remained stable without any concerns of active bleeding -PTA diltiazem  30 mg at bedtime remains on hold -Continue with telemetry monitoring  Hypertension -Blood pressure 139/75 - IV furosemide changed to oral furosemide -Vital signs per unit protocol  Hyperlipidemia -LDL 92 -Not currently on statin therapy  BPH -Ongoing management per IM   For questions or updates, please contact [facility] Please consult [email or web address] for contact info under       Signed, Lizzy Hamre, NP  10/31/2024, 9:11 AM

## 2024-10-31 NOTE — Plan of Care (Signed)

## 2024-10-31 NOTE — Discharge Summary (Signed)
 Physician Discharge Summary  Patient: Robert Burch FMW:[id number] DOB: [birth year]   Code Status: Full Code Admit date: 10/29/2024 Discharge date: 10/31/2024 Disposition: Home, No home health services recommended PCP: Glover Lenis, MD  Recommendations for Outpatient Follow-up:  Follow up with PCP within 1-2 weeks Regarding general hospital follow up and preventative care Recommend BMP Follow up with cardiology   Discharge Diagnoses:  Principal Problem:   CHF exacerbation [facility]) Active Problems:   Paroxysmal atrial fibrillation (HCC)   Essential hypertension   GERD (gastroesophageal reflux disease)   BPH (benign prostatic hyperplasia)   Hyperlipidemia   Congestive heart failure [facility])  Brief Hospital Course Summary: Robert Burch is a 89 y.o. male with a PMH significant for HTN, HLD, Paroxysmal Afib, and BPH presenting to the ED from urgent care for shortness of breath with exertion.    On arrival to the ED patient was noted to be stable. CBC shows normal leukocyte count and mild anemia, BMP with mild hyponatremia, BNP elevated at 502 and CXR shows signs of vascular congestion.   They were initially treated with IV lasix.    Patient was admitted to medicine service for further workup and management of CHF exacerbation as outlined in detail below.   10/31/24 -feeling well. Weaned to room air with ambulation. Echo ordered but not completed yet. Still with LE edema. Continuing on IV lasix.  All other chronic conditions were treated with home medications.    Discharge Condition: {DISCHARGE CONDITION:19696}, improved Recommended discharge diet: {Discharge Ipzu:[id number]}  Consultations: ***  Procedures/Studies: ***   Allergies as of 10/31/2024       Reactions   Ciprofloxacin    Diarrhea and stomach pains   Lanoxin [digoxin]    Lopressor [metoprolol Tartrate]    Penicillins    Other reaction(s): Unknown   Sulfonamide Derivatives    Rash   Verapamil     Sulfa Antibiotics Rash        Medication List     STOP taking these medications    diltiazem  30 MG tablet Commonly known as: CARDIZEM        TAKE these medications    acetaminophen  325 MG tablet Commonly known as: TYLENOL  Take 650 mg by mouth every 6 (six) hours as needed.   acidophilus Caps capsule Take 1 capsule by mouth daily.   alendronate  70 MG tablet Commonly known as: FOSAMAX  Take 1 tablet (70 mg total) by mouth once a week.   alum & mag hydroxide-simeth 400-400-40 MG/5ML suspension Commonly known as: MAALOX PLUS Take 30 mLs by mouth every 4 (four) hours as needed for indigestion.   ascorbic acid 500 MG tablet Commonly known as: VITAMIN C Take 500 mg by mouth daily.   bisacodyl  5 MG EC tablet Commonly known as: DULCOLAX Take 1 tablet (5 mg total) by mouth daily as needed for moderate constipation.   calcium -vitamin D 500-5 MG-MCG tablet Commonly known as: OSCAL WITH D Take 1 tablet by mouth daily.   clotrimazole-betamethasone cream Commonly known as: LOTRISONE Apply 1 Application topically as needed.   cyanocobalamin 1000 MCG tablet Commonly known as: VITAMIN B12 Take 1,000 mcg by mouth daily.   finasteride  5 MG tablet Commonly known as: PROSCAR  Take 1 tablet (5 mg total) by mouth daily.   Fish Oil 1000 MG Caps Take 1,000 mg by mouth daily.   Flax Seed Oil 1000 MG Caps Take 1,000 mg by mouth daily.   flecainide  100 MG tablet Commonly known as: TAMBOCOR  Take 1 tablet (100 mg total) by  mouth every 12 (twelve) hours as needed (atrial fibrillation or palpitations).   furosemide 40 MG tablet Commonly known as: LASIX Take 1 tablet (40 mg total) by mouth daily. Start taking on: November 01, 2024   hydrocortisone 25 MG suppository Commonly known as: ANUSOL-HC Place 25 mg rectally daily as needed for hemorrhoids or anal itching.   lidocaine  4 % Place 1 patch onto the skin. As needed   magnesium  oxide 400 MG tablet Commonly known as:  MAG-OX Take 1 tablet (400 mg total) by mouth 2 (two) times daily.   multivitamin tablet Take 1 tablet by mouth daily.   Orajel 2X Toothache & Gum 20-0.26 % Gel Generic drug: Benzocaine-Menthol  Use as directed in the mouth or throat every 6 (six) hours as needed.   pantoprazole  20 MG tablet Commonly known as: PROTONIX  Take 20 mg by mouth daily.   polyethylene glycol 17 g packet Commonly known as: MIRALAX  / GLYCOLAX  Take 17 g by mouth daily as needed for mild constipation or moderate constipation.   tamsulosin  0.4 MG Caps capsule Commonly known as: FLOMAX  One capsule po at bedtime (start on 10/07/23)   Vitamin D3 10 MCG (400 UNIT) Caps Take 800 Units by mouth daily.   warfarin 2.5 MG tablet Commonly known as: COUMADIN  Take 2.5 mg by mouth daily.        Follow-up Information     [facility]. Go on 11/10/2024.   Specialty: Cardiology Why: Hospital Follow-Up 11/10/24 @ 1:30 PM Please bring all medications to follow-up appointment [facility], [address] Second Floor Free Valet Parking at the door Contact information: [address] [place]  [address] [phone]                Subjective   Pt reports ***  All questions and concerns were addressed at time of discharge.  Objective  Blood pressure 120/65, pulse (!) 54, temperature (!) 97.5 F (36.4 C), resp. rate 18, height 5' 9 (1.753 m), weight 89.3 kg, SpO2 96%.   General: Pt is alert, awake, not in acute distress Cardiovascular: RRR, S1/S2 +, no rubs, no gallops Respiratory: CTA bilaterally, no wheezing, no rhonchi Abdominal: Soft, NT, ND, bowel sounds + Extremities: no edema, no cyanosis  The results of significant diagnostics from this hospitalization (including imaging, microbiology, ancillary and laboratory) are listed below for reference.   Imaging studies: ECHOCARDIOGRAM COMPLETE Result Date: 10/31/2024     ECHOCARDIOGRAM REPORT   Patient Name:   Robert Burch [facility] Date of Exam: 10/31/2024 Medical Rec #:  [id number]             Height:       69.0 in Accession #:    [id number]            Weight:       196.9 lb Date of Birth:  May 17, 1933            BSA:          2.052 m Patient Age:    89 years              BP:           150/88 mmHg Patient Gender: M                     HR:           57 bpm. Exam Location:  [facility] Procedure: 2D Echo, Cardiac Doppler, Color Doppler, 3D Echo  and Strain Analysis            (Both Spectral and Color Flow Doppler were utilized during            procedure). Indications:     CHF-acute diastolic I50.31  History:         Patient has prior history of Echocardiogram examinations, most                  recent 10/30/2020. Risk Factors:Hypertension. Non-ischemic                  cardiomyopathy, Paroxysmal atrial fibrillation.  Sonographer:     Christopher Furnace Referring Phys:  [id number] MARIEN LITTIE PIETY Diagnosing Phys: Marsa Dooms MD  Sonographer Comments: Global longitudinal strain was attempted. IMPRESSIONS  1. Left ventricular ejection fraction, by estimation, is 45 to 50%. The left ventricle has mildly decreased function. The left ventricle has no regional wall motion abnormalities. Left ventricular diastolic parameters were normal. The global longitudinal strain is abnormal.  2. Right ventricular systolic function is normal. The right ventricular size is normal.  3. The mitral valve is normal in structure. Mild mitral valve regurgitation. No evidence of mitral stenosis.  4. The aortic valve is normal in structure. Aortic valve regurgitation is not visualized. No aortic stenosis is present.  5. The inferior vena cava is normal in size with greater than 50% respiratory variability, suggesting right atrial pressure of 3 mmHg. FINDINGS  Left Ventricle: Left ventricular ejection fraction, by estimation, is 45 to 50%. The left ventricle has mildly decreased function. The left ventricle has no  regional wall motion abnormalities. Strain was performed and the global longitudinal strain is abnormal. The left ventricular internal cavity size was normal in size. There is no left ventricular hypertrophy. Left ventricular diastolic parameters were normal. Right Ventricle: The right ventricular size is normal. No increase in right ventricular wall thickness. Right ventricular systolic function is normal. Left Atrium: Left atrial size was normal in size. Right Atrium: Right atrial size was normal in size. Pericardium: There is no evidence of pericardial effusion. Mitral Valve: The mitral valve is normal in structure. Mild mitral valve regurgitation. No evidence of mitral valve stenosis. MV peak gradient, 2.8 mmHg. The mean mitral valve gradient is 1.0 mmHg. Tricuspid Valve: The tricuspid valve is normal in structure. Tricuspid valve regurgitation is mild . No evidence of tricuspid stenosis. Aortic Valve: The aortic valve is normal in structure. Aortic valve regurgitation is not visualized. No aortic stenosis is present. Aortic valve mean gradient measures 3.0 mmHg. Aortic valve peak gradient measures 5.2 mmHg. Aortic valve area, by VTI measures 2.25 cm. Pulmonic Valve: The pulmonic valve was normal in structure. Pulmonic valve regurgitation is not visualized. No evidence of pulmonic stenosis. Aorta: The aortic root is normal in size and structure. Venous: The inferior vena cava is normal in size with greater than 50% respiratory variability, suggesting right atrial pressure of 3 mmHg. IAS/Shunts: No atrial level shunt detected by color flow Doppler. Additional Comments: 3D was performed not requiring image post processing on an independent workstation and was abnormal.  LEFT VENTRICLE PLAX 2D LVIDd:         3.80 cm   Diastology LVIDs:         2.80 cm   LV e' medial:    6.53 cm/s LV PW:         1.50 cm   LV E/e' medial:  11.6 LV IVS:        1.80  cm   LV e' lateral:   12.50 cm/s LVOT diam:     2.00 cm   LV E/e'  lateral: 6.0 LV SV:         52 LV SV Index:   26 LVOT Area:     3.14 cm LV IVRT:       119 msec  RIGHT VENTRICLE RV Basal diam:  3.90 cm     PULMONARY VEINS RV Mid diam:    3.60 cm     Diastolic Velocity: 34.20 cm/s RV S prime:     12.00 cm/s  S/D Velocity:       0.50 TAPSE (M-mode): 2.0 cm      Systolic Velocity:  16.30 cm/s LEFT ATRIUM             Index        RIGHT ATRIUM           Index LA diam:        4.20 cm 2.05 cm/m   RA Area:     12.80 cm LA Vol (A2C):   93.6 ml 45.61 ml/m  RA Volume:   24.40 ml  11.89 ml/m LA Vol (A4C):   54.9 ml 26.75 ml/m LA Biplane Vol: 74.6 ml 36.35 ml/m  AORTIC VALVE AV Area (Vmax):    1.98 cm AV Area (Vmean):   1.84 cm AV Area (VTI):     2.25 cm AV Vmax:           114.00 cm/s AV Vmean:          76.500 cm/s AV VTI:            0.233 m AV Peak Grad:      5.2 mmHg AV Mean Grad:      3.0 mmHg LVOT Vmax:         71.90 cm/s LVOT Vmean:        44.900 cm/s LVOT VTI:          0.167 m LVOT/AV VTI ratio: 0.72  AORTA Ao Root diam: 3.80 cm MITRAL VALVE               TRICUSPID VALVE MV Area (PHT): 4.77 cm    TR Peak grad:   15.4 mmHg MV Area VTI:   1.84 cm    TR Vmax:        196.00 cm/s MV Peak grad:  2.8 mmHg MV Mean grad:  1.0 mmHg    SHUNTS MV Vmax:       0.83 m/s    Systemic VTI:  0.17 m MV Vmean:      47.2 cm/s   Systemic Diam: 2.00 cm MV Decel Time: 159 msec MV E velocity: 75.50 cm/s MV A velocity: 45.10 cm/s MV E/A ratio:  1.67 Marsa Dooms MD Electronically signed by Marsa Dooms MD Signature Date/Time: 10/31/2024/9:47:19 AM    Final    DG Chest 2 View Result Date: 10/29/2024 EXAM: 2 VIEW(S) XRAY OF THE CHEST 10/29/2024 04:35:00 PM COMPARISON: None available. CLINICAL HISTORY: dyspnea FINDINGS: LUNGS AND PLEURA: Pulmonary vascular congestion. Small bilateral pleural effusions. No pulmonary edema. No pneumothorax. HEART AND MEDIASTINUM: Cardiomegaly. Aortic atherosclerosis. BONES AND SOFT TISSUES: No acute osseous abnormality. IMPRESSION: 1. Cardiomegaly with  pulmonary vascular congestion and small bilateral pleural effusions. Electronically signed by: Norman Gatlin MD 10/29/2024 04:44 PM EST RP Workstation: [id number]    Labs: Basic Metabolic Panel: Recent Labs  Lab 10/29/24 1513 10/30/24 0335 10/31/24 1121  NA 133* 136 136  K 4.5  3.8 4.1  CL 101 102 99  CO2 25 24 28   GLUCOSE 98 90 99  BUN 18 17 20   CREATININE 1.05 0.87 1.04  CALCIUM  8.5* 8.2* 8.6*  MG 2.1 2.1  --    CBC: Recent Labs  Lab 10/29/24 1513 10/30/24 0335  WBC 7.0 6.7  HGB 11.0* 11.1*  HCT 33.1* 32.9*  MCV 84.2 82.7  PLT 271 276   Microbiology: Results for orders placed or performed in visit on 07/12/24  CULTURE, URINE COMPREHENSIVE     Status: Abnormal   Collection Time: 07/12/24  8:25 AM   Specimen: Urine   UR  Result Value Ref Range Status   Urine Culture, Comprehensive Final report (A)  Final   Organism ID, Bacteria Proteus mirabilis (A)  Final    Comment: Cefazolin  with an MIC <=16 predicts susceptibility to the oral agents cefaclor, cefdinir, cefpodoxime , cefprozil, cefuroxime , cephalexin, and loracarbef when used for therapy of uncomplicated urinary tract infections due to E. coli, Klebsiella pneumoniae, and Proteus mirabilis. Multi-Drug Resistant Organism Greater than 100,000 colony forming units per mL    Organism ID, Bacteria Comment  Final    Comment: Mixed urogenital flora 10,000-25,000 colony forming units per mL    ANTIMICROBIAL SUSCEPTIBILITY Comment  Final    Comment:       ** S = Susceptible; I = Intermediate; R = Resistant **                    P = Positive; N = Negative             MICS are expressed in micrograms per mL    Antibiotic                 RSLT#1    RSLT#2    RSLT#3    RSLT#4 Amoxicillin /Clavulanic Acid    S Ampicillin                     R Cefazolin                       S Cefepime                       S Cefoxitin                      S Cefpodoxime                     S Ceftriaxone                    S Ciprofloxacin                   S Ertapenem                      S Gentamicin                     I Meropenem                      S Nitrofurantoin                  R Piperacillin/Tazobactam        S Tetracycline                   R Tobramycin  S Trimethoprim/Sulfa             R   Microscopic Examination     Status: Abnormal   Collection Time: 07/12/24  8:25 AM   Urine  Result Value Ref Range Status   WBC, UA >30 (A) 0 - 5 /hpf Final   RBC, Urine 11-30 (A) 0 - 2 /hpf Final   Epithelial Cells (non renal) 0-10 0 - 10 /hpf Final   Mucus, UA Present (A) Not Estab. Final   Bacteria, UA Many (A) None seen/Few Final    Time coordinating discharge: Over 30 minutes  Marien LITTIE Piety, MD  [facility] 10/31/2024, 3:04 PM

## 2024-10-31 NOTE — Consult Note (Signed)
 PHARMACY - ANTICOAGULATION CONSULT NOTE  Pharmacy Consult for Warfarin Indication: atrial fibrillation  Allergies  Allergen Reactions   Ciprofloxacin     Diarrhea and stomach pains   Lanoxin [Digoxin]    Lopressor [Metoprolol Tartrate]    Penicillins     Other reaction(s): Unknown   Sulfonamide Derivatives     Rash    Verapamil    Sulfa Antibiotics Rash    Patient Measurements: Height: 5' 9 (175.3 cm) Weight: 89.3 kg (196 lb 13.9 oz) IBW/kg (Calculated) : 70.7 HEPARIN DW (KG): 88.4  Vital Signs: Temp: 97.5 F (36.4 C) (11/11 0749) BP: 139/75 (11/11 0749) Pulse Rate: 56 (11/11 0749)  Labs: Recent Labs    10/29/24 1513 10/30/24 0335 10/31/24 0411  HGB 11.0* 11.1*  --   HCT 33.1* 32.9*  --   PLT 271 276  --   LABPROT 29.9* 31.4* 28.2*  INR 2.7* 2.9* 2.5*  CREATININE 1.05 0.87  --   TROPONINIHS 10  --   --     Estimated Creatinine Clearance: 64.8 mL/min (by C-G formula based on SCr of 0.87 mg/dL).   Medical History: Past Medical History:  Diagnosis Date   Atypical chest pain    a. Nonobstructive, very minor irregs by cath 2007;  b. 06/2009 Ex MV: small area of apical lateral ischemia;  c. 06/2009 Cath: essentially normal;  d. 04/2014 Lexiscan  MV: EF 59%, no ischemia/infarct->low risk.   Cancer (HCC)    Chronic anticoagulation    a. Chronic Coumadin    Chronic anxiety    Diverticulosis    Dysrhythmia    atrial fib   GERD (gastroesophageal reflux disease)    Hypertension    IBS (irritable bowel syndrome)    Nonischemic cardiomyopathy (HCC)    a. EF 45% by echo 2010;  b. 05/2012 Echo: EF 55-65%, no rwma, Gr 1 DD, mild AI/MR, mildly dil LA.   Paralyzed vocal cords    Paroxysmal atrial fibrillation (HCC)    a. Treated with flecainide    Proteinuria    History of    Medications:  Scheduled:   finasteride   5 mg Oral Daily   [START ON 11/01/2024] furosemide  40 mg Oral Daily   lidocaine   1 patch Transdermal Q24H   magnesium  oxide  400 mg Oral BID    pantoprazole   20 mg Oral Daily   sodium chloride  flush  3 mL Intravenous Q12H   tamsulosin   0.4 mg Oral QPC supper   warfarin  2.5 mg Oral ONCE-1600   Warfarin - Pharmacist Dosing Inpatient   Does not apply q1600   CHA2DS2-VASc Score = 4   This indicates a 4.8% annual risk of stroke. The patient's score is based upon: CHF History: 1 HTN History: 1 Diabetes History: 0 Stroke History: 0 Vascular Disease History: 0 Age Score: 2 Gender Score: 0  Assessment: 89 yo male with PMH significant more HTN, CLD, paroxsymal Afib, and BPH, CHF. Admitted to ED on 10/29/24 for worsening SOB. Chronic PTA anticoagulation with warfarin 2.5 mg daily. Patient took 2.5 mg PTA on 11/9, given 2.5 mg on 11/10. INR remains in goal range. CBC and CMP are stable.  Most recent INR 10/31/24 @0411  = 2.5   Goal of Therapy:  INR 2-3 Monitor platelets by anticoagulation protocol: Yes   Plan:  - INR remain therapeutic  - Give warfarin 2.5 mg PO x1 today  - INR in am on 11/12 - CBC at least every 3 days   Signe Platt, PharmD Candidate 10/31/2024,11:29 AM

## 2024-10-31 NOTE — Discharge Instructions (Addendum)
 Your echocardiogram overall looked good.  Please review the heart failure action plan in your instruction. It will help you in a plan of taking your diuretic. You don't need to take it until you have a follow up appointment with your cardiology team. Right now, your fluid status is doing very well. Follow up with cardiology at your scheduled appointment.   I recommend follow up with your PCP as well to discuss your hospitalization and review if any other medication changes are needed

## 2024-10-31 NOTE — Evaluation (Signed)
 Physical Therapy Evaluation Patient Details Name: Robert Burch MRN: [id number] DOB: February 26, 1933 Today's Date: 10/31/2024  History of Present Illness  Pt is an 89 y/o M admitted on 10/29/24 after presenting from urgent care with c/o SOB with exertion. Pt treated for CHF exacerbation. PMH: HTN, HLD, paroxysmal a-fib, BPH  Clinical Impression  Pt seen for PT evaluation with pt agreeable, wife present during session. Pt reports prior to admission he was mod I with rollator, PRN assistance for bathing & dressing, notes 3 falls in the past 6 months. On this date, pt is able to complete transfers from various surfaces with CGA, ambulate with RW & CGA. While ambulating pt c/o slight lightheadedness, once back in room, question slower response time. Pt checked BP & pt noted to have orthostatic hypotension - MD made aware. Recommend ongoing PT services to progress mobility as able.  BP in LUE Sitting in recliner: 109/57 mmHg MAP 72, HR 61 bpm Standing at 0: 81/49 mmHg MAP 60, HR 64 bpm        If plan is discharge home, recommend the following: A little help with walking and/or transfers;A little help with bathing/dressing/bathroom;Assistance with cooking/housework;Assist for transportation;Help with stairs or ramp for entrance   Can travel by private vehicle        Equipment Recommendations None recommended by PT  Recommendations for Other Services       Functional Status Assessment Patient has had a recent decline in their functional status and demonstrates the ability to make significant improvements in function in a reasonable and predictable amount of time.     Precautions / Restrictions Precautions Precautions: Fall Restrictions Weight Bearing Restrictions Per Provider Order: No      Mobility  Bed Mobility               General bed mobility comments: not tested, pt received & left sitting in recliner    Transfers Overall transfer level: Needs assistance Equipment  used: Rolling walker (2 wheels) Transfers: Sit to/from Stand Sit to Stand: Contact guard assist           General transfer comment: sit>stand from recliner, standard chair with armrests    Ambulation/Gait Ambulation/Gait assistance: Contact guard assist Gait Distance (Feet): 200 Feet Assistive device: Rolling walker (2 wheels) Gait Pattern/deviations: Step-through pattern Gait velocity: decreased        Stairs            Wheelchair Mobility     Tilt Bed    Modified Rankin (Stroke Patients Only)       Balance Overall balance assessment: Needs assistance Sitting-balance support: Feet supported Sitting balance-Leahy Scale: Good     Standing balance support: During functional activity, Bilateral upper extremity supported, Reliant on assistive device for balance Standing balance-Leahy Scale: Fair                               Pertinent Vitals/Pain Pain Assessment Pain Assessment: Faces Faces Pain Scale: Hurts little more Pain Location: R knee pain (ongoing x ~1 month) Pain Descriptors / Indicators: Aching, Discomfort Pain Intervention(s): Monitored during session    Home Living Family/patient expects to be discharged to:: Private residence Living Arrangements: Spouse/significant other Available Help at Discharge: Family Type of Home: House Home Access: Stairs to enter Entrance Stairs-Rails: Right Entrance Stairs-Number of Steps: 3   Home Layout: Laundry or work area in basement;Able to live on main level with bedroom/bathroom Home Equipment: Agricultural Consultant (  2 wheels);Rollator (4 wheels);Other (comment) (bed rail)      Prior Function               Mobility Comments: ambulatory with rollator, notes 3 falls in the past 6 months ADLs Comments: PRN assist for bathing & dressing     Extremity/Trunk Assessment   Upper Extremity Assessment Upper Extremity Assessment: Overall WFL for tasks assessed    Lower Extremity  Assessment Lower Extremity Assessment: Generalized weakness       Communication   Communication Communication: No apparent difficulties    Cognition Arousal: Alert Behavior During Therapy: WFL for tasks assessed/performed   PT - Cognitive impairments: No apparent impairments                         Following commands: Intact       Cueing Cueing Techniques: Verbal cues     General Comments      Exercises     Assessment/Plan    PT Assessment Patient needs continued PT services  PT Problem List Decreased strength;Decreased activity tolerance;Decreased balance;Decreased mobility;Decreased knowledge of use of DME       PT Treatment Interventions DME instruction;Balance training;Gait training;Neuromuscular re-education;Stair training;Functional mobility training;Therapeutic activities;Therapeutic exercise;Patient/family education    PT Goals (Current goals can be found in the Care Plan section)  Acute Rehab PT Goals Patient Stated Goal: go home PT Goal Formulation: With patient/family Time For Goal Achievement: 11/14/24 Potential to Achieve Goals: Good    Frequency Min 2X/week     Co-evaluation               AM-PAC PT 6 Clicks Mobility  Outcome Measure Help needed turning from your back to your side while in a flat bed without using bedrails?: None Help needed moving from lying on your back to sitting on the side of a flat bed without using bedrails?: A Little Help needed moving to and from a bed to a chair (including a wheelchair)?: A Little Help needed standing up from a chair using your arms (e.g., wheelchair or bedside chair)?: A Little Help needed to walk in hospital room?: A Little Help needed climbing 3-5 steps with a railing? : A Little 6 Click Score: 19    End of Session   Activity Tolerance: Patient tolerated treatment well Patient left: in chair;with chair alarm set;with call bell/phone within reach;with family/visitor  present Nurse Communication: Mobility status (notified MD of BP) PT Visit Diagnosis: Muscle weakness (generalized) (M62.81)    Time: 8572-8553 PT Time Calculation (min) (ACUTE ONLY): 19 min   Charges:   PT Evaluation $PT Eval Low Complexity: 1 Low   PT General Charges $$ ACUTE PT VISIT: 1 Visit         Richerd Pinal, PT, DPT 10/31/24, 2:55 PM   Richerd CHRISTELLA Pinal 10/31/2024, 2:53 PM

## 2024-10-31 NOTE — Plan of Care (Signed)
   Problem: Education: Goal: Knowledge of General Education information will improve Description Including pain rating scale, medication(s)/side effects and non-pharmacologic comfort measures Outcome: Progressing

## 2024-10-31 NOTE — TOC CM/SW Note (Signed)
 Transition of Care [facility]) - Inpatient Brief Assessment   Patient Details  Name: Robert Burch MRN: [id number] Date of Birth: 1933/07/30  Transition of Care [facility]) CM/SW Contact:    Lauraine JAYSON Carpen, LCSW Phone Number: 10/31/2024, 10:18 AM   Clinical Narrative: CSW reviewed chart. No TOC needs identified so far. CSW will continue to follow progress. Please place [facility] consult if any needs arise.  Transition of Care Asessment: Insurance and Status: Insurance coverage has been reviewed Patient has primary care physician: Yes Home environment has been reviewed: Single family home Prior level of function:: Not documented Prior/Current Home Services: No current home services Social Drivers of Health Review: SDOH reviewed no interventions necessary Readmission risk has been reviewed: Yes Transition of care needs: no transition of care needs at this time

## 2024-10-31 NOTE — Plan of Care (Signed)
 Problem: Education: Goal: Knowledge of General Education information will improve Description: Including pain rating scale, medication(s)/side effects and non-pharmacologic comfort measures 10/31/2024 1550 by Arloa Dene KIDD, RN Outcome: Adequate for Discharge 10/31/2024 1008 by Arloa Dene KIDD, RN Outcome: Progressing   Problem: Health Behavior/Discharge Planning: Goal: Ability to manage health-related needs will improve 10/31/2024 1550 by Arloa Dene KIDD, RN Outcome: Adequate for Discharge 10/31/2024 1008 by Arloa Dene KIDD, RN Outcome: Progressing   Problem: Clinical Measurements: Goal: Ability to maintain clinical measurements within normal limits will improve 10/31/2024 1550 by Arloa Dene KIDD, RN Outcome: Adequate for Discharge 10/31/2024 1008 by Arloa Dene KIDD, RN Outcome: Progressing Goal: Will remain free from infection 10/31/2024 1550 by Arloa Dene KIDD, RN Outcome: Adequate for Discharge 10/31/2024 1008 by Arloa Dene KIDD, RN Outcome: Progressing Goal: Diagnostic test results will improve 10/31/2024 1550 by Arloa Dene KIDD, RN Outcome: Adequate for Discharge 10/31/2024 1008 by Arloa Dene KIDD, RN Outcome: Progressing Goal: Respiratory complications will improve 10/31/2024 1550 by Arloa Dene KIDD, RN Outcome: Adequate for Discharge 10/31/2024 1008 by Arloa Dene KIDD, RN Outcome: Progressing Goal: Cardiovascular complication will be avoided 10/31/2024 1550 by Arloa Dene KIDD, RN Outcome: Adequate for Discharge 10/31/2024 1008 by Arloa Dene KIDD, RN Outcome: Progressing   Problem: Activity: Goal: Risk for activity intolerance will decrease 10/31/2024 1550 by Arloa Dene KIDD, RN Outcome: Adequate for Discharge 10/31/2024 1008 by Arloa Dene KIDD, RN Outcome: Progressing   Problem: Nutrition: Goal: Adequate nutrition will be maintained 10/31/2024 1550 by Arloa Dene KIDD, RN Outcome: Adequate for  Discharge 10/31/2024 1008 by Arloa Dene KIDD, RN Outcome: Progressing   Problem: Coping: Goal: Level of anxiety will decrease 10/31/2024 1550 by Arloa Dene KIDD, RN Outcome: Adequate for Discharge 10/31/2024 1008 by Arloa Dene KIDD, RN Outcome: Progressing   Problem: Elimination: Goal: Will not experience complications related to bowel motility 10/31/2024 1550 by Arloa Dene KIDD, RN Outcome: Adequate for Discharge 10/31/2024 1008 by Arloa Dene KIDD, RN Outcome: Progressing Goal: Will not experience complications related to urinary retention 10/31/2024 1550 by Arloa Dene KIDD, RN Outcome: Adequate for Discharge 10/31/2024 1008 by Arloa Dene KIDD, RN Outcome: Progressing   Problem: Pain Managment: Goal: General experience of comfort will improve and/or be controlled 10/31/2024 1550 by Arloa Dene KIDD, RN Outcome: Adequate for Discharge 10/31/2024 1008 by Arloa Dene KIDD, RN Outcome: Progressing   Problem: Safety: Goal: Ability to remain free from injury will improve 10/31/2024 1550 by Arloa Dene KIDD, RN Outcome: Adequate for Discharge 10/31/2024 1008 by Arloa Dene KIDD, RN Outcome: Progressing   Problem: Skin Integrity: Goal: Risk for impaired skin integrity will decrease 10/31/2024 1550 by Arloa Dene KIDD, RN Outcome: Adequate for Discharge 10/31/2024 1008 by Arloa Dene KIDD, RN Outcome: Progressing   Problem: Education: Goal: Ability to demonstrate management of disease process will improve 10/31/2024 1550 by Arloa Dene KIDD, RN Outcome: Adequate for Discharge 10/31/2024 1008 by Arloa Dene KIDD, RN Outcome: Progressing Goal: Ability to verbalize understanding of medication therapies will improve 10/31/2024 1550 by Arloa Dene KIDD, RN Outcome: Adequate for Discharge 10/31/2024 1008 by Arloa Dene KIDD, RN Outcome: Progressing Goal: Individualized Educational Video(s) 10/31/2024 1550 by Arloa Dene KIDD,  RN Outcome: Adequate for Discharge 10/31/2024 1008 by Arloa Dene KIDD, RN Outcome: Progressing   Problem: Activity: Goal: Capacity to carry out activities will improve 10/31/2024 1550 by Arloa Dene KIDD, RN Outcome: Adequate for Discharge 10/31/2024 1008 by Arloa Dene KIDD, RN Outcome: Progressing   Problem: Cardiac: Goal: Ability to achieve and maintain  adequate cardiopulmonary perfusion will improve 10/31/2024 1550 by Arloa Dene KIDD, RN Outcome: Adequate for Discharge 10/31/2024 1008 by Arloa Dene KIDD, RN Outcome: Progressing

## 2024-11-02 NOTE — Progress Notes (Signed)
 Cardiology Clinic Note   Date: 11/06/2024 ID: Torren Maffeo, DOB [birth year], MRN [id number]  [facility] Providers Cardiologist:  Evalene Lunger, MD Electrophysiologist:  Elspeth Sage, MD (Inactive)     Chief Complaint   Robert Burch is a 89 y.o. male who presents to the clinic today for hospital follow up.   Patient Profile   Romano Stigger is followed by Dr. [name] for the history outlined below.      Past medical history significant for: Nonobstructive CAD. Nuclear stress test 07/02/2015: Low risk study.  No significant ischemia.  Small region of perfusion abnormality in the apical region, worse at rest than stress, likely secondary to attenuation artifact. Chronic HFmrEF. Echo 10/31/2024: EF 45 to 50%.  No RWMA.  Normal diastolic parameters.  Normal RV size/function.  Mild MR. PAF. Hypertension. Hyperlipidemia. GERD. BPH.  In summary, patient has a history of nonobstructive CAD seen on catheterization in 2017.  Nuclear stress testing in July 2016 was a low risk study with no ischemia.  He has a history of PAF on Coumadin  and flecainide .  He has a long history of orthostatic hypotension for which he has seen EP.  Echo November 2021 demonstrated EF 55 to 60%, no RWMA, mild LVH, Grade I DD, normal RV size/function, normal PA pressure, mild LAE, mild MR, mildly systolic prolapse of posterior leaflet of the mitral valve.  Last visit with Dr. Sage was 12/20/2023.  He complained of DOE at that time.  He continued to have lightheadedness.  He reported several falls.  Patient was last seen in the office by Dr. [name] on 08/15/2024 for routine follow-up.  He was managing orthostasis with hydration and compression.  He had decreased Flomax  to once a day.  He was encouraged to continue walking for exercise.  No medication changes were made.  Patient presented to the ED on 10/29/2024 for shortness of breath and abnormal chest x-ray showing vascular  congestion and small bilateral pleural effusions.  He reported a 1 week history of lower extremity edema, DOE, and orthopnea.  He denied chest pain.  Initial labs: WBC 7, hemoglobin 11, hematocrit 33.1, sodium 133, potassium 4.5, BUN 18, creatinine 1.05, BNP 502.9, D-dimer <0.27, magnesium  2.1.  Negative troponin.  He was diuresed with IV Lasix.  Echo demonstrated EF 45 to 50%.  During admission patient's heart rate was noted to be in the 50s and diltiazem  was held.  He was transitioned to oral Lasix.  There was some concern secondary to his long history of orthostatic hypotension.  He was discharged on 10/31/2024.     History of Present Illness    Today, patient is accompanied by his son. He reports doing well since hospital discharge. He reports mild lower extremity edema at the end of the day. Daily weight has been stable. He denies chest pain, shortness of breath, orthopnea or PND. He has not been taking oral Lasix, as he was told to wait until he sees advanced heart failure clinic. He has occasional lightheadedness typically related to position changes. No dizziness, presyncope or syncope. No recent falls. He walks daily for exercise.     ROS: All other systems reviewed and are otherwise negative except as noted in History of Present Illness.  EKGs/Labs Reviewed       EKG not performed today.   10/31/2024: BUN 20; Creatinine, Ser 1.04; Potassium 4.1; Sodium 136   10/30/2024: Hemoglobin 11.1; WBC 6.7   No results found for requested labs within last 365  days.   10/29/2024: B Natriuretic Peptide 502.9   Risk Assessment/Calculations     CHA2DS2-VASc Score = 4   This indicates a 4.8% annual risk of stroke. The patient's score is based upon: CHF History: 1 HTN History: 1 Diabetes History: 0 Stroke History: 0 Vascular Disease History: 0 Age Score: 2 Gender Score: 0             Physical Exam    VS:  BP 126/71   Pulse (!) 58   Ht 5' 6 (1.676 m)   Wt 186 lb 12.8 oz (84.7  kg)   SpO2 97%   BMI 30.15 kg/m  , BMI Body mass index is 30.15 kg/m.  GEN: Well nourished, well developed, in no acute distress. Neck: No JVD or carotid bruits. Cardiac:  RRR.  No murmur. No rubs or gallops.   Respiratory:  Respirations regular and unlabored. Clear to auscultation without rales, wheezing or rhonchi. GI: Soft, nontender, nondistended. Extremities: Radials/DP/PT 2+ and equal bilaterally. No clubbing or cyanosis. Mild, nonpitting edema L>R lower extremities.   Skin: Warm and dry, no rash. Neuro: Strength intact.  Assessment & Plan   Nonobstructive CAD Per angiography in 2007.  Nuclear stress test July 2016 was a low risk study with no significant ischemia.  Patient denies chest pain. He walks daily for exercise with good tolerance.  - Not on aspirin secondary to Coumadin .  Chronic HFmrEF Echo 10/31/2024 demonstrated EF 45 to 50%, normal diastolic parameters, normal RV size/function, mild MR.  Patient reports mild lower extremity edema at the end of the day. He denies dyspnea, orthopnea or PND. Daily weight has been stable since hospital discharge. He has not been taking Lasix, as he was told upon hospital discharge to wait until he sees advanced heart failure clinic. Mild, nonpitting edema L>R lower extremities. Euvolemic and well compensated on exam. - Daily Lasix not indicated today.  - Keep follow up with AHF clinic on 11/21.   PAF Denies spontaneous bleeding concerns.  Patient denies palpitations.  Diltiazem  was held during hospital admission secondary to bradycardia.  Heart rate today 58 bpm. RRR on exam.  - Continue Coumadin , as needed flecainide . Coumadin  managed by PCP.  - Continue to hold diltiazem .  Orthostatic hypotension BP today 126/71. Patient reports occasional lightheadedness particularly with position changes. No dizziness, presyncope or syncope. No recent falls.  - Continue to hold diltiazem .   Disposition: Keep follow up with AHF clinic on 11/21.  Return in 6 months or sooner as needed.          Signed, Barnie HERO. Caellum Mancil, DNP, NP-C

## 2024-11-06 ENCOUNTER — Ambulatory Visit: Attending: Student | Admitting: Student

## 2024-11-06 ENCOUNTER — Encounter: Payer: Self-pay | Admitting: Student

## 2024-11-06 VITALS — BP 126/71 | HR 58 | Ht 66.0 in | Wt 186.8 lb

## 2024-11-06 DIAGNOSIS — I2510 Atherosclerotic heart disease of native coronary artery without angina pectoris: Secondary | ICD-10-CM

## 2024-11-06 DIAGNOSIS — I480 Paroxysmal atrial fibrillation: Secondary | ICD-10-CM | POA: Diagnosis not present

## 2024-11-06 DIAGNOSIS — I5022 Chronic systolic (congestive) heart failure: Secondary | ICD-10-CM

## 2024-11-06 NOTE — Patient Instructions (Addendum)
 Medication Instructions:  Your physician recommends that you continue on your current medications as directed. Please refer to the Current Medication list given to you today.   *If you need a refill on your cardiac medications before your next appointment, please call your pharmacy*  Lab Work: No labs ordered today  If you have labs (blood work) drawn today and your tests are completely normal, you will receive your results only by: MyChart Message (if you have MyChart) OR A paper copy in the mail If you have any lab test that is abnormal or we need to change your treatment, we will call you to review the results.  Testing/Procedures: No test ordered today   Follow-Up: At [facility], you and your health needs are our priority.  As part of our continuing mission to provide you with exceptional heart care, our providers are all part of one team.  This team includes your primary Cardiologist (physician) and Advanced Practice Providers or APPs (Physician Assistants and Nurse Practitioners) who all work together to provide you with the care you need, when you need it.  Your next appointment:   6 month(s)  Provider:   You may see Barnie Hila, NP  We recommend signing up for the patient portal called MyChart.  Sign up information is provided on this After Visit Summary.  MyChart is used to connect with patients for Virtual Visits (Telemedicine).  Patients are able to view lab/test results, encounter notes, upcoming appointments, etc.  Non-urgent messages can be sent to your provider as well.   To learn more about what you can do with MyChart, go to [email or web address].

## 2024-11-09 ENCOUNTER — Telehealth: Payer: Self-pay | Admitting: Family

## 2024-11-09 NOTE — Telephone Encounter (Signed)
 Called to confirm/remind patient of their appointment at the [facility] on 11/10/24.   Appointment:   [x] Confirmed  [] Left mess   [] No answer/No voice mail  [] VM Full/unable to leave message  [] Phone not in service  Patient reminded to bring all medications and/or complete list.  Confirmed patient has transportation. Gave directions, instructed to utilize valet parking.

## 2024-11-10 ENCOUNTER — Ambulatory Visit: Attending: Family | Admitting: Family

## 2024-11-10 ENCOUNTER — Encounter: Payer: Self-pay | Admitting: Family

## 2024-11-10 VITALS — BP 149/70 | HR 56 | Wt 190.2 lb

## 2024-11-10 DIAGNOSIS — N400 Enlarged prostate without lower urinary tract symptoms: Secondary | ICD-10-CM | POA: Diagnosis not present

## 2024-11-10 DIAGNOSIS — E785 Hyperlipidemia, unspecified: Secondary | ICD-10-CM | POA: Insufficient documentation

## 2024-11-10 DIAGNOSIS — G20C Parkinsonism, unspecified: Secondary | ICD-10-CM | POA: Insufficient documentation

## 2024-11-10 DIAGNOSIS — I10 Essential (primary) hypertension: Secondary | ICD-10-CM | POA: Diagnosis not present

## 2024-11-10 DIAGNOSIS — M25561 Pain in right knee: Secondary | ICD-10-CM | POA: Insufficient documentation

## 2024-11-10 DIAGNOSIS — Z79899 Other long term (current) drug therapy: Secondary | ICD-10-CM | POA: Insufficient documentation

## 2024-11-10 DIAGNOSIS — I2510 Atherosclerotic heart disease of native coronary artery without angina pectoris: Secondary | ICD-10-CM | POA: Diagnosis not present

## 2024-11-10 DIAGNOSIS — I480 Paroxysmal atrial fibrillation: Secondary | ICD-10-CM | POA: Diagnosis not present

## 2024-11-10 DIAGNOSIS — Z7901 Long term (current) use of anticoagulants: Secondary | ICD-10-CM | POA: Diagnosis not present

## 2024-11-10 DIAGNOSIS — I110 Hypertensive heart disease with heart failure: Secondary | ICD-10-CM | POA: Insufficient documentation

## 2024-11-10 DIAGNOSIS — I5030 Unspecified diastolic (congestive) heart failure: Secondary | ICD-10-CM | POA: Insufficient documentation

## 2024-11-10 DIAGNOSIS — I5022 Chronic systolic (congestive) heart failure: Secondary | ICD-10-CM | POA: Diagnosis not present

## 2024-11-10 MED ORDER — FUROSEMIDE 40 MG PO TABS: 40.0000 mg | ORAL_TABLET | Freq: Every day | ORAL | 3 refills | Status: DC

## 2024-11-10 NOTE — Patient Instructions (Signed)
 Medication Changes:  START Furosemide  40mg  (1 tab) daily  Lab Work:  Please have your blood work drawn and faxed back to us  at [phone]. You have been given a prescription today for the blood work.     Follow-Up in: Please follow up with the [facility] in 2 weeks with Ellouise Class, FNP.   Thank you for choosing [facility].    At the [facility], you and your health needs are our priority. We have a designated team specialized in the treatment of Heart Failure. This Care Team includes your primary Heart Failure Specialized Cardiologist (physician), Advanced Practice Providers (APPs- Physician Assistants and Nurse Practitioners), and Pharmacist who all work together to provide you with the care you need, when you need it.   You may see any of the following providers on your designated Care Team at your next follow up:  Dr. Toribio Fuel Dr. Ezra Shuck Dr. Ria Commander Dr. Morene Brownie Ellouise Class, FNP Jaun Bash, RPH-CPP  Please be sure to bring in all your medications bottles to every appointment.   Need to Contact Us :  If you have any questions or concerns before your next appointment please send us  a message through [place] or call our office at [phone].    TO LEAVE A MESSAGE FOR THE NURSE SELECT OPTION 2, PLEASE LEAVE A MESSAGE INCLUDING: YOUR NAME DATE OF BIRTH CALL BACK NUMBER REASON FOR CALL**this is important as we prioritize the call backs  YOU WILL RECEIVE A CALL BACK THE SAME DAY AS LONG AS YOU CALL BEFORE 4:00 PM

## 2024-11-10 NOTE — Progress Notes (Signed)
 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  History of nonobstructive CAD seen on catheterization in 2017.  Nuclear stress testing in July 2016 was a low risk study with no ischemia.  He has a history of PAF on Coumadin  and flecainide .  He has a long history of orthostatic hypotension for which he has seen EP.  Echo 10/29/20: EF 55 to 60%, no RWMA, mild LVH, Grade I DD, normal RV size/function, normal PA pressure, mild LAE, mild Robert, mildly systolic prolapse of posterior leaflet of the mitral valve.  Admitted 10/29/2024 for shortness of breath and abnormal chest x-ray showing vascular congestion and small bilateral pleural effusions.  He reported a 1 week history of lower extremity edema, DOE, and orthopnea.  He denied chest pain.  Initial labs: WBC 7, hemoglobin 11, hematocrit 33.1, sodium 133, potassium 4.5, BUN 18, creatinine 1.05, BNP 502.9, D-dimer <0.27, magnesium  2.1.  Negative troponin.  He was diuresed with IV Lasix .  Echo 10/31/24: EF 45 to 50%, normal RV, mild Robert.  During admission patient's heart rate was noted to be in the 50s and diltiazem  was held.  He was transitioned to oral Lasix .  There was some concern secondary to his long history of orthostatic hypotension.  He was discharged on 10/31/2024.  He presents today, with his son, for an initial TOC HF visit with a chief complaint of minimal shortness of breath. Has associated occasional dizziness, slight weight gain since discharge, pedal edema, right knee pain. Hasn't started furosemide  yet as he was told to wait until he was seen in clinic today. Denies any chest pain, palpitations or difficulty sleeping.    Weighing daily. Denies tobacco, alcohol, drug use.    Review of Systems: [y] = yes, [ ]  = no   General: Weight gain [ ] ; Weight loss [ ] ; Anorexia [ ] ; Fatigue [ ] ; Fever [ ] ; Chills [ ] ; Weakness [ ]   Cardiac: Chest pain/pressure [ ] ; Resting SOB [ ] ; Exertional SOB davis.dad ]; Orthopnea [ ] ; Pedal Edema [ y]; Palpitations [ ] ; Syncope [ ] ; Presyncope [ ] ; Paroxysmal nocturnal dyspnea[ ]   Pulmonary: Cough [ ] ; Wheezing[ ] ; Hemoptysis[ ] ; Sputum [ ] ; Snoring [ ]   GI: Vomiting[ ] ; Dysphagia[ ] ; Melena[ ] ; Hematochezia [ ] ; Heartburn[ ] ; Abdominal pain [ ] ; Constipation [ ] ; Diarrhea [ ] ; BRBPR [ ]   GU: Hematuria[ ] ; Dysuria [ ] ; Nocturia[ ]   Vascular: Pain in legs with walking [ ] ; Pain in feet with lying flat [ ] ; Non-healing sores [ ] ; Stroke [ ] ; TIA [ ] ; Slurred speech [ ] ;  Neuro: Dizziness[y ]; Vertigo[ ] ; Seizures[ ] ; Paresthesias[ ] ;Blurred vision [ ] ; Diplopia [ ] ; Vision changes [ ]   Ortho/Skin: Arthritis [ ] ; Joint pain [ y]; Muscle pain [ ] ; Joint swelling [ ] ; Back Pain [ ] ; Rash [ ]   Psych: Depression[ ] ; Anxiety[ ]   Heme: Bleeding problems [ ] ; Clotting disorders [ ] ; Anemia davis.dad ]  Endocrine: Diabetes [ ] ; Thyroid dysfunction[ ]    Past Medical History:  Diagnosis Date   Atypical chest pain    a. Nonobstructive, very minor irregs by cath 2007;  b. 06/2009 Ex MV: small area of apical lateral  ischemia;  c. 06/2009 Cath: essentially normal;  d. 04/2014 Lexiscan  MV: EF 59%, no ischemia/infarct->low risk.   Cancer (HCC)    Chronic anticoagulation    a. Chronic Coumadin    Chronic anxiety    Diverticulosis    Dysrhythmia    atrial fib   GERD (gastroesophageal reflux disease)    Hypertension    IBS (irritable bowel syndrome)    Nonischemic cardiomyopathy (HCC)    a. EF 45% by echo 2010;  b. 05/2012 Echo: EF 55-65%, no rwma, Gr 1 DD, mild AI/Robert, mildly dil LA.   Paralyzed vocal cords    Paroxysmal atrial fibrillation (HCC)    a. Treated with flecainide    Proteinuria    History of     Current Outpatient Medications  Medication Sig Dispense Refill   acetaminophen  (TYLENOL ) 325 MG tablet Take 650 mg by mouth every 6 (six) hours as needed.     acidophilus (RISAQUAD) CAPS capsule Take 1 capsule by mouth daily.     alendronate  (FOSAMAX ) 70 MG tablet Take 1 tablet (70 mg total) by mouth once a week. 4 tablet 0   alum & mag hydroxide-simeth (MAALOX PLUS) 400-400-40 MG/5ML suspension Take 30 mLs by mouth every 4 (four) hours as needed for indigestion.     ascorbic acid (VITAMIN C) 500 MG tablet Take 500 mg by mouth daily.     Benzocaine-Menthol  (ORAJEL 2X TOOTHACHE & GUM) 20-0.26 % GEL Use as directed in the mouth or throat every 6 (six) hours as needed.     bisacodyl  (DULCOLAX) 5 MG EC tablet Take 1 tablet (5 mg total) by mouth daily as needed for moderate constipation.     calcium -vitamin D (OSCAL WITH D) 500-5 MG-MCG tablet Take 1 tablet by mouth daily. (Patient not taking: Reported on 11/06/2024) 30 tablet 0   Cholecalciferol  (VITAMIN D3) 400 UNITS CAPS Take 800 Units by mouth daily.      clotrimazole-betamethasone (LOTRISONE) cream Apply 1 Application topically as needed.     cyanocobalamin (VITAMIN B12) 1000 MCG tablet Take 1,000 mcg by mouth daily.     finasteride  (PROSCAR ) 5 MG tablet Take 1 tablet (5 mg total) by mouth daily. 90 tablet 3   Flaxseed, Linseed, (FLAX SEED OIL) 1000 MG CAPS Take 1,000 mg by mouth daily.      flecainide  (TAMBOCOR ) 100 MG tablet Take 1 tablet (100 mg total) by mouth every 12 (twelve) hours as needed (atrial fibrillation or palpitations). 60 tablet 0   furosemide  (LASIX ) 40 MG tablet Take 1 tablet (40 mg total) by mouth daily. (Patient not taking: Reported on 11/06/2024) 60 tablet 0   hydrocortisone (ANUSOL-HC) 25 MG suppository Place 25 mg rectally daily as needed for hemorrhoids or anal itching.     lidocaine  4 % Place 1 patch onto the skin. As needed     magnesium  oxide (MAG-OX) 400 MG tablet Take 1 tablet (400 mg total) by mouth 2 (two)  times daily. 180 tablet 3   Multiple Vitamin (MULTIVITAMIN) tablet Take 1 tablet by mouth daily.       Omega-3 Fatty Acids (FISH OIL) 1000 MG CAPS Take 1,000 mg by mouth daily.      pantoprazole  (PROTONIX ) 20 MG tablet Take 20 mg by mouth daily.     polyethylene glycol (MIRALAX  / GLYCOLAX ) packet Take 17 g by mouth daily as needed for mild constipation or moderate constipation.     tamsulosin  (FLOMAX ) 0.4 MG CAPS capsule One capsule po at bedtime (start on 10/07/23)  warfarin (COUMADIN ) 2.5 MG tablet Take 2.5 mg by mouth daily.     No current facility-administered medications for this visit.    Allergies  Allergen Reactions   Ciprofloxacin     Diarrhea and stomach pains   Lanoxin [Digoxin]    Lopressor [Metoprolol Tartrate]    Penicillins     Other reaction(s): Unknown   Sulfonamide Derivatives     Rash    Verapamil    Sulfa Antibiotics Rash      Social History   Socioeconomic History   Marital status: Married    Spouse name: Not on file   Number of children: 3   Years of education: Not on file   Highest education level: Not on file  Occupational History   Occupation: Art Gallery Manager from [employer]    Employer: RETIRED    Comment: Retired  Tobacco Use   Smoking status: Never   Smokeless tobacco: Never  Vaping Use   Vaping status: Never Used  Substance and Sexual Activity   Alcohol use: No    Alcohol/week: 2.0 standard drinks of alcohol    Types: 1 Glasses of wine, 1 Standard drinks or equivalent per week    Comment: occassionally   Drug use: No   Sexual activity: Not on file  Other Topics Concern   Not on file  Social History Narrative   Pt lives in [place] with his wife.  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[employer]   Social Drivers of Corporate Investment Banker Strain: Low Risk  (08/10/2023)   Received from [facility]   Overall Financial Resource Strain (CARDIA)    Difficulty of Paying Living Expenses: Not hard at all  Food Insecurity: No Food  Insecurity (10/30/2024)   Hunger Vital Sign    Worried About Running Out of Food in the Last Year: Never true    Ran Out of Food in the Last Year: Never true  Transportation Needs: No Transportation Needs (10/30/2024)   PRAPARE - Administrator, Civil Service (Medical): No    Lack of Transportation (Non-Medical): No  Physical Activity: Not on file  Stress: Not on file  Social Connections: Unknown (10/30/2024)   Social Connection and Isolation Panel    Frequency of Communication with Friends and Family: Patient declined    Frequency of Social Gatherings with Friends and Family: Patient declined    Attends Religious Services: Patient declined    Database Administrator or Organizations: Patient declined    Attends Banker Meetings: Patient declined    Marital Status: Married  Catering Manager Violence: Not At Risk (10/30/2024)   Humiliation, Afraid, Rape, and Kick questionnaire    Fear of Current or Ex-Partner: No    Emotionally Abused: No    Physically Abused: No    Sexually Abused: No      Family History  Problem Relation Age of Onset   Heart attack Neg Hx    Vitals:   11/10/24 1350  BP: (!) 149/70  Pulse: (!) 56  SpO2: 100%  Weight: 190 lb 3.2 oz (86.3 kg)   Wt Readings from Last 3 Encounters:  11/10/24 190 lb 3.2 oz (86.3 kg)  11/06/24 186 lb 12.8 oz (84.7 kg)  10/31/24 196 lb 13.9 oz (89.3 kg)   Lab Results  Component Value Date   CREATININE 1.04 10/31/2024   CREATININE 0.87 10/30/2024   CREATININE 1.05 10/29/2024     PHYSICAL EXAM:  General: Well appearing.  Cor: No JVD.  Regular rhythm, bradycardic.  Lungs: clear Abdomen: soft, nontender, nondistended. Extremities:  2+ pitting edema BLE with L>R Neuro:. Affect pleasant, asks the same questions over   ECG: 10/29/24 was NSR   ASSESSMENT & PLAN:  1: HFpEF- - suspect due to AF - NYHA class II - minimally fluid up with pedal edema. Had been holding diuretic until seen in clinic  today - weighing daily and has gained some weight since discharge.  - Echo 10/29/20: EF 55 to 60%, no RWMA, mild LVH, Grade I DD, normal RV size/function, normal PA pressure, mild LAE, mild Robert, mildly systolic prolapse of posterior leaflet of the mitral valve. - Echo 10/31/24: EF 45 to 50%, normal RV, mild Robert although overread by cardiology as 55-60% - resume furosemide  40mg  daily until seen in clinic in 2 weeks. He is concerned about keeping a close eye on his kidney function while taking diuretic. BMET on 11/28/24 when he gets INR checked. If they can't do it, will do it at his appointment - not adding salt to his food. Reviewed keeping daily sodium content to 1500-2000mg  - BNP 10/29/24 was 502.9  2: HTN- - BP 149/70 - saw PCP Derinda) 11/25 - BMET 10/31/24 reviewed: sodium 136, potassium 4.1, creatinine 1.04, GFR >60. BMET in 2 weeks  3: PAF- - saw cardiology Florestine) 08/25 - continue warfarin - continue flecainide  100mg  PRN for palpitations   Return in 2 weeks, sooner if needed.   I spent 35 minutes reviewing records, interviewing/ examing patient and managing plan/ orders.   Ellouise DELENA Class, FNP-C 11/10/24

## 2024-11-30 ENCOUNTER — Telehealth: Payer: Self-pay | Admitting: Family

## 2024-11-30 NOTE — Progress Notes (Unsigned)
 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  History of nonobstructive CAD seen on catheterization in 2017.  Nuclear stress testing in July 2016 was a low risk study with no ischemia.  He has a history of PAF on Coumadin  and flecainide .  He has a long history of orthostatic hypotension for which he has seen EP.  Echo 10/29/20: EF 55 to 60%, no RWMA, mild LVH, Grade I DD, normal RV size/function, normal PA pressure, mild LAE, mild Robert, mildly systolic prolapse of posterior leaflet of the mitral valve.  Admitted 10/29/2024 for shortness of breath and abnormal chest x-ray showing vascular congestion and small bilateral pleural effusions.  He reported a 1 week history of lower extremity edema, DOE, and orthopnea.  He denied chest pain.  Initial labs: WBC 7, hemoglobin 11, hematocrit 33.1, sodium 133, potassium 4.5, BUN 18, creatinine 1.05, BNP 502.9, D-dimer <0.27, magnesium  2.1.  Negative troponin.  He was diuresed with IV Lasix .  Echo 10/31/24: EF 45 to 50%, normal RV, mild Robert.  During admission patient's heart rate was noted to be in the 50s and diltiazem  was held.  He was transitioned to oral Lasix .  There was some concern secondary to his long history of orthostatic hypotension.  He was discharged on 10/31/2024.  Seen in [facility] 11/10/24 and furosemide  was resumed as he had 2+ pitting edema.   Seen by Urgent Care 11/26/24 after a mechanical fall at home. He misjudged his position on a cedar chest at the foot of his bed and when he sat down he sat on a stack of cushions on top of the chest and fell onto the floor hitting his right side. Denied hitting his head. No fractures.  Was given prednisone for his chronic arthritis pain.    He presents today, with his son, for HF follow-up visit with a chief complaint of minimal shortness of breath. Has associated intermittent dizziness, intermittent leg cramping, hip, leg pain for the last few days. Denies any chest pain, palpitations, pedal edema. Reports a good appetite. Is currently on a prednisone taper but says that his hip/ leg pain is still pretty bad and is asking what else can be done for his arthritic pain. Urgent visit per above after a mechanical fall. No fractures reported.   ROS: All systems negative except what is listed in HPI, PMH and Problem List  Past Medical History:  Diagnosis Date   Atypical chest pain    a. Nonobstructive, very minor irregs by cath 2007;  b. 06/2009 Ex MV: small area of apical lateral ischemia;  c. 06/2009 Cath: essentially normal;  d. 04/2014 Lexiscan  MV: EF 59%, no ischemia/infarct->low risk.   Cancer (HCC)    Chronic anticoagulation    a. Chronic Coumadin    Chronic anxiety    Diverticulosis    Dysrhythmia    atrial fib   GERD (gastroesophageal reflux disease)    Hypertension    IBS (irritable bowel syndrome)    Nonischemic cardiomyopathy (HCC)    a. EF 45% by echo 2010;  b. 05/2012 Echo: EF 55-65%, no rwma, Gr 1 DD, mild AI/Robert, mildly dil LA.   Paralyzed vocal cords    Paroxysmal atrial fibrillation (HCC)  a. Treated with flecainide    Proteinuria    History of    Current Outpatient Medications  Medication Sig Dispense Refill   acetaminophen  (TYLENOL ) 325 MG tablet Take 650 mg by mouth every 6 (six) hours as needed.     acidophilus (RISAQUAD) CAPS capsule Take 1 capsule by mouth daily.     alendronate  (FOSAMAX ) 70 MG tablet Take 1 tablet (70 mg total) by mouth once a week. 4 tablet 0   alum & mag hydroxide-simeth (MAALOX PLUS) 400-400-40 MG/5ML suspension Take 30 mLs by mouth every 4 (four) hours as needed for indigestion.     ascorbic acid (VITAMIN C) 500 MG tablet Take  500 mg by mouth daily.     Benzocaine-Menthol  (ORAJEL 2X TOOTHACHE & GUM) 20-0.26 % GEL Use as directed in the mouth or throat every 6 (six) hours as needed.     bisacodyl  (DULCOLAX) 5 MG EC tablet Take 1 tablet (5 mg total) by mouth daily as needed for moderate constipation.     calcium -vitamin D (OSCAL WITH D) 500-5 MG-MCG tablet Take 1 tablet by mouth daily. 30 tablet 0   Cholecalciferol  (VITAMIN D3) 400 UNITS CAPS Take 800 Units by mouth daily.      clotrimazole-betamethasone (LOTRISONE) cream Apply 1 Application topically as needed.     cyanocobalamin (VITAMIN B12) 1000 MCG tablet Take 1,000 mcg by mouth daily.     finasteride  (PROSCAR ) 5 MG tablet Take 1 tablet (5 mg total) by mouth daily. (Patient not taking: Reported on 11/10/2024) 90 tablet 3   Flaxseed, Linseed, (FLAX SEED OIL) 1000 MG CAPS Take 1,000 mg by mouth daily.      flecainide  (TAMBOCOR ) 100 MG tablet Take 1 tablet (100 mg total) by mouth every 12 (twelve) hours as needed (atrial fibrillation or palpitations). 60 tablet 0   furosemide  (LASIX ) 40 MG tablet Take 1 tablet (40 mg total) by mouth daily. 60 tablet 3   hydrocortisone (ANUSOL-HC) 25 MG suppository Place 25 mg rectally daily as needed for hemorrhoids or anal itching.     lidocaine  4 % Place 1 patch onto the skin. As needed     magnesium  oxide (MAG-OX) 400 MG tablet Take 1 tablet (400 mg total) by mouth 2 (two) times daily. 180 tablet 3   Multiple Vitamin (MULTIVITAMIN) tablet Take 1 tablet by mouth daily.       Omega-3 Fatty Acids (FISH OIL) 1000 MG CAPS Take 1,000 mg by mouth daily.      pantoprazole  (PROTONIX ) 20 MG tablet Take 20 mg by mouth daily.     polyethylene glycol (MIRALAX  / GLYCOLAX ) packet Take 17 g by mouth daily as needed for mild constipation or moderate constipation.     tamsulosin  (FLOMAX ) 0.4 MG CAPS capsule One capsule po at bedtime (start on 10/07/23)     warfarin (COUMADIN ) 2.5 MG tablet Take 2.5 mg by mouth daily.     No current  facility-administered medications for this visit.    Allergies  Allergen Reactions   Ciprofloxacin     Diarrhea and stomach pains   Lanoxin [Digoxin]    Lopressor [Metoprolol Tartrate]    Penicillins     Other reaction(s): Unknown   Sulfonamide Derivatives     Rash    Verapamil    Sulfa Antibiotics Rash      Social History   Socioeconomic History   Marital status: Married    Spouse name: Not on file   Number of children: 3   Years of education: Not on  file   Highest education level: Not on file  Occupational History   Occupation: Art Gallery Manager from [employer]    Employer: RETIRED    Comment: Retired  Tobacco Use   Smoking status: Never   Smokeless tobacco: Never  Vaping Use   Vaping status: Never Used  Substance and Sexual Activity   Alcohol use: No    Alcohol/week: 2.0 standard drinks of alcohol    Types: 1 Glasses of wine, 1 Standard drinks or equivalent per week    Comment: occassionally   Drug use: No   Sexual activity: Not on file  Other Topics Concern   Not on file  Social History Narrative   Pt lives in [place] with his wife.  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[employer]   Social Drivers of Health   Tobacco Use: Low Risk  (11/28/2024)   Received from [facility]   Patient History    Smoking Tobacco Use: Never    Smokeless Tobacco Use: Never    Passive Exposure: Not on file  Financial Resource Strain: Low Risk  (08/10/2023)   Received from [facility]   Overall Financial Resource Strain (CARDIA)    Difficulty of Paying Living Expenses: Not hard at all  Food Insecurity: No Food Insecurity (10/30/2024)   Epic    Worried About Programme Researcher, Broadcasting/film/video in the Last Year: Never true    Ran Out of Food in the Last Year: Never true  Transportation Needs: No Transportation Needs (10/30/2024)   Epic    Lack of Transportation (Medical): No    Lack of Transportation (Non-Medical): No  Physical Activity: Not on file  Stress: Not on file   Social Connections: Unknown (10/30/2024)   Social Connection and Isolation Panel    Frequency of Communication with Friends and Family: Patient declined    Frequency of Social Gatherings with Friends and Family: Patient declined    Attends Religious Services: Patient declined    Database Administrator or Organizations: Patient declined    Attends Banker Meetings: Patient declined    Marital Status: Married  Catering Manager Violence: Not At Risk (10/30/2024)   Epic    Fear of Current or Ex-Partner: No    Emotionally Abused: No    Physically Abused: No    Sexually Abused: No  Depression (PHQ2-9): Not on file  Alcohol Screen: Not on file  Housing: Low Risk  (11/28/2024)   Received from [facility]   Epic    In the last 12 months, was there a time when you were not able to pay the mortgage or rent on time?: No    In the past 12 months, how many times have you moved where you were living?: 0    At any time in the past 12 months, were you homeless or living in a shelter (including now)?: No  Utilities: Not At Risk (10/30/2024)   Epic    Threatened with loss of utilities: No  Health Literacy: Not on file      Family History  Problem Relation Age of Onset   Heart attack Neg Hx    Vitals:   12/01/24 1049  BP: (!) 142/69  Pulse: 60  SpO2: 98%  Weight: 192 lb 9.6 oz (87.4 kg)   Wt Readings from Last 3 Encounters:  12/01/24 192 lb 9.6 oz (87.4 kg)  11/10/24 190 lb 3.2 oz (86.3 kg)  11/06/24 186 lb 12.8 oz (84.7 kg)   Lab  Results  Component Value Date   CREATININE 1.04 10/31/2024   CREATININE 0.87 10/30/2024   CREATININE 1.05 10/29/2024    PHYSICAL EXAM:  General: Well appearing.  Cor: No JVD. Regular rhythm, rate.  Lungs: clear Abdomen: soft, nontender, nondistended. Extremities: 2+ pitting edema in bilateral lower legs with L>R Neuro:. Affect pleasant, focused on his current leg / hip pain   ECG: Not done   ASSESSMENT &  PLAN:  1: HFmrEF- - suspect due to AF - NYHA class II - euvolemic - weight up 2 pounds from last visit here 3 weeks ago. Home weight chart reviewed and weight ranges from 182-185 pounds recently - Echo 10/29/20: EF 55 to 60%, no RWMA, mild LVH, Grade I DD, normal RV size/function, normal PA pressure, mild LAE, mild Robert, mildly systolic prolapse of posterior leaflet of the mitral valve. - Echo 10/31/24: EF 45 to 50%, normal RV, mild Robert although overread by cardiology as 55-60% - continue furosemide  40mg  daily. Hesitant to increase this due to dizziness/ falls - encouraged compression socks and elevation when sitting for long periods of time - will check Mg today to make sure his leg cramps aren't related to this. Continue OTC magnesium  at this time  - not adding salt to his food. Reviewed keeping daily sodium content to 1500-2000mg  - BNP 11/27/24 was 443.0  2: HTN- - BP 142/69 - saw PCP Derinda) 11/25 - BMET 11/27/24 reviewed: sodium 134, potassium 4.4, creatinine 1.0, GFR 72  3: PAF- - saw cardiology Florestine) 08/25 - continue warfarin - continue flecainide  100mg  PRN for palpitations - INR 11/30/24 was 3.7 (managed by PCP)  4: HLD- - LDL 11/27/24 was 91   Return in 3-4 months, sooner if needed. Emphasized that he reach out to his PCP regarding his leg/ hip pain.   I spent 35 minutes reviewing records, interviewing/ examing patient and managing plan/ orders.   Ellouise DELENA Class, FNP-C 11/30/2024

## 2024-11-30 NOTE — Telephone Encounter (Signed)
 Called to confirm/remind patient of their appointment at the [facility] on 12/01/24.   Appointment:   [x] Confirmed  [] Left mess   [] No answer/No voice mail  [] VM Full/unable to leave message  [] Phone not in service  Patient reminded to bring all medications and/or complete list.  Confirmed patient has transportation. Gave directions, instructed to utilize valet parking.

## 2024-12-01 ENCOUNTER — Encounter: Payer: Self-pay | Admitting: Family

## 2024-12-01 ENCOUNTER — Ambulatory Visit: Attending: Family | Admitting: Family

## 2024-12-01 VITALS — BP 142/69 | HR 60 | Wt 192.6 lb

## 2024-12-01 DIAGNOSIS — I10 Essential (primary) hypertension: Secondary | ICD-10-CM | POA: Diagnosis not present

## 2024-12-01 DIAGNOSIS — I480 Paroxysmal atrial fibrillation: Secondary | ICD-10-CM | POA: Diagnosis not present

## 2024-12-01 DIAGNOSIS — E782 Mixed hyperlipidemia: Secondary | ICD-10-CM

## 2024-12-01 DIAGNOSIS — I5022 Chronic systolic (congestive) heart failure: Secondary | ICD-10-CM | POA: Diagnosis not present

## 2024-12-01 NOTE — Patient Instructions (Signed)
 Medication Changes:  No medication changes today!  Lab Work:   Go downstairs to [facility] on [address] to have your blood work completed.  We will only call you if the results are abnormal or if the provider would like to make medication changes.  No news is good news.'   Follow-Up in: Please follow up with the [facility] in 3-4 months with Ellouise Class, FNP.   Thank you for choosing [facility].    At the [facility], you and your health needs are our priority. We have a designated team specialized in the treatment of Heart Failure. This Care Team includes your primary Heart Failure Specialized Cardiologist (physician), Advanced Practice Providers (APPs- Physician Assistants and Nurse Practitioners), and Pharmacist who all work together to provide you with the care you need, when you need it.   You may see any of the following providers on your designated Care Team at your next follow up:  Dr. Toribio Fuel Dr. Ezra Shuck Dr. Ria Commander Dr. Morene Brownie Ellouise Class, FNP Jaun Bash, RPH-CPP  Please be sure to bring in all your medications bottles to every appointment.   Need to Contact Us :  If you have any questions or concerns before your next appointment please send us  a message through [place] or call our office at [phone].    TO LEAVE A MESSAGE FOR THE NURSE SELECT OPTION 2, PLEASE LEAVE A MESSAGE INCLUDING: YOUR NAME DATE OF BIRTH CALL BACK NUMBER REASON FOR CALL**this is important as we prioritize the call backs  YOU WILL RECEIVE A CALL BACK THE SAME DAY AS LONG AS YOU CALL BEFORE 4:00 PM

## 2024-12-02 LAB — MAGNESIUM: Magnesium: 2 mg/dL (ref 1.6–2.3)

## 2024-12-03 ENCOUNTER — Ambulatory Visit: Payer: Self-pay | Admitting: Family

## 2024-12-05 ENCOUNTER — Emergency Department

## 2024-12-05 ENCOUNTER — Inpatient Hospital Stay
Admission: EM | Admit: 2024-12-05 | Discharge: 2024-12-12 | DRG: 378 | Disposition: A | Discharge status: Skilled Nursing Facility | Attending: Obstetrics and Gynecology | Admitting: Obstetrics and Gynecology

## 2024-12-05 ENCOUNTER — Encounter: Payer: Self-pay | Admitting: Emergency Medicine

## 2024-12-05 ENCOUNTER — Other Ambulatory Visit: Payer: Self-pay

## 2024-12-05 DIAGNOSIS — K219 Gastro-esophageal reflux disease without esophagitis: Secondary | ICD-10-CM | POA: Diagnosis present

## 2024-12-05 DIAGNOSIS — F05 Delirium due to known physiological condition: Secondary | ICD-10-CM | POA: Diagnosis present

## 2024-12-05 DIAGNOSIS — I10 Essential (primary) hypertension: Secondary | ICD-10-CM | POA: Diagnosis present

## 2024-12-05 DIAGNOSIS — D509 Iron deficiency anemia, unspecified: Secondary | ICD-10-CM

## 2024-12-05 DIAGNOSIS — R55 Syncope and collapse: Secondary | ICD-10-CM | POA: Diagnosis present

## 2024-12-05 DIAGNOSIS — D649 Anemia, unspecified: Secondary | ICD-10-CM | POA: Diagnosis present

## 2024-12-05 DIAGNOSIS — D6832 Hemorrhagic disorder due to extrinsic circulating anticoagulants: Secondary | ICD-10-CM | POA: Diagnosis present

## 2024-12-05 DIAGNOSIS — Z882 Allergy status to sulfonamides status: Secondary | ICD-10-CM

## 2024-12-05 DIAGNOSIS — T45515A Adverse effect of anticoagulants, initial encounter: Secondary | ICD-10-CM | POA: Diagnosis present

## 2024-12-05 DIAGNOSIS — Z6831 Body mass index (BMI) 31.0-31.9, adult: Secondary | ICD-10-CM

## 2024-12-05 DIAGNOSIS — Z7901 Long term (current) use of anticoagulants: Secondary | ICD-10-CM

## 2024-12-05 DIAGNOSIS — K922 Gastrointestinal hemorrhage, unspecified: Principal | ICD-10-CM | POA: Insufficient documentation

## 2024-12-05 DIAGNOSIS — Z7983 Long term (current) use of bisphosphonates: Secondary | ICD-10-CM

## 2024-12-05 DIAGNOSIS — E785 Hyperlipidemia, unspecified: Secondary | ICD-10-CM | POA: Diagnosis present

## 2024-12-05 DIAGNOSIS — Z79899 Other long term (current) drug therapy: Secondary | ICD-10-CM

## 2024-12-05 DIAGNOSIS — D128 Benign neoplasm of rectum: Secondary | ICD-10-CM | POA: Diagnosis present

## 2024-12-05 DIAGNOSIS — E871 Hypo-osmolality and hyponatremia: Secondary | ICD-10-CM | POA: Diagnosis present

## 2024-12-05 DIAGNOSIS — K5521 Angiodysplasia of colon with hemorrhage: Secondary | ICD-10-CM | POA: Diagnosis present

## 2024-12-05 DIAGNOSIS — E782 Mixed hyperlipidemia: Secondary | ICD-10-CM | POA: Diagnosis not present

## 2024-12-05 DIAGNOSIS — I480 Paroxysmal atrial fibrillation: Secondary | ICD-10-CM | POA: Diagnosis not present

## 2024-12-05 DIAGNOSIS — D122 Benign neoplasm of ascending colon: Secondary | ICD-10-CM | POA: Diagnosis present

## 2024-12-05 DIAGNOSIS — K31811 Angiodysplasia of stomach and duodenum with bleeding: Principal | ICD-10-CM | POA: Diagnosis present

## 2024-12-05 DIAGNOSIS — Z96641 Presence of right artificial hip joint: Secondary | ICD-10-CM | POA: Diagnosis present

## 2024-12-05 DIAGNOSIS — I951 Orthostatic hypotension: Secondary | ICD-10-CM | POA: Diagnosis present

## 2024-12-05 DIAGNOSIS — N400 Enlarged prostate without lower urinary tract symptoms: Secondary | ICD-10-CM | POA: Diagnosis not present

## 2024-12-05 DIAGNOSIS — K5730 Diverticulosis of large intestine without perforation or abscess without bleeding: Secondary | ICD-10-CM | POA: Diagnosis present

## 2024-12-05 DIAGNOSIS — Z881 Allergy status to other antibiotic agents status: Secondary | ICD-10-CM

## 2024-12-05 DIAGNOSIS — D120 Benign neoplasm of cecum: Secondary | ICD-10-CM | POA: Diagnosis present

## 2024-12-05 DIAGNOSIS — E66811 Obesity, class 1: Secondary | ICD-10-CM | POA: Diagnosis present

## 2024-12-05 DIAGNOSIS — Z880 Allergy status to penicillin: Secondary | ICD-10-CM

## 2024-12-05 DIAGNOSIS — I110 Hypertensive heart disease with heart failure: Secondary | ICD-10-CM | POA: Diagnosis present

## 2024-12-05 DIAGNOSIS — R296 Repeated falls: Secondary | ICD-10-CM | POA: Diagnosis present

## 2024-12-05 DIAGNOSIS — D62 Acute posthemorrhagic anemia: Secondary | ICD-10-CM | POA: Diagnosis present

## 2024-12-05 DIAGNOSIS — Z888 Allergy status to other drugs, medicaments and biological substances status: Secondary | ICD-10-CM

## 2024-12-05 DIAGNOSIS — I428 Other cardiomyopathies: Secondary | ICD-10-CM | POA: Diagnosis present

## 2024-12-05 DIAGNOSIS — D125 Benign neoplasm of sigmoid colon: Secondary | ICD-10-CM | POA: Diagnosis present

## 2024-12-05 LAB — COMPREHENSIVE METABOLIC PANEL WITH GFR
ALT: 20 U/L (ref 0–44)
AST: 26 U/L (ref 15–41)
Albumin: 4 g/dL (ref 3.5–5.0)
Alkaline Phosphatase: 42 U/L (ref 38–126)
Anion gap: 10 (ref 5–15)
BUN: 23 mg/dL (ref 8–23)
CO2: 26 mmol/L (ref 22–32)
Calcium: 8.7 mg/dL — ABNORMAL LOW (ref 8.9–10.3)
Chloride: 94 mmol/L — ABNORMAL LOW (ref 98–111)
Creatinine, Ser: 1.04 mg/dL (ref 0.61–1.24)
GFR, Estimated: >60 mL/min (ref >60)
Glucose, Bld: 86 mg/dL (ref 70–99)
Potassium: 4.2 mmol/L (ref 3.5–5.1)
Sodium: 130 mmol/L — ABNORMAL LOW (ref 135–145)
Total Bilirubin: 1.1 mg/dL (ref 0.0–1.2)
Total Protein: 6.4 g/dL — ABNORMAL LOW (ref 6.5–8.1)

## 2024-12-05 LAB — CBC
HCT: 26 % — ABNORMAL LOW (ref 39.0–52.0)
HCT: 25.7 % — ABNORMAL LOW (ref 39.0–52.0)
Hemoglobin: 8.7 g/dL — ABNORMAL LOW (ref 13.0–17.0)
Hemoglobin: 8.7 g/dL — ABNORMAL LOW (ref 13.0–17.0)
MCH: 27.8 pg (ref 26.0–34.0)
MCH: 27.5 pg (ref 26.0–34.0)
MCHC: 33.5 g/dL (ref 30.0–36.0)
MCHC: 33.9 g/dL (ref 30.0–36.0)
MCV: 83.1 fL (ref 80.0–100.0)
MCV: 81.3 fL (ref 80.0–100.0)
Platelets: 279 K/uL (ref 150–400)
Platelets: 277 K/uL (ref 150–400)
RBC: 3.13 MIL/uL — ABNORMAL LOW (ref 4.22–5.81)
RBC: 3.16 MIL/uL — ABNORMAL LOW (ref 4.22–5.81)
RDW: 15.6 % — ABNORMAL HIGH (ref 11.5–15.5)
RDW: 15.4 % (ref 11.5–15.5)
WBC: 10.7 K/uL — ABNORMAL HIGH (ref 4.0–10.5)
WBC: 10.2 K/uL (ref 4.0–10.5)
nRBC: 0 % (ref 0.0–0.2)
nRBC: 0 % (ref 0.0–0.2)

## 2024-12-05 LAB — URINALYSIS, ROUTINE W REFLEX MICROSCOPIC
Bilirubin Urine: NEGATIVE
Glucose, UA: NEGATIVE mg/dL
Hgb urine dipstick: NEGATIVE
Ketones, ur: NEGATIVE mg/dL
Leukocytes,Ua: NEGATIVE
Nitrite: NEGATIVE
Protein, ur: NEGATIVE mg/dL
Specific Gravity, Urine: 1.005 (ref 1.005–1.030)
pH: 7 (ref 5.0–8.0)

## 2024-12-05 LAB — PROTIME-INR
INR: 3.6 — ABNORMAL HIGH (ref 0.8–1.2)
Prothrombin Time: 37.1 s — ABNORMAL HIGH (ref 11.4–15.2)

## 2024-12-05 LAB — TROPONIN T, HIGH SENSITIVITY: Troponin T High Sensitivity: 44 ng/L — ABNORMAL HIGH (ref 0–19)

## 2024-12-05 MED ORDER — ADULT MULTIVITAMIN W/MINERALS CH
1.0000 | ORAL_TABLET | Freq: Every day | ORAL | Status: DC
  Administered 2024-12-05 – 2024-12-12 (×5): 1 via ORAL
  Filled 2024-12-05 (×6): qty 1

## 2024-12-05 MED ORDER — FINASTERIDE 5 MG PO TABS
5.0000 mg | ORAL_TABLET | Freq: Every day | ORAL | Status: DC
  Administered 2024-12-06 – 2024-12-12 (×4): 5 mg via ORAL
  Filled 2024-12-05 (×6): qty 1

## 2024-12-05 MED ORDER — FERROUS SULFATE 325 (65 FE) MG PO TABS
325.0000 mg | ORAL_TABLET | Freq: Every day | ORAL | Status: DC
  Administered 2024-12-06 – 2024-12-12 (×4): 325 mg via ORAL
  Filled 2024-12-05 (×5): qty 1

## 2024-12-05 MED ORDER — ONDANSETRON HCL 4 MG/2ML IJ SOLN: 4.0000 mg | Freq: Four times a day (QID) | INTRAMUSCULAR | Status: DC | PRN

## 2024-12-05 MED ORDER — PANTOPRAZOLE SODIUM 40 MG IV SOLR
40.0000 mg | INTRAVENOUS | Status: AC
  Administered 2024-12-05 (×2): 40 mg via INTRAVENOUS
  Filled 2024-12-05 (×2): qty 10

## 2024-12-05 MED ORDER — ALUM & MAG HYDROXIDE-SIMETH 200-200-20 MG/5ML PO SUSP
30.0000 mL | Freq: Four times a day (QID) | ORAL | Status: DC | PRN
  Administered 2024-12-05 – 2024-12-06 (×2): 30 mL via ORAL
  Filled 2024-12-05 (×2): qty 30

## 2024-12-05 MED ORDER — TAMSULOSIN HCL 0.4 MG PO CAPS
0.4000 mg | ORAL_CAPSULE | Freq: Every day | ORAL | Status: DC
  Administered 2024-12-06 – 2024-12-12 (×4): 0.4 mg via ORAL
  Filled 2024-12-05 (×5): qty 1

## 2024-12-05 MED ORDER — MORPHINE SULFATE (PF) 2 MG/ML IV SOLN
2.0000 mg | INTRAVENOUS | Status: DC | PRN
  Administered 2024-12-06: 23:00:00 2 mg via INTRAVENOUS
  Filled 2024-12-05: qty 1

## 2024-12-05 MED ORDER — PHYTONADIONE 5 MG PO TABS
5.0000 mg | ORAL_TABLET | Freq: Once | ORAL | Status: AC
  Administered 2024-12-05: 16:00:00 5 mg via ORAL
  Filled 2024-12-05: qty 1

## 2024-12-05 MED ORDER — HYDRALAZINE HCL 20 MG/ML IJ SOLN: 10.0000 mg | Freq: Four times a day (QID) | INTRAMUSCULAR | Status: DC | PRN

## 2024-12-05 MED ORDER — ACETAMINOPHEN 325 MG PO TABS
650.0000 mg | ORAL_TABLET | Freq: Four times a day (QID) | ORAL | Status: DC | PRN
  Administered 2024-12-06: 15:00:00 650 mg via ORAL
  Filled 2024-12-05: qty 2

## 2024-12-05 MED ORDER — ONDANSETRON HCL 4 MG PO TABS: 4.0000 mg | ORAL_TABLET | Freq: Four times a day (QID) | ORAL | Status: DC | PRN

## 2024-12-05 MED ORDER — ACETAMINOPHEN 650 MG RE SUPP: 650.0000 mg | Freq: Four times a day (QID) | RECTAL | Status: DC | PRN

## 2024-12-05 MED ORDER — PANTOPRAZOLE SODIUM 40 MG IV SOLR
40.0000 mg | Freq: Two times a day (BID) | INTRAVENOUS | Status: DC
  Administered 2024-12-06 – 2024-12-10 (×10): 40 mg via INTRAVENOUS
  Filled 2024-12-05 (×10): qty 10

## 2024-12-05 NOTE — ED Notes (Signed)
 Patient assisted to use urinal at this time. Patient urinated and was cleaned up, brief repositioned. Patient denies other needs.

## 2024-12-05 NOTE — ED Triage Notes (Signed)
 Fell in [place]. Son states patient was lightheaded and fell backward hitting head.  Son reports patient also fell on 11/26/2024, patient unclear on situation surrounding prior fall. Son reports some new bruising to left inner thigh x 2-3 days.  Patient is AAOx3. Skin warm and dry. NAD

## 2024-12-05 NOTE — ED Provider Notes (Signed)
 [facility] Provider Note    Event Date/Time   First MD Initiated Contact with Patient 12/05/24 1116     (approximate)  History   Chief Complaint: Loss of Consciousness  HPI  Robert Burch is a 89 y.o. male with a past medical history of atrial fibrillation on Coumadin , gastric reflux, presents to the emergency department for increased falls.  According to the patient and son patient had a fall on 12/7, then again today.  Patient denies passing out, states he felt lightheaded and then fell.  Son is also noted some bruising to the left inner thigh over the last few days.  Patient is on Coumadin  and recently had his level decreased as he was started on prednisone and they were concerned that the INR could increase.  Patient here is awake alert denies any pain.  Does believe he hit the back of his head.  Physical Exam   Triage Vital Signs: ED Triage Vitals [12/05/24 1100]  Encounter Vitals Group     BP (!) 161/74     Girls Systolic BP Percentile      Girls Diastolic BP Percentile      Boys Systolic BP Percentile      Boys Diastolic BP Percentile      Pulse Rate 62     Resp 16     Temp 97.8 F (36.6 C)     Temp Source Oral     SpO2 98 %     Weight 192 lb 7.4 oz (87.3 kg)     Height      Head Circumference      Peak Flow      Pain Score 0     Pain Loc      Pain Education      Exclude from Growth Chart     Most recent vital signs: Vitals:   12/05/24 1100  BP: (!) 161/74  Pulse: 62  Resp: 16  Temp: 97.8 F (36.6 C)  SpO2: 98%    General: Awake, no distress.  No obvious signs of head trauma.  No cervical spine tenderness. CV:  Good peripheral perfusion.  Regular rate and rhythm  Resp:  Normal effort.  Equal breath sounds bilaterally.  Abd:  No distention.  Soft, nontender Other:  Patient does have older appearing ecchymosis to the left inner thigh consistent with likely resolving hematoma.  Area is soft to palpation no concern for  large hematoma or compartment.   ED Results / Procedures / Treatments   EKG  EKG viewed and interpreted by myself shows atrial fibrillation at 72 bpm with a narrow QRS, normal axis, normal intervals, no concerning ST changes.  RADIOLOGY  I have reviewed and interpreted the CT head images.  No obvious bleed seen on my evaluation. Radiology is read the CT scan as negative for acute abnormality. CT cervical spine is negative. Pelvis x-ray negative.  MEDICATIONS ORDERED IN ED: Medications - No data to display   IMPRESSION / MDM / ASSESSMENT AND PLAN / ED COURSE  I reviewed the triage vital signs and the nursing notes.  Patient's presentation is most consistent with acute presentation with potential threat to life or bodily function.  Patient presents emergency department for 2 falls over the past 1 week hitting his head and being on Coumadin .  Here the patient is awake alert no distress.  Patient's lab work today shows a nearly 3 point drop in hemoglobin over the past 1 month, reassuring chemistry, normal  urinalysis without sign of infection.  Patient's INR has resulted elevated at 3.6.  Troponin slightly elevated at 44.  CT scan of the head is negative, CT scan of the cervical spine is negative.  Pelvis x-ray is negative.  Given the patient's 3 point hemoglobin drop I performed a rectal examination that showed dark brown stool guaiac positive.  Strongly suspect GI bleed to be the cause for the patient's anemia now symptomatic anemia possibly leading to the patient's increased falls.  Given the patient's elevated INR and guaiac positive stool with 3 point hemoglobin loss in 1 month will admit to the hospital service for further workup and treatment.  Patient and family agreeable to plan.  We will place the patient on IV Protonix .  FINAL CLINICAL IMPRESSION(S) / ED DIAGNOSES   GI bleed Symptomatic anemia Falls   Note:  This document was prepared using Dragon voice recognition software  and may include unintentional dictation errors.   Dorothyann Drivers, MD 12/05/24 1310

## 2024-12-05 NOTE — ED Notes (Signed)
 Patient had an episode of urinary incontinence. Patient was cleaned up and peri care was provided. Patient was placed in a clean brief and a paper pad was placed under the patient. Patient's bed was placed in lowest position with call light in reach. Patient denies other needs at this time.

## 2024-12-05 NOTE — ED Notes (Signed)
 This RN notified via orders to do orthostatic vital signs. Patient unable to stand for orthostatics at this time due to unsteady gait. This RN would like patient to be seen by PT before ambulating patient out of bed.

## 2024-12-05 NOTE — ED Notes (Signed)
 CCMD called to admit patient to cardiac monitor.

## 2024-12-05 NOTE — H&P (Signed)
 History and Physical    Patient: Robert Burch FMW:[id number] DOB: 03-24-1933 DOA: 12/05/2024 DOS: the patient was seen and examined on 12/05/2024 PCP: Glover Lenis, MD  Patient coming from: Home  Chief Complaint:  Chief Complaint  Patient presents with   Loss of Consciousness   HPI: Robert Burch is a 89 y.o. male with medical history significant of A-fib on Coumadin , nonischemic cardiomyopathy, chronic anxiety, IBS, hypertension, GERD presented to the emergency department for evaluation of lightheadedness, fall.  Patient states that he had fallen 10 days ago, does have bilateral hip and knee pain, bruise over his thigh.  Today patient reported dizziness, lightheadedness before fall, fell front side, hit his head.  Denies any chest pain or shortness of breath.  No recent fever, chills or rigors.  Patient takes Coumadin  for A-fib.  No abdominal pain, nausea or vomiting.  Patient has been taking Tylenol , tramadol  for pain.  In the emergency department, patient is hemodynamically stable, laboratory workup showed INR 3.6, hemoglobin 8.7 dropped from 11.1 a month ago.  Sodium 130.  CT head unremarkable, CT cervical spine negative.  Pelvic x-ray negative.  EKG showed A-fib but heart rate 72 no ST-T wave changes.  ED provider performed shoulder exam which is dark brown, guaiac positive requested hospitalist admission for further management evaluation of symptomatic anemia, possible GI bleed in the setting of use of anticoagulation  Review of Systems: As mentioned in the history of present illness. All other systems reviewed and are negative. Past Medical History:  Diagnosis Date   A-fib [facility])    Atypical chest pain    a. Nonobstructive, very minor irregs by cath 2007;  b. 06/2009 Ex MV: small area of apical lateral ischemia;  c. 06/2009 Cath: essentially normal;  d. 04/2014 Lexiscan  MV: EF 59%, no ischemia/infarct->low risk.   Cancer (HCC)    Chronic anticoagulation    a.  Chronic Coumadin    Chronic anxiety    Diverticulosis    Dysrhythmia    atrial fib   GERD (gastroesophageal reflux disease)    Hypertension    IBS (irritable bowel syndrome)    Nonischemic cardiomyopathy (HCC)    a. EF 45% by echo 2010;  b. 05/2012 Echo: EF 55-65%, no rwma, Gr 1 DD, mild AI/MR, mildly dil LA.   Paralyzed vocal cords    Paroxysmal atrial fibrillation (HCC)    a. Treated with flecainide    Proteinuria    History of   Past Surgical History:  Procedure Laterality Date   CARDIAC CATHETERIZATION     cataract surgery Bilateral    HIP ARTHROPLASTY Right 03/17/2020   Procedure: ARTHROPLASTY BIPOLAR HIP (HEMIARTHROPLASTY);  Surgeon: [name], MD;  Location: [facility] ORS;  Service: Orthopedics;  Laterality: Right;   MASS BIOPSY  2013   Social History:  reports that he has never smoked. He has never used smokeless tobacco. He reports that he does not drink alcohol and does not use drugs.  Allergies[1]  Family History  Problem Relation Age of Onset   Heart attack Neg Hx     Prior to Admission medications  Medication Sig Start Date End Date Taking? Authorizing Provider  acetaminophen  (TYLENOL ) 325 MG tablet Take 650 mg by mouth every 6 (six) hours as needed.    [provider]  acidophilus (RISAQUAD) CAPS capsule Take 1 capsule by mouth daily.    [provider]  alendronate  (FOSAMAX ) 70 MG tablet Take 1 tablet (70 mg total) by mouth once a week. 11/16/23   Caro,  Jessica K, NP  alum & mag hydroxide-simeth (MAALOX PLUS) 400-400-40 MG/5ML suspension Take 30 mLs by mouth every 4 (four) hours as needed for indigestion.    [provider]  ascorbic acid (VITAMIN C) 500 MG tablet Take 500 mg by mouth daily.    [provider]  Benzocaine-Menthol  (ORAJEL 2X TOOTHACHE & GUM) 20-0.26 % GEL Use as directed in the mouth or throat every 6 (six) hours as needed.    [provider]  bisacodyl  (DULCOLAX) 5 MG EC tablet Take 1 tablet (5 mg  total) by mouth daily as needed for moderate constipation. 10/05/23   Josette Ade, MD  calcium -vitamin D (OSCAL WITH D) 500-5 MG-MCG tablet Take 1 tablet by mouth daily. 10/06/23   Josette Ade, MD  Cholecalciferol  (VITAMIN D3) 400 UNITS CAPS Take 800 Units by mouth daily.     [provider]  clotrimazole-betamethasone (LOTRISONE) cream Apply 1 Application topically as needed. 09/20/23   [provider]  cyanocobalamin (VITAMIN B12) 1000 MCG tablet Take 1,000 mcg by mouth daily.    [provider]  diclofenac (VOLTAREN) 50 MG EC tablet Take 50 mg by mouth as needed.    [provider]  finasteride  (PROSCAR ) 5 MG tablet Take 1 tablet (5 mg total) by mouth daily. Patient not taking: Reported on 12/01/2024 01/06/24   Francisca Redell BROCKS, MD  Flaxseed, Linseed, (FLAX SEED OIL) 1000 MG CAPS Take 1,000 mg by mouth daily.     [provider]  flecainide  (TAMBOCOR ) 100 MG tablet Take 1 tablet (100 mg total) by mouth every 12 (twelve) hours as needed (atrial fibrillation or palpitations). 11/16/23   Caro Harlene POUR, NP  furosemide  (LASIX ) 40 MG tablet Take 1 tablet (40 mg total) by mouth daily. 11/10/24   Donette Ellouise LABOR, FNP  hydrocortisone (ANUSOL-HC) 25 MG suppository Place 25 mg rectally daily as needed for hemorrhoids or anal itching.    [provider]  lidocaine  4 % Place 1 patch onto the skin. As needed    [provider]  magnesium  oxide (MAG-OX) 400 MG tablet Take 1 tablet (400 mg total) by mouth 2 (two) times daily. 08/15/24   [name], Timothy J, MD  Multiple Vitamin (MULTIVITAMIN) tablet Take 1 tablet by mouth daily.      [provider]  Omega-3 Fatty Acids (FISH OIL) 1000 MG CAPS Take 1,000 mg by mouth daily.     [provider]  pantoprazole  (PROTONIX ) 20 MG tablet Take 20 mg by mouth daily. 07/31/24   [provider]  polyethylene glycol (MIRALAX  / GLYCOLAX ) packet Take 17 g by mouth daily as needed  for mild constipation or moderate constipation.    [provider]  tamsulosin  (FLOMAX ) 0.4 MG CAPS capsule One capsule po at bedtime (start on 10/07/23) 08/15/24   [name], Timothy J, MD  warfarin (COUMADIN ) 2.5 MG tablet Take 2.5 mg by mouth daily. 11/17/23   [provider]    Physical Exam: Vitals:   12/05/24 1100  BP: (!) 161/74  Pulse: 62  Resp: 16  Temp: 97.8 F (36.6 C)  TempSrc: Oral  SpO2: 98%  Weight: 87.3 kg   General - Elderly Caucasian obese male, distress due to pain HEENT - PERRLA, EOMI, atraumatic head, non tender sinuses. Lung - Clear, basal rales, no rhonchi, wheezes. Heart - S1, S2 heard, no murmurs, rubs, trace pedal edema. Abdomen - Soft, bilateral hip tender, bowel sounds good Neuro - Alert, awake and oriented x 3, non focal exam.  Skin - Warm and dry.  Left thigh large bruise noted  Data Reviewed:     Latest Ref Rng & Units 12/05/2024    5:35 PM 12/05/2024   11:08 AM 10/30/2024    3:35 AM  CBC  WBC 4.0 - 10.5 K/uL 10.2  10.7  6.7   Hemoglobin 13.0 - 17.0 g/dL 8.7  8.7  88.8   Hematocrit 39.0 - 52.0 % 25.7  26.0  32.9   Platelets 150 - 400 K/uL 277  279  276        Latest Ref Rng & Units 12/05/2024   11:08 AM 10/31/2024   11:21 AM 10/30/2024    3:35 AM  BMP  Glucose 70 - 99 mg/dL 86  99  90   BUN 8 - 23 mg/dL 23  20  17    Creatinine 0.61 - 1.24 mg/dL 8.95  8.95  9.12   Sodium 135 - 145 mmol/L 130  136  136   Potassium 3.5 - 5.1 mmol/L 4.2  4.1  3.8   Chloride 98 - 111 mmol/L 94  99  102   CO2 22 - 32 mmol/L 26  28  24    Calcium  8.9 - 10.3 mg/dL 8.7  8.6  8.2    DG Pelvis 1-2 Views Result Date: 12/05/2024 EXAM: 1 or 2 VIEW(S) XRAY OF THE PELVIS 12/05/2024 12:16:00 PM COMPARISON: Comparison with 03/17/2020. CLINICAL HISTORY: Hip pain after a fall. FINDINGS: BONES AND JOINTS: Postoperative changes with right hip hemiarthroplasty. The femoral stem is not completely included within the field of view. The visualized portion of the  arthroplasty appears well seated. No acute fracture or dislocation. The pelvis appears intact. SI joints and symphysis pubis are not displaced. Degenerative changes in the lower lumbar spine. SOFT TISSUES: The soft tissues are unremarkable. IMPRESSION: 1. No acute fracture or dislocation. 2. Right hip hemiarthroplasty with the visualized portion appearing well seated. Electronically signed by: Elsie Gravely MD 12/05/2024 12:34 PM EST RP Workstation: [id number]   CT Cervical Spine Wo Contrast Result Date: 12/05/2024 EXAM: CT CERVICAL SPINE WITHOUT CONTRAST 12/05/2024 12:06:56 PM TECHNIQUE: CT of the cervical spine was performed without the administration of intravenous contrast. Multiplanar reformatted images are provided for review. Automated exposure control, iterative reconstruction, and/or weight based adjustment of the mA/kV was utilized to reduce the radiation dose to as low as reasonably achievable. COMPARISON: CT of the cervical spine dated 03/16/2020. CLINICAL HISTORY: Neck trauma (Age >= 65y). FINDINGS: BONES AND ALIGNMENT: There is no evidence of acute traumatic injury. There is slight degenerative anterolisthesis at C5-C6. There is a mild levoscoliotic curvature of the cervical spine. The left facets at C2-C3 are fused and the right facets at C3-C4 are fused. DEGENERATIVE CHANGES: There is moderate bilateral facet arthrosis. SOFT TISSUES: No prevertebral soft tissue swelling. IMPRESSION: 1. No evidence of acute traumatic injury. 2. 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 3. Slight degenerative event of listhesis at C5-6. 4. Fused left facets at C2-3 and right facets at C3-4. 5. Moderate bilateral facet arthrosis. Electronically signed by: Evalene Coho MD 12/05/2024 12:13 PM EST RP Workstation: [id number]   CT HEAD WO CONTRAST Result Date: 12/05/2024 EXAM: CT HEAD WITHOUT CONTRAST 12/05/2024 12:06:56 PM TECHNIQUE: CT of the head was performed without the administration of  intravenous contrast. Automated exposure control, iterative reconstruction, and/or weight based adjustment of the mA/kV was utilized to reduce the radiation dose to as low as reasonably achievable. COMPARISON: CT of the head dated 03/16/2020. CLINICAL HISTORY: Head trauma,  minor (Age >= 65y). FINDINGS: BRAIN AND VENTRICLES: No acute hemorrhage. No evidence of acute infarct. No extra-axial collection. No mass effect or midline shift. Patchy and confluent areas of decreased attenuation throughout the deep and periventricular white matter of the cerebral hemispheres bilaterally, compatible with chronic microvascular ischemic disease. Cerebral ventricle sizes concordant with the degree of cerebral volume loss. Atherosclerotic calcifications within the cavernous internal carotid and vertebral arteries. ORBITS: No acute abnormality. Bilateral lens replacement. SINUSES: No acute abnormality. Left mastoid air cell effusion. SOFT TISSUES AND SKULL: No acute soft tissue abnormality. No skull fracture. IMPRESSION: 1. No acute intracranial abnormality related to the head trauma. 2. Chronic microvascular ischemic disease in the deep and periventricular white matter of the cerebral hemispheres bilaterally. 3. Cerebral volume loss with ventricle sizes concordant with the degree of volume loss. 4. Atherosclerotic calcifications within the cavernous internal carotid and vertebral arteries. 5. Left mastoid air cell effusion. Electronically signed by: Evalene Coho MD 12/05/2024 12:10 PM EST RP Workstation: [id number]     Assessment and Plan: Robert Burch is a 89 y.o. male with medical history significant of A-fib on Coumadin , nonischemic cardiomyopathy, chronic anxiety, IBS, hypertension, GERD presented to the emergency department for evaluation of lightheadedness, fall.  Patient was noted to have drop in hemoglobin from baseline around 11-12 to 8.7.  Stool guaiac positive.  Patient is currently being admitted for  symptomatic anemia, GI evaluation.  Plan: Symptomatic anemia Acute blood loss anemia  Possible GI bleed-upper in the setting of anticoagulation. Presented with possible presyncope, fall. Admit the patient to medicine, telemetry under observation service. Continue to monitor H&H closely. GI evaluation called. IV Protonix  twice daily ordered. Hold Coumadin  for now.  INR 3.6, a dose of vitamin K  is given. Continue to monitor repeat INR. Recent anemia profile reviewed, iron lower side, supplement oral iron. Check orthostatic vitals. Fall precautions.  PT OT evaluation.  Paroxysmal atrial fibrillation- Rate controlled.  Continue to monitor telemetry. Hold Coumadin  due to GI bleed.  GERD: Continue IV Protonix .  Hypertension: Will resume home antihypertensive regimen once med rec completed. IV hydralazine  as needed ordered.  Nonischemic cardiomyopathy- Echo from 10/31/2024 EF 45 to 50%.  Hold Lasix  at this time.  Watch for fluid overload.  Hyponatremia Possibly due to dehydration. Hold Lasix , trend sodium.  Obesity class I: BMI 31.06. Diet, exercise and weight reduction advised   Advance Care Planning:   Code Status: Full Code discussed with patient, family at bedside  Consults: GI  Family Communication: Discussed with patient's wife, son at bedside.  They understand agree with the current plan  Severity of Illness: The appropriate patient status for this patient is OBSERVATION. Observation status is judged to be reasonable and necessary in order to provide the required intensity of service to ensure the patient's safety. The patient's presenting symptoms, physical exam findings, and initial radiographic and laboratory data in the context of their medical condition is felt to place them at decreased risk for further clinical deterioration. Furthermore, it is anticipated that the patient will be medically stable for discharge from the hospital within 2 midnights of admission.    Author: Concepcion Riser, MD 12/05/2024 3:03 PM  For on call review [email or web address].      [1]  Allergies Allergen Reactions   Ciprofloxacin     Diarrhea and stomach pains   Lanoxin [Digoxin]    Lopressor [Metoprolol Tartrate]    Penicillins     Other reaction(s): Unknown   Sulfonamide Derivatives     Rash  Verapamil    Sulfa Antibiotics Rash

## 2024-12-05 NOTE — Consult Note (Signed)
 Robert Copping, MD [facility]  [address] [place], [place] [address] Phone: [phone] Fax : [phone]  Consultation  Referring Provider:     Dr. Darci Primary Care Physician:  Glover Lenis, MD Primary Gastroenterologist:  Dr. Aundria         Reason for Consultation:     Anemia  Date of Admission:  12/05/2024 Date of Consultation:  12/05/2024         HPI:   Robert Burch is a 89 y.o. male who has a history of A-fib on Coumadin  with a history of GERD, recently put on prednisone for pain.  The patient has reported no black stools or bloody stools.  He had been having increased episodes of falling and he fell again on the seventh of this month then again today.  There was some report of some bruising on his left inner thigh over the last few days.  The patient's Coumadin  had been adjusted due to the prednisone and he has been taking lower dose.  Despite this the patient's INR has gone up and his most recent INR is have shown:  Component     Latest Ref Rng 10/29/2024 10/30/2024 10/31/2024 12/05/2024  Prothrombin Time     11.4 - 15.2 seconds 29.9 (H)  31.4 (H)  28.2 (H)  37.1 (H)   INR     0.8 - 1.2  2.7 (H)  2.9 (H)  2.5 (H)  3.6 (H)    He reports that his last dose of Coumadin  was yesterday.  The patient's liver enzymes were noted to be normal.  He reports that he had a colonoscopy in the past but was told that his colon was too torturous to go through and he has not had a colonoscopy or upper endoscopy in many years. The patient has been seen by Dr. Gaylyn, Dr. Ora, Dr. Aundria and a variety of APP's at that practice for a history of Barrett's esophagus GERD diverticulitis nausea and generalized abdominal pain.  He denies any symptoms at the present time with his GI tract.  He denies any unexplained weight loss or change in bowel habits.  Past Medical History:  Diagnosis Date   A-fib [facility])    Atypical chest pain    a. Nonobstructive, very minor irregs by  cath 2007;  b. 06/2009 Ex MV: small area of apical lateral ischemia;  c. 06/2009 Cath: essentially normal;  d. 04/2014 Lexiscan  MV: EF 59%, no ischemia/infarct->low risk.   Cancer (HCC)    Chronic anticoagulation    a. Chronic Coumadin    Chronic anxiety    Diverticulosis    Dysrhythmia    atrial fib   GERD (gastroesophageal reflux disease)    Hypertension    IBS (irritable bowel syndrome)    Nonischemic cardiomyopathy (HCC)    a. EF 45% by echo 2010;  b. 05/2012 Echo: EF 55-65%, no rwma, Gr 1 DD, mild AI/MR, mildly dil LA.   Paralyzed vocal cords    Paroxysmal atrial fibrillation (HCC)    a. Treated with flecainide    Proteinuria    History of    Past Surgical History:  Procedure Laterality Date   CARDIAC CATHETERIZATION     cataract surgery Bilateral    HIP ARTHROPLASTY Right 03/17/2020   Procedure: ARTHROPLASTY BIPOLAR HIP (HEMIARTHROPLASTY);  Surgeon: [name], MD;  Location: [facility] ORS;  Service: Orthopedics;  Laterality: Right;   MASS BIOPSY  2013    Prior to Admission medications  Medication Sig  Start Date End Date Taking? Authorizing Provider  acetaminophen  (TYLENOL ) 325 MG tablet Take 650 mg by mouth every 6 (six) hours as needed.    [provider]  acidophilus (RISAQUAD) CAPS capsule Take 1 capsule by mouth daily.    [provider]  alendronate  (FOSAMAX ) 70 MG tablet Take 1 tablet (70 mg total) by mouth once a week. 11/16/23   Caro Harlene POUR, NP  alum & mag hydroxide-simeth (MAALOX PLUS) 400-400-40 MG/5ML suspension Take 30 mLs by mouth every 4 (four) hours as needed for indigestion.    [provider]  ascorbic acid (VITAMIN C) 500 MG tablet Take 500 mg by mouth daily.    [provider]  Benzocaine-Menthol  (ORAJEL 2X TOOTHACHE & GUM) 20-0.26 % GEL Use as directed in the mouth or throat every 6 (six) hours as needed.    [provider]  bisacodyl  (DULCOLAX) 5 MG EC tablet Take 1 tablet (5 mg total) by mouth daily as needed  for moderate constipation. 10/05/23   Josette Ade, MD  calcium -vitamin D (OSCAL WITH D) 500-5 MG-MCG tablet Take 1 tablet by mouth daily. 10/06/23   Josette Ade, MD  Cholecalciferol  (VITAMIN D3) 400 UNITS CAPS Take 800 Units by mouth daily.     [provider]  clotrimazole-betamethasone (LOTRISONE) cream Apply 1 Application topically as needed. 09/20/23   [provider]  cyanocobalamin (VITAMIN B12) 1000 MCG tablet Take 1,000 mcg by mouth daily.    [provider]  diclofenac (VOLTAREN) 50 MG EC tablet Take 50 mg by mouth as needed.    [provider]  finasteride  (PROSCAR ) 5 MG tablet Take 1 tablet (5 mg total) by mouth daily. Patient not taking: Reported on 12/01/2024 01/06/24   Francisca Redell BROCKS, MD  Flaxseed, Linseed, (FLAX SEED OIL) 1000 MG CAPS Take 1,000 mg by mouth daily.     [provider]  flecainide  (TAMBOCOR ) 100 MG tablet Take 1 tablet (100 mg total) by mouth every 12 (twelve) hours as needed (atrial fibrillation or palpitations). 11/16/23   Caro Harlene POUR, NP  furosemide  (LASIX ) 40 MG tablet Take 1 tablet (40 mg total) by mouth daily. 11/10/24   Donette Ellouise LABOR, FNP  hydrocortisone (ANUSOL-HC) 25 MG suppository Place 25 mg rectally daily as needed for hemorrhoids or anal itching.    [provider]  lidocaine  4 % Place 1 patch onto the skin. As needed    [provider]  magnesium  oxide (MAG-OX) 400 MG tablet Take 1 tablet (400 mg total) by mouth 2 (two) times daily. 08/15/24   [name], Timothy J, MD  Multiple Vitamin (MULTIVITAMIN) tablet Take 1 tablet by mouth daily.      [provider]  Omega-3 Fatty Acids (FISH OIL) 1000 MG CAPS Take 1,000 mg by mouth daily.     [provider]  pantoprazole  (PROTONIX ) 20 MG tablet Take 20 mg by mouth daily. 07/31/24   [provider]  polyethylene glycol (MIRALAX  / GLYCOLAX ) packet Take 17 g by mouth daily as needed for mild constipation or  moderate constipation.    [provider]  tamsulosin  (FLOMAX ) 0.4 MG CAPS capsule One capsule po at bedtime (start on 10/07/23) 08/15/24   [name], Timothy J, MD  warfarin (COUMADIN ) 2.5 MG tablet Take 2.5 mg by mouth daily. 11/17/23   [provider]    Family History  Problem Relation Age of Onset   Heart attack Neg Hx      Social History[1]  Allergies as of  12/05/2024 - Review Complete 12/05/2024  Allergen Reaction Noted   Ciprofloxacin  10/12/2018   Lanoxin [digoxin]  03/07/2013   Lopressor [metoprolol tartrate]  03/07/2013   Penicillins  04/08/2015   Sulfonamide derivatives     Verapamil  03/07/2013   Sulfa antibiotics Rash 03/16/2014    Review of Systems:    All systems reviewed and negative except where noted in HPI.   Physical Exam:  Vital signs in last 24 hours: Temp:  [97.8 F (36.6 C)] 97.8 F (36.6 C) (12/16 1100) Pulse Rate:  [62] 62 (12/16 1100) Resp:  [16] 16 (12/16 1100) BP: (161)/(74) 161/74 (12/16 1100) SpO2:  [98 %] 98 % (12/16 1100) Weight:  [87.3 kg] 87.3 kg (12/16 1100)   General:   Pleasant, cooperative in NAD Head:  Normocephalic and atraumatic. Eyes:   No icterus.   Conjunctiva pink. PERRLA. Ears:  Normal auditory acuity. Neck:  Supple; no masses or thyroidomegaly Lungs: Respirations even and unlabored. Lungs clear to auscultation bilaterally.   No wheezes, crackles, or rhonchi.  Heart:  Regular rate and rhythm;  Without murmur, clicks, rubs or gallops Abdomen:  Soft, nondistended, nontender. Normal bowel sounds. No appreciable masses or hepatomegaly.  No rebound or guarding.  Rectal:  Not performed. Msk:  Symmetrical without gross deformities.    Extremities:  Without edema, cyanosis or clubbing. Neurologic:  Alert and oriented x3;  grossly normal neurologically. Skin:  Intact without significant lesions or rashes. Cervical Nodes:  No significant cervical adenopathy. Psych:  Alert and cooperative. Normal affect.  LAB  RESULTS: Recent Labs    12/05/24 1108  WBC 10.7*  HGB 8.7*  HCT 26.0*  PLT 279   BMET Recent Labs    12/05/24 1108  NA 130*  K 4.2  CL 94*  CO2 26  GLUCOSE 86  BUN 23  CREATININE 1.04  CALCIUM  8.7*   LFT Recent Labs    12/05/24 1108  PROT 6.4*  ALBUMIN 4.0  AST 26  ALT 20  ALKPHOS 42  BILITOT 1.1   PT/INR Recent Labs    12/05/24 1108  LABPROT 37.1*  INR 3.6*    STUDIES: DG Pelvis 1-2 Views Result Date: 12/05/2024 EXAM: 1 or 2 VIEW(S) XRAY OF THE PELVIS 12/05/2024 12:16:00 PM COMPARISON: Comparison with 03/17/2020. CLINICAL HISTORY: Hip pain after a fall. FINDINGS: BONES AND JOINTS: Postoperative changes with right hip hemiarthroplasty. The femoral stem is not completely included within the field of view. The visualized portion of the arthroplasty appears well seated. No acute fracture or dislocation. The pelvis appears intact. SI joints and symphysis pubis are not displaced. Degenerative changes in the lower lumbar spine. SOFT TISSUES: The soft tissues are unremarkable. IMPRESSION: 1. No acute fracture or dislocation. 2. Right hip hemiarthroplasty with the visualized portion appearing well seated. Electronically signed by: Elsie Gravely MD 12/05/2024 12:34 PM EST RP Workstation: [id number]   CT Cervical Spine Wo Contrast Result Date: 12/05/2024 EXAM: CT CERVICAL SPINE WITHOUT CONTRAST 12/05/2024 12:06:56 PM TECHNIQUE: CT of the cervical spine was performed without the administration of intravenous contrast. Multiplanar reformatted images are provided for review. Automated exposure control, iterative reconstruction, and/or weight based adjustment of the mA/kV was utilized to reduce the radiation dose to as low as reasonably achievable. COMPARISON: CT of the cervical spine dated 03/16/2020. CLINICAL HISTORY: Neck trauma (Age >= 65y). FINDINGS: BONES AND ALIGNMENT: There is no evidence of acute traumatic injury. There is slight degenerative anterolisthesis at C5-C6.  There is a mild levoscoliotic curvature of the cervical  spine. The left facets at C2-C3 are fused and the right facets at C3-C4 are fused. DEGENERATIVE CHANGES: There is moderate bilateral facet arthrosis. SOFT TISSUES: No prevertebral soft tissue swelling. IMPRESSION: 1. No evidence of acute traumatic injury. 2. 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 3. Slight degenerative event of listhesis at C5-6. 4. Fused left facets at C2-3 and right facets at C3-4. 5. Moderate bilateral facet arthrosis. Electronically signed by: Evalene Coho MD 12/05/2024 12:13 PM EST RP Workstation: [id number]   CT HEAD WO CONTRAST Result Date: 12/05/2024 EXAM: CT HEAD WITHOUT CONTRAST 12/05/2024 12:06:56 PM TECHNIQUE: CT of the head was performed without the administration of intravenous contrast. Automated exposure control, iterative reconstruction, and/or weight based adjustment of the mA/kV was utilized to reduce the radiation dose to as low as reasonably achievable. COMPARISON: CT of the head dated 03/16/2020. CLINICAL HISTORY: Head trauma, minor (Age >= 65y). FINDINGS: BRAIN AND VENTRICLES: No acute hemorrhage. No evidence of acute infarct. No extra-axial collection. No mass effect or midline shift. Patchy and confluent areas of decreased attenuation throughout the deep and periventricular white matter of the cerebral hemispheres bilaterally, compatible with chronic microvascular ischemic disease. Cerebral ventricle sizes concordant with the degree of cerebral volume loss. Atherosclerotic calcifications within the cavernous internal carotid and vertebral arteries. ORBITS: No acute abnormality. Bilateral lens replacement. SINUSES: No acute abnormality. Left mastoid air cell effusion. SOFT TISSUES AND SKULL: No acute soft tissue abnormality. No skull fracture. IMPRESSION: 1. No acute intracranial abnormality related to the head trauma. 2. Chronic microvascular ischemic disease in the deep and periventricular white  matter of the cerebral hemispheres bilaterally. 3. Cerebral volume loss with ventricle sizes concordant with the degree of volume loss. 4. Atherosclerotic calcifications within the cavernous internal carotid and vertebral arteries. 5. Left mastoid air cell effusion. Electronically signed by: Evalene Coho MD 12/05/2024 12:10 PM EST RP Workstation: [id number]      Impression / Plan:   Assessment: Principal Problem:   Symptomatic anemia   Zykee Avakian is a 89 y.o. y/o male with a drop in hemoglobin and hematocrit with a supratherapeutic INR while on Coumadin .  The patient denies any sign of any overt bleeding.  Plan:  I have discussed the options with the family including whether or not we proceed with luminal evaluation.  I have told them that we would need the INR to come down to at least 1.5 before any elective procedure was undertaken.  If there was any acute drop in hemoglobin and something needed to be done sooner we have discussed the option of giving him FFP and vitamin K  with a more urgent luminal evaluation. At this point I would wait for the INR to come down and trend his hemoglobin hematocrit to see if it is increasing or decreasing.  The patient and his family have been explained the plan and agree with it.  PPI IV twice daily  Continue serial CBCs and transfuse PRN Avoid NSAIDs Maintain 2 large-bore IV lines Please page GI with any acute hemodynamic changes, or signs of active GI bleeding   Thank you for involving me in the care of this patient.      LOS: 0 days   Robert Copping, MD, MD. NOLIA 12/05/2024, 3:46 PM,  Pager [phone] 7am-5pm  Check AMION for 5pm -7am coverage and on weekends   Note: This dictation was prepared with Dragon dictation along with smaller phrase technology. Any transcriptional errors that result from this process are unintentional.       [  1]  Social History Tobacco Use   Smoking status: Never   Smokeless tobacco: Never   Vaping Use   Vaping status: Never Used  Substance Use Topics   Alcohol use: No    Alcohol/week: 2.0 standard drinks of alcohol    Types: 1 Glasses of wine, 1 Standard drinks or equivalent per week    Comment: occassionally   Drug use: No

## 2024-12-05 NOTE — ED Notes (Signed)
 Fall bundle put into place.

## 2024-12-06 ENCOUNTER — Encounter: Payer: Self-pay | Admitting: Internal Medicine

## 2024-12-06 DIAGNOSIS — D649 Anemia, unspecified: Secondary | ICD-10-CM | POA: Diagnosis not present

## 2024-12-06 DIAGNOSIS — D509 Iron deficiency anemia, unspecified: Secondary | ICD-10-CM | POA: Diagnosis not present

## 2024-12-06 LAB — CBC
HCT: 26.2 % — ABNORMAL LOW (ref 39.0–52.0)
HCT: 25.8 % — ABNORMAL LOW (ref 39.0–52.0)
Hemoglobin: 8.6 g/dL — ABNORMAL LOW (ref 13.0–17.0)
Hemoglobin: 8.5 g/dL — ABNORMAL LOW (ref 13.0–17.0)
MCH: 27.3 pg (ref 26.0–34.0)
MCH: 27.2 pg (ref 26.0–34.0)
MCHC: 32.8 g/dL (ref 30.0–36.0)
MCHC: 32.9 g/dL (ref 30.0–36.0)
MCV: 83.2 fL (ref 80.0–100.0)
MCV: 82.7 fL (ref 80.0–100.0)
Platelets: 279 K/uL (ref 150–400)
Platelets: 290 K/uL (ref 150–400)
RBC: 3.15 MIL/uL — ABNORMAL LOW (ref 4.22–5.81)
RBC: 3.12 MIL/uL — ABNORMAL LOW (ref 4.22–5.81)
RDW: 15.7 % — ABNORMAL HIGH (ref 11.5–15.5)
RDW: 15.9 % — ABNORMAL HIGH (ref 11.5–15.5)
WBC: 10.3 K/uL (ref 4.0–10.5)
WBC: 11.2 K/uL — ABNORMAL HIGH (ref 4.0–10.5)
nRBC: 0 % (ref 0.0–0.2)
nRBC: 0 % (ref 0.0–0.2)

## 2024-12-06 LAB — PROTIME-INR
INR: 3.5 — ABNORMAL HIGH (ref 0.8–1.2)
INR: 3.2 — ABNORMAL HIGH (ref 0.8–1.2)
Prothrombin Time: 36.6 s — ABNORMAL HIGH (ref 11.4–15.2)
Prothrombin Time: 33.8 s — ABNORMAL HIGH (ref 11.4–15.2)

## 2024-12-06 MED ORDER — WARFARIN - PHARMACIST DOSING INPATIENT: Freq: Every day | Status: DC

## 2024-12-06 MED ORDER — VITAMIN K1 10 MG/ML IJ SOLN
5.0000 mg | Freq: Once | INTRAVENOUS | Status: AC
  Administered 2024-12-06: 17:00:00 5 mg via INTRAVENOUS
  Filled 2024-12-06: qty 0.5

## 2024-12-06 NOTE — Progress Notes (Addendum)
 PHARMACY - ANTICOAGULATION CONSULT NOTE  Pharmacy Consult for warfarin Indication: atrial fibrillation  Allergies[1]  Patient Measurements: Weight: 87.3 kg (192 lb 7.4 oz)  Vital Signs: Temp: 98.2 F (36.8 C) (12/17 0957) Temp Source: Oral (12/17 0917) BP: 110/54 (12/17 0957) Pulse Rate: 55 (12/17 0957)  Labs: Recent Labs    12/05/24 1108 12/05/24 1735 12/06/24 0500  HGB 8.7* 8.7* 8.6*  HCT 26.0* 25.7* 26.2*  PLT 279 277 279  LABPROT 37.1*  --  36.6*  INR 3.6*  --  3.5*  CREATININE 1.04  --   --     Estimated Creatinine Clearance: 49.9 mL/min (by C-G formula based on SCr of 1.04 mg/dL).   Medical History: Past Medical History:  Diagnosis Date   A-fib [facility])    Atypical chest pain    a. Nonobstructive, very minor irregs by cath 2007;  b. 06/2009 Ex MV: small area of apical lateral ischemia;  c. 06/2009 Cath: essentially normal;  d. 04/2014 Lexiscan  MV: EF 59%, no ischemia/infarct->low risk.   Cancer (HCC)    Chronic anticoagulation    a. Chronic Coumadin    Chronic anxiety    Diverticulosis    Dysrhythmia    atrial fib   GERD (gastroesophageal reflux disease)    Hypertension    IBS (irritable bowel syndrome)    Nonischemic cardiomyopathy (HCC)    a. EF 45% by echo 2010;  b. 05/2012 Echo: EF 55-65%, no rwma, Gr 1 DD, mild AI/MR, mildly dil LA.   Paralyzed vocal cords    Paroxysmal atrial fibrillation (HCC)    a. Treated with flecainide    Proteinuria    History of    Medications:  Scheduled:   ferrous sulfate   325 mg Oral Q breakfast   finasteride   5 mg Oral Daily   multivitamin with minerals  1 tablet Oral Daily   pantoprazole  (PROTONIX ) IV  40 mg Intravenous Q12H   tamsulosin   0.4 mg Oral QPC breakfast    Assessment: 89 y/o male admitted with syncope after a fall at home. PMH significant for atrial fibrillation on warfarin, HTN, GERD, anxiety, and IBS. On presentation to the ED, patient's INR was 3.6 and hemoglobin was 8.7 (dropped from 11.1 in 10/2024).  CT imaging negative for intracranial hemorrhage. GI is following to assess for possible GI bleed. Pharmacy has been consulted to manage warfarin while patient is admitted.  Of note, patient's home warfarin dose was recently reduced from 2.5 mg daily to 2 mg daily (office visit note from 12/01/24) due to supratherapeutic INR.  Home warfarin regimen: 2 mg daily (14 mg total weekly dose) Baseline labs: Hgb 8.6, plt 279, INR 3.5  Date INR Warfarin Dose 12/16 3.6 Held 12/17 3.5 Held  Goal of Therapy:  INR 2-3 Monitor platelets by anticoagulation protocol: Yes   Plan:  INR remains supratherapeutic s/p vitamin K  5 mg PO x1 on 12/16 - will continue to hold warfarin F/u GI plans for EGD/intervention for possible GI bleed Continue to monitor INR daily while inpatient Monitor CBC and signs/symptoms of bleeding  Thank you for involving pharmacy in this patient's care.   Damien Napoleon, PharmD Clinical Pharmacist 12/06/2024 1:12 PM  ______________________________________________________ [phone] Update  INR this afternoon is 3.2.   Plan: Give vitamin K  5 mg IV x1 this afternoon F/u GI plan for intervention for GI bleed F/u INR in AM  Thank you for involving pharmacy in this patient's care.   Damien Napoleon, PharmD Clinical Pharmacist 12/06/2024 3:05 PM     [  1]  Allergies Allergen Reactions   Ciprofloxacin     Diarrhea and stomach pains   Lanoxin [Digoxin]    Lopressor [Metoprolol Tartrate]    Penicillins     Other reaction(s): Unknown   Sulfonamide Derivatives     Rash    Verapamil    Sulfa Antibiotics Rash

## 2024-12-06 NOTE — Care Management Obs Status (Signed)
 MEDICARE OBSERVATION STATUS NOTIFICATION   Patient Details  Name: Robert Burch MRN: [id number] Date of Birth: 10-Aug-1933   Medicare Observation Status Notification Given:  Yes    [name] 12/06/2024, 4:22 PM

## 2024-12-06 NOTE — Progress Notes (Signed)
 Robert Copping, MD [facility]   [address], [address] [place], [place] [address] Phone: [phone] Fax : [phone]   Subjective: This patient came in with a finding of anemia after repeated falls.  The patient's hemoglobin had come down from the baseline of about 11 down to 8.7.  The patient's hemoglobin this morning was 8.6.  His INR did not change much after having vitamin K  yesterday.  The patient has no complaints today and states that he has not had a bowel movement.   Objective: Vital signs in last 24 hours: Vitals:   12/06/24 0522 12/06/24 0630 12/06/24 0917 12/06/24 0957  BP:  (!) 145/89 (!) 111/56 (!) 110/54  Pulse:  (!) 53 60 (!) 55  Resp:  14 14 18   Temp: 98.4 F (36.9 C)  98.6 F (37 C) 98.2 F (36.8 C)  TempSrc: Oral  Oral   SpO2:  96% 100% 100%  Weight:       Weight change:   Intake/Output Summary (Last 24 hours) at 12/06/2024 1034 Last data filed at 12/06/2024 [id number] Gross per 24 hour  Intake --  Output 300 ml  Net -300 ml     Exam: General: Patient lying in bed in no apparent distress alert and oriented x 3   Lab Results: @LABTEST2 @ Micro Results: No results found for this or any previous visit (from the past 240 hours). Studies/Results: DG Pelvis 1-2 Views Result Date: 12/05/2024 EXAM: 1 or 2 VIEW(S) XRAY OF THE PELVIS 12/05/2024 12:16:00 PM COMPARISON: Comparison with 03/17/2020. CLINICAL HISTORY: Hip pain after a fall. FINDINGS: BONES AND JOINTS: Postoperative changes with right hip hemiarthroplasty. The femoral stem is not completely included within the field of view. The visualized portion of the arthroplasty appears well seated. No acute fracture or dislocation. The pelvis appears intact. SI joints and symphysis pubis are not displaced. Degenerative changes in the lower lumbar spine. SOFT TISSUES: The soft tissues are unremarkable. IMPRESSION: 1. No acute fracture or dislocation. 2. Right hip hemiarthroplasty with the visualized portion appearing  well seated. Electronically signed by: Elsie Gravely MD 12/05/2024 12:34 PM EST RP Workstation: [id number]   CT Cervical Spine Wo Contrast Result Date: 12/05/2024 EXAM: CT CERVICAL SPINE WITHOUT CONTRAST 12/05/2024 12:06:56 PM TECHNIQUE: CT of the cervical spine was performed without the administration of intravenous contrast. Multiplanar reformatted images are provided for review. Automated exposure control, iterative reconstruction, and/or weight based adjustment of the mA/kV was utilized to reduce the radiation dose to as low as reasonably achievable. COMPARISON: CT of the cervical spine dated 03/16/2020. CLINICAL HISTORY: Neck trauma (Age >= 65y). FINDINGS: BONES AND ALIGNMENT: There is no evidence of acute traumatic injury. There is slight degenerative anterolisthesis at C5-C6. There is a mild levoscoliotic curvature of the cervical spine. The left facets at C2-C3 are fused and the right facets at C3-C4 are fused. DEGENERATIVE CHANGES: There is moderate bilateral facet arthrosis. SOFT TISSUES: No prevertebral soft tissue swelling. IMPRESSION: 1. No evidence of acute traumatic injury. 2. 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 3. Slight degenerative event of listhesis at C5-6. 4. Fused left facets at C2-3 and right facets at C3-4. 5. Moderate bilateral facet arthrosis. Electronically signed by: Evalene Coho MD 12/05/2024 12:13 PM EST RP Workstation: [id number]   CT HEAD WO CONTRAST Result Date: 12/05/2024 EXAM: CT HEAD WITHOUT CONTRAST 12/05/2024 12:06:56 PM TECHNIQUE: CT of the head was performed without the administration of intravenous contrast. Automated exposure control, iterative reconstruction, and/or weight based adjustment of the mA/kV was  utilized to reduce the radiation dose to as low as reasonably achievable. COMPARISON: CT of the head dated 03/16/2020. CLINICAL HISTORY: Head trauma, minor (Age >= 65y). FINDINGS: BRAIN AND VENTRICLES: No acute hemorrhage. No evidence of  acute infarct. No extra-axial collection. No mass effect or midline shift. Patchy and confluent areas of decreased attenuation throughout the deep and periventricular white matter of the cerebral hemispheres bilaterally, compatible with chronic microvascular ischemic disease. Cerebral ventricle sizes concordant with the degree of cerebral volume loss. Atherosclerotic calcifications within the cavernous internal carotid and vertebral arteries. ORBITS: No acute abnormality. Bilateral lens replacement. SINUSES: No acute abnormality. Left mastoid air cell effusion. SOFT TISSUES AND SKULL: No acute soft tissue abnormality. No skull fracture. IMPRESSION: 1. No acute intracranial abnormality related to the head trauma. 2. Chronic microvascular ischemic disease in the deep and periventricular white matter of the cerebral hemispheres bilaterally. 3. Cerebral volume loss with ventricle sizes concordant with the degree of volume loss. 4. Atherosclerotic calcifications within the cavernous internal carotid and vertebral arteries. 5. Left mastoid air cell effusion. Electronically signed by: Timothy Berrigan MD 12/05/2024 12:10 PM EST RP Workstation: [id number]   Medications: I have reviewed the patient's current medications. Scheduled Meds:  ferrous sulfate   325 mg Oral Q breakfast   finasteride   5 mg Oral Daily   multivitamin with minerals  1 tablet Oral Daily   pantoprazole  (PROTONIX ) IV  40 mg Intravenous Q12H   tamsulosin   0.4 mg Oral QPC breakfast   Continuous Infusions: PRN Meds:.acetaminophen  **OR** acetaminophen , alum & mag hydroxide-simeth, hydrALAZINE , morphine  injection, ondansetron  **OR** ondansetron  (ZOFRAN ) IV   Assessment: Principal Problem:   Symptomatic anemia Active Problems:   Essential hypertension   Syncope and collapse   Paroxysmal atrial fibrillation (HCC)   GERD (gastroesophageal reflux disease)   BPH (benign prostatic hyperplasia)   Hyperlipidemia   GI bleed    Plan: This  patient came in with symptomatic anemia with repeated falls and a hemoglobin of 8.7.  The patient's hemoglobin this morning 3.6 and the INR has not come down much after being given vitamin K .  The patient and his family have been told that due to their not being in urgency for immediate luminal evaluation due to the stable hemoglobin I would wait for the INR to come down.  I spoke to the hospitalist who stated that he has been to check the INR again today.  Target goal for the INR would be below 1.5 prior to proceeding with any EGD or colonoscopies.   LOS: 0 days   Robert Copping, MD.FACG 12/06/2024, 10:34 AM Pager [phone] 7am-5pm  Check AMION for 5pm -7am coverage and on weekends

## 2024-12-06 NOTE — ED Notes (Signed)
 Provided snack tray and water. Pt boosted in bed

## 2024-12-06 NOTE — Progress Notes (Signed)
 No SCD's at this time due to none being available

## 2024-12-06 NOTE — TOC Initial Note (Signed)
 Transition of Care [facility]) - Initial/Assessment Note    Patient Details  Name: Robert Burch MRN: [id number] Date of Birth: [birth year]  [facility] CM/SW Contact:    Corean ONEIDA Haddock, RN Phone Number: 12/06/2024, 3:40 PM  Clinical Narrative:                  Noted recs for SNF.  Bed search initiated, CM to follow up with patient       Patient Goals and CMS Choice            Expected Discharge Plan and Services                                              Prior Living Arrangements/Services                       Activities of Daily Living   ADL Screening (condition at time of admission) Independently performs ADLs?: Yes (appropriate for developmental age) Is the patient deaf or have difficulty hearing?: No Does the patient have difficulty seeing, even when wearing glasses/contacts?: No Does the patient have difficulty concentrating, remembering, or making decisions?: No  Permission Sought/Granted                  Emotional Assessment              Admission diagnosis:  Symptomatic anemia [D64.9] Gastrointestinal hemorrhage, unspecified gastrointestinal hemorrhage type [K92.2] Patient Active Problem List   Diagnosis Date Noted   Symptomatic anemia 12/05/2024   GI bleed 12/05/2024   Congestive heart failure (HCC) 10/31/2024   Acute heart failure with preserved ejection fraction (HFpEF) (HCC) 10/31/2024   CHF exacerbation (HCC) 10/29/2024   Periprosthetic fracture of proximal end of femur 10/03/2023   Leukocytosis 10/02/2023   Age related osteoporosis 07/16/2020   PAC (premature atrial contraction) 07/06/2019   PVC (premature ventricular contraction) 07/06/2019   Anemia 10/12/2018   Sinus bradycardia 09/23/2018   Coronary artery calcification seen on CAT scan 11/06/2017   Diverticulum of bladder 11/26/2015   Gross hematuria 10/29/2015   Hyperlipidemia 04/22/2015   Cervical radiculitis 05/22/2014   DDD  (degenerative disc disease), lumbar 05/22/2014   Lumbar stenosis with neurogenic claudication 05/22/2014   Paroxysmal atrial fibrillation (HCC)    Atypical chest pain    GERD (gastroesophageal reflux disease)    Elevated prostate specific antigen (PSA) 12/06/2013   Anxiety 10/17/2013   BPH (benign prostatic hyperplasia) 08/31/2013   Chronic prostatitis 08/31/2013   ED (erectile dysfunction) of organic origin 08/31/2013   Incomplete emptying of bladder 08/31/2013   Syncope and collapse 12/07/2011   Snoring 07/02/2011   Abnormal respiratory rate 07/02/2011   Essential hypertension 07/22/2009   Orthostatic hypotension 07/22/2009   PCP:  Glover Lenis, MD Pharmacy:   [facility] [phone] [address], [place] - [address] [place] [address] Phone: [phone] Fax: [phone]  [facility] [place], [place] - [address] [place] [address] Phone: [phone] Fax: [phone]     Social Drivers of Health (SDOH) Social History: SDOH Screenings   Food Insecurity: No Food Insecurity (12/06/2024)  Housing: Low Risk (12/06/2024)  Transportation Needs: No Transportation Needs (12/06/2024)  Utilities: Not At Risk (12/06/2024)  Financial Resource Strain: Low Risk  (08/10/2023)   Received from [facility]  Social Connections: Unknown (12/06/2024)  Tobacco Use: Low Risk  (12/01/2024)   Received from [facility]   SDOH Interventions:     Readmission Risk Interventions     No data to display

## 2024-12-06 NOTE — ED Notes (Signed)
 Pt eating breakfast at this time.

## 2024-12-06 NOTE — Evaluation (Signed)
 Physical Therapy Evaluation Patient Details Name: Robert Burch MRN: [id number] DOB: 12-23-1932 Today's Date: 12/06/2024  History of Present Illness  Robert Burch is a 89 y.o. male with medical history significant of A-fib on Coumadin , nonischemic cardiomyopathy, chronic anxiety, IBS, hypertension, GERD presented to the emergency department for evaluation of lightheadedness, fall.  Patient states that he had fallen 10 days ago, does have bilateral hip and knee pain, bruise over his thigh.  Today patient reported dizziness, lightheadedness before fall, fell front side, hit his head. All imaging negative for acute injury.   Clinical Impression  Patient received in bed, patient's wife and son arrived at beginning of session. Patient is alert, demonstrates poor safety awareness and poor ability to describe pain. He required mod A +2 for supine to sit and mod to max +2 to return to bed. He stands with min +2 A and ambulated in room with RW with min +2 A for safety. Patient will continue to benefit from skilled PT to improve independence, and safety for fall prevention.            If plan is discharge home, recommend the following: A lot of help with walking and/or transfers;A lot of help with bathing/dressing/bathroom;Help with stairs or ramp for entrance   Can travel by private vehicle   Yes    Equipment Recommendations None recommended by PT  Recommendations for Other Services       Functional Status Assessment Patient has had a recent decline in their functional status and demonstrates the ability to make significant improvements in function in a reasonable and predictable amount of time.     Precautions / Restrictions Precautions Precautions: Fall Recall of Precautions/Restrictions: Impaired Restrictions Weight Bearing Restrictions Per Provider Order: No      Mobility  Bed Mobility Overal bed mobility: Needs Assistance Bed Mobility: Supine to Sit     Supine  to sit: Min assist, +2 for safety/equipment     General bed mobility comments: pain in left hip during bed mobility. Posterior leaning    Transfers Overall transfer level: Needs assistance Equipment used: Rolling walker (2 wheels) Transfers: Sit to/from Stand Sit to Stand: Min assist, +2 physical assistance, Mod assist           General transfer comment: posterior leaning in standing    Ambulation/Gait Ambulation/Gait assistance: Min assist, +2 physical assistance Gait Distance (Feet): 15 Feet Assistive device: Rolling walker (2 wheels) Gait Pattern/deviations: Step-through pattern, Decreased step length - right, Decreased step length - left, Decreased stride length Gait velocity: decr     General Gait Details: patient able to ambulate in room. Initially demonstrates posterior leaning in standing. Poor awareness. Requiring cues for direction, mobility, safety. No reported LE pain when ambulating.  Stairs            Wheelchair Mobility     Tilt Bed    Modified Rankin (Stroke Patients Only)       Balance Overall balance assessment: Needs assistance Sitting-balance support: Feet supported Sitting balance-Leahy Scale: Poor   Postural control: Posterior lean Standing balance support: Bilateral upper extremity supported, During functional activity, Reliant on assistive device for balance Standing balance-Leahy Scale: Poor                               Pertinent Vitals/Pain Pain Assessment Pain Assessment: Faces Faces Pain Scale: Hurts little more Pain Location: L hip with bed mobility Pain Descriptors / Indicators: Discomfort,  Grimacing, Guarding Pain Intervention(s): Monitored during session, Repositioned    Home Living Family/patient expects to be discharged to:: Private residence Living Arrangements: Spouse/significant other Available Help at Discharge: Family;Available PRN/intermittently Type of Home: House Home Access: Stairs to  enter Entrance Stairs-Rails: Right Entrance Stairs-Number of Steps: 3   Home Layout: Laundry or work area in basement;Able to live on main level with bedroom/bathroom Home Equipment: Rollator (4 wheels);Wheelchair - manual      Prior Function Prior Level of Function : Needs assist;History of Falls (last six months)       Physical Assist : Mobility (physical);ADLs (physical)     Mobility Comments: frequent falls ADLs Comments: wife assists as needed for bathing     Extremity/Trunk Assessment   Upper Extremity Assessment Upper Extremity Assessment: Defer to OT evaluation    Lower Extremity Assessment Lower Extremity Assessment: Generalized weakness;LLE deficits/detail LLE: Unable to fully assess due to pain LLE Coordination: decreased gross motor    Cervical / Trunk Assessment Cervical / Trunk Assessment: Normal  Communication   Communication Communication: Impaired Factors Affecting Communication: Difficulty expressing self    Cognition Arousal: Alert Behavior During Therapy: WFL for tasks assessed/performed   PT - Cognitive impairments: Problem solving, Sequencing, Initiation, Awareness, Safety/Judgement                         Following commands: Impaired Following commands impaired: Follows one step commands inconsistently, Follows one step commands with increased time     Cueing Cueing Techniques: Verbal cues, Gestural cues, Tactile cues     General Comments      Exercises     Assessment/Plan    PT Assessment Patient needs continued PT services  PT Problem List Decreased strength;Decreased range of motion;Decreased activity tolerance;Decreased balance;Decreased mobility;Decreased cognition;Decreased knowledge of use of DME;Decreased safety awareness;Decreased knowledge of precautions;Pain       PT Treatment Interventions DME instruction;Gait training;Stair training;Functional mobility training;Therapeutic activities;Therapeutic  exercise;Balance training;Cognitive remediation;Neuromuscular re-education;Patient/family education    PT Goals (Current goals can be found in the Care Plan section)  Acute Rehab PT Goals Patient Stated Goal: none stated by patient PT Goal Formulation: Patient unable to participate in goal setting Time For Goal Achievement: 12/20/24    Frequency Min 2X/week     Co-evaluation PT/OT/SLP Co-Evaluation/Treatment: Yes Reason for Co-Treatment: Necessary to address cognition/behavior during functional activity;For patient/therapist safety;To address functional/ADL transfers PT goals addressed during session: Mobility/safety with mobility;Balance;Proper use of DME         AM-PAC PT 6 Clicks Mobility  Outcome Measure Help needed turning from your back to your side while in a flat bed without using bedrails?: A Lot Help needed moving from lying on your back to sitting on the side of a flat bed without using bedrails?: A Lot Help needed moving to and from a bed to a chair (including a wheelchair)?: A Little Help needed standing up from a chair using your arms (e.g., wheelchair or bedside chair)?: A Little Help needed to walk in hospital room?: A Little Help needed climbing 3-5 steps with a railing? : A Lot 6 Click Score: 15    End of Session   Activity Tolerance: Patient tolerated treatment well Patient left: in bed;with call bell/phone within reach;with family/visitor present;with nursing/sitter in room Nurse Communication: Mobility status PT Visit Diagnosis: Other abnormalities of gait and mobility (R26.89);Unsteadiness on feet (R26.81);Muscle weakness (generalized) (M62.81);Difficulty in walking, not elsewhere classified (R26.2);Repeated falls (R29.6);Pain Pain - Right/Left: Left Pain - part of  body: Hip    Time: 9090-9071 PT Time Calculation (min) (ACUTE ONLY): 19 min   Charges:   PT Evaluation $PT Eval Moderate Complexity: 1 Mod   PT General Charges $$ ACUTE PT VISIT: 1  Visit         Chapel Silverthorn, PT, GCS 12/06/2024,10:01 AM

## 2024-12-06 NOTE — ED Notes (Signed)
 Pt resting while leaning to the left and right foot up against bed rail. Upon assessment, red blanchable area found on right foot. Mediplex foam padding applied to area and pt repositioned.

## 2024-12-06 NOTE — H&P (Deleted)
 History and Physical    Patient: Robert Burch FMW:[id number] DOB: 22-Jun-1933 DOA: 12/05/2024 DOS: the patient was seen and examined on 12/06/2024 PCP: Glover Lenis, MD  Patient coming from: Home  Chief Complaint:  Chief Complaint  Patient presents with   Loss of Consciousness   HPI: Robert Burch is a 89 y.o. male with medical history significant of A-fib on Coumadin , nonischemic cardiomyopathy, chronic anxiety, IBS, hypertension, GERD presented to the emergency department for evaluation of lightheadedness and Recurrent falls recurrent falls.  In the emergency department, patient is hemodynamically stable, laboratory workup showed INR 3.6, hemoglobin 8.7 dropped from 11.1 a month ago.  Sodium 130.  CT head unremarkable, CT cervical spine negative.  Pelvic x-ray negative.  EKG showed A-fib but heart rate 72 no ST-T wave changes.  ED provider performed shoulder exam which is dark brown, guaiac positive requested hospitalist admission for further management evaluation of symptomatic anemia, possible GI bleed in the setting of use of anticoagulation.   Review of Systems: As mentioned in the history of present illness. All other systems reviewed and are negative. Past Medical History:  Diagnosis Date   A-fib [facility])    Atypical chest pain    a. Nonobstructive, very minor irregs by cath 2007;  b. 06/2009 Ex MV: small area of apical lateral ischemia;  c. 06/2009 Cath: essentially normal;  d. 04/2014 Lexiscan  MV: EF 59%, no ischemia/infarct->low risk.   Cancer (HCC)    Chronic anticoagulation    a. Chronic Coumadin    Chronic anxiety    Diverticulosis    Dysrhythmia    atrial fib   GERD (gastroesophageal reflux disease)    Hypertension    IBS (irritable bowel syndrome)    Nonischemic cardiomyopathy (HCC)    a. EF 45% by echo 2010;  b. 05/2012 Echo: EF 55-65%, no rwma, Gr 1 DD, mild AI/MR, mildly dil LA.   Paralyzed vocal cords    Paroxysmal atrial fibrillation (HCC)     a. Treated with flecainide    Proteinuria    History of   Past Surgical History:  Procedure Laterality Date   CARDIAC CATHETERIZATION     cataract surgery Bilateral    HIP ARTHROPLASTY Right 03/17/2020   Procedure: ARTHROPLASTY BIPOLAR HIP (HEMIARTHROPLASTY);  Surgeon: [name], MD;  Location: [facility] ORS;  Service: Orthopedics;  Laterality: Right;   MASS BIOPSY  2013   Social History:  reports that he has never smoked. He has never used smokeless tobacco. He reports that he does not drink alcohol and does not use drugs.  Allergies[1]  Family History  Problem Relation Age of Onset   Heart attack Neg Hx     Prior to Admission medications  Medication Sig Start Date End Date Taking? Authorizing Provider  acetaminophen  (TYLENOL ) 325 MG tablet Take 650 mg by mouth every 6 (six) hours as needed.    [provider]  acidophilus (RISAQUAD) CAPS capsule Take 1 capsule by mouth daily.    [provider]  alendronate  (FOSAMAX ) 70 MG tablet Take 1 tablet (70 mg total) by mouth once a week. 11/16/23   Caro Harlene POUR, NP  alum & mag hydroxide-simeth (MAALOX PLUS) 400-400-40 MG/5ML suspension Take 30 mLs by mouth every 4 (four) hours as needed for indigestion.    [provider]  ascorbic acid (VITAMIN C) 500 MG tablet Take 500 mg by mouth daily.    [provider]  Benzocaine-Menthol  (ORAJEL 2X TOOTHACHE & GUM) 20-0.26 % GEL Use as directed in the mouth  or throat every 6 (six) hours as needed.    [provider]  bisacodyl  (DULCOLAX) 5 MG EC tablet Take 1 tablet (5 mg total) by mouth daily as needed for moderate constipation. 10/05/23   Josette Ade, MD  calcium -vitamin D (OSCAL WITH D) 500-5 MG-MCG tablet Take 1 tablet by mouth daily. 10/06/23   Josette Ade, MD  Cholecalciferol  (VITAMIN D3) 400 UNITS CAPS Take 800 Units by mouth daily.     [provider]  clotrimazole-betamethasone (LOTRISONE) cream Apply 1 Application topically as  needed. 09/20/23   [provider]  cyanocobalamin (VITAMIN B12) 1000 MCG tablet Take 1,000 mcg by mouth daily.    [provider]  diclofenac (VOLTAREN) 50 MG EC tablet Take 50 mg by mouth as needed.    [provider]  finasteride  (PROSCAR ) 5 MG tablet Take 1 tablet (5 mg total) by mouth daily. Patient not taking: Reported on 12/01/2024 01/06/24   Francisca Redell BROCKS, MD  Flaxseed, Linseed, (FLAX SEED OIL) 1000 MG CAPS Take 1,000 mg by mouth daily.     [provider]  flecainide  (TAMBOCOR ) 100 MG tablet Take 1 tablet (100 mg total) by mouth every 12 (twelve) hours as needed (atrial fibrillation or palpitations). 11/16/23   Caro Harlene POUR, NP  furosemide  (LASIX ) 40 MG tablet Take 1 tablet (40 mg total) by mouth daily. 11/10/24   Donette Ellouise LABOR, FNP  hydrocortisone (ANUSOL-HC) 25 MG suppository Place 25 mg rectally daily as needed for hemorrhoids or anal itching.    [provider]  lidocaine  4 % Place 1 patch onto the skin. As needed    [provider]  magnesium  oxide (MAG-OX) 400 MG tablet Take 1 tablet (400 mg total) by mouth 2 (two) times daily. 08/15/24   [name], Timothy J, MD  Multiple Vitamin (MULTIVITAMIN) tablet Take 1 tablet by mouth daily.      [provider]  Omega-3 Fatty Acids (FISH OIL) 1000 MG CAPS Take 1,000 mg by mouth daily.     [provider]  pantoprazole  (PROTONIX ) 20 MG tablet Take 20 mg by mouth daily. 07/31/24   [provider]  polyethylene glycol (MIRALAX  / GLYCOLAX ) packet Take 17 g by mouth daily as needed for mild constipation or moderate constipation.    [provider]  tamsulosin  (FLOMAX ) 0.4 MG CAPS capsule One capsule po at bedtime (start on 10/07/23) 08/15/24   [name], Timothy J, MD  warfarin (COUMADIN ) 2.5 MG tablet Take 2.5 mg by mouth daily. 11/17/23   [provider]    Physical Exam: Vitals:   12/06/24 0522 12/06/24 0630 12/06/24 0917 12/06/24 0957  BP:   (!) 145/89 (!) 111/56 (!) 110/54  Pulse:  (!) 53 60 (!) 55  Resp:  14 14 18   Temp: 98.4 F (36.9 C)  98.6 F (37 C) 98.2 F (36.8 C)  TempSrc: Oral  Oral   SpO2:  96% 100% 100%  Weight:       General - Elderly Caucasian obese male, distress due to pain HEENT - PERRLA, EOMI, atraumatic head, non tender sinuses. Lung - Clear, basal rales, no rhonchi, wheezes. Heart - S1, S2 heard, no murmurs, rubs, trace pedal edema. Abdomen - Soft, bilateral hip tender, bowel sounds good Neuro - Alert, awake and oriented x 3, non focal exam. Skin - Warm and dry.  Left thigh large bruise noted  Data Reviewed:     Latest Ref Rng & Units 12/06/2024    5:00 AM 12/05/2024  5:35 PM 12/05/2024   11:08 AM  CBC  WBC 4.0 - 10.5 K/uL 10.3  10.2  10.7   Hemoglobin 13.0 - 17.0 g/dL 8.6  8.7  8.7   Hematocrit 39.0 - 52.0 % 26.2  25.7  26.0   Platelets 150 - 400 K/uL 279  277  279        Latest Ref Rng & Units 12/05/2024   11:08 AM 10/31/2024   11:21 AM 10/30/2024    3:35 AM  BMP  Glucose 70 - 99 mg/dL 86  99  90   BUN 8 - 23 mg/dL 23  20  17    Creatinine 0.61 - 1.24 mg/dL 8.95  8.95  9.12   Sodium 135 - 145 mmol/L 130  136  136   Potassium 3.5 - 5.1 mmol/L 4.2  4.1  3.8   Chloride 98 - 111 mmol/L 94  99  102   CO2 22 - 32 mmol/L 26  28  24    Calcium  8.9 - 10.3 mg/dL 8.7  8.6  8.2    DG Pelvis 1-2 Views Result Date: 12/05/2024 EXAM: 1 or 2 VIEW(S) XRAY OF THE PELVIS 12/05/2024 12:16:00 PM COMPARISON: Comparison with 03/17/2020. CLINICAL HISTORY: Hip pain after a fall. FINDINGS: BONES AND JOINTS: Postoperative changes with right hip hemiarthroplasty. The femoral stem is not completely included within the field of view. The visualized portion of the arthroplasty appears well seated. No acute fracture or dislocation. The pelvis appears intact. SI joints and symphysis pubis are not displaced. Degenerative changes in the lower lumbar spine. SOFT TISSUES: The soft tissues are unremarkable. IMPRESSION: 1.  No acute fracture or dislocation. 2. Right hip hemiarthroplasty with the visualized portion appearing well seated. Electronically signed by: Elsie Gravely MD 12/05/2024 12:34 PM EST RP Workstation: [id number]   CT Cervical Spine Wo Contrast Result Date: 12/05/2024 EXAM: CT CERVICAL SPINE WITHOUT CONTRAST 12/05/2024 12:06:56 PM TECHNIQUE: CT of the cervical spine was performed without the administration of intravenous contrast. Multiplanar reformatted images are provided for review. Automated exposure control, iterative reconstruction, and/or weight based adjustment of the mA/kV was utilized to reduce the radiation dose to as low as reasonably achievable. COMPARISON: CT of the cervical spine dated 03/16/2020. CLINICAL HISTORY: Neck trauma (Age >= 65y). FINDINGS: BONES AND ALIGNMENT: There is no evidence of acute traumatic injury. There is slight degenerative anterolisthesis at C5-C6. There is a mild levoscoliotic curvature of the cervical spine. The left facets at C2-C3 are fused and the right facets at C3-C4 are fused. DEGENERATIVE CHANGES: There is moderate bilateral facet arthrosis. SOFT TISSUES: No prevertebral soft tissue swelling. IMPRESSION: 1. No evidence of acute traumatic injury. 2. 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 3. Slight degenerative event of listhesis at C5-6. 4. Fused left facets at C2-3 and right facets at C3-4. 5. Moderate bilateral facet arthrosis. Electronically signed by: Evalene Coho MD 12/05/2024 12:13 PM EST RP Workstation: [id number]   CT HEAD WO CONTRAST Result Date: 12/05/2024 EXAM: CT HEAD WITHOUT CONTRAST 12/05/2024 12:06:56 PM TECHNIQUE: CT of the head was performed without the administration of intravenous contrast. Automated exposure control, iterative reconstruction, and/or weight based adjustment of the mA/kV was utilized to reduce the radiation dose to as low as reasonably achievable. COMPARISON: CT of the head dated 03/16/2020. CLINICAL HISTORY:  Head trauma, minor (Age >= 65y). FINDINGS: BRAIN AND VENTRICLES: No acute hemorrhage. No evidence of acute infarct. No extra-axial collection. No mass effect or midline shift. Patchy and confluent areas of decreased attenuation throughout  the deep and periventricular white matter of the cerebral hemispheres bilaterally, compatible with chronic microvascular ischemic disease. Cerebral ventricle sizes concordant with the degree of cerebral volume loss. Atherosclerotic calcifications within the cavernous internal carotid and vertebral arteries. ORBITS: No acute abnormality. Bilateral lens replacement. SINUSES: No acute abnormality. Left mastoid air cell effusion. SOFT TISSUES AND SKULL: No acute soft tissue abnormality. No skull fracture. IMPRESSION: 1. No acute intracranial abnormality related to the head trauma. 2. Chronic microvascular ischemic disease in the deep and periventricular white matter of the cerebral hemispheres bilaterally. 3. Cerebral volume loss with ventricle sizes concordant with the degree of volume loss. 4. Atherosclerotic calcifications within the cavernous internal carotid and vertebral arteries. 5. Left mastoid air cell effusion. Electronically signed by: Evalene Coho MD 12/05/2024 12:10 PM EST RP Workstation: [id number]     Assessment and Plan: Treyvion Durkee is a 89 y.o. male with medical history significant of A-fib on Coumadin , nonischemic cardiomyopathy, chronic anxiety, IBS, hypertension, GERD presented to the emergency department for evaluation of lightheadedness, fall.  Patient was noted to have drop in hemoglobin from baseline around 11-12 to 8.7.  Stool guaiac positive.  Patient is currently being admitted for symptomatic anemia, GI evaluation.  Plan: Symptomatic anemia Acute blood loss anemia  Possible GI bleed-upper in the setting of anticoagulation. Presented with possible presyncope, fall. Admit the patient to medicine, telemetry under observation  service. Evaluated by GI, INR 3.5 on presentation which is still 3.5 despite vitamin K  yesterday.  INR need to be less than 1.5 for colonoscopy.  Will recheck INR level in the afternoon today.  Will give another dose of vitamin K  if needed. Monitor H&H and transfuse if less than 7. No bowel movements in the hospital so far.  Paroxysmal atrial fibrillation- Rate controlled.  Continue to monitor telemetry. Hold Coumadin  due to GI bleed. Will consult pharmacy to follow along and dose Coumadin  post procedure  GERD: Continue IV Protonix .  Hypertension: Will resume home antihypertensives as needed.  Currently blood pressure is stable IV hydralazine  as needed ordered.  Nonischemic cardiomyopathy- Echo from 10/31/2024 EF 45 to 50%.  Hold Lasix  at this time.  Watch for fluid overload.  Hyponatremia Possibly due to dehydration.  Will recheck sodium tomorrow. Hold Lasix , trend sodium.  Obesity class I: BMI 31.06. Diet, exercise and weight reduction advised   Advance Care Planning:   Code Status: Full Code discussed with patient, family at bedside  Consults: GI  Family Communication: Discussed with patient's wife, son at bedside.  They understand agree with the current plan  Severity of Illness: The appropriate patient status for this patient is OBSERVATION. Observation status is judged to be reasonable and necessary in order to provide the required intensity of service to ensure the patient's safety. The patient's presenting symptoms, physical exam findings, and initial radiographic and laboratory data in the context of their medical condition is felt to place them at decreased risk for further clinical deterioration. Furthermore, it is anticipated that the patient will be medically stable for discharge from the hospital within 2 midnights of admission.   Author: Curby Carswell, MD 12/06/2024 12:07 PM  For on call review [email or web address].       [1]  Allergies Allergen Reactions    Ciprofloxacin     Diarrhea and stomach pains   Lanoxin [Digoxin]    Lopressor [Metoprolol Tartrate]    Penicillins     Other reaction(s): Unknown   Sulfonamide Derivatives     Rash    Verapamil  Sulfa Antibiotics Rash

## 2024-12-06 NOTE — NC FL2 (Signed)
 [place]  MEDICAID FL2 LEVEL OF CARE FORM     IDENTIFICATION  Patient Name: Robert Burch Birthdate: 05-05-1933 Sex: male Admission Date (Current Location): 12/05/2024  [facility] [place] and [place] Number:  Chiropodist and Address:         Provider Number: [id number]  Attending Physician Name and Address:  Mcarthur Pick, MD  Relative Name and Phone Number:       Current Level of Care: Hospital Recommended Level of Care: Skilled Nursing Facility Prior Approval Number:    Date Approved/Denied:   PASRR Number: [id number]  Discharge Plan: SNF    Current Diagnoses: Patient Active Problem List   Diagnosis Date Noted   Symptomatic anemia 12/05/2024   GI bleed 12/05/2024   Congestive heart failure (HCC) 10/31/2024   Acute heart failure with preserved ejection fraction (HFpEF) (HCC) 10/31/2024   CHF exacerbation (HCC) 10/29/2024   Periprosthetic fracture of proximal end of femur 10/03/2023   Leukocytosis 10/02/2023   Age related osteoporosis 07/16/2020   PAC (premature atrial contraction) 07/06/2019   PVC (premature ventricular contraction) 07/06/2019   Anemia 10/12/2018   Sinus bradycardia 09/23/2018   Coronary artery calcification seen on CAT scan 11/06/2017   Diverticulum of bladder 11/26/2015   Gross hematuria 10/29/2015   Hyperlipidemia 04/22/2015   Cervical radiculitis 05/22/2014   DDD (degenerative disc disease), lumbar 05/22/2014   Lumbar stenosis with neurogenic claudication 05/22/2014   Paroxysmal atrial fibrillation (HCC)    Atypical chest pain    GERD (gastroesophageal reflux disease)    Elevated prostate specific antigen (PSA) 12/06/2013   Anxiety 10/17/2013   BPH (benign prostatic hyperplasia) 08/31/2013   Chronic prostatitis 08/31/2013   ED (erectile dysfunction) of organic origin 08/31/2013   Incomplete emptying of bladder 08/31/2013   Syncope and collapse 12/07/2011   Snoring 07/02/2011   Abnormal respiratory rate 07/02/2011    Essential hypertension 07/22/2009   Orthostatic hypotension 07/22/2009    Orientation RESPIRATION BLADDER Height & Weight     Time, Self, Situation, Place  Normal Continent Weight: 87.3 kg Height:     BEHAVIORAL SYMPTOMS/MOOD NEUROLOGICAL BOWEL NUTRITION STATUS      Continent Diet (Heart Healthy)  AMBULATORY STATUS COMMUNICATION OF NEEDS Skin   Limited Assist Verbally Bruising                       Personal Care Assistance Level of Assistance              Functional Limitations Info             SPECIAL CARE FACTORS FREQUENCY  PT (By licensed PT), OT (By licensed OT)                    Contractures Contractures Info: Not present    Additional Factors Info  Code Status, Allergies Code Status Info: Full Allergies Info: Ciprofloxacin, Lanoxin (Digoxin), Lopressor (Metoprolol Tartrate), Penicillins, Sulfonamide Derivatives, Verapamil, Sulfa Antibiotics           Current Medications (12/06/2024):  This is the current hospital active medication list Current Facility-Administered Medications  Medication Dose Route Frequency Provider Last Rate Last Admin   acetaminophen  (TYLENOL ) tablet 650 mg  650 mg Oral Q6H PRN Darci Pore, MD   650 mg at 12/06/24 1521   Or   acetaminophen  (TYLENOL ) suppository 650 mg  650 mg Rectal Q6H PRN Darci Pore, MD       alum & mag hydroxide-simeth (MAALOX/MYLANTA) 200-200-20 MG/5ML suspension 30 mL  30 mL  Oral Q6H PRN Darci Pore, MD   30 mL at 12/05/24 1903   ferrous sulfate  tablet 325 mg  325 mg Oral Q breakfast Darci Pore, MD   325 mg at 12/06/24 9175   finasteride  (PROSCAR ) tablet 5 mg  5 mg Oral Daily Sreeram, Narendranath, MD   5 mg at 12/06/24 1132   hydrALAZINE  (APRESOLINE ) injection 10 mg  10 mg Intravenous Q6H PRN Darci Pore, MD       morphine  (PF) 2 MG/ML injection 2 mg  2 mg Intravenous Q3H PRN Sreeram, Narendranath, MD       multivitamin with minerals tablet 1 tablet   1 tablet Oral Daily Sreeram, Narendranath, MD   1 tablet at 12/06/24 1132   ondansetron  (ZOFRAN ) tablet 4 mg  4 mg Oral Q6H PRN Darci Pore, MD       Or   ondansetron  (ZOFRAN ) injection 4 mg  4 mg Intravenous Q6H PRN Darci Pore, MD       pantoprazole  (PROTONIX ) injection 40 mg  40 mg Intravenous Q12H Paduchowski, Kevin, MD   40 mg at 12/06/24 1132   phytonadione  (VITAMIN K ) 5 mg in dextrose  5 % 50 mL IVPB  5 mg Intravenous Once Sigdel, Santosh, MD       tamsulosin  (FLOMAX ) capsule 0.4 mg  0.4 mg Oral QPC breakfast Darci Pore, MD   0.4 mg at 12/06/24 9166   Warfarin - Pharmacist Dosing Inpatient   Does not apply q1600 Elesa Perkins, [facility]         Discharge Medications: Please see discharge summary for a list of discharge medications.  Relevant Imaging Results:  Relevant Lab Results:   Additional Information SSN = [id number]  Corean ONEIDA Haddock, RN

## 2024-12-06 NOTE — Progress Notes (Signed)
 PROGRESS NOTE    Robert Burch  FMW:[id number] DOB: 1933/11/29 DOA: 12/05/2024 PCP: Glover Lenis, MD   Brief Narrative:    Robert Burch is a 89 y.o. male with medical history significant of A-fib on Coumadin , nonischemic cardiomyopathy, chronic anxiety, IBS, hypertension, GERD presented to the emergency department for evaluation of lightheadedness and Recurrent falls recurrent falls.   In the emergency department, patient is hemodynamically stable, laboratory workup showed INR 3.6, hemoglobin 8.7 dropped from 11.1 a month ago.  Sodium 130.  CT head unremarkable, CT cervical spine negative.  Pelvic x-ray negative.  EKG showed A-fib but heart rate 72 no ST-T wave changes.  ED provider performed shoulder exam which is dark brown, guaiac positive requested hospitalist admission for further management evaluation of symptomatic anemia, possible GI bleed in the setting of use of anticoagulation.   Assessment & Plan:   Principal Problem:   Symptomatic anemia Active Problems:   Paroxysmal atrial fibrillation (HCC)   Essential hypertension   Syncope and collapse   GERD (gastroesophageal reflux disease)   BPH (benign prostatic hyperplasia)   Hyperlipidemia   GI bleed   Assessment and Plan: Robert Burch is a 89 y.o. male with medical history significant of A-fib on Coumadin , nonischemic cardiomyopathy, chronic anxiety, IBS, hypertension, GERD presented to the emergency department for evaluation of lightheadedness, fall.  Patient was noted to have drop in hemoglobin from baseline around 11-12 to 8.7.  Stool guaiac positive.  Patient is currently being admitted for symptomatic anemia, GI evaluation.   Plan: Symptomatic anemia Acute blood loss anemia  Possible GI bleed-upper in the setting of anticoagulation. Presented with possible presyncope, fall. Admit the patient to medicine, telemetry under observation service. Evaluated by GI, INR 3.5 on presentation which  is still 3.5 despite vitamin K  yesterday.  INR need to be less than 1.5 for colonoscopy.  Will recheck INR level in the afternoon today.  Will give another dose of vitamin K  if needed. Monitor H&H and transfuse if less than 7. No bowel movements in the hospital so far.   Paroxysmal atrial fibrillation- Rate controlled.  Continue to monitor telemetry. Hold Coumadin  due to GI bleed. Will consult pharmacy to follow along and dose Coumadin  post procedure   GERD: Continue IV Protonix .   Hypertension: Will resume home antihypertensives as needed.  Currently blood pressure is stable IV hydralazine  as needed ordered.   Nonischemic cardiomyopathy- Echo from 10/31/2024 EF 45 to 50%.  Hold Lasix  at this time.  Watch for fluid overload.   Hyponatremia Possibly due to dehydration.  Will recheck sodium tomorrow. Hold Lasix , trend sodium.   Obesity class I: BMI 31.06. Diet, exercise and weight reduction advised    Advance Care Planning:   Code Status: Full Code discussed with patient, family at bedside   Consults: GI   Family Communication: Discussed with patient's wife, son at bedside.  They understand agree with the current plan   Severity of Illness: The appropriate patient status for this patient is OBSERVATION. Observation status is judged to be reasonable and necessary in order to provide the required intensity of service to ensure the patient's safety. The patient's presenting symptoms, physical exam findings, and initial radiographic and laboratory data in the context of their medical condition is felt to place them at decreased risk for further clinical deterioration. Furthermore, it is anticipated that the patient will be medically stable for discharge from the hospital within 2 midnights of admission.    Subjective:  Patient seen and examined  at the bedside.  Family members present.  Patient denies any bowel movements in the hospital.  Denies abdominal pain, fever or chills.  No  nausea or vomiting.  INR surprisingly still high at 3.6 despite vitamin K .  Will repeat INR in the afternoon and if acceptable for colonoscopy, will notify GI for prep  Objective: Vitals:   12/06/24 0522 12/06/24 0630 12/06/24 0917 12/06/24 0957  BP:  (!) 145/89 (!) 111/56 (!) 110/54  Pulse:  (!) 53 60 (!) 55  Resp:  14 14 18   Temp: 98.4 F (36.9 C)  98.6 F (37 C) 98.2 F (36.8 C)  TempSrc: Oral  Oral   SpO2:  96% 100% 100%  Weight:        Intake/Output Summary (Last 24 hours) at 12/06/2024 1332 Last data filed at 12/06/2024 9478 Gross per 24 hour  Intake --  Output 300 ml  Net -300 ml   Filed Weights   12/05/24 1100  Weight: 87.3 kg    Examination:  General - Elderly Caucasian obese male, distress due to pain HEENT - PERRLA, EOMI, atraumatic head, non tender sinuses. Lung - Clear, basal rales, no rhonchi, wheezes. Heart - S1, S2 heard, no murmurs, rubs, trace pedal edema. Abdomen - Soft, bilateral hip tender, bowel sounds good Neuro - Alert, awake and oriented x 3, non focal exam. Skin - Warm and dry.  Left thigh large bruise noted   Data Reviewed: I have personally reviewed following labs and imaging studies  CBC: Recent Labs  Lab 12/05/24 1108 12/05/24 1735 12/06/24 0500  WBC 10.7* 10.2 10.3  HGB 8.7* 8.7* 8.6*  HCT 26.0* 25.7* 26.2*  MCV 83.1 81.3 83.2  PLT 279 277 279   Basic Metabolic Panel: Recent Labs  Lab 12/01/24 1148 12/05/24 1108  NA  --  130*  K  --  4.2  CL  --  94*  CO2  --  26  GLUCOSE  --  86  BUN  --  23  CREATININE  --  1.04  CALCIUM   --  8.7*  MG 2.0  --    GFR: Estimated Creatinine Clearance: 49.9 mL/min (by C-G formula based on SCr of 1.04 mg/dL). Liver Function Tests: Recent Labs  Lab 12/05/24 1108  AST 26  ALT 20  ALKPHOS 42  BILITOT 1.1  PROT 6.4*  ALBUMIN 4.0   No results for input(s): LIPASE, AMYLASE in the last 168 hours. No results for input(s): AMMONIA in the last 168 hours. Coagulation  Profile: Recent Labs  Lab 12/05/24 1108 12/06/24 0500  INR 3.6* 3.5*   Cardiac Enzymes: No results for input(s): CKTOTAL, CKMB, CKMBINDEX, TROPONINI in the last 168 hours. BNP (last 3 results) No results for input(s): PROBNP in the last 8760 hours. HbA1C: No results for input(s): HGBA1C in the last 72 hours. CBG: No results for input(s): GLUCAP in the last 168 hours. Lipid Profile: No results for input(s): CHOL, HDL, LDLCALC, TRIG, CHOLHDL, LDLDIRECT in the last 72 hours. Thyroid Function Tests: No results for input(s): TSH, T4TOTAL, FREET4, T3FREE, THYROIDAB in the last 72 hours. Anemia Panel: No results for input(s): VITAMINB12, FOLATE, FERRITIN, TIBC, IRON, RETICCTPCT in the last 72 hours. Sepsis Labs: No results for input(s): PROCALCITON, LATICACIDVEN in the last 168 hours.  No results found for this or any previous visit (from the past 240 hours).       Radiology Studies: DG Pelvis 1-2 Views Result Date: 12/05/2024 EXAM: 1 or 2 VIEW(S) XRAY OF THE PELVIS 12/05/2024 12:16:00  PM COMPARISON: Comparison with 03/17/2020. CLINICAL HISTORY: Hip pain after a fall. FINDINGS: BONES AND JOINTS: Postoperative changes with right hip hemiarthroplasty. The femoral stem is not completely included within the field of view. The visualized portion of the arthroplasty appears well seated. No acute fracture or dislocation. The pelvis appears intact. SI joints and symphysis pubis are not displaced. Degenerative changes in the lower lumbar spine. SOFT TISSUES: The soft tissues are unremarkable. IMPRESSION: 1. No acute fracture or dislocation. 2. Right hip hemiarthroplasty with the visualized portion appearing well seated. Electronically signed by: Elsie Gravely MD 12/05/2024 12:34 PM EST RP Workstation: [id number]   CT Cervical Spine Wo Contrast Result Date: 12/05/2024 EXAM: CT CERVICAL SPINE WITHOUT CONTRAST 12/05/2024 12:06:56 PM TECHNIQUE:  CT of the cervical spine was performed without the administration of intravenous contrast. Multiplanar reformatted images are provided for review. Automated exposure control, iterative reconstruction, and/or weight based adjustment of the mA/kV was utilized to reduce the radiation dose to as low as reasonably achievable. COMPARISON: CT of the cervical spine dated 03/16/2020. CLINICAL HISTORY: Neck trauma (Age >= 65y). FINDINGS: BONES AND ALIGNMENT: There is no evidence of acute traumatic injury. There is slight degenerative anterolisthesis at C5-C6. There is a mild levoscoliotic curvature of the cervical spine. The left facets at C2-C3 are fused and the right facets at C3-C4 are fused. DEGENERATIVE CHANGES: There is moderate bilateral facet arthrosis. SOFT TISSUES: No prevertebral soft tissue swelling. IMPRESSION: 1. No evidence of acute traumatic injury. 2. 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 3. Slight degenerative event of listhesis at C5-6. 4. Fused left facets at C2-3 and right facets at C3-4. 5. Moderate bilateral facet arthrosis. Electronically signed by: Evalene Coho MD 12/05/2024 12:13 PM EST RP Workstation: [id number]   CT HEAD WO CONTRAST Result Date: 12/05/2024 EXAM: CT HEAD WITHOUT CONTRAST 12/05/2024 12:06:56 PM TECHNIQUE: CT of the head was performed without the administration of intravenous contrast. Automated exposure control, iterative reconstruction, and/or weight based adjustment of the mA/kV was utilized to reduce the radiation dose to as low as reasonably achievable. COMPARISON: CT of the head dated 03/16/2020. CLINICAL HISTORY: Head trauma, minor (Age >= 65y). FINDINGS: BRAIN AND VENTRICLES: No acute hemorrhage. No evidence of acute infarct. No extra-axial collection. No mass effect or midline shift. Patchy and confluent areas of decreased attenuation throughout the deep and periventricular white matter of the cerebral hemispheres bilaterally, compatible with chronic  microvascular ischemic disease. Cerebral ventricle sizes concordant with the degree of cerebral volume loss. Atherosclerotic calcifications within the cavernous internal carotid and vertebral arteries. ORBITS: No acute abnormality. Bilateral lens replacement. SINUSES: No acute abnormality. Left mastoid air cell effusion. SOFT TISSUES AND SKULL: No acute soft tissue abnormality. No skull fracture. IMPRESSION: 1. No acute intracranial abnormality related to the head trauma. 2. Chronic microvascular ischemic disease in the deep and periventricular white matter of the cerebral hemispheres bilaterally. 3. Cerebral volume loss with ventricle sizes concordant with the degree of volume loss. 4. Atherosclerotic calcifications within the cavernous internal carotid and vertebral arteries. 5. Left mastoid air cell effusion. Electronically signed by: Timothy Berrigan MD 12/05/2024 12:10 PM EST RP Workstation: [id number]        Scheduled Meds:  ferrous sulfate   325 mg Oral Q breakfast   finasteride   5 mg Oral Daily   multivitamin with minerals  1 tablet Oral Daily   pantoprazole  (PROTONIX ) IV  40 mg Intravenous Q12H   tamsulosin   0.4 mg Oral QPC breakfast   Warfarin - Pharmacist Dosing Inpatient  Does not apply q1600   Continuous Infusions:        Darcelle Herrada, MD [facility] 12/06/2024, 1:32 PM

## 2024-12-06 NOTE — Evaluation (Signed)
 Occupational Therapy Evaluation Patient Details Name: Robert Burch MRN: [id number] DOB: 14-Oct-1933 Today's Date: 12/06/2024   History of Present Illness   Robert Burch is a 89 y.o. male with medical history significant of A-fib on Coumadin , nonischemic cardiomyopathy, chronic anxiety, IBS, hypertension, GERD presented to the emergency department for evaluation of lightheadedness, fall.  Patient states that he had fallen 10 days ago, does have bilateral hip and knee pain, bruise over his thigh.  Today patient reported dizziness, lightheadedness before fall, fell front side, hit his head. All imaging negative for acute injury.     Clinical Impressions Patient presenting with decreased Ind in self care,balance, functional mobility, transfers, endurance, and safety awareness. Patient lives at home with wife and son. His son works during the day. Pt reports having several falls over the last two weeks. Pt ambulates with rollator at baseline and endorses self care Ind but wife assists if needed. Patient currently functioning at min- mod A of 2 for functional mobility and transfers. Pt needing 1 person assist to stand while second person assisted with hygiene and changing soiled brief. Pt ambulates short distance in room ~ 10' with RW and returning to bed secondary to fatigue. Pt is far from baseline.  Patient will benefit from acute OT to increase overall independence in the areas of ADLs, functional mobility, and safety awareness in order to safely discharge.     If plan is discharge home, recommend the following:   A lot of help with walking and/or transfers;A lot of help with bathing/dressing/bathroom;Help with stairs or ramp for entrance     Functional Status Assessment   Patient has had a recent decline in their functional status and demonstrates the ability to make significant improvements in function in a reasonable and predictable amount of time.     Equipment  Recommendations   Other (comment) (defer)      Precautions/Restrictions   Precautions Precautions: Fall Recall of Precautions/Restrictions: Impaired     Mobility Bed Mobility Overal bed mobility: Needs Assistance Bed Mobility: Supine to Sit     Supine to sit: Min assist, +2 for safety/equipment     General bed mobility comments: pain in left hip during bed mobility. Posterior leaning    Transfers Overall transfer level: Needs assistance Equipment used: Rolling walker (2 wheels) Transfers: Sit to/from Stand Sit to Stand: Min assist, +2 physical assistance, Mod assist                  Balance Overall balance assessment: Needs assistance Sitting-balance support: Feet supported Sitting balance-Leahy Scale: Poor   Postural control: Posterior lean Standing balance support: Bilateral upper extremity supported, During functional activity, Reliant on assistive device for balance Standing balance-Leahy Scale: Poor                             ADL either performed or assessed with clinical judgement   ADL Overall ADL's : Needs assistance/impaired                                       General ADL Comments: Min A to stand and another person to assist with hygiene and change soiled brief     Vision Patient Visual Report: No change from baseline              Pertinent Vitals/Pain Pain Assessment Pain Assessment: Faces Faces  Pain Scale: Hurts little more Pain Location: L hip with bed mobility Pain Descriptors / Indicators: Discomfort, Grimacing, Guarding Pain Intervention(s): Monitored during session, Repositioned     Extremity/Trunk Assessment Upper Extremity Assessment Upper Extremity Assessment: Generalized weakness   Lower Extremity Assessment Lower Extremity Assessment: Generalized weakness LLE: Unable to fully assess due to pain LLE Coordination: decreased gross motor   Cervical / Trunk Assessment Cervical / Trunk  Assessment: Normal   Communication Communication Communication: Impaired Factors Affecting Communication: Difficulty expressing self   Cognition Arousal: Alert Behavior During Therapy: WFL for tasks assessed/performed                                 Following commands: Impaired Following commands impaired: Follows one step commands inconsistently, Follows one step commands with increased time     Cueing  General Comments   Cueing Techniques: Verbal cues;Gestural cues;Tactile cues              Home Living Family/patient expects to be discharged to:: Private residence Living Arrangements: Spouse/significant other Available Help at Discharge: Family;Available PRN/intermittently Type of Home: House Home Access: Stairs to enter Entrance Stairs-Number of Steps: 3 Entrance Stairs-Rails: Right Home Layout: Laundry or work area in basement;Able to live on main level with bedroom/bathroom     Bathroom Shower/Tub: Walk-in shower;Other (comment)   Bathroom Toilet: Handicapped height     Home Equipment: Rollator (4 wheels);Wheelchair - manual          Prior Functioning/Environment Prior Level of Function : Needs assist;History of Falls (last six months)       Physical Assist : Mobility (physical);ADLs (physical)     Mobility Comments: frequent falls ADLs Comments: wife assists as needed for bathing    OT Problem List: Decreased strength;Decreased safety awareness;Decreased activity tolerance;Impaired balance (sitting and/or standing);Decreased knowledge of precautions   OT Treatment/Interventions: Self-care/ADL training;Therapeutic activities;Energy conservation;Patient/family education;Balance training;DME and/or AE instruction      OT Goals(Current goals can be found in the care plan section)   Acute Rehab OT Goals Patient Stated Goal: to get stronger OT Goal Formulation: With patient/family Time For Goal Achievement: 12/20/24 Potential to  Achieve Goals: Fair ADL Goals Pt Will Perform Grooming: with supervision;standing Pt Will Perform Lower Body Dressing: with supervision;sit to/from stand Pt Will Transfer to Toilet: with supervision;ambulating Pt Will Perform Toileting - Clothing Manipulation and hygiene: with supervision;sit to/from stand   OT Frequency:  Min 2X/week    Co-evaluation PT/OT/SLP Co-Evaluation/Treatment: Yes Reason for Co-Treatment: Necessary to address cognition/behavior during functional activity;For patient/therapist safety;To address functional/ADL transfers PT goals addressed during session: Mobility/safety with mobility;Balance;Proper use of DME OT goals addressed during session: ADL's and self-care      AM-PAC OT 6 Clicks Daily Activity     Outcome Measure Help from another person eating meals?: None Help from another person taking care of personal grooming?: A Little Help from another person toileting, which includes using toliet, bedpan, or urinal?: A Lot Help from another person bathing (including washing, rinsing, drying)?: A Little Help from another person to put on and taking off regular upper body clothing?: A Little Help from another person to put on and taking off regular lower body clothing?: A Lot 6 Click Score: 17   End of Session Nurse Communication: Mobility status  Activity Tolerance: Patient tolerated treatment well Patient left: in bed;with call bell/phone within reach;with family/visitor present  OT Visit Diagnosis: Unsteadiness on feet (R26.81);Repeated falls (R29.6);Muscle  weakness (generalized) (M62.81)                Time: 9090-9070 OT Time Calculation (min): 20 min Charges:  OT General Charges $OT Visit: 1 Visit OT Evaluation $OT Eval Moderate Complexity: 1 [address], [place], OTR/L , [facility]IS ascom [phone]  12/06/2024, 1:08 PM

## 2024-12-07 ENCOUNTER — Encounter: Payer: Self-pay | Admitting: Internal Medicine

## 2024-12-07 DIAGNOSIS — R55 Syncope and collapse: Secondary | ICD-10-CM | POA: Diagnosis not present

## 2024-12-07 DIAGNOSIS — K922 Gastrointestinal hemorrhage, unspecified: Secondary | ICD-10-CM | POA: Diagnosis not present

## 2024-12-07 DIAGNOSIS — K219 Gastro-esophageal reflux disease without esophagitis: Secondary | ICD-10-CM | POA: Diagnosis not present

## 2024-12-07 DIAGNOSIS — I480 Paroxysmal atrial fibrillation: Secondary | ICD-10-CM | POA: Diagnosis not present

## 2024-12-07 DIAGNOSIS — D649 Anemia, unspecified: Secondary | ICD-10-CM | POA: Diagnosis not present

## 2024-12-07 DIAGNOSIS — I10 Essential (primary) hypertension: Secondary | ICD-10-CM | POA: Diagnosis not present

## 2024-12-07 LAB — RETICULOCYTES
Immature Retic Fract: 29.5 % — ABNORMAL HIGH (ref 2.3–15.9)
RBC.: 3.08 MIL/uL — ABNORMAL LOW (ref 4.22–5.81)
Retic Count, Absolute: 74.2 K/uL (ref 19.0–186.0)
Retic Ct Pct: 2.4 % (ref 0.4–3.1)

## 2024-12-07 LAB — BLOOD GAS, VENOUS
Acid-Base Excess: 5.2 mmol/L — ABNORMAL HIGH (ref 0.0–2.0)
Bicarbonate: 29 mmol/L — ABNORMAL HIGH (ref 20.0–28.0)
O2 Saturation: 91.3 %
Patient temperature: 37
pCO2, Ven: 39 mmHg — ABNORMAL LOW (ref 44–60)
pH, Ven: 7.48 — ABNORMAL HIGH (ref 7.25–7.43)
pO2, Ven: 55 mmHg — ABNORMAL HIGH (ref 32–45)

## 2024-12-07 LAB — AMMONIA: Ammonia: 24 umol/L (ref 9–35)

## 2024-12-07 LAB — RENAL FUNCTION PANEL
Albumin: 3.4 g/dL — ABNORMAL LOW (ref 3.5–5.0)
Anion gap: 9 (ref 5–15)
BUN: 22 mg/dL (ref 8–23)
CO2: 25 mmol/L (ref 22–32)
Calcium: 8.1 mg/dL — ABNORMAL LOW (ref 8.9–10.3)
Chloride: 100 mmol/L (ref 98–111)
Creatinine, Ser: 0.97 mg/dL (ref 0.61–1.24)
GFR, Estimated: >60 mL/min (ref >60)
Glucose, Bld: 107 mg/dL — ABNORMAL HIGH (ref 70–99)
Phosphorus: 2.9 mg/dL (ref 2.5–4.6)
Potassium: 4.6 mmol/L (ref 3.5–5.1)
Sodium: 134 mmol/L — ABNORMAL LOW (ref 135–145)

## 2024-12-07 LAB — IRON AND TIBC
Iron: 24 ug/dL — ABNORMAL LOW (ref 45–182)
Saturation Ratios: 10 % — ABNORMAL LOW (ref 17.9–39.5)
TIBC: 235 ug/dL — ABNORMAL LOW (ref 250–450)
UIBC: 211 ug/dL

## 2024-12-07 LAB — PROTIME-INR
INR: 1.5 — ABNORMAL HIGH (ref 0.8–1.2)
Prothrombin Time: 18.5 s — ABNORMAL HIGH (ref 11.4–15.2)

## 2024-12-07 LAB — URINALYSIS, ROUTINE W REFLEX MICROSCOPIC
Bacteria, UA: NONE SEEN
Bilirubin Urine: NEGATIVE
Glucose, UA: NEGATIVE mg/dL
Hgb urine dipstick: NEGATIVE
Ketones, ur: NEGATIVE mg/dL
Nitrite: NEGATIVE
Protein, ur: NEGATIVE mg/dL
Specific Gravity, Urine: 1.018 (ref 1.005–1.030)
pH: 7 (ref 5.0–8.0)

## 2024-12-07 LAB — FERRITIN: Ferritin: 155 ng/mL (ref 24–336)

## 2024-12-07 LAB — VITAMIN B12: Vitamin B-12: 499 pg/mL (ref 180–914)

## 2024-12-07 LAB — FOLATE: Folate: 16.3 ng/mL (ref >5.9)

## 2024-12-07 LAB — MAGNESIUM: Magnesium: 2.3 mg/dL (ref 1.7–2.4)

## 2024-12-07 LAB — TSH: TSH: 2.91 u[IU]/mL (ref 0.350–4.500)

## 2024-12-07 MED ORDER — SODIUM CHLORIDE 0.9 % IV SOLN: INTRAVENOUS | Status: AC

## 2024-12-07 MED ORDER — PEG 3350-KCL-NA BICARB-NACL 420 G PO SOLR
4000.0000 mL | Freq: Once | ORAL | Status: DC
  Filled 2024-12-07: qty 4000

## 2024-12-07 NOTE — Plan of Care (Signed)

## 2024-12-07 NOTE — Progress Notes (Signed)
 Physical Therapy Treatment Patient Details Name: Robert Burch MRN: [id number] DOB: 1933-10-09 Today's Date: 12/07/2024   History of Present Illness Robert Burch is a 89 y.o. male with medical history significant of A-fib on Coumadin , nonischemic cardiomyopathy, chronic anxiety, IBS, hypertension, GERD presented to the emergency department for evaluation of lightheadedness, fall.  Patient states that he had fallen 10 days ago, does have bilateral hip and knee pain, bruise over his thigh.  Today patient reported dizziness, lightheadedness before fall, fell front side, hit his head. All imaging negative for acute injury.    PT Comments  Pt was asleep upon arrival. Does awake but takes awhile to become fully alert. He stays awake to participate but cognition remains altered throughout. Pt is only truly oriented to self. He is able to follow commands but needs constant vcs for desired task. Vcs and tcs throughout session however pt is able to perform all desired task with increased time. Pt's family does endorse severe concerns. MD came in during session to discuss. Overall pt tolerated OOB and ambulation well with use of RW. Pt will benefit form 24/7 assistance at DC. He will continue to benefit from skilled PT at DC to maximize independence and safety with all ADLs.     If plan is discharge home, recommend the following: A little help with walking and/or transfers;Assistance with cooking/housework;Assistance with feeding;Direct supervision/assist for medications management;Direct supervision/assist for financial management;Assist for transportation;Help with stairs or ramp for entrance;Supervision due to cognitive status;A little help with bathing/dressing/bathroom     Equipment Recommendations  None recommended by PT       Precautions / Restrictions Precautions Precautions: Fall Recall of Precautions/Restrictions: Intact Restrictions Weight Bearing Restrictions Per Provider  Order: No     Mobility  Bed Mobility Overal bed mobility: Needs Assistance Bed Mobility: Supine to Sit  Supine to sit: Min assist, Mod assist  General bed mobility comments: mostly needs assistance due to poor motor planning. once pt initiates movements, then does well    Transfers Overall transfer level: Needs assistance Equipment used: Rolling walker (2 wheels) Transfers: Sit to/from Stand Sit to Stand: Min assist  General transfer comment: vcs and tcs for handplacement and technique. again, styruggles to initiate but once movements started, does well to complete transfers (standing and sitting)    Ambulation/Gait Ambulation/Gait assistance: Min assist, +2 safety/equipment (2nd person to push IV pole) Gait Distance (Feet): 200 Feet Assistive device: Rolling walker (2 wheels) Gait Pattern/deviations: Step-through pattern, Narrow base of support Gait velocity: decr  General Gait Details: Vcs for wider BOS, pt tends to have tandem/narrow BOS      Balance Overall balance assessment: Needs assistance Sitting-balance support: Feet supported Sitting balance-Leahy Scale: Poor Sitting balance - Comments: pt has posterior LOB into bed one time however author does not feel its due to formal balance deficits but due to cognitiove deficits   Standing balance support: Bilateral upper extremity supported, During functional activity, Reliant on assistive device for balance Standing balance-Leahy Scale: Fair Standing balance comment: pt does well with ambulation with RW. Did ambulate a few ft without AD however antalgic step to pattern due to R knee pain    Communication Communication Communication: Impaired Factors Affecting Communication: Hearing impaired;Difficulty expressing self  Cognition Arousal: Alert Behavior During Therapy: WFL for tasks assessed/performed   PT - Cognitive impairments: Problem solving, Sequencing, Initiation, Awareness, Safety/Judgement   PT - Cognition  Comments: Pt is alert after being woken up but cognition continues to be poor with pt  only oriented x 1 Following commands: Intact Following commands impaired: Follows one step commands inconsistently, Follows one step commands with increased time    Cueing Cueing Techniques: Verbal cues, Tactile cues     General Comments General comments (skin integrity, edema, etc.): most of session spent eduvcating pt's spouse and son. MD came in during session for reinforcements on possible hoispital delirium      Pertinent Vitals/Pain Pain Assessment Pain Assessment: 0-10 Pain Score: 3  Pain Location: R knee Pain Descriptors / Indicators: Discomfort, Grimacing, Guarding Pain Intervention(s): Limited activity within patient's tolerance, Monitored during session, Premedicated before session, Repositioned     PT Goals (current goals can now be found in the care plan section) Acute Rehab PT Goals Patient Stated Goal: none stated Progress towards PT goals: Progressing toward goals    Frequency    Min 2X/week           Co-evaluation     PT goals addressed during session: Mobility/safety with mobility;Balance;Proper use of DME;Strengthening/ROM        AM-PAC PT 6 Clicks Mobility   Outcome Measure  Help needed turning from your back to your side while in a flat bed without using bedrails?: A Little Help needed moving from lying on your back to sitting on the side of a flat bed without using bedrails?: A Little Help needed moving to and from a bed to a chair (including a wheelchair)?: A Lot Help needed standing up from a chair using your arms (e.g., wheelchair or bedside chair)?: A Lot Help needed to walk in hospital room?: A Lot Help needed climbing 3-5 steps with a railing? : A Lot 6 Click Score: 14    End of Session   Activity Tolerance: Patient tolerated treatment well;Other (comment) (cognition greatly impacts session progression) Patient left: in chair;with call bell/phone  within reach;with nursing/sitter in room;with family/visitor present Nurse Communication: Mobility status PT Visit Diagnosis: Other abnormalities of gait and mobility (R26.89);Unsteadiness on feet (R26.81);Muscle weakness (generalized) (M62.81);Difficulty in walking, not elsewhere classified (R26.2);Repeated falls (R29.6);Pain Pain - Right/Left: Left Pain - part of body: Hip     Time: 8587-8550 PT Time Calculation (min) (ACUTE ONLY): 37 min  Charges:    $Gait Training: 8-22 mins $Therapeutic Activity: 23-37 mins PT General Charges $$ ACUTE PT VISIT: 1 Visit                    Rankin Essex PTA 12/07/2024, 4:03 PM

## 2024-12-07 NOTE — Progress Notes (Signed)
 Son [name] called to tell him of new room number. Patient moved to room 219 from 205A

## 2024-12-07 NOTE — Plan of Care (Signed)
 Notified by PT that patient's family is at bedside.   Therapy, wife and son at bedside.  Patient has no complaints. Patient is awake and alert and oriented to self and family but not place or time.  Basic encephalopathy labs including VBG, TSH, ammonia unrevealing.  Has no focal neurodeficit.  Family appreciated the update.

## 2024-12-07 NOTE — Progress Notes (Signed)
 Patient not wanting to drink the prep for colonoscopy. Patient now alert enough to take his morning medications. Family at bedside encouraging him to drink.

## 2024-12-07 NOTE — Progress Notes (Signed)
 Report given by night nurse that patient has become very confused overnight. He was placed in mits due to trying to pull iv out and pulling at things. Patient trying to get out of the bed constantly.

## 2024-12-07 NOTE — Progress Notes (Signed)
 Patient unable to drink prep for procedure.

## 2024-12-07 NOTE — TOC Progression Note (Signed)
 Transition of Care [facility]) - Progression Note    Patient Details  Name: Robert Burch MRN: [id number] Date of Birth: [birth year]  Transition of Care [facility]) CM/SW Contact  Corean ONEIDA Haddock, RN Phone Number: 12/07/2024, 2:47 PM  Clinical Narrative:      Patient A&Ox1 today Spoke with Wife Robert Burch, and son Robert Burch.  Reviews recs for SNF.  Provided bed offers to wife. CMS Medicare.gov Compare Post Acute Care list reviewed with wife. She accepts bed at [facility].  Selected in HUB and notified Darrian at [facility].  Per MD not medically appropriate to initiate auth at this time                   Expected Discharge Plan and Services                                               Social Drivers of Health (SDOH) Interventions SDOH Screenings   Food Insecurity: No Food Insecurity (12/06/2024)  Housing: Low Risk (12/06/2024)  Transportation Needs: No Transportation Needs (12/06/2024)  Utilities: Not At Risk (12/06/2024)  Financial Resource Strain: Low Risk  (08/10/2023)   Received from [facility]  Social Connections: Unknown (12/06/2024)  Tobacco Use: Low Risk  (12/01/2024)   Received from [facility]    Readmission Risk Interventions     No data to display

## 2024-12-07 NOTE — Progress Notes (Signed)
 PROGRESS NOTE  Robert Burch FMW:[id number] DOB: Feb 27, 1933   PCP: Glover Lenis, MD  Patient is from: Home?  DOA: 12/05/2024 LOS: 0  Chief complaints Chief Complaint  Patient presents with   Loss of Consciousness     Brief Narrative / Interim history: 89 year old M with PMH of A-fib on Coumadin , NICM, HTN, anxiety, IBS and GERD presented to ED with lightheadedness and recurrent falls, and admitted with symptomatic anemia.  In ED, stable vitals INR 3.6.  Hgb 8.7 (was 11.1 about a month ago).  Na 130.  CT head and cervical spine without acute finding.  Pelvic x-ray without acute finding.  EKG shows A-fib with rate at 72 bpm.  Hemoccult positive.  GI consulted and patient was admitted.  INR reversed by vitamin K .  Plan for EGD  Hospital course complicated by encephalopathy/delirium.    Subjective: Seen and examined earlier this morning.  No major events overnight or this morning.  Patient was confused overnight requiring bilateral mittens.  Sleepy this morning.  Withdraws all extremities to noxious stimuli.    Assessment and plan: Symptomatic acute blood loss anemia: Suspect upper GI bleed.  Hemoccult was positive.  INR 3.5>> 1.5.  H&H stable. Recent Labs    12/20/23 1518 10/29/24 1513 10/30/24 0335 12/05/24 1108 12/05/24 1735 12/06/24 0500 12/06/24 1850  HGB 12.6* 11.0* 11.1* 8.7* 8.7* 8.6* 8.5*  - Continue holding warfarin - Continue IV Protonix  - Monitor H&H, and transfuse for Hgb <7.0 - Check anemia panel - Plan for EGD on 12/19  Hospital delirium/encephalopathy: Confused requiring bilateral mittens last night.  Very sleepy this morning.  Withdraws all extremities to noxious stimuli.  No facial symmetry.  Initial CT head without acute finding -Discontinue morphine  -Basic encephalopathy labs including VBG, TSH, B12, ammonia and RPR -Minimize avoid sedating medications -Reorientation and delirium precaution   Paroxysmal atrial fibrillation: Rate  controlled.  On warfarin for anticoagulation -Continue holding warfarin in the setting of GI bleed -Optimize electrolyte   Hypertension: Normotensive.  Does not seem to be on meds at home. 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  On p.o. Lasix  40 mg daily.  Appears euvolemic on exam. -Continue holding Lasix  -Monitor fluid status   GERD: -Continue IV Protonix .   Hyponatremia -Check renal panel.   Obesity class I: Body mass index is 31.06 kg/m.          DVT prophylaxis:  SCDs Start: 12/05/24 1459  Code Status: Full code Family Communication: None at bedside Level of care: Telemetry Status is: Observation The patient will require care spanning > 2 midnights and should be moved to inpatient because: Encephalopathy, delirium, acute blood loss anemia and GI bleed   Final disposition: To be determined   55 minutes with more than 50% spent in reviewing records, counseling patient/family and coordinating care.  Consultants:  Gastroenterology  Procedures: None  Microbiology summarized: None  Objective: Vitals:   12/06/24 1736 12/06/24 2037 12/07/24 0322 12/07/24 0828  BP: (!) 114/55 117/66 (!) 158/92 113/63  Pulse: 98 (!) 55 69 63  Resp: 18 16 20 18   Temp: 97.7 F (36.5 C) 98.2 F (36.8 C) 98.2 F (36.8 C) 99.3 F (37.4 C)  TempSrc:  Oral    SpO2: 98% 98% 96% 91%  Weight:        Examination:  GENERAL: No apparent distress.  Nontoxic. HEENT: MMM.  Vision and hearing grossly intact.  NECK: Supple.  No apparent JVD.  RESP:  No IWOB.  Fair aeration  bilaterally. CVS:  RRR. Heart sounds normal.  ABD/GI/GU: BS+. Abd soft, NTND.  MSK/EXT:  Moves extremities. No apparent deformity.  Bilateral mittens. SKIN: no apparent skin lesion or wound NEURO: Very sleepy and somnolent.  Withdraws all extremities to noxious stimuli  Sch Meds:  Scheduled Meds:  ferrous sulfate   325 mg Oral Q breakfast   finasteride   5 mg Oral Daily   multivitamin  with minerals  1 tablet Oral Daily   pantoprazole  (PROTONIX ) IV  40 mg Intravenous Q12H   polyethylene glycol-electrolytes  4,000 mL Oral Once   tamsulosin   0.4 mg Oral QPC breakfast   Continuous Infusions:  sodium chloride  10 mL/hr at 12/07/24 1000   PRN Meds:.acetaminophen  **OR** acetaminophen , alum & mag hydroxide-simeth, hydrALAZINE , ondansetron  **OR** ondansetron  (ZOFRAN ) IV  Antimicrobials: Anti-infectives (From admission, onward)    None        I have personally reviewed the following labs and images: CBC: Recent Labs  Lab 12/05/24 1108 12/05/24 1735 12/06/24 0500 12/06/24 1850  WBC 10.7* 10.2 10.3 11.2*  HGB 8.7* 8.7* 8.6* 8.5*  HCT 26.0* 25.7* 26.2* 25.8*  MCV 83.1 81.3 83.2 82.7  PLT 279 277 279 290   BMP &GFR Recent Labs  Lab 12/01/24 1148 12/05/24 1108  NA  --  130*  K  --  4.2  CL  --  94*  CO2  --  26  GLUCOSE  --  86  BUN  --  23  CREATININE  --  1.04  CALCIUM   --  8.7*  MG 2.0  --    Estimated Creatinine Clearance: 49.9 mL/min (by C-G formula based on SCr of 1.04 mg/dL). Liver & Pancreas: Recent Labs  Lab 12/05/24 1108  AST 26  ALT 20  ALKPHOS 42  BILITOT 1.1  PROT 6.4*  ALBUMIN 4.0   No results for input(s): LIPASE, AMYLASE in the last 168 hours. No results for input(s): AMMONIA in the last 168 hours. Diabetic: No results for input(s): HGBA1C in the last 72 hours. No results for input(s): GLUCAP in the last 168 hours. Cardiac Enzymes: No results for input(s): CKTOTAL, CKMB, CKMBINDEX, TROPONINI in the last 168 hours. No results for input(s): PROBNP in the last 8760 hours. Coagulation Profile: Recent Labs  Lab 12/05/24 1108 12/06/24 0500 12/06/24 1328 12/07/24 0458  INR 3.6* 3.5* 3.2* 1.5*   Thyroid Function Tests: No results for input(s): TSH, T4TOTAL, FREET4, T3FREE, THYROIDAB in the last 72 hours. Lipid Profile: No results for input(s): CHOL, HDL, LDLCALC, TRIG, CHOLHDL,  LDLDIRECT in the last 72 hours. Anemia Panel: No results for input(s): VITAMINB12, FOLATE, FERRITIN, TIBC, IRON, RETICCTPCT in the last 72 hours. Urine analysis:    Component Value Date/Time   COLORURINE STRAW (A) 12/05/2024 1145   APPEARANCEUR CLEAR (A) 12/05/2024 1145   APPEARANCEUR Cloudy (A) 07/12/2024 0825   LABSPEC 1.005 12/05/2024 1145   PHURINE 7.0 12/05/2024 1145   GLUCOSEU NEGATIVE 12/05/2024 1145   HGBUR NEGATIVE 12/05/2024 1145   BILIRUBINUR NEGATIVE 12/05/2024 1145   BILIRUBINUR Negative 07/12/2024 0825   KETONESUR NEGATIVE 12/05/2024 1145   PROTEINUR NEGATIVE 12/05/2024 1145   NITRITE NEGATIVE 12/05/2024 1145   LEUKOCYTESUR NEGATIVE 12/05/2024 1145   Sepsis Labs: Invalid input(s): PROCALCITONIN, LACTICIDVEN  Microbiology: No results found for this or any previous visit (from the past 240 hours).  Radiology Studies: No results found.    Lanier Felty T. Inger Wiest [facility]  If 7PM-7AM, please contact night-coverage [email or web address] 12/07/2024, 1:33 PM

## 2024-12-08 ENCOUNTER — Telehealth (HOSPITAL_COMMUNITY): Payer: Self-pay

## 2024-12-08 ENCOUNTER — Encounter: Admission: EM | Payer: Self-pay | Source: Home / Self Care | Attending: Emergency Medicine

## 2024-12-08 ENCOUNTER — Observation Stay: Admitting: Anesthesiology

## 2024-12-08 ENCOUNTER — Other Ambulatory Visit (HOSPITAL_COMMUNITY): Payer: Self-pay

## 2024-12-08 DIAGNOSIS — K922 Gastrointestinal hemorrhage, unspecified: Secondary | ICD-10-CM | POA: Diagnosis not present

## 2024-12-08 DIAGNOSIS — I951 Orthostatic hypotension: Secondary | ICD-10-CM | POA: Diagnosis present

## 2024-12-08 DIAGNOSIS — E871 Hypo-osmolality and hyponatremia: Secondary | ICD-10-CM | POA: Diagnosis present

## 2024-12-08 DIAGNOSIS — I480 Paroxysmal atrial fibrillation: Secondary | ICD-10-CM | POA: Diagnosis present

## 2024-12-08 DIAGNOSIS — E782 Mixed hyperlipidemia: Secondary | ICD-10-CM | POA: Diagnosis not present

## 2024-12-08 DIAGNOSIS — T45515A Adverse effect of anticoagulants, initial encounter: Secondary | ICD-10-CM | POA: Diagnosis present

## 2024-12-08 DIAGNOSIS — Z882 Allergy status to sulfonamides status: Secondary | ICD-10-CM | POA: Diagnosis not present

## 2024-12-08 DIAGNOSIS — K5730 Diverticulosis of large intestine without perforation or abscess without bleeding: Secondary | ICD-10-CM | POA: Diagnosis present

## 2024-12-08 DIAGNOSIS — K31811 Angiodysplasia of stomach and duodenum with bleeding: Secondary | ICD-10-CM | POA: Diagnosis present

## 2024-12-08 DIAGNOSIS — Z79899 Other long term (current) drug therapy: Secondary | ICD-10-CM | POA: Diagnosis not present

## 2024-12-08 DIAGNOSIS — D649 Anemia, unspecified: Secondary | ICD-10-CM | POA: Diagnosis not present

## 2024-12-08 DIAGNOSIS — E785 Hyperlipidemia, unspecified: Secondary | ICD-10-CM | POA: Diagnosis present

## 2024-12-08 DIAGNOSIS — K219 Gastro-esophageal reflux disease without esophagitis: Secondary | ICD-10-CM | POA: Diagnosis present

## 2024-12-08 DIAGNOSIS — E66811 Obesity, class 1: Secondary | ICD-10-CM | POA: Diagnosis present

## 2024-12-08 DIAGNOSIS — F05 Delirium due to known physiological condition: Secondary | ICD-10-CM | POA: Diagnosis present

## 2024-12-08 DIAGNOSIS — D120 Benign neoplasm of cecum: Secondary | ICD-10-CM | POA: Diagnosis present

## 2024-12-08 DIAGNOSIS — Z7901 Long term (current) use of anticoagulants: Secondary | ICD-10-CM | POA: Diagnosis not present

## 2024-12-08 DIAGNOSIS — Z96641 Presence of right artificial hip joint: Secondary | ICD-10-CM | POA: Diagnosis present

## 2024-12-08 DIAGNOSIS — R55 Syncope and collapse: Secondary | ICD-10-CM | POA: Diagnosis present

## 2024-12-08 DIAGNOSIS — K5521 Angiodysplasia of colon with hemorrhage: Secondary | ICD-10-CM | POA: Diagnosis present

## 2024-12-08 DIAGNOSIS — D62 Acute posthemorrhagic anemia: Secondary | ICD-10-CM | POA: Diagnosis present

## 2024-12-08 DIAGNOSIS — N400 Enlarged prostate without lower urinary tract symptoms: Secondary | ICD-10-CM | POA: Diagnosis present

## 2024-12-08 DIAGNOSIS — I428 Other cardiomyopathies: Secondary | ICD-10-CM | POA: Diagnosis present

## 2024-12-08 DIAGNOSIS — R296 Repeated falls: Secondary | ICD-10-CM | POA: Diagnosis present

## 2024-12-08 DIAGNOSIS — D6832 Hemorrhagic disorder due to extrinsic circulating anticoagulants: Secondary | ICD-10-CM | POA: Diagnosis present

## 2024-12-08 DIAGNOSIS — Z6831 Body mass index (BMI) 31.0-31.9, adult: Secondary | ICD-10-CM | POA: Diagnosis not present

## 2024-12-08 DIAGNOSIS — Z7983 Long term (current) use of bisphosphonates: Secondary | ICD-10-CM | POA: Diagnosis not present

## 2024-12-08 DIAGNOSIS — I110 Hypertensive heart disease with heart failure: Secondary | ICD-10-CM | POA: Diagnosis present

## 2024-12-08 DIAGNOSIS — I10 Essential (primary) hypertension: Secondary | ICD-10-CM | POA: Diagnosis not present

## 2024-12-08 LAB — COMPREHENSIVE METABOLIC PANEL WITH GFR
ALT: 13 U/L (ref 0–44)
AST: 22 U/L (ref 15–41)
Albumin: 3.4 g/dL — ABNORMAL LOW (ref 3.5–5.0)
Alkaline Phosphatase: 42 U/L (ref 38–126)
Anion gap: 8 (ref 5–15)
BUN: 20 mg/dL (ref 8–23)
CO2: 25 mmol/L (ref 22–32)
Calcium: 8 mg/dL — ABNORMAL LOW (ref 8.9–10.3)
Chloride: 102 mmol/L (ref 98–111)
Creatinine, Ser: 0.93 mg/dL (ref 0.61–1.24)
GFR, Estimated: >60 mL/min
Glucose, Bld: 94 mg/dL (ref 70–99)
Potassium: 5 mmol/L (ref 3.5–5.1)
Sodium: 135 mmol/L (ref 135–145)
Total Bilirubin: 1.2 mg/dL (ref 0.0–1.2)
Total Protein: 5.6 g/dL — ABNORMAL LOW (ref 6.5–8.1)

## 2024-12-08 LAB — CBC
HCT: 26.3 % — ABNORMAL LOW (ref 39.0–52.0)
Hemoglobin: 8.6 g/dL — ABNORMAL LOW (ref 13.0–17.0)
MCH: 27.7 pg (ref 26.0–34.0)
MCHC: 32.7 g/dL (ref 30.0–36.0)
MCV: 84.8 fL (ref 80.0–100.0)
Platelets: 299 K/uL (ref 150–400)
RBC: 3.1 MIL/uL — ABNORMAL LOW (ref 4.22–5.81)
RDW: 16.8 % — ABNORMAL HIGH (ref 11.5–15.5)
WBC: 8.4 K/uL (ref 4.0–10.5)
nRBC: 0 % (ref 0.0–0.2)

## 2024-12-08 LAB — MAGNESIUM: Magnesium: 2.2 mg/dL (ref 1.7–2.4)

## 2024-12-08 LAB — PROTIME-INR
INR: 1.3 — ABNORMAL HIGH (ref 0.8–1.2)
Prothrombin Time: 16.6 s — ABNORMAL HIGH (ref 11.4–15.2)

## 2024-12-08 LAB — SYPHILIS: RPR W/REFLEX TO RPR TITER AND TREPONEMAL ANTIBODIES, TRADITIONAL SCREENING AND DIAGNOSIS ALGORITHM: RPR Ser Ql: NONREACTIVE

## 2024-12-08 SURGERY — EGD (ESOPHAGOGASTRODUODENOSCOPY)
Anesthesia: General

## 2024-12-08 MED ORDER — PROPOFOL 10 MG/ML IV BOLUS
INTRAVENOUS | Status: AC
  Filled 2024-12-08: qty 40

## 2024-12-08 MED ORDER — HYDRALAZINE HCL 25 MG PO TABS: 25.0000 mg | ORAL_TABLET | Freq: Four times a day (QID) | ORAL | Status: DC | PRN

## 2024-12-08 MED ORDER — LIDOCAINE HCL (PF) 2 % IJ SOLN
INTRAMUSCULAR | Status: AC
  Filled 2024-12-08: qty 5

## 2024-12-08 NOTE — Progress Notes (Signed)
 " PROGRESS NOTE  Robert Burch FMW:[id number] DOB: [birth year]   PCP: Glover Lenis, MD  Patient is from: Home?  DOA: 12/05/2024 LOS: 0  Chief complaints Chief Complaint  Patient presents with   Loss of Consciousness     Brief Narrative / Interim history: 89 year old M with PMH of A-fib on Coumadin , NICM, HTN, anxiety, IBS and GERD presented to ED with lightheadedness and recurrent falls, and admitted with symptomatic anemia.  In ED, stable vitals INR 3.6.  Hgb 8.7 (was 11.1 about a month ago).  Na 130.  CT head and cervical spine without acute finding.  Pelvic x-ray without acute finding.  EKG shows A-fib with rate at 72 bpm.  Hemoccult positive.  GI consulted and patient was admitted.  INR reversed by vitamin K .  Plan for EGD  Hospital course complicated by encephalopathy/delirium that is improving..    Subjective: Seen and examined earlier this morning.  No major events overnight or this morning.  No complaints.  Awake and alert and oriented x 4 except date.   Assessment and plan: Symptomatic acute blood loss anemia: Suspect upper GI bleed.  Hemoccult was positive.  INR 3.5>> 1.3.  H&H stable.  Anemia panel without significant nutritional deficiency. Recent Labs    12/20/23 1518 10/29/24 1513 10/30/24 0335 12/05/24 1108 12/05/24 1735 12/06/24 0500 12/06/24 1850 12/08/24 0449  HGB 12.6* 11.0* 11.1* 8.7* 8.7* 8.6* 8.5* 8.6*  - Continue holding warfarin.  Plan to start Eliquis  after GI evaluation. - Continue IV Protonix  - Monitor H&H, and transfuse for Hgb <7.0 - GI following.  Hospital delirium/encephalopathy: Could be due to IV morphine .  Resolving.  Currently awake and oriented x 4 except date.  Encephalopathy workup including CT head, UA, VBG, TSH, B12, ammonia and RPR unrevealing. -Minimize avoid sedating medications.  Morphine  discontinued. -Reorientation and delirium precaution   Paroxysmal atrial fibrillation: Rate controlled.  On warfarin for  anticoagulation.  CHA2DS2-VASc score 4. -Cardiology recommended switching warfarin to Eliquis  after GI evaluation -Optimize electrolyte -Appreciate input by cardiology.   NICM/HFmrEF-TTE n 11/11 with LVEF of 45 to 50%, normal DD.  On p.o. Lasix  40 mg daily.  Appears euvolemic -Continue holding Lasix  -Monitor fluid status  Essential hypertension: BP slightly elevated.  Not on meds at home. -P.o. hydralazine  as needed.  GERD - On Protonix .   Hyponatremia: Resolved.  History of BPH - Continue Proscar  and Flomax .   Obesity class I: Body mass index is 31.06 kg/m.          DVT prophylaxis:  SCDs Start: 12/05/24 1459  Code Status: Full code Family Communication: None at bedside Level of care: Telemetry Status is: Observation The patient will remain inpatient because: Encephalopathy, delirium, acute blood loss anemia and GI bleed   Final disposition: Likely home once medically stable   55 minutes with more than 50% spent in reviewing records, counseling patient/family and coordinating care.  Consultants:  Gastroenterology Cardiology  Procedures: None  Microbiology summarized: None  Objective: Vitals:   12/07/24 0322 12/07/24 0828 12/07/24 1647 12/08/24 0720  BP: (!) 158/92 113/63 (!) 114/55 (!) 155/79  Pulse: 69 63 (!) 45 (!) 58  Resp: 20 18 18 16   Temp: 98.2 F (36.8 C) 99.3 F (37.4 C) 98.5 F (36.9 C) 98.1 F (36.7 C)  TempSrc:      SpO2: 96% 91% 99% 100%  Weight:        Examination:  GENERAL: No apparent distress.  Nontoxic. HEENT: MMM.  Vision and hearing grossly intact.  NECK:  Supple.  No apparent JVD.  RESP:  No IWOB.  Fair aeration bilaterally. CVS: Irregular rhythm.  Normal rate.  Heart sounds normal.  ABD/GI/GU: BS+. Abd soft, NTND.  MSK/EXT:  Moves extremities. No apparent deformity.  SKIN: no apparent skin lesion or wound NEURO: Awake and alert.  Oriented x 4 except date.  Follows commands.  Sch Meds:  Scheduled Meds:  ferrous  sulfate  325 mg Oral Q breakfast   finasteride   5 mg Oral Daily   multivitamin with minerals  1 tablet Oral Daily   pantoprazole  (PROTONIX ) IV  40 mg Intravenous Q12H   polyethylene glycol-electrolytes  4,000 mL Oral Once   tamsulosin   0.4 mg Oral QPC breakfast   Continuous Infusions:   PRN Meds:.acetaminophen  **OR** acetaminophen , alum & mag hydroxide-simeth, hydrALAZINE , ondansetron  **OR** ondansetron  (ZOFRAN ) IV  Antimicrobials: Anti-infectives (From admission, onward)    None        I have personally reviewed the following labs and images: CBC: Recent Labs  Lab 12/05/24 1108 12/05/24 1735 12/06/24 0500 12/06/24 1850 12/08/24 0449  WBC 10.7* 10.2 10.3 11.2* 8.4  HGB 8.7* 8.7* 8.6* 8.5* 8.6*  HCT 26.0* 25.7* 26.2* 25.8* 26.3*  MCV 83.1 81.3 83.2 82.7 84.8  PLT 279 277 279 290 299   BMP &GFR Recent Labs  Lab 12/05/24 1108 12/07/24 1249 12/07/24 1257 12/08/24 0449  NA 130* 134*  --  135  K 4.2 4.6  --  5.0  CL 94* 100  --  102  CO2 26 25  --  25  GLUCOSE 86 107*  --  94  BUN 23 22  --  20  CREATININE 1.04 0.97  --  0.93  CALCIUM  8.7* 8.1*  --  8.0*  MG  --   --  2.3 2.2  PHOS  --  2.9  --   --    Estimated Creatinine Clearance: 55.8 mL/min (by C-G formula based on SCr of 0.93 mg/dL). Liver & Pancreas: Recent Labs  Lab 12/05/24 1108 12/07/24 1249 12/08/24 0449  AST 26  --  22  ALT 20  --  13  ALKPHOS 42  --  42  BILITOT 1.1  --  1.2  PROT 6.4*  --  5.6*  ALBUMIN 4.0 3.4* 3.4*   No results for input(s): LIPASE, AMYLASE in the last 168 hours. Recent Labs  Lab 12/07/24 1257  AMMONIA 24   Diabetic: No results for input(s): HGBA1C in the last 72 hours. No results for input(s): GLUCAP in the last 168 hours. Cardiac Enzymes: No results for input(s): CKTOTAL, CKMB, CKMBINDEX, TROPONINI in the last 168 hours. No results for input(s): PROBNP in the last 8760 hours. Coagulation Profile: Recent Labs  Lab 12/05/24 1108  12/06/24 0500 12/06/24 1328 12/07/24 0458 12/08/24 0449  INR 3.6* 3.5* 3.2* 1.5* 1.3*   Thyroid Function Tests: Recent Labs    12/07/24 1257  TSH 2.910   Lipid Profile: No results for input(s): CHOL, HDL, LDLCALC, TRIG, CHOLHDL, LDLDIRECT in the last 72 hours. Anemia Panel: Recent Labs    12/07/24 1249 12/07/24 1257 12/07/24 1258  VITAMINB12  --   --  499  FOLATE  --  16.3  --   FERRITIN  --  155  --   TIBC  --  235*  --   IRON  --  24*  --   RETICCTPCT 2.4  --   --    Urine analysis:    Component Value Date/Time   COLORURINE YELLOW (A) 12/07/2024 1439  APPEARANCEUR CLOUDY (A) 12/07/2024 1439   APPEARANCEUR Cloudy (A) 07/12/2024 0825   LABSPEC 1.018 12/07/2024 1439   PHURINE 7.0 12/07/2024 1439   GLUCOSEU NEGATIVE 12/07/2024 1439   HGBUR NEGATIVE 12/07/2024 1439   BILIRUBINUR NEGATIVE 12/07/2024 1439   BILIRUBINUR Negative 07/12/2024 0825   KETONESUR NEGATIVE 12/07/2024 1439   PROTEINUR NEGATIVE 12/07/2024 1439   NITRITE NEGATIVE 12/07/2024 1439   LEUKOCYTESUR SMALL (A) 12/07/2024 1439   Sepsis Labs: Invalid input(s): PROCALCITONIN, LACTICIDVEN  Microbiology: No results found for this or any previous visit (from the past 240 hours).  Radiology Studies: No results found.    Jacilyn Sanpedro T. Usman Millett [facility]  If 7PM-7AM, please contact night-coverage [email or web address] 12/08/2024, 12:48 PM   "

## 2024-12-08 NOTE — Plan of Care (Signed)

## 2024-12-08 NOTE — Telephone Encounter (Signed)
 Pharmacy Patient Advocate Encounter  Insurance verification completed.    The patient is insured through [facility]. Patient has Medicare and is not eligible for a copay card, but may be able to apply for patient assistance or Medicare RX Payment Plan (Patient Must reach out to their plan, if eligible for payment plan), if available.    Ran test claim for Eliquis  5mg  tablet and the current 30 day co-pay is $83.   This test claim was processed through [facility]- copay amounts may vary at other pharmacies due to [employer], or as the patient moves through the different stages of their insurance plan.

## 2024-12-08 NOTE — Consult Note (Signed)
 "  Cardiology Consultation   Patient ID: Robert Burch MRN: [id number]; DOB: 08/05/1933  Admit date: 12/05/2024 Date of Consult: 12/08/2024  PCP:  Glover Lenis, MD   [facility] Providers Cardiologist:  Evalene Lunger, MD  Electrophysiologist:  Elspeth Sage, MD (Inactive)       Patient Profile: Robert Burch is a 89 y.o. male with a hx of paroxysmal atrial fibrillation who is being seen 12/08/2024 for the evaluation of GI bleed and the need for long-term anticoagulation at the request of Dr. Kathrin.  History of Present Illness: Robert Burch is an 89 year old male with prolonged history of paroxysmal atrial fibrillation on anticoagulation with warfarin, nonischemic cardiomyopathy with mildly reduced ejection fraction, essential hypertension, GERD and chronic anxiety. The patient in the past had relatively low burden of atrial fibrillation and has been treated with flecainide  to be used as needed.  He has not required this in a while. He presented on the 16th with weakness and a fall and was found to be anemic with a drop in hemoglobin to 8.7 from 11.1 from the previous month.  He was guaiac positive.  His INR was elevated at 3.5 and was given vitamin K .  He was seen by GI with plans for endoscopic procedures. There was a question whether the patient needs to stay on long-term anticoagulation or not. He denies any chest pain, shortness of breath or palpitations. He is noted to be in atrial fibrillation during this admission but with controlled ventricular rate and he denies any symptoms related to this.   Past Medical History:  Diagnosis Date   A-fib [facility])    Atypical chest pain    a. Nonobstructive, very minor irregs by cath 2007;  b. 06/2009 Ex MV: small area of apical lateral ischemia;  c. 06/2009 Cath: essentially normal;  d. 04/2014 Lexiscan  MV: EF 59%, no ischemia/infarct->low risk.   Cancer (HCC)    Chronic anticoagulation    a. Chronic Coumadin     Chronic anxiety    Diverticulosis    Dysrhythmia    atrial fib   GERD (gastroesophageal reflux disease)    Hypertension    IBS (irritable bowel syndrome)    Nonischemic cardiomyopathy (HCC)    a. EF 45% by echo 2010;  b. 05/2012 Echo: EF 55-65%, no rwma, Gr 1 DD, mild AI/MR, mildly dil LA.   Paralyzed vocal cords    Paroxysmal atrial fibrillation (HCC)    a. Treated with flecainide    Proteinuria    History of    Past Surgical History:  Procedure Laterality Date   CARDIAC CATHETERIZATION     cataract surgery Bilateral    HIP ARTHROPLASTY Right 03/17/2020   Procedure: ARTHROPLASTY BIPOLAR HIP (HEMIARTHROPLASTY);  Surgeon: [name], MD;  Location: [facility] ORS;  Service: Orthopedics;  Laterality: Right;   MASS BIOPSY  2013     Home Medications:  Prior to Admission medications  Medication Sig Start Date End Date Taking? Authorizing Provider  acetaminophen  (TYLENOL ) 325 MG tablet Take 650 mg by mouth every 6 (six) hours as needed.   Yes [provider]  alendronate  (FOSAMAX ) 70 MG tablet Take 1 tablet (70 mg total) by mouth once a week. 11/16/23  Yes Caro Harlene POUR, NP  finasteride  (PROSCAR ) 5 MG tablet Take 1 tablet (5 mg total) by mouth daily. 01/06/24  Yes Francisca Redell BROCKS, MD  furosemide  (LASIX ) 40 MG tablet Take 1 tablet (40 mg total) by mouth daily. 11/10/24  Yes Donette Ellouise LABOR, FNP  magnesium   oxide (MAG-OX) 400 MG tablet Take 1 tablet (400 mg total) by mouth 2 (two) times daily. 08/15/24  Yes [name], Timothy J, MD  pantoprazole  (PROTONIX ) 20 MG tablet Take 20 mg by mouth daily. 07/31/24  Yes [provider]  tamsulosin  (FLOMAX ) 0.4 MG CAPS capsule One capsule po at bedtime (start on 10/07/23) 08/15/24  Yes [name], Timothy J, MD  warfarin (COUMADIN ) 2 MG tablet Take 2 mg by mouth daily. 11/17/23  Yes [provider]    Scheduled Meds:  ferrous sulfate   325 mg Oral Q breakfast   finasteride   5 mg Oral Daily   multivitamin with minerals  1 tablet Oral  Daily   pantoprazole  (PROTONIX ) IV  40 mg Intravenous Q12H   polyethylene glycol-electrolytes  4,000 mL Oral Once   tamsulosin   0.4 mg Oral QPC breakfast   Continuous Infusions:  PRN Meds: acetaminophen  **OR** acetaminophen , alum & mag hydroxide-simeth, hydrALAZINE , ondansetron  **OR** ondansetron  (ZOFRAN ) IV  Allergies:   Allergies[1]  Social History:   Social History   Socioeconomic History   Marital status: Married    Spouse name: Not on file   Number of children: 3   Years of education: Not on file   Highest education level: Not on file  Occupational History   Occupation: Art Gallery Manager from [employer]    Employer: RETIRED    Comment: Retired  Tobacco Use   Smoking status: Never   Smokeless tobacco: Never  Vaping Use   Vaping status: Never Used  Substance and Sexual Activity   Alcohol use: No    Alcohol/week: 2.0 standard drinks of alcohol    Types: 1 Glasses of wine, 1 Standard drinks or equivalent per week    Comment: occassionally   Drug use: No   Sexual activity: Not on file  Other Topics Concern   Not on file  Social History Narrative   Pt lives in [place] with his wife.  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[employer]   Social Drivers of Health   Tobacco Use: Low Risk  (12/01/2024)   Received from [facility]   Patient History    Smoking Tobacco Use: Never    Smokeless Tobacco Use: Never    Passive Exposure: Not on file  Financial Resource Strain: Low Risk  (08/10/2023)   Received from [facility]   Overall Financial Resource Strain (CARDIA)    Difficulty of Paying Living Expenses: Not hard at all  Food Insecurity: No Food Insecurity (12/06/2024)   Epic    Worried About Radiation Protection Practitioner of Food in the Last Year: Never true    Ran Out of Food in the Last Year: Never true  Transportation Needs: No Transportation Needs (12/06/2024)   Epic    Lack of Transportation (Medical): No    Lack of Transportation (Non-Medical): No  Physical  Activity: Not on file  Stress: Not on file  Social Connections: Unknown (12/06/2024)   Social Connection and Isolation Panel    Frequency of Communication with Friends and Family: Patient declined    Frequency of Social Gatherings with Friends and Family: Patient declined    Attends Religious Services: Patient declined    Database Administrator or Organizations: Patient declined    Attends Banker Meetings: Patient declined    Marital Status: Married  Catering Manager Violence: Not At Risk (12/06/2024)   Epic    Fear of Current or Ex-Partner: No    Emotionally Abused: No    Physically Abused: No  Sexually Abused: No  Depression (PHQ2-9): Not on file  Alcohol Screen: Not on file  Housing: Low Risk (12/06/2024)   Epic    Unable to Pay for Housing in the Last Year: No    Number of Times Moved in the Last Year: 0    Homeless in the Last Year: No  Utilities: Not At Risk (12/06/2024)   Epic    Threatened with loss of utilities: No  Health Literacy: Not on file    Family History:    Family History  Problem Relation Age of Onset   Heart attack Neg Hx      ROS:  Please see the history of present illness.   All other ROS reviewed and negative.     Physical Exam/Data: Vitals:   12/07/24 0322 12/07/24 0828 12/07/24 1647 12/08/24 0720  BP: (!) 158/92 113/63 (!) 114/55 (!) 155/79  Pulse: 69 63 (!) 45 (!) 58  Resp: 20 18 18 16   Temp: 98.2 F (36.8 C) 99.3 F (37.4 C) 98.5 F (36.9 C) 98.1 F (36.7 C)  TempSrc:      SpO2: 96% 91% 99% 100%  Weight:        Intake/Output Summary (Last 24 hours) at 12/08/2024 1248 Last data filed at 12/08/2024 1241 Gross per 24 hour  Intake 550 ml  Output 1160 ml  Net -610 ml      12/05/2024   11:00 AM 12/01/2024   10:49 AM 11/10/2024    1:50 PM  Last 3 Weights  Weight (lbs) 192 lb 7.4 oz 192 lb 9.6 oz 190 lb 3.2 oz  Weight (kg) 87.3 kg 87.363 kg 86.274 kg     Body mass index is 31.06 kg/m.  General:  Well  nourished, well developed, in no acute distress HEENT: normal Neck: no JVD Vascular: No carotid bruits; Distal pulses 2+ bilaterally Cardiac:  normal S1, S2; irregularly irregular; no murmur  Lungs:  clear to auscultation bilaterally, no wheezing, rhonchi or rales  Abd: soft, nontender, no hepatomegaly  Ext: no edema Musculoskeletal:  No deformities, BUE and BLE strength normal and equal Skin: warm and dry  Neuro:  CNs 2-12 intact, no focal abnormalities noted Psych:  Normal affect   EKG:  The EKG was personally reviewed and demonstrates: Atrial fibrillation with left bundle branch block Telemetry:  Telemetry was personally reviewed and demonstrates: Atrial fibrillation with controlled ventricular rate  Relevant CV Studies: Echocardiogram done in November of this year showed an EF of 45 to 50% with mild mitral regurgitation.  Laboratory Data: High Sensitivity Troponin:  No results for input(s): TROPONINIHS in the last 720 hours.  Recent Labs  Lab 12/05/24 1108  TRNPT 44*      Chemistry Recent Labs  Lab 12/05/24 1108 12/07/24 1249 12/07/24 1257 12/08/24 0449  NA 130* 134*  --  135  K 4.2 4.6  --  5.0  CL 94* 100  --  102  CO2 26 25  --  25  GLUCOSE 86 107*  --  94  BUN 23 22  --  20  CREATININE 1.04 0.97  --  0.93  CALCIUM  8.7* 8.1*  --  8.0*  MG  --   --  2.3 2.2  GFRNONAA >60 >60  --  >60  ANIONGAP 10 9  --  8    Recent Labs  Lab 12/05/24 1108 12/07/24 1249 12/08/24 0449  PROT 6.4*  --  5.6*  ALBUMIN 4.0 3.4* 3.4*  AST 26  --  22  ALT 20  --  13  ALKPHOS 42  --  42  BILITOT 1.1  --  1.2   Lipids No results for input(s): CHOL, TRIG, HDL, LABVLDL, LDLCALC, CHOLHDL in the last 168 hours.  Hematology Recent Labs  Lab 12/06/24 0500 12/06/24 1850 12/07/24 1249 12/08/24 0449  WBC 10.3 11.2*  --  8.4  RBC 3.15* 3.12* 3.08* 3.10*  HGB 8.6* 8.5*  --  8.6*  HCT 26.2* 25.8*  --  26.3*  MCV 83.2 82.7  --  84.8  MCH 27.3 27.2  --  27.7  MCHC  32.8 32.9  --  32.7  RDW 15.7* 15.9*  --  16.8*  PLT 279 290  --  299   Thyroid  Recent Labs  Lab 12/07/24 1257  TSH 2.910    BNPNo results for input(s): BNP, PROBNP in the last 168 hours.  DDimer No results for input(s): DDIMER in the last 168 hours.  Radiology/Studies:  DG Pelvis 1-2 Views Result Date: 12/05/2024 EXAM: 1 or 2 VIEW(S) XRAY OF THE PELVIS 12/05/2024 12:16:00 PM COMPARISON: Comparison with 03/17/2020. CLINICAL HISTORY: Hip pain after a fall. FINDINGS: BONES AND JOINTS: Postoperative changes with right hip hemiarthroplasty. The femoral stem is not completely included within the field of view. The visualized portion of the arthroplasty appears well seated. No acute fracture or dislocation. The pelvis appears intact. SI joints and symphysis pubis are not displaced. Degenerative changes in the lower lumbar spine. SOFT TISSUES: The soft tissues are unremarkable. IMPRESSION: 1. No acute fracture or dislocation. 2. Right hip hemiarthroplasty with the visualized portion appearing well seated. Electronically signed by: Elsie Gravely MD 12/05/2024 12:34 PM EST RP Workstation: [id number]   CT Cervical Spine Wo Contrast Result Date: 12/05/2024 EXAM: CT CERVICAL SPINE WITHOUT CONTRAST 12/05/2024 12:06:56 PM TECHNIQUE: CT of the cervical spine was performed without the administration of intravenous contrast. Multiplanar reformatted images are provided for review. Automated exposure control, iterative reconstruction, and/or weight based adjustment of the mA/kV was utilized to reduce the radiation dose to as low as reasonably achievable. COMPARISON: CT of the cervical spine dated 03/16/2020. CLINICAL HISTORY: Neck trauma (Age >= 65y). FINDINGS: BONES AND ALIGNMENT: There is no evidence of acute traumatic injury. There is slight degenerative anterolisthesis at C5-C6. There is a mild levoscoliotic curvature of the cervical spine. The left facets at C2-C3 are fused and the right facets at  C3-C4 are fused. DEGENERATIVE CHANGES: There is moderate bilateral facet arthrosis. SOFT TISSUES: No prevertebral soft tissue swelling. IMPRESSION: 1. No evidence of acute traumatic injury. 2. 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 3. Slight degenerative event of listhesis at C5-6. 4. Fused left facets at C2-3 and right facets at C3-4. 5. Moderate bilateral facet arthrosis. Electronically signed by: Evalene Coho MD 12/05/2024 12:13 PM EST RP Workstation: [id number]   CT HEAD WO CONTRAST Result Date: 12/05/2024 EXAM: CT HEAD WITHOUT CONTRAST 12/05/2024 12:06:56 PM TECHNIQUE: CT of the head was performed without the administration of intravenous contrast. Automated exposure control, iterative reconstruction, and/or weight based adjustment of the mA/kV was utilized to reduce the radiation dose to as low as reasonably achievable. COMPARISON: CT of the head dated 03/16/2020. CLINICAL HISTORY: Head trauma, minor (Age >= 65y). FINDINGS: BRAIN AND VENTRICLES: No acute hemorrhage. No evidence of acute infarct. No extra-axial collection. No mass effect or midline shift. Patchy and confluent areas of decreased attenuation throughout the deep and periventricular white matter of the cerebral hemispheres bilaterally, compatible with chronic microvascular ischemic disease. Cerebral ventricle sizes  concordant with the degree of cerebral volume loss. Atherosclerotic calcifications within the cavernous internal carotid and vertebral arteries. ORBITS: No acute abnormality. Bilateral lens replacement. SINUSES: No acute abnormality. Left mastoid air cell effusion. SOFT TISSUES AND SKULL: No acute soft tissue abnormality. No skull fracture. IMPRESSION: 1. No acute intracranial abnormality related to the head trauma. 2. Chronic microvascular ischemic disease in the deep and periventricular white matter of the cerebral hemispheres bilaterally. 3. Cerebral volume loss with ventricle sizes concordant with the degree of  volume loss. 4. Atherosclerotic calcifications within the cavernous internal carotid and vertebral arteries. 5. Left mastoid air cell effusion. Electronically signed by: Evalene Coho MD 12/05/2024 12:10 PM EST RP Workstation: [id number]     Assessment and Plan: Paroxysmal atrial fibrillation: Prolonged history of paroxysmal atrial fibrillation and he is mostly in sinus rhythm.  However, he is noted to be in atrial fibrillation during this admission but ventricular rate is controlled and he is completely asymptomatic.  His CHADS2 VASc score is 4.  Thus, I think he is at high risk for cardioembolic complications without anticoagulation in spite of his presentation with what seems to be GI bleed.  The patient has been on warfarin for a while and for most of the time his INR is in the therapeutic range but he has occasional trends outside of the therapeutic range and this time he came with an INR of 3.6 with blood loss anemia due to GI bleed.  I discussed with him the alternatives to warfarin and recommend switching to apixaban  5 mg twice daily.  Continue to hold warfarin for now.  Eliquis  can be started once he is done with endoscopic procedures and cleared to start anticoagulation from a bleeding standpoint.  The patient's ventricular rate seems to be controlled without medications.  No plans for cardioversion as he seems to be asymptomatic. History of nonischemic cardiomyopathy with an EF of 45 to 50%: No evidence of volume overload.   For questions or updates, please contact [facility] Please consult [email or web address] for contact info under      Signed, Deatrice Cage, MD  12/08/2024 12:48 PM     [1]  Allergies Allergen Reactions   Ciprofloxacin     Diarrhea and stomach pains   Lanoxin [Digoxin]    Lopressor [Metoprolol Tartrate]    Penicillins     Other reaction(s): Unknown   Sulfonamide Derivatives     Rash    Verapamil    Sulfa Antibiotics Rash   "

## 2024-12-08 NOTE — Progress Notes (Signed)
 Occupational Therapy Treatment Patient Details Name: Robert Burch MRN: [id number] DOB: March 20, 1933 Today's Date: 12/08/2024   History of present illness Thorsten Climer is a 89 y.o. male with medical history significant of A-fib on Coumadin , nonischemic cardiomyopathy, chronic anxiety, IBS, hypertension, GERD presented to the emergency department for evaluation of lightheadedness, fall.  Patient states that he had fallen 10 days ago, does have bilateral hip and knee pain, bruise over his thigh.  Today patient reported dizziness, lightheadedness before fall, fell front side, hit his head. All imaging negative for acute injury.   OT comments  Patient seen for OT treatment on this date. Upon arrival to room patient resting in bed with spouse present, agreeable to treatment. Focus of treatment was on improving functional independence with ADLs, see below for scores. Patient presents with deficits in motor planning, sequencing task, recall and problem solving. He tolerated standing at sink ~3 minutes while performing grooming tasks with constant steadying A.  Patient ended treatment in bed with bed/chair alarm on and all needs within reach. Patient making good progress toward goals, will continue to follow POC. Discharge recommendation remains appropriate.        If plan is discharge home, recommend the following:  A lot of help with walking and/or transfers;A lot of help with bathing/dressing/bathroom;Help with stairs or ramp for entrance   Equipment Recommendations  Other (comment)    Recommendations for Other Services      Precautions / Restrictions Precautions Precautions: Fall Recall of Precautions/Restrictions: Intact Restrictions Weight Bearing Restrictions Per Provider Order: No       Mobility Bed Mobility Overal bed mobility: Needs Assistance Bed Mobility: Supine to Sit     Supine to sit: Min assist, Contact guard     General bed mobility comments: cues for  positioning/sequencing    Transfers Overall transfer level: Needs assistance Equipment used: Rolling walker (2 wheels) Transfers: Sit to/from Stand Sit to Stand: Min assist           General transfer comment: deficits noted with motor planning with in room ambulation/transfers     Balance Overall balance assessment: Needs assistance Sitting-balance support: Feet supported Sitting balance-Leahy Scale: Good     Standing balance support: Bilateral upper extremity supported, During functional activity, Reliant on assistive device for balance Standing balance-Leahy Scale: Fair Standing balance comment: patient required steadying A while standing at sink for grooming tasks; L posterior lean and requires cues to correct                           ADL either performed or assessed with clinical judgement   ADL Overall ADL's : Needs assistance/impaired     Grooming: Minimal assistance;Standing Grooming Details (indicate cue type and reason): steadying A while standing to brush teeth, cues for sequencing/task initiation             Lower Body Dressing: Contact guard assist;Sitting/lateral leans Lower Body Dressing Details (indicate cue type and reason): able to doff/don B socks with increased time and effort                    Extremity/Trunk Assessment Upper Extremity Assessment Upper Extremity Assessment: Overall WFL for tasks assessed   Lower Extremity Assessment Lower Extremity Assessment: Defer to PT evaluation        Vision       Perception     Praxis     Communication Communication Communication: Impaired Factors Affecting Communication: Hearing  impaired;Difficulty expressing self   Cognition Arousal: Alert Behavior During Therapy: WFL for tasks assessed/performed Cognition: History of cognitive impairments             OT - Cognition Comments: disoriented to situation, limited attention to task, problem solving and sequencing noted  during standing grooming tasks                 Following commands: Impaired Following commands impaired: Follows one step commands with increased time      Cueing   Cueing Techniques: Verbal cues, Tactile cues  Exercises      Shoulder Instructions       General Comments educated spouse on importance of allowing patient time/attempts to perform ADLs in safe position to promote functional independence/improve mobility- wife agreeable    Pertinent Vitals/ Pain       Pain Assessment Pain Assessment: No/denies pain  Home Living                                          Prior Functioning/Environment              Frequency  Min 2X/week        Progress Toward Goals  OT Goals(current goals can now be found in the care plan section)  Progress towards OT goals: Progressing toward goals  Acute Rehab OT Goals Patient Stated Goal: to go home OT Goal Formulation: With patient/family Time For Goal Achievement: 12/20/24 Potential to Achieve Goals: Fair ADL Goals Pt Will Perform Grooming: with supervision;standing Pt Will Perform Lower Body Dressing: with supervision;sit to/from stand Pt Will Transfer to Toilet: with supervision;ambulating Pt Will Perform Toileting - Clothing Manipulation and hygiene: with supervision;sit to/from stand  Plan      Co-evaluation                 AM-PAC OT 6 Clicks Daily Activity     Outcome Measure   Help from another person eating meals?: None Help from another person taking care of personal grooming?: A Little Help from another person toileting, which includes using toliet, bedpan, or urinal?: A Lot Help from another person bathing (including washing, rinsing, drying)?: A Little Help from another person to put on and taking off regular upper body clothing?: A Little Help from another person to put on and taking off regular lower body clothing?: A Lot 6 Click Score: 17    End of Session Equipment  Utilized During Treatment: Gait belt;Rolling walker (2 wheels)  OT Visit Diagnosis: Unsteadiness on feet (R26.81);Repeated falls (R29.6);Muscle weakness (generalized) (M62.81)   Activity Tolerance Patient tolerated treatment well   Patient Left in bed;with call bell/phone within reach;with family/visitor present   Nurse Communication Mobility status        Time: 8459-8395 OT Time Calculation (min): 24 min  Charges: OT General Charges $OT Visit: 1 Visit OT Treatments $Self Care/Home Management : 8-22 mins  Rogers Clause, OT/L MSOT, 12/08/2024

## 2024-12-09 DIAGNOSIS — K922 Gastrointestinal hemorrhage, unspecified: Secondary | ICD-10-CM | POA: Diagnosis not present

## 2024-12-09 DIAGNOSIS — I480 Paroxysmal atrial fibrillation: Secondary | ICD-10-CM | POA: Diagnosis not present

## 2024-12-09 DIAGNOSIS — D649 Anemia, unspecified: Secondary | ICD-10-CM | POA: Diagnosis not present

## 2024-12-09 DIAGNOSIS — I10 Essential (primary) hypertension: Secondary | ICD-10-CM | POA: Diagnosis not present

## 2024-12-09 LAB — RENAL FUNCTION PANEL
Albumin: 3.5 g/dL (ref 3.5–5.0)
Anion gap: 6 (ref 5–15)
BUN: 16 mg/dL (ref 8–23)
CO2: 26 mmol/L (ref 22–32)
Calcium: 8 mg/dL — ABNORMAL LOW (ref 8.9–10.3)
Chloride: 104 mmol/L (ref 98–111)
Creatinine, Ser: 0.79 mg/dL (ref 0.61–1.24)
GFR, Estimated: >60 mL/min
Glucose, Bld: 101 mg/dL — ABNORMAL HIGH (ref 70–99)
Phosphorus: 2.6 mg/dL (ref 2.5–4.6)
Potassium: 4.7 mmol/L (ref 3.5–5.1)
Sodium: 136 mmol/L (ref 135–145)

## 2024-12-09 LAB — CBC
HCT: 28.5 % — ABNORMAL LOW (ref 39.0–52.0)
Hemoglobin: 9.2 g/dL — ABNORMAL LOW (ref 13.0–17.0)
MCH: 27.5 pg (ref 26.0–34.0)
MCHC: 32.3 g/dL (ref 30.0–36.0)
MCV: 85.1 fL (ref 80.0–100.0)
Platelets: 333 K/uL (ref 150–400)
RBC: 3.35 MIL/uL — ABNORMAL LOW (ref 4.22–5.81)
RDW: 17 % — ABNORMAL HIGH (ref 11.5–15.5)
WBC: 8.1 K/uL (ref 4.0–10.5)
nRBC: 0 % (ref 0.0–0.2)

## 2024-12-09 LAB — MAGNESIUM: Magnesium: 2.2 mg/dL (ref 1.7–2.4)

## 2024-12-09 MED ORDER — POLYETHYLENE GLYCOL 3350 17 GM/SCOOP PO POWD
119.0000 g | Freq: Once | ORAL | Status: AC
  Administered 2024-12-09: 119 g via ORAL
  Filled 2024-12-09: qty 119

## 2024-12-09 MED ORDER — POLYETHYLENE GLYCOL 3350 17 GM/SCOOP PO POWD
119.0000 g | Freq: Once | ORAL | Status: AC
  Administered 2024-12-10: 119 g via ORAL
  Filled 2024-12-09: qty 119

## 2024-12-09 MED ORDER — SODIUM CHLORIDE 0.9 % IV SOLN: INTRAVENOUS | Status: DC

## 2024-12-09 NOTE — Plan of Care (Signed)

## 2024-12-09 NOTE — Progress Notes (Signed)
 Mobility Specialist Progress Note:    12/09/24 0839  Mobility  Activity Stood at bedside;Pivoted/transferred from bed to chair;Ambulated with assistance  Level of Assistance Minimal assist, patient does 75% or more  Assistive Device  (HHA)  Distance Ambulated (ft) 6 ft  Range of Motion/Exercises Active;All extremities  Activity Response Tolerated well  Mobility visit 1 Mobility  Mobility Specialist Start Time (ACUTE ONLY) 0830  Mobility Specialist Stop Time (ACUTE ONLY) 0840  Mobility Specialist Time Calculation (min) (ACUTE ONLY) 10 min   Responding to bed alarm, pt standing at EOB upon arrival. Required redirection and reorientation, oriented to self only. Assisted to chair with MinA and hand held assist. Alarm on and belongings in reach. NT at bedside, all needs met.  Sherrilee Ditty Mobility Specialist Please contact via Special Educational Needs Teacher or  Rehab office at [phone]

## 2024-12-09 NOTE — Progress Notes (Signed)
"  °  °  [name], MD [facility]   [address] [place], [place] [address] Phone: [phone] Fax : [phone]   Subjective: The patient appears more alert today than he was yesterday.  He is joined by his wife and his son today.  They also report that he is better today but not to 100%.  Son reports him to be about 50% of his previous mentation.  The patient has had no sign of any further GI bleeding.  He has taken half of the prep but was unable to finish the prep due to his decreased mentation.   Objective: Vital signs in last 24 hours: Vitals:   12/08/24 0720 12/08/24 1515 12/08/24 2000 12/09/24 0511  BP: (!) 155/79 (!) 152/77 (!) 159/68 (!) 156/90  Pulse: (!) 58 (!) 54 (!) 53 60  Resp: 16 18 18 18   Temp: 98.1 F (36.7 C) (!) 97.5 F (36.4 C) 97.8 F (36.6 C) 97.9 F (36.6 C)  TempSrc:      SpO2: 100% 100% 97% 100%  Weight:       Weight change:   Intake/Output Summary (Last 24 hours) at 12/09/2024 0830 Last data filed at 12/09/2024 0500 Gross per 24 hour  Intake 300 ml  Output 375 ml  Net -75 ml     Exam: Heart:: Regular rate and rhythm or without murmur or extra heart sounds Lungs: normal and clear to auscultation and percussion Abdomen: soft, nontender, normal bowel sounds   Lab Results: @LABTEST2 @ Micro Results: No results found for this or any previous visit (from the past 240 hours). Studies/Results: No results found. Medications: I have reviewed the patient's current medications. Scheduled Meds:  ferrous sulfate   325 mg Oral Q breakfast   finasteride   5 mg Oral Daily   multivitamin with minerals  1 tablet Oral Daily   pantoprazole  (PROTONIX ) IV  40 mg Intravenous Q12H   polyethylene glycol-electrolytes  4,000 mL Oral Once   tamsulosin   0.4 mg Oral QPC breakfast   Continuous Infusions: PRN Meds:.acetaminophen  **OR** acetaminophen , alum & mag hydroxide-simeth, hydrALAZINE , ondansetron  **OR** ondansetron  (ZOFRAN ) IV   Assessment: Principal  Problem:   Symptomatic anemia Active Problems:   Essential hypertension   Syncope and collapse   Paroxysmal atrial fibrillation (HCC)   GERD (gastroesophageal reflux disease)   BPH (benign prostatic hyperplasia)   Hyperlipidemia   GI bleed    Plan: This patient appears to be improving from a cognitive standpoint but continues to be somewhat slower than usual.  The patient took half the prep was not able to finish the prep due to his mentation.  The Coumadin  has been stopped and the timing of a GI workup will depend on whether or not the patient is going to need to be on Coumadin  long-term.  I have requested the hospitalist to consult cardiology for restratification and further management of anticoagulation.  If the patient continues to improve he may undergo his GI workup while inpatient otherwise it may be deferred to outpatient.  Dr. [name] will be covering for the weekend.    [name], MD.FACG 12/09/2024, 8:30 AM Pager [phone] 7am-5pm  Check AMION for 5pm -7am coverage and on weekends  "

## 2024-12-09 NOTE — Progress Notes (Signed)
 " PROGRESS NOTE  Robert Burch FMW:[id number] DOB: [birth year]   PCP: Glover Lenis, MD  Patient is from: Home?  DOA: 12/05/2024 LOS: 1  Chief complaints Chief Complaint  Patient presents with   Loss of Consciousness     Brief Narrative / Interim history: 89 year old M with PMH of A-fib on Coumadin , NICM, HTN, anxiety, IBS and GERD presented to ED with lightheadedness and recurrent falls, and admitted with symptomatic anemia.  In ED, stable vitals INR 3.6.  Hgb 8.7 (was 11.1 about a month ago).  Na 130.  CT head and cervical spine without acute finding.  Pelvic x-ray without acute finding.  EKG shows A-fib with rate at 72 bpm.  Hemoccult positive.  GI consulted and patient was admitted.  INR reversed by vitamin K . Cardiology recommended Eliquis  once able to resume anticoagulation.  Hospital course complicated by encephalopathy/delirium that delayed endoscopic evaluation.  Delirium seems to have resolved now. GI planning EGD/colonoscopy on 12/21     Subjective: Seen and examined earlier this morning.  No major events overnight or this morning.  No complaints.  Awake and alert and oriented x 4.  Sitting on bedside chair.  Recalls his discussion with cardiology from yesterday about anticoagulation  Assessment and plan: Symptomatic acute blood loss anemia: Suspect upper GI bleed.  Hemoccult was positive.  INR 3.5>> 1.3.  H&H stable.  Anemia panel without significant nutritional deficiency. Recent Labs    12/20/23 1518 10/29/24 1513 10/30/24 0335 12/05/24 1108 12/05/24 1735 12/06/24 0500 12/06/24 1850 12/08/24 0449 12/09/24 0418  HGB 12.6* 11.0* 11.1* 8.7* 8.7* 8.6* 8.5* 8.6* 9.2*  - Continue holding warfarin.  Plan to start Eliquis  after GI evaluation. - Continue IV Protonix  - Monitor H&H, and transfuse for Hgb <7.0 - GI following.  Hospital delirium/encephalopathy: Could be due to IV morphine .  Resolving.  Currently awake and oriented x 4 except date.   Encephalopathy workup including CT head, UA, VBG, TSH, B12, ammonia and RPR unrevealing. -Minimize avoid sedating medications.  Morphine  discontinued. -Reorientation and delirium precaution   Paroxysmal atrial fibrillation: Rate controlled.  On warfarin for anticoagulation.  CHA2DS2-VASc score 4. -Cardiology recommended switching warfarin to Eliquis  after GI evaluation -Optimize electrolyte -Appreciate input by cardiology.   NICM/HFmrEF-TTE n 11/11 with LVEF of 45 to 50%, normal DD.  On p.o. Lasix  40 mg daily.  Appears euvolemic -Continue holding Lasix  -Monitor fluid status  Essential hypertension: BP slightly elevated.  Not on meds at home. -P.o. hydralazine  as needed.  GERD - On Protonix .   Hyponatremia: Resolved.  History of BPH - Continue Proscar  and Flomax .   Obesity class I: Body mass index is 31.06 kg/m.          DVT prophylaxis:  SCDs Start: 12/05/24 1459  Code Status: Full code Family Communication: None at bedside Level of care: Telemetry Status is: Observation The patient will remain inpatient because: Encephalopathy, delirium, acute blood loss anemia and GI bleed   Final disposition: Likely home once medically stable   35 minutes with more than 50% spent in reviewing records, counseling patient/family and coordinating care.  Consultants:  Gastroenterology Cardiology  Procedures: None  Microbiology summarized: None  Objective: Vitals:   12/08/24 1515 12/08/24 2000 12/09/24 0511 12/09/24 0935  BP: (!) 152/77 (!) 159/68 (!) 156/90 (!) 142/64  Pulse: (!) 54 (!) 53 60 99  Resp: 18 18 18 16   Temp: (!) 97.5 F (36.4 C) 97.8 F (36.6 C) 97.9 F (36.6 C) 97.7 F (36.5 C)  TempSrc:  Oral  SpO2: 100% 97% 100% 99%  Weight:        Examination:  GENERAL: No apparent distress.  Nontoxic. HEENT: MMM.  Vision and hearing grossly intact.  NECK: Supple.  No apparent JVD.  RESP:  No IWOB.  Fair aeration bilaterally. CVS: Irregular rhythm.   Normal rate.  Heart sounds normal.  ABD/GI/GU: BS+. Abd soft, NTND.  MSK/EXT:  Moves extremities. No apparent deformity.  SKIN: no apparent skin lesion or wound NEURO: Awake and alert.  Oriented x 4 except date.  Follows commands.  Sch Meds:  Scheduled Meds:  ferrous sulfate   325 mg Oral Q breakfast   finasteride   5 mg Oral Daily   multivitamin with minerals  1 tablet Oral Daily   pantoprazole  (PROTONIX ) IV  40 mg Intravenous Q12H   polyethylene glycol-electrolytes  4,000 mL Oral Once   tamsulosin   0.4 mg Oral QPC breakfast   Continuous Infusions:   PRN Meds:.acetaminophen  **OR** acetaminophen , alum & mag hydroxide-simeth, hydrALAZINE , ondansetron  **OR** ondansetron  (ZOFRAN ) IV  Antimicrobials: Anti-infectives (From admission, onward)    None        I have personally reviewed the following labs and images: CBC: Recent Labs  Lab 12/05/24 1735 12/06/24 0500 12/06/24 1850 12/08/24 0449 12/09/24 0418  WBC 10.2 10.3 11.2* 8.4 8.1  HGB 8.7* 8.6* 8.5* 8.6* 9.2*  HCT 25.7* 26.2* 25.8* 26.3* 28.5*  MCV 81.3 83.2 82.7 84.8 85.1  PLT 277 279 290 299 333   BMP &GFR Recent Labs  Lab 12/05/24 1108 12/07/24 1249 12/07/24 1257 12/08/24 0449 12/09/24 0418  NA 130* 134*  --  135 136  K 4.2 4.6  --  5.0 4.7  CL 94* 100  --  102 104  CO2 26 25  --  25 26  GLUCOSE 86 107*  --  94 101*  BUN 23 22  --  20 16  CREATININE 1.04 0.97  --  0.93 0.79  CALCIUM  8.7* 8.1*  --  8.0* 8.0*  MG  --   --  2.3 2.2 2.2  PHOS  --  2.9  --   --  2.6   Estimated Creatinine Clearance: 64.8 mL/min (by C-G formula based on SCr of 0.79 mg/dL). Liver & Pancreas: Recent Labs  Lab 12/05/24 1108 12/07/24 1249 12/08/24 0449 12/09/24 0418  AST 26  --  22  --   ALT 20  --  13  --   ALKPHOS 42  --  42  --   BILITOT 1.1  --  1.2  --   PROT 6.4*  --  5.6*  --   ALBUMIN 4.0 3.4* 3.4* 3.5   No results for input(s): LIPASE, AMYLASE in the last 168 hours. Recent Labs  Lab 12/07/24 1257   AMMONIA 24   Diabetic: No results for input(s): HGBA1C in the last 72 hours. No results for input(s): GLUCAP in the last 168 hours. Cardiac Enzymes: No results for input(s): CKTOTAL, CKMB, CKMBINDEX, TROPONINI in the last 168 hours. No results for input(s): PROBNP in the last 8760 hours. Coagulation Profile: Recent Labs  Lab 12/05/24 1108 12/06/24 0500 12/06/24 1328 12/07/24 0458 12/08/24 0449  INR 3.6* 3.5* 3.2* 1.5* 1.3*   Thyroid Function Tests: Recent Labs    12/07/24 1257  TSH 2.910   Lipid Profile: No results for input(s): CHOL, HDL, LDLCALC, TRIG, CHOLHDL, LDLDIRECT in the last 72 hours. Anemia Panel: Recent Labs    12/07/24 1249 12/07/24 1257 12/07/24 1258  VITAMINB12  --   --  499  FOLATE  --  16.3  --   FERRITIN  --  155  --   TIBC  --  235*  --   IRON  --  24*  --   RETICCTPCT 2.4  --   --    Urine analysis:    Component Value Date/Time   COLORURINE YELLOW (A) 12/07/2024 1439   APPEARANCEUR CLOUDY (A) 12/07/2024 1439   APPEARANCEUR Cloudy (A) 07/12/2024 0825   LABSPEC 1.018 12/07/2024 1439   PHURINE 7.0 12/07/2024 1439   GLUCOSEU NEGATIVE 12/07/2024 1439   HGBUR NEGATIVE 12/07/2024 1439   BILIRUBINUR NEGATIVE 12/07/2024 1439   BILIRUBINUR Negative 07/12/2024 0825   KETONESUR NEGATIVE 12/07/2024 1439   PROTEINUR NEGATIVE 12/07/2024 1439   NITRITE NEGATIVE 12/07/2024 1439   LEUKOCYTESUR SMALL (A) 12/07/2024 1439   Sepsis Labs: Invalid input(s): PROCALCITONIN, LACTICIDVEN  Microbiology: No results found for this or any previous visit (from the past 240 hours).  Radiology Studies: No results found.    Keoshia Steinmetz T. Sandralee Tarkington [facility]  If 7PM-7AM, please contact night-coverage [email or web address] 12/09/2024, 12:53 PM   "

## 2024-12-09 NOTE — Progress Notes (Signed)
" ° °  Ruel Kung , MD [address], [address], [place], [place], [address] Phone: [phone] Fax: [phone]   Robert Burch is being followed for anemia normocytic  Subjective: Doing welll , he drank most of prep yesterday under impression he wpuld have procedures today . Denies any bleeding    Objective: Vital signs in last 24 hours: Vitals:   12/08/24 1515 12/08/24 2000 12/09/24 0511 12/09/24 0935  BP: (!) 152/77 (!) 159/68 (!) 156/90 (!) 142/64  Pulse: (!) 54 (!) 53 60 99  Resp: 18 18 18 16   Temp: (!) 97.5 F (36.4 C) 97.8 F (36.6 C) 97.9 F (36.6 C) 97.7 F (36.5 C)  TempSrc:    Oral  SpO2: 100% 97% 100% 99%  Weight:       Weight change:   Intake/Output Summary (Last 24 hours) at 12/09/2024 1146 Last data filed at 12/09/2024 0500 Gross per 24 hour  Intake 300 ml  Output 375 ml  Net -75 ml     Exam: Heart:: S1S2 present or without murmur or extra heart sounds Lungs: normal Abdomen: soft, nontender, normal bowel sounds   Lab Results: @LABTEST2 @ Micro Results: No results found for this or any previous visit (from the past 240 hours). Studies/Results: No results found. Medications: I have reviewed the patient's current medications. Scheduled Meds:  ferrous sulfate   325 mg Oral Q breakfast   finasteride   5 mg Oral Daily   multivitamin with minerals  1 tablet Oral Daily   pantoprazole  (PROTONIX ) IV  40 mg Intravenous Q12H   polyethylene glycol-electrolytes  4,000 mL Oral Once   tamsulosin   0.4 mg Oral QPC breakfast   Continuous Infusions: PRN Meds:.acetaminophen  **OR** acetaminophen , alum & mag hydroxide-simeth, hydrALAZINE , ondansetron  **OR** ondansetron  (ZOFRAN ) IV   Assessment: Principal Problem:   Symptomatic anemia Active Problems:   Essential hypertension   Syncope and collapse   Paroxysmal atrial fibrillation (HCC)   GERD (gastroesophageal reflux disease)   BPH (benign prostatic hyperplasia)   Hyperlipidemia   GI  bleed  Robert Burch 89 y.o. male with paroxysmal atrial fibrillation on anticoagulation with warfarin was found to be anemic and GI has been consulted for evaluation. normal ferritin iron and TIBC indicative of iron anemia of chronic disease B12 499 hemoglobin has been low for about a month was 11.1 in November hemoglobin was 8.6 on 12/08/2024.  INR yesterday was 1.3.  Plan: Clear liquid diet till 5 PM complete bowel prep this evening and EGD and colonoscopy tomorrow   I have discussed alternative options, risks & benefits,  which include, but are not limited to, bleeding, infection, perforation,respiratory complication & drug reaction.  The patient agrees with this plan & written consent will be obtained.      LOS: 1 day   Ruel Kung, MD 12/09/2024, 11:46 AM  "

## 2024-12-10 ENCOUNTER — Encounter
Admission: EM | Disposition: A | Discharge status: Skilled Nursing Facility | Payer: Self-pay | Source: Home / Self Care | Attending: Hospitalist

## 2024-12-10 ENCOUNTER — Encounter: Payer: Self-pay | Admitting: Internal Medicine

## 2024-12-10 ENCOUNTER — Inpatient Hospital Stay: Admitting: Certified Registered Nurse Anesthetist

## 2024-12-10 ENCOUNTER — Encounter: Admitting: Certified Registered Nurse Anesthetist

## 2024-12-10 DIAGNOSIS — K922 Gastrointestinal hemorrhage, unspecified: Secondary | ICD-10-CM | POA: Diagnosis not present

## 2024-12-10 DIAGNOSIS — I10 Essential (primary) hypertension: Secondary | ICD-10-CM | POA: Diagnosis not present

## 2024-12-10 DIAGNOSIS — D649 Anemia, unspecified: Secondary | ICD-10-CM | POA: Diagnosis not present

## 2024-12-10 DIAGNOSIS — I480 Paroxysmal atrial fibrillation: Secondary | ICD-10-CM | POA: Diagnosis not present

## 2024-12-10 HISTORY — PX: HOT HEMOSTASIS: SHX5433

## 2024-12-10 HISTORY — PX: HEMOSTASIS CLIP PLACEMENT: SHX6857

## 2024-12-10 HISTORY — PX: ESOPHAGOGASTRODUODENOSCOPY: SHX5428

## 2024-12-10 HISTORY — PX: COLONOSCOPY: SHX5424

## 2024-12-10 HISTORY — PX: POLYPECTOMY: SHX149

## 2024-12-10 LAB — RENAL FUNCTION PANEL
Albumin: 3.5 g/dL (ref 3.5–5.0)
Anion gap: 7 (ref 5–15)
BUN: 12 mg/dL (ref 8–23)
CO2: 24 mmol/L (ref 22–32)
Calcium: 8 mg/dL — ABNORMAL LOW (ref 8.9–10.3)
Chloride: 106 mmol/L (ref 98–111)
Creatinine, Ser: 0.81 mg/dL (ref 0.61–1.24)
GFR, Estimated: >60 mL/min
Glucose, Bld: 118 mg/dL — ABNORMAL HIGH (ref 70–99)
Phosphorus: 2.1 mg/dL — ABNORMAL LOW (ref 2.5–4.6)
Potassium: 4.3 mmol/L (ref 3.5–5.1)
Sodium: 137 mmol/L (ref 135–145)

## 2024-12-10 LAB — CBC
HCT: 27.7 % — ABNORMAL LOW (ref 39.0–52.0)
Hemoglobin: 9 g/dL — ABNORMAL LOW (ref 13.0–17.0)
MCH: 27.7 pg (ref 26.0–34.0)
MCHC: 32.5 g/dL (ref 30.0–36.0)
MCV: 85.2 fL (ref 80.0–100.0)
Platelets: 351 K/uL (ref 150–400)
RBC: 3.25 MIL/uL — ABNORMAL LOW (ref 4.22–5.81)
RDW: 16.8 % — ABNORMAL HIGH (ref 11.5–15.5)
WBC: 7.3 K/uL (ref 4.0–10.5)
nRBC: 0 % (ref 0.0–0.2)

## 2024-12-10 LAB — MAGNESIUM: Magnesium: 2.1 mg/dL (ref 1.7–2.4)

## 2024-12-10 SURGERY — EGD (ESOPHAGOGASTRODUODENOSCOPY)
Anesthesia: General

## 2024-12-10 MED ORDER — PROPOFOL 500 MG/50ML IV EMUL
INTRAVENOUS | Status: DC | PRN
  Administered 2024-12-10: 150 ug/kg/min via INTRAVENOUS

## 2024-12-10 MED ORDER — SODIUM CHLORIDE 0.9 % IV SOLN: INTRAVENOUS | Status: DC

## 2024-12-10 MED ORDER — GLYCOPYRROLATE 0.2 MG/ML IJ SOLN
INTRAMUSCULAR | Status: AC
  Filled 2024-12-10: qty 1

## 2024-12-10 MED ORDER — PHENYLEPHRINE 80 MCG/ML (10ML) SYRINGE FOR IV PUSH (FOR BLOOD PRESSURE SUPPORT)
PREFILLED_SYRINGE | INTRAVENOUS | Status: DC | PRN
  Administered 2024-12-10: 80 ug via INTRAVENOUS

## 2024-12-10 MED ORDER — DICLOFENAC SODIUM 1 % EX GEL
2.0000 g | Freq: Four times a day (QID) | CUTANEOUS | Status: DC
  Administered 2024-12-10 – 2024-12-11 (×4): 2 g via TOPICAL
  Filled 2024-12-10: qty 100

## 2024-12-10 MED ORDER — LIDOCAINE HCL (PF) 2 % IJ SOLN
INTRAMUSCULAR | Status: AC
  Filled 2024-12-10: qty 10

## 2024-12-10 MED ORDER — LIDOCAINE HCL (CARDIAC) PF 100 MG/5ML IV SOSY
PREFILLED_SYRINGE | INTRAVENOUS | Status: DC | PRN
  Administered 2024-12-10: 100 mg via INTRAVENOUS

## 2024-12-10 MED ORDER — PANTOPRAZOLE SODIUM 40 MG PO TBEC
40.0000 mg | DELAYED_RELEASE_TABLET | Freq: Every day | ORAL | Status: DC
  Administered 2024-12-11 – 2024-12-12 (×2): 40 mg via ORAL
  Filled 2024-12-10 (×2): qty 1

## 2024-12-10 MED ORDER — EPHEDRINE SULFATE-NACL 50-0.9 MG/10ML-% IV SOSY
PREFILLED_SYRINGE | INTRAVENOUS | Status: DC | PRN
  Administered 2024-12-10 (×2): 5 mg via INTRAVENOUS
  Administered 2024-12-10: 10 mg via INTRAVENOUS

## 2024-12-10 MED ORDER — GLYCOPYRROLATE 0.2 MG/ML IJ SOLN
INTRAMUSCULAR | Status: DC | PRN
  Administered 2024-12-10 (×2): .1 mg via INTRAVENOUS

## 2024-12-10 MED ORDER — PROPOFOL 1000 MG/100ML IV EMUL
INTRAVENOUS | Status: AC
  Filled 2024-12-10: qty 100

## 2024-12-10 MED ORDER — PROPOFOL 10 MG/ML IV BOLUS
INTRAVENOUS | Status: DC | PRN
  Administered 2024-12-10: 60 mg via INTRAVENOUS

## 2024-12-10 NOTE — Op Note (Signed)
 [facility] Gastroenterology Patient Name: Robert Burch Procedure Date: 12/10/2024 9:22 AM MRN: [id number] Account #: [id number] Date of Birth: 1933/06/10 Admit Type: Inpatient Age: 89 Room: [facility] ENDO ROOM 4 Gender: Male Note Status: Finalized Instrument Name: Peds Colonoscope [id number] Procedure:             Colonoscopy Indications:           Iron deficiency anemia secondary to chronic blood loss Providers:             Ruel Kung MD, MD Referring MD:          Alm HERO. Glover, MD (Referring MD) Medicines:             Monitored Anesthesia Care Complications:         No immediate complications. Procedure:             Pre-Anesthesia Assessment:                        - Prior to the procedure, a History and Physical was                         performed, and patient medications, allergies and                         sensitivities were reviewed. The patient's tolerance                         of previous anesthesia was reviewed.                        - The risks and benefits of the procedure and the                         sedation options and risks were discussed with the                         patient. All questions were answered and informed                         consent was obtained.                        - ASA Grade Assessment: II - A patient with mild                         systemic disease.                        After obtaining informed consent, the colonoscope was                         passed under direct vision. Throughout the procedure,                         the patient's blood pressure, pulse, and oxygen                         saturations were monitored continuously.The  colonoscopy was technically difficult and complex due                         to a tortuous colon. Successful completion of the                         procedure was aided by withdrawing the scope and                         replacing with the  pediatric colonoscope. The                         Colonoscope was introduced through the and advanced to                         the the cecum, identified by the appendiceal orifice. Findings:      The perianal and digital rectal examinations were normal.      A 3 mm polyp was found in the cecum. The polyp was sessile. The polyp       was removed with a cold biopsy forceps. Resection and retrieval were       complete.      Four small localized angioectasias without bleeding were found in the       ascending colon. Coagulation for bleeding prevention using argon beam at       0.5 liters/minute and 20 watts was successful.      Two sessile polyps were found in the rectum and ascending colon. The       polyps were 4 to 5 mm in size. These polyps were removed with a cold       snare. Resection and retrieval were complete.      A 5 mm polyp was found in the sigmoid colon. The polyp was sessile. The       polyp was removed with a cold snare. Resection and retrieval were       complete.      Multiple medium-mouthed diverticula were found in the entire colon.      The exam was otherwise without abnormality on direct and retroflexion       views. Impression:            - One 3 mm polyp in the cecum, removed with a cold                         biopsy forceps. Resected and retrieved.                        - Four non-bleeding colonic angioectasias. Treated                         with argon beam coagulation.                        - Two 4 to 5 mm polyps in the rectum and in the                         ascending colon, removed with a cold snare. Resected  and retrieved.                        - One 5 mm polyp in the sigmoid colon, removed with a                         cold snare. Resected and retrieved.                        - Diverticulosis in the entire examined colon.                        - The examination was otherwise normal on direct and                          retroflexion views. Recommendation:        - Return patient to hospital ward for ongoing care.                        - Advance diet as tolerated.                        - Continue present medications.                        - Await pathology results.                        - Repeat colonoscopy is not recommended due to current                         age (98 years or older) for surveillance.                        - Restart coumdin tomorow, consider outpatient capsuel                         study of the small bowel Procedure Code(s):     --- Professional ---                        [phone], 59, Colonoscopy, flexible; with control of                         bleeding, any method                        45385, Colonoscopy, flexible; with removal of                         tumor(s), polyp(s), or other lesion(s) by snare                         technique                        45380, 59, Colonoscopy, flexible; with biopsy, single                         or multiple Diagnosis Code(s):     --- Professional ---  D12.0, Benign neoplasm of cecum                        D12.5, Benign neoplasm of sigmoid colon                        K55.20, Angiodysplasia of colon without hemorrhage                        D12.8, Benign neoplasm of rectum                        D12.2, Benign neoplasm of ascending colon                        D50.0, Iron deficiency anemia secondary to blood loss                         (chronic)                        K57.30, Diverticulosis of large intestine without                         perforation or abscess without bleeding CPT copyright 2022 American [employer] Association. All rights reserved. The codes documented in this report are preliminary and upon coder review may  be revised to meet current compliance requirements. Ruel Kung, MD Ruel Kung MD, MD 12/10/2024 11:03:10 AM This report has been signed electronically. Number of Addenda: 0 Note Initiated  On: 12/10/2024 9:22 AM Scope Withdrawal Time: 0 hours 13 minutes 41 seconds  Total Procedure Duration: 0 hours 28 minutes 44 seconds  Estimated Blood Loss:  Estimated blood loss: none.      [facility]

## 2024-12-10 NOTE — H&P (Signed)
 "  Ruel Kung , MD [address], [address], [place], [place], [address] Phone: [phone] Fax: [phone]  Primary Care Physician:  Glover Lenis, MD   Pre-Procedure History & Physical: HPI:  Robert Burch is a 89 y.o. male is here for an endoscopy and colonoscopy    Past Medical History:  Diagnosis Date   A-fib [facility])    Atypical chest pain    a. Nonobstructive, very minor irregs by cath 2007;  b. 06/2009 Ex MV: small area of apical lateral ischemia;  c. 06/2009 Cath: essentially normal;  d. 04/2014 Lexiscan  MV: EF 59%, no ischemia/infarct->low risk.   Cancer (HCC)    Chronic anticoagulation    a. Chronic Coumadin    Chronic anxiety    Diverticulosis    Dysrhythmia    atrial fib   GERD (gastroesophageal reflux disease)    Hypertension    IBS (irritable bowel syndrome)    Nonischemic cardiomyopathy (HCC)    a. EF 45% by echo 2010;  b. 05/2012 Echo: EF 55-65%, no rwma, Gr 1 DD, mild AI/MR, mildly dil LA.   Paralyzed vocal cords    Paroxysmal atrial fibrillation (HCC)    a. Treated with flecainide    Proteinuria    History of    Past Surgical History:  Procedure Laterality Date   CARDIAC CATHETERIZATION     cataract surgery Bilateral    HIP ARTHROPLASTY Right 03/17/2020   Procedure: ARTHROPLASTY BIPOLAR HIP (HEMIARTHROPLASTY);  Surgeon: [name], MD;  Location: [facility] ORS;  Service: Orthopedics;  Laterality: Right;   MASS BIOPSY  2013    Prior to Admission medications  Medication Sig Start Date End Date Taking? Authorizing Provider  acetaminophen  (TYLENOL ) 325 MG tablet Take 650 mg by mouth every 6 (six) hours as needed.   Yes [provider]  alendronate  (FOSAMAX ) 70 MG tablet Take 1 tablet (70 mg total) by mouth once a week. 11/16/23  Yes Caro Harlene POUR, NP  finasteride  (PROSCAR ) 5 MG tablet Take 1 tablet (5 mg total) by mouth daily. 01/06/24  Yes Francisca Redell BROCKS, MD  furosemide  (LASIX ) 40 MG tablet Take 1 tablet (40 mg total) by mouth daily.  11/10/24  Yes Hackney, Ellouise A, FNP  magnesium  oxide (MAG-OX) 400 MG tablet Take 1 tablet (400 mg total) by mouth 2 (two) times daily. 08/15/24  Yes [name], Timothy J, MD  pantoprazole  (PROTONIX ) 20 MG tablet Take 20 mg by mouth daily. 07/31/24  Yes [provider]  tamsulosin  (FLOMAX ) 0.4 MG CAPS capsule One capsule po at bedtime (start on 10/07/23) 08/15/24  Yes [name], Timothy J, MD  warfarin (COUMADIN ) 2 MG tablet Take 2 mg by mouth daily. 11/17/23  Yes [provider]    Allergies as of 12/05/2024 - Review Complete 12/05/2024  Allergen Reaction Noted   Ciprofloxacin  10/12/2018   Lanoxin [digoxin]  03/07/2013   Lopressor [metoprolol tartrate]  03/07/2013   Penicillins  04/08/2015   Sulfonamide derivatives     Verapamil  03/07/2013   Sulfa antibiotics Rash 03/16/2014    Family History  Problem Relation Age of Onset   Heart attack Neg Hx     Social History   Socioeconomic History   Marital status: Married    Spouse name: Not on file   Number of children: 3   Years of education: Not on file   Highest education level: Not on file  Occupational History   Occupation: Art Gallery Manager from [employer]    Employer: RETIRED    Comment: Retired  Tobacco Use   Smoking status: Never   Smokeless tobacco: Never  Vaping Use   Vaping status: Never Used  Substance and Sexual Activity   Alcohol use: No    Alcohol/week: 2.0 standard drinks of alcohol    Types: 1 Glasses of wine, 1 Standard drinks or equivalent per week    Comment: occassionally   Drug use: No   Sexual activity: Not on file  Other Topics Concern   Not on file  Social History Narrative   Pt lives in [place] with his wife.  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[employer]   Social Drivers of Health   Tobacco Use: Low Risk (12/10/2024)   Patient History    Smoking Tobacco Use: Never    Smokeless Tobacco Use: Never    Passive Exposure: Not on file  Financial Resource Strain: Low Risk  (08/10/2023)   Received from [facility]   Overall Financial Resource Strain (CARDIA)    Difficulty of Paying Living Expenses: Not hard at all  Food Insecurity: No Food Insecurity (12/06/2024)   Epic    Worried About Radiation Protection Practitioner of Food in the Last Year: Never true    Ran Out of Food in the Last Year: Never true  Transportation Needs: No Transportation Needs (12/06/2024)   Epic    Lack of Transportation (Medical): No    Lack of Transportation (Non-Medical): No  Physical Activity: Not on file  Stress: Not on file  Social Connections: Unknown (12/06/2024)   Social Connection and Isolation Panel    Frequency of Communication with Friends and Family: Patient declined    Frequency of Social Gatherings with Friends and Family: Patient declined    Attends Religious Services: Patient declined    Database Administrator or Organizations: Patient declined    Attends Banker Meetings: Patient declined    Marital Status: Married  Catering Manager Violence: Not At Risk (12/06/2024)   Epic    Fear of Current or Ex-Partner: No    Emotionally Abused: No    Physically Abused: No    Sexually Abused: No  Depression (PHQ2-9): Not on file  Alcohol Screen: Not on file  Housing: Low Risk (12/06/2024)   Epic    Unable to Pay for Housing in the Last Year: No    Number of Times Moved in the Last Year: 0    Homeless in the Last Year: No  Utilities: Not At Risk (12/06/2024)   Epic    Threatened with loss of utilities: No  Health Literacy: Not on file    Review of Systems: See HPI, otherwise negative ROS  Physical Exam: BP (!) 152/79   Pulse 65   Temp (!) 97 F (36.1 C) (Temporal)   Resp 16   Ht 5' 9 (1.753 m)   Wt 87.3 kg   SpO2 100%   BMI 28.42 kg/m  General:   Alert,  pleasant and cooperative in NAD Head:  Normocephalic and atraumatic. Neck:  Supple; no masses or thyromegaly. Lungs:  Clear throughout to auscultation, normal respiratory effort.    Heart:  +S1, +S2, Regular rate and  rhythm, No edema. Abdomen:  Soft, nontender and nondistended. Normal bowel sounds, without guarding, and without rebound.   Neurologic:  Alert and  oriented x4;  grossly normal neurologically.  Impression/Plan: Robert Burch is here for an endoscopy and colonoscopy  to be performed for  evaluation of anemia    Risks, benefits, limitations, and alternatives regarding endoscopy have  been reviewed with the patient.  Questions have been answered.  All parties agreeable.   Ruel Kung, MD  12/10/2024, 10:08 AM  "

## 2024-12-10 NOTE — Op Note (Signed)
 [facility]astroenter[facility] Patient Name: Robert Burch Procedure Date: 12/10/2024 9:25 AM MRN: [id number] Account #: [id number] Date of Birth: 1933/07/25 Admit Type: Inpatient Age: 89 Room: [facility] ENDO ROOM 4 Gender: Male Note Status: Finalized Instrument Name: Upper GI Scope [id number] Procedure:             Upper GI endoscopy Indications:           Iron deficiency anemia secondary to chronic blood loss Providers:             Ruel Kung MD, MD Referring MD:          Alm HERO. Glover, MD (Referring MD) Medicines:             Monitored Anesthesia Care Complications:         No immediate complications. Procedure:             Pre-Anesthesia Assessment:                        - Prior to the procedure, a History and Physical was                         performed, and patient medications, allergies and                         sensitivities were reviewed. The patient's tolerance                         of previous anesthesia was reviewed.                        - The risks and benefits of the procedure and the                         sedation options and risks were discussed with the                         patient. All questions were answered and informed                         consent was obtained.                        - ASA Grade Assessment: II - A patient with mild                         systemic disease.                        After obtaining informed consent, the endoscope was                         passed under direct vision. Throughout the procedure,                         the patient's blood pressure, pulse, and oxygen                         saturations were monitored continuously. The Endoscope  was introduced through the mouth, and advanced to the                         third part of duodenum. The upper GI endoscopy was                         accomplished with ease. The patient tolerated the                         procedure  well. Findings:      The esophagus was normal.      The stomach was normal.      The cardia and gastric fundus were normal on retroflexion.      A single small angioectasia without bleeding was found in the third       portion of the duodenum. Coagulation for bleeding prevention using argon       plasma at 0.5 liters/minute and 20 watts was successful.      The cardia and gastric fundus were normal on retroflexion. Impression:            - Normal esophagus.                        - Normal stomach.                        - A single non-bleeding angioectasia in the duodenum.                         Treated with argon plasma coagulation (APC).                        - No specimens collected. Recommendation:        - Perform a colonoscopy today.                        - Continue prilosec 40 mg a day for atleast 6 weeks                         s/p apc of duodenum Procedure Code(s):     --- Professional ---                        [phone], Esophagogastroduodenoscopy, flexible,                         transoral; with control of bleeding, any method Diagnosis Code(s):     --- Professional ---                        K31.819, Angiodysplasia of stomach and duodenum                         without bleeding                        D50.0, Iron deficiency anemia secondary to blood loss                         (chronic) CPT copyright 2022 American Medical Association. All rights reserved. The codes documented in this report are  preliminary and upon coder review may  be revised to meet current compliance requirements. Ruel Kung, MD Ruel Kung MD, MD 12/10/2024 10:27:33 AM This report has been signed electronically. Number of Addenda: 0 Note Initiated On: 12/10/2024 9:25 AM Estimated Blood Loss:  Estimated blood loss: none.      [facility]

## 2024-12-10 NOTE — Plan of Care (Signed)

## 2024-12-10 NOTE — Transfer of Care (Signed)
 Immediate Anesthesia Transfer of Care Note  Patient: Robert Burch  Procedure(s) Performed: EGD (ESOPHAGOGASTRODUODENOSCOPY) COLONOSCOPY EGD, WITH ARGON PLASMA COAGULATION COLONOSCOPY, WITH ARGON PLASMA COAGULATION CONTROL OF HEMORRHAGE, GI TRACT, ENDOSCOPIC, BY CLIPPING OR OVERSEWING POLYPECTOMY, INTESTINE  Patient Location: PACU  Anesthesia Type:General  Level of Consciousness: drowsy  Airway & Oxygen Therapy: Patient Spontanous Breathing  Post-op Assessment: Report given to RN and Post -op Vital signs reviewed and stable  Post vital signs: Reviewed and stable  Last Vitals:  Vitals Value Taken Time  BP 111/62   Temp 97.1   Pulse 67   Resp 13   SpO2 95     Last Pain:  Vitals:   12/10/24 0936  TempSrc: Temporal  PainSc: 0-No pain         Complications: No notable events documented.

## 2024-12-10 NOTE — Progress Notes (Signed)
 Mobility Specialist Progress Note:    12/10/24 1749  Mobility  Activity Ambulated with assistance  Level of Assistance Moderate assist, patient does 50-74%  Assistive Device Front wheel walker  Distance Ambulated (ft) 50 ft  Range of Motion/Exercises Active;All extremities  Activity Response Tolerated well  Mobility visit 1 Mobility  Mobility Specialist Start Time (ACUTE ONLY) 1719  Mobility Specialist Stop Time (ACUTE ONLY) 1749  Mobility Specialist Time Calculation (min) (ACUTE ONLY) 30 min   Pt received in bed, family at bedside. Agreeable to mobility, required ModA to stand and CGA to ambulate with RW. Tolerated well, chair follow for safety. Pt c/o lightheadedness during session, HR 57 bpm and BP 126/62; RN notified. Left pt in chair with alarm on and RN at bedside, all needs met.  Sherrilee Ditty Mobility Specialist Please contact via Special Educational Needs Teacher or  Rehab office at [phone]

## 2024-12-10 NOTE — Anesthesia Preprocedure Evaluation (Signed)
 "                                  Anesthesia Evaluation  Patient identified by MRN, date of birth, ID band Patient awake    Reviewed: Allergy & Precautions, H&P , NPO status , Patient's Chart, lab work & pertinent test results, reviewed documented beta blocker date and time   History of Anesthesia Complications Negative for: history of anesthetic complications  Airway Mallampati: III  TM Distance: >3 FB Neck ROM: limited  Mouth opening: Limited Mouth Opening  Dental no notable dental hx. (+) Implants, Dental Advidsory Given, Teeth Intact, Chipped   Pulmonary neg pulmonary ROS   Pulmonary exam normal breath sounds clear to auscultation       Cardiovascular Exercise Tolerance: Good hypertension, (-) angina + CAD and +CHF  (-) Past MI and (-) Cardiac Stents + dysrhythmias Atrial Fibrillation (-) Valvular Problems/Murmurs Rhythm:regular Rate:Normal     Neuro/Psych  PSYCHIATRIC DISORDERS Anxiety     negative neurological ROS     GI/Hepatic Neg liver ROS,GERD  Medicated and Controlled,,  Endo/Other  negative endocrine ROS    Renal/GU Renal disease     Musculoskeletal   Abdominal   Peds  Hematology negative hematology ROS (+)   Anesthesia Other Findings Past Medical History: No date: Atypical chest pain     Comment:  a. Nonobstructive, very minor irregs by cath 2007;  b.               06/2009 Ex MV: small area of apical lateral ischemia;  c.               06/2009 Cath: essentially normal;  d. 04/2014 Lexiscan  MV:               EF 59%, no ischemia/infarct->low risk. No date: Cancer (HCC) No date: Chronic anticoagulation     Comment:  a. Chronic Coumadin  No date: Chronic anxiety No date: Diverticulosis No date: GERD (gastroesophageal reflux disease) No date: Hypertension No date: IBS (irritable bowel syndrome) No date: Nonischemic cardiomyopathy (HCC)     Comment:  a. EF 45% by echo 2010;  b. 05/2012 Echo: EF 55-65%, no               rwma, Gr 1 DD, mild  AI/MR, mildly dil LA. No date: Paralyzed vocal cords No date: Paroxysmal atrial fibrillation (HCC)     Comment:  a. Treated with flecainide  No date: Proteinuria     Comment:  History of   Reproductive/Obstetrics negative OB ROS                              Anesthesia Physical Anesthesia Plan  ASA: 3  Anesthesia Plan: General   Post-op Pain Management: Minimal or no pain anticipated   Induction: Intravenous  PONV Risk Score and Plan: 2 and Propofol  infusion and TIVA  Airway Management Planned: Nasal Cannula  Additional Equipment: None  Intra-op Plan:   Post-operative Plan:   Informed Consent: I have reviewed the patients History and Physical, chart, labs and discussed the procedure including the risks, benefits and alternatives for the proposed anesthesia with the patient or authorized representative who has indicated his/her understanding and acceptance.     Dental advisory given  Plan Discussed with: CRNA and Surgeon  Anesthesia Plan Comments: (Discussed risks of anesthesia with patient, including possibility of difficulty with  spontaneous ventilation under anesthesia necessitating airway intervention, PONV, and rare risks such as cardiac or respiratory or neurological events, and allergic reactions. Discussed the role of CRNA in patient's perioperative care. Patient understands.)        Anesthesia Quick Evaluation  "

## 2024-12-10 NOTE — Progress Notes (Signed)
 " PROGRESS NOTE  Robert Burch FMW:[id number] DOB: 1933/08/09   PCP: Glover Lenis, MD  Patient is from: Home?  DOA: 12/05/2024 LOS: 2  Chief complaints Chief Complaint  Patient presents with   Loss of Consciousness     Brief Narrative / Interim history: 89 year old M with PMH of A-fib on Coumadin , NICM, HTN, anxiety, IBS and GERD presented to ED with lightheadedness and recurrent falls, and admitted with symptomatic anemia.  In ED, stable vitals INR 3.6.  Hgb 8.7 (was 11.1 about a month ago).  Na 130.  CT head and cervical spine without acute finding.  Pelvic x-ray without acute finding.  EKG shows A-fib with rate at 72 bpm.  Hemoccult positive.  GI consulted and patient was admitted.  INR reversed by vitamin K . Cardiology recommended Eliquis  once able to resume anticoagulation.  Hospital course complicated by encephalopathy/delirium that delayed endoscopic evaluation.  Delirium seems to have resolved now.  EGD with normal esophagus and stomach and a single nonbleeding angioectasia in the duodenum treated with APC.  Colonoscopy for polyps ranging from 3 mm to 5 mm that were resected and retrieved, 4 nonbleeding colonic angiectasis treated with argon beam coagulation and pandiverticulosis.  GI recommended Prilosec 40 mg daily at least for 6 weeks and resuming anticoagulation on 12/22..     Subjective: Seen and examined earlier this afternoon after he returned from EGD and colonoscopy. No complaints. Eating breakfast. Son and wife at bedside. Discussed EG and colonoscopy result with patient and family  Assessment and plan: Symptomatic acute blood loss anemia: Suspect upper GI bleed.  Hemoccult was positive.  INR 3.5>> 1.3.  H&H stable.  Anemia panel without significant nutritional deficiency.  EGD and colonoscopy as above. Recent Labs    12/20/23 1518 10/29/24 1513 10/30/24 0335 12/05/24 1108 12/05/24 1735 12/06/24 0500 12/06/24 1850 12/08/24 0449 12/09/24 0418  12/10/24 0516  HGB 12.6* 11.0* 11.1* 8.7* 8.7* 8.6* 8.5* 8.6* 9.2* 9.0*  -Continue holding warfarin.  -Change IV Protonix  to p.o. GI recommended Prilosec 40 mg daily for 6 weeks after APC -Okay to start anticoagulation on 12/22 per GI -Monitor H&H, and transfuse for Hgb <7.0   Hospital delirium/encephalopathy: Could be due to IV morphine .  Resolving.  Currently awake and oriented x 4 except date.  Encephalopathy workup including CT head, UA, VBG, TSH, B12, ammonia and RPR unrevealing. -Minimize avoid sedating medications.  Morphine  discontinued. -Reorientation and delirium precaution   Paroxysmal atrial fibrillation with bradycardia: HR ranges from 40s to 60s.  Not on rate or rhythm control meds.  Positive chronotropic effect to exertion.  CHA2DS2-VASc score 4. -Cardiology recommended switching warfarin to Eliquis  after GI evaluation -Optimize electrolyte -Appreciate input by cardiology.   NICM/HFmrEF-TTE n 11/11 with LVEF of 45 to 50%, normal DD.  On p.o. Lasix  40 mg daily.  Appears euvolemic -Continue holding Lasix .  Might not need this moving forward. -Monitor fluid status  Essential hypertension: Normotensive for most part. -P.o. hydralazine  as needed.  GERD - PPI as above.   Hyponatremia: Resolved.  History of BPH - Continue Proscar  and Flomax .   Obesity class I: Body mass index is 28.42 kg/m.          DVT prophylaxis:  SCDs Start: 12/05/24 1459  Code Status: Full code Family Communication:Updated patient's wife and son at bedside Level of care: Telemetry Status is: Observation The patient will remain inpatient because: Encephalopathy, delirium, acute blood loss anemia and GI bleed   Final disposition: Likely home once medically stable  35 minutes with more than 50% spent in reviewing records, counseling patient/family and coordinating care.  Consultants:  Gastroenterology Cardiology  Procedures: 12/21-EGD with normal esophagus and stomach and a  single nonbleeding angioectasia in the duodenum treated with APC.   12/21-colonoscopy for polyps ranging from 3 mm to 5 mm that were resected and retrieved, 4 nonbleeding colonic angiectasis treated with argon beam coagulation and pandiverticulosis.  Microbiology summarized: None  Objective: Vitals:   12/10/24 1106 12/10/24 1115 12/10/24 1130 12/10/24 1248  BP: 111/62 125/66 (!) 145/78 126/64  Pulse: (!) 41 (!) 58 62 (!) 57  Resp: 13 13 14 16   Temp: (!) 97.1 F (36.2 C)   (!) 97.3 F (36.3 C)  TempSrc:    Axillary  SpO2: 94% 93% 94% 99%  Weight:      Height:        Examination:  GENERAL: No apparent distress.  Nontoxic. HEENT: MMM.  Vision and hearing grossly intact.  NECK: Supple.  No apparent JVD.  RESP:  No IWOB.  Fair aeration bilaterally. CVS: Irregular rhythm. HR in 50's. Heart sounds normal.  ABD/GI/GU: BS+. Abd soft, NTND.  MSK/EXT:  Moves extremities. No apparent deformity.  SKIN: no apparent skin lesion or wound NEURO: Awake and alert.  Oriented x 4 except date.  Follows commands.  Sch Meds:  Scheduled Meds:  ferrous sulfate   325 mg Oral Q breakfast   finasteride   5 mg Oral Daily   multivitamin with minerals  1 tablet Oral Daily   pantoprazole  (PROTONIX ) IV  40 mg Intravenous Q12H   polyethylene glycol-electrolytes  4,000 mL Oral Once   tamsulosin   0.4 mg Oral QPC breakfast   Continuous Infusions:  sodium chloride  10 mL/hr at 12/09/24 1716    PRN Meds:.acetaminophen  **OR** acetaminophen , alum & mag hydroxide-simeth, hydrALAZINE , ondansetron  **OR** ondansetron  (ZOFRAN ) IV  Antimicrobials: Anti-infectives (From admission, onward)    None        I have personally reviewed the following labs and images: CBC: Recent Labs  Lab 12/06/24 0500 12/06/24 1850 12/08/24 0449 12/09/24 0418 12/10/24 0516  WBC 10.3 11.2* 8.4 8.1 7.3  HGB 8.6* 8.5* 8.6* 9.2* 9.0*  HCT 26.2* 25.8* 26.3* 28.5* 27.7*  MCV 83.2 82.7 84.8 85.1 85.2  PLT 279 290 299 333 351    BMP &GFR Recent Labs  Lab 12/05/24 1108 12/07/24 1249 12/07/24 1257 12/08/24 0449 12/09/24 0418 12/10/24 0516  NA 130* 134*  --  135 136 137  K 4.2 4.6  --  5.0 4.7 4.3  CL 94* 100  --  102 104 106  CO2 26 25  --  25 26 24   GLUCOSE 86 107*  --  94 101* 118*  BUN 23 22  --  20 16 12   CREATININE 1.04 0.97  --  0.93 0.79 0.81  CALCIUM  8.7* 8.1*  --  8.0* 8.0* 8.0*  MG  --   --  2.3 2.2 2.2 2.1  PHOS  --  2.9  --   --  2.6 2.1*   Estimated Creatinine Clearance: 67.6 mL/min (by C-G formula based on SCr of 0.81 mg/dL). Liver & Pancreas: Recent Labs  Lab 12/05/24 1108 12/07/24 1249 12/08/24 0449 12/09/24 0418 12/10/24 0516  AST 26  --  22  --   --   ALT 20  --  13  --   --   ALKPHOS 42  --  42  --   --   BILITOT 1.1  --  1.2  --   --  PROT 6.4*  --  5.6*  --   --   ALBUMIN 4.0 3.4* 3.4* 3.5 3.5   No results for input(s): LIPASE, AMYLASE in the last 168 hours. Recent Labs  Lab 12/07/24 1257  AMMONIA 24   Diabetic: No results for input(s): HGBA1C in the last 72 hours. No results for input(s): GLUCAP in the last 168 hours. Cardiac Enzymes: No results for input(s): CKTOTAL, CKMB, CKMBINDEX, TROPONINI in the last 168 hours. No results for input(s): PROBNP in the last 8760 hours. Coagulation Profile: Recent Labs  Lab 12/05/24 1108 12/06/24 0500 12/06/24 1328 12/07/24 0458 12/08/24 0449  INR 3.6* 3.5* 3.2* 1.5* 1.3*   Thyroid Function Tests: Recent Labs    12/07/24 1257  TSH 2.910   Lipid Profile: No results for input(s): CHOL, HDL, LDLCALC, TRIG, CHOLHDL, LDLDIRECT in the last 72 hours. Anemia Panel: Recent Labs    12/07/24 1257 12/07/24 1258  VITAMINB12  --  499  FOLATE 16.3  --   FERRITIN 155  --   TIBC 235*  --   IRON 24*  --    Urine analysis:    Component Value Date/Time   COLORURINE YELLOW (A) 12/07/2024 1439   APPEARANCEUR CLOUDY (A) 12/07/2024 1439   APPEARANCEUR Cloudy (A) 07/12/2024 0825   LABSPEC 1.018  12/07/2024 1439   PHURINE 7.0 12/07/2024 1439   GLUCOSEU NEGATIVE 12/07/2024 1439   HGBUR NEGATIVE 12/07/2024 1439   BILIRUBINUR NEGATIVE 12/07/2024 1439   BILIRUBINUR Negative 07/12/2024 0825   KETONESUR NEGATIVE 12/07/2024 1439   PROTEINUR NEGATIVE 12/07/2024 1439   NITRITE NEGATIVE 12/07/2024 1439   LEUKOCYTESUR SMALL (A) 12/07/2024 1439   Sepsis Labs: Invalid input(s): PROCALCITONIN, LACTICIDVEN  Microbiology: No results found for this or any previous visit (from the past 240 hours).  Radiology Studies: No results found.    Bettey Muraoka T. Glendell Schlottman [facility]  If 7PM-7AM, please contact night-coverage [email or web address] 12/10/2024, 12:51 PM   "

## 2024-12-10 NOTE — Anesthesia Postprocedure Evaluation (Signed)
"   Anesthesia Post Note  Patient: [name]  Procedure(s) Performed: EGD (ESOPHAGOGASTRODUODENOSCOPY) COLONOSCOPY EGD, WITH ARGON PLASMA COAGULATION COLONOSCOPY, WITH ARGON PLASMA COAGULATION CONTROL OF HEMORRHAGE, GI TRACT, ENDOSCOPIC, BY CLIPPING OR OVERSEWING POLYPECTOMY, INTESTINE  Patient location during evaluation: Endoscopy Anesthesia Type: General Level of consciousness: awake and alert Pain management: pain level controlled Vital Signs Assessment: post-procedure vital signs reviewed and stable Respiratory status: spontaneous breathing, nonlabored ventilation, respiratory function stable and patient connected to nasal cannula oxygen Cardiovascular status: blood pressure returned to baseline and stable Postop Assessment: no apparent nausea or vomiting Anesthetic complications: no   No notable events documented.   Last Vitals:  Vitals:   12/10/24 1931 12/10/24 1933  BP: (!) 111/100 (!) 129/58  Pulse: (!) 43 60  Resp: 18   Temp: 36.4 C   SpO2: (!) 89% 100%    Last Pain:  Vitals:   12/10/24 1248  TempSrc: Axillary  PainSc:                  [name]      "

## 2024-12-11 ENCOUNTER — Other Ambulatory Visit: Payer: Self-pay

## 2024-12-11 ENCOUNTER — Encounter: Payer: Self-pay | Admitting: Gastroenterology

## 2024-12-11 ENCOUNTER — Telehealth (HOSPITAL_COMMUNITY): Payer: Self-pay

## 2024-12-11 ENCOUNTER — Other Ambulatory Visit (HOSPITAL_COMMUNITY): Payer: Self-pay

## 2024-12-11 DIAGNOSIS — E782 Mixed hyperlipidemia: Secondary | ICD-10-CM | POA: Diagnosis not present

## 2024-12-11 DIAGNOSIS — N400 Enlarged prostate without lower urinary tract symptoms: Secondary | ICD-10-CM | POA: Diagnosis not present

## 2024-12-11 DIAGNOSIS — R55 Syncope and collapse: Secondary | ICD-10-CM | POA: Diagnosis not present

## 2024-12-11 DIAGNOSIS — K922 Gastrointestinal hemorrhage, unspecified: Secondary | ICD-10-CM | POA: Diagnosis not present

## 2024-12-11 DIAGNOSIS — K219 Gastro-esophageal reflux disease without esophagitis: Secondary | ICD-10-CM | POA: Diagnosis not present

## 2024-12-11 DIAGNOSIS — I480 Paroxysmal atrial fibrillation: Secondary | ICD-10-CM | POA: Diagnosis not present

## 2024-12-11 DIAGNOSIS — I10 Essential (primary) hypertension: Secondary | ICD-10-CM | POA: Diagnosis not present

## 2024-12-11 DIAGNOSIS — D649 Anemia, unspecified: Secondary | ICD-10-CM | POA: Diagnosis not present

## 2024-12-11 LAB — RENAL FUNCTION PANEL
Albumin: 3.3 g/dL — ABNORMAL LOW (ref 3.5–5.0)
Anion gap: 8 (ref 5–15)
BUN: 13 mg/dL (ref 8–23)
CO2: 24 mmol/L (ref 22–32)
Calcium: 8.1 mg/dL — ABNORMAL LOW (ref 8.9–10.3)
Chloride: 104 mmol/L (ref 98–111)
Creatinine, Ser: 0.89 mg/dL (ref 0.61–1.24)
GFR, Estimated: >60 mL/min
Glucose, Bld: 119 mg/dL — ABNORMAL HIGH (ref 70–99)
Phosphorus: 2.9 mg/dL (ref 2.5–4.6)
Potassium: 4.3 mmol/L (ref 3.5–5.1)
Sodium: 135 mmol/L (ref 135–145)

## 2024-12-11 LAB — MAGNESIUM: Magnesium: 2.1 mg/dL (ref 1.7–2.4)

## 2024-12-11 LAB — CBC
HCT: 27 % — ABNORMAL LOW (ref 39.0–52.0)
Hemoglobin: 8.8 g/dL — ABNORMAL LOW (ref 13.0–17.0)
MCH: 27.6 pg (ref 26.0–34.0)
MCHC: 32.6 g/dL (ref 30.0–36.0)
MCV: 84.6 fL (ref 80.0–100.0)
Platelets: 356 K/uL (ref 150–400)
RBC: 3.19 MIL/uL — ABNORMAL LOW (ref 4.22–5.81)
RDW: 17.1 % — ABNORMAL HIGH (ref 11.5–15.5)
WBC: 7.8 K/uL (ref 4.0–10.5)
nRBC: 0 % (ref 0.0–0.2)

## 2024-12-11 MED ORDER — APIXABAN 5 MG PO TABS
5.0000 mg | ORAL_TABLET | Freq: Two times a day (BID) | ORAL | Status: DC
  Administered 2024-12-11 – 2024-12-12 (×3): 5 mg via ORAL
  Filled 2024-12-11 (×3): qty 1

## 2024-12-11 MED ORDER — POLYETHYLENE GLYCOL 3350 17 GM/SCOOP PO POWD
17.0000 g | Freq: Two times a day (BID) | ORAL | 1 refills | Status: DC | PRN
  Filled 2024-12-11: qty 238, 7d supply, fill #0

## 2024-12-11 MED ORDER — FUROSEMIDE 20 MG PO TABS: 20.0000 mg | ORAL_TABLET | Freq: Every day | ORAL | Status: AC

## 2024-12-11 MED ORDER — FUROSEMIDE 40 MG PO TABS
40.0000 mg | ORAL_TABLET | Freq: Every day | ORAL | Status: DC
  Administered 2024-12-11: 40 mg via ORAL
  Filled 2024-12-11: qty 1

## 2024-12-11 MED ORDER — FUROSEMIDE 20 MG PO TABS
20.0000 mg | ORAL_TABLET | Freq: Every day | ORAL | Status: DC
  Administered 2024-12-12: 20 mg via ORAL
  Filled 2024-12-11: qty 1

## 2024-12-11 MED ORDER — APIXABAN 5 MG PO TABS: 5.0000 mg | ORAL_TABLET | Freq: Two times a day (BID) | ORAL | Status: AC

## 2024-12-11 MED ORDER — APIXABAN 5 MG PO TABS
5.0000 mg | ORAL_TABLET | Freq: Two times a day (BID) | ORAL | 0 refills | Status: DC
  Filled 2024-12-11: qty 60, 30d supply, fill #0

## 2024-12-11 MED ORDER — POLYETHYLENE GLYCOL 3350 17 GM/SCOOP PO POWD: 17.0000 g | Freq: Two times a day (BID) | ORAL | Status: AC | PRN

## 2024-12-11 NOTE — Telephone Encounter (Signed)
 Pharmacy Patient Advocate Encounter  Insurance verification completed.    The patient is insured through [facility]. Patient has Medicare and is not eligible for a copay card, but may be able to apply for patient assistance or Medicare RX Payment Plan (Patient Must reach out to their plan, if eligible for payment plan), if available.    Ran test claim for Eliquis  5mg  tablet and the current 30 day co-pay is $83.   This test claim was processed through [facility]- copay amounts may vary at other pharmacies due to [employer], or as the patient moves through the different stages of their insurance plan.

## 2024-12-11 NOTE — Plan of Care (Signed)

## 2024-12-11 NOTE — Care Management Important Message (Signed)
 Important Message  Patient Details  Name: Robert Burch MRN: [id number] Date of Birth: 1933-03-17   Important Message Given:        Robert Burch 12/11/2024, 4:43 PM

## 2024-12-11 NOTE — Progress Notes (Signed)
 Physical Therapy Treatment Patient Details Name: Robert Burch MRN: [id number] DOB: [birth year] Today's Date: 12/11/2024   History of Present Illness Robert Burch is a 89 y.o. male with medical history significant of A-fib on Coumadin , nonischemic cardiomyopathy, chronic anxiety, IBS, hypertension, GERD presented to the emergency department for evaluation of lightheadedness, fall.  Patient states that he had fallen 10 days ago, does have bilateral hip and knee pain, bruise over his thigh.  Today patient reported dizziness, lightheadedness before fall, fell front side, hit his head. All imaging negative for acute injury.    PT Comments  Pt standing at sink with OT upon arrival for ADL task.  Just finished walking in hallway about 100' with cga/min a x 1 with some c/o light headedness.  He is assisted to EOB and orthostatic vitals taken and documented in flow sheets.  He remains with drop in BP from 138/58 P 57 in sitting to 89/47 P 60 with 3 minute standing.  He does endorse some light headedness with standing.  Upon sitting BP increases to [facility] and symptoms resolve.  He does report some general fatigue from activity.  When questioned what he would do at home he said find something solid to hold onto.  Education provided about sitting down if symptomatic.  Family in room and stated he is orthostatic at baseline.    Pt would benefit from continued therapy at discharge < 3hrs a day for continued strengthening and education regarding safety and managing of orthostatic symptoms at home.     If plan is discharge home, recommend the following: A little help with walking and/or transfers;Assistance with cooking/housework;Assistance with feeding;Direct supervision/assist for medications management;Direct supervision/assist for financial management;Assist for transportation;Help with stairs or ramp for entrance;Supervision due to cognitive status;A little help with bathing/dressing/bathroom    Can travel by private vehicle        Equipment Recommendations       Recommendations for Other Services       Precautions / Restrictions Precautions Precautions: Fall Recall of Precautions/Restrictions: Intact Precaution/Restrictions Comments: orthostatic Restrictions Weight Bearing Restrictions Per Provider Order: No     Mobility  Bed Mobility Overal bed mobility: Needs Assistance Bed Mobility: Sit to Supine       Sit to supine: Min assist     Patient Response: Cooperative  Transfers Overall transfer level: Needs assistance Equipment used: Rolling walker (2 wheels) Transfers: Sit to/from Stand Sit to Stand: Min assist                Ambulation/Gait         Gait velocity: decr     General Gait Details: recently walked with OT prior to arrival in room 100' with cga/min a with post lean and c/o light headedness   Stairs             Wheelchair Mobility     Tilt Bed Tilt Bed Patient Response: Cooperative  Modified Rankin (Stroke Patients Only)       Balance Overall balance assessment: Needs assistance Sitting-balance support: Feet supported Sitting balance-Leahy Scale: Good     Standing balance support: Bilateral upper extremity supported, During functional activity, Reliant on assistive device for balance Standing balance-Leahy Scale: Fair Standing balance comment: some post lean and light headedness increasing fall risk                            Communication Communication Communication: Impaired Factors Affecting Communication: Hearing impaired;Difficulty expressing  self  Cognition Arousal: Alert Behavior During Therapy: WFL for tasks assessed/performed   PT - Cognitive impairments: Problem solving, Sequencing, Initiation, Awareness, Safety/Judgement                         Following commands: Impaired Following commands impaired: Follows one step commands with increased time    Cueing Cueing  Techniques: Verbal cues, Tactile cues  Exercises      General Comments        Pertinent Vitals/Pain Pain Assessment Pain Assessment: 0-10 Pain Score: 5     Home Living                          Prior Function            PT Goals (current goals can now be found in the care plan section) Progress towards PT goals: Progressing toward goals    Frequency    Min 2X/week      PT Plan      Co-evaluation              AM-PAC PT 6 Clicks Mobility   Outcome Measure  Help needed turning from your back to your side while in a flat bed without using bedrails?: A Little Help needed moving from lying on your back to sitting on the side of a flat bed without using bedrails?: A Little Help needed moving to and from a bed to a chair (including a wheelchair)?: A Little Help needed standing up from a chair using your arms (e.g., wheelchair or bedside chair)?: A Little Help needed to walk in hospital room?: A Little Help needed climbing 3-5 steps with a railing? : A Lot 6 Click Score: 17    End of Session Equipment Utilized During Treatment: Gait belt Activity Tolerance: Patient tolerated treatment well;Other (comment) Patient left: in bed;with call bell/phone within reach;with bed alarm set Nurse Communication: Mobility status PT Visit Diagnosis: Other abnormalities of gait and mobility (R26.89);Unsteadiness on feet (R26.81);Muscle weakness (generalized) (M62.81);Difficulty in walking, not elsewhere classified (R26.2);Repeated falls (R29.6);Pain Pain - Right/Left: Left Pain - part of body: Hip     Time: 8891-8874 PT Time Calculation (min) (ACUTE ONLY): 17 min  Charges:    $Therapeutic Activity: 8-22 mins PT General Charges $$ ACUTE PT VISIT: 1 Visit                   Lauraine Gills, PTA 12/11/2024, 11:38 AM

## 2024-12-11 NOTE — TOC Progression Note (Signed)
 Transition of Care [facility]) - Progression Note    Patient Details  Name: Robert Burch MRN: [id number] Date of Birth: [birth year]  [facility] of [facility]) CM/SW Contact  Corean ONEIDA Haddock, RN Phone Number: 12/11/2024, 2:49 PM  Clinical Narrative:      [facility] Per [name] at [facility] tomorrow                    Expected Discharge Plan and Services         Expected Discharge Date: 12/11/24                                     Social Drivers of Health (SDOH) Interventions SDOH Screenings   Food Insecurity: No Food Insecurity (12/06/2024)  Housing: Low Risk (12/06/2024)  Transportation Needs: No Transportation Needs (12/06/2024)  Utilities: Not At Risk (12/06/2024)  Financial Resource Strain: Low Risk  (08/10/2023)   Received from [facility]  Social Connections: Unknown (12/06/2024)  Tobacco Use: Low Risk (12/10/2024)    Readmission Risk Interventions     No data to display

## 2024-12-11 NOTE — TOC Progression Note (Signed)
 Transition of Care [facility]) - Progression Note    Patient Details  Name: Robert Burch MRN: [id number] Date of Birth: 10/05/1933  Transition of Care [facility]) CM/SW Contact  Corean ONEIDA Haddock, RN Phone Number: 12/11/2024, 11:25 AM  Clinical Narrative:        Per MD patient medically stable for discharge.  Spoke to patients wife.  She states she no longer wants [facility], and would like [facility].  Updated [name], accepted [facility] in the [place], and notified [name] at [employer] with [employer] to start auth, awaiting updated therapy notes  Per son [name] and wife [name] they will be able to provide transport at discharge                  Expected Discharge Plan and Services         Expected Discharge Date: 12/11/24                                     Social Drivers of Health (SDOH) Interventions SDOH Screenings   Food Insecurity: No Food Insecurity (12/06/2024)  Housing: Low Risk (12/06/2024)  Transportation Needs: No Transportation Needs (12/06/2024)  Utilities: Not At Risk (12/06/2024)  Financial Resource Strain: Low Risk  (08/10/2023)   Received from [facility]  Social Connections: Unknown (12/06/2024)  Tobacco Use: Low Risk (12/10/2024)    Readmission Risk Interventions     No data to display

## 2024-12-11 NOTE — Discharge Summary (Signed)
 "  Physician Discharge Summary  Robert Burch FMW:[id number] DOB: 03/30/1933 DOA: 12/05/2024  PCP: Glover Lenis, MD  Admit date: 12/05/2024 Discharge date: 12/12/2024  Admitted From: Home Disposition: SNF Recommendations for Outpatient Follow-up:  Cardiology to arrange outpatient follow-up. Monitor hemoglobin GI and PCP f/u Please follow up on the following pending results: Pathology from colonic polyps   Discharge Condition: Stable CODE STATUS: Full code   Contact information for follow-up providers     Glover Lenis, MD Follow up.   Specialty: Family Medicine Contact information: [address] [place] [address] [phone]         Samuella Twyla HERO, PA-C Follow up.   Specialty: Gastroenterology Contact information: [address] [place] [address] [phone]              Contact information for after-discharge care     Destination     [facility] .   Service: Skilled Nursing Contact information: [address] [place]  [phone]                     Hospital course 89 year old M with PMH of A-fib on Coumadin , NICM, orthostatic hypotension, anxiety, IBS and GERD presented to ED with lightheadedness and recurrent falls, and admitted with symptomatic anemia.   In ED, stable vitals INR 3.6.  Hgb 8.7 (was 11.1 about a month ago).  Na 130.  CT head and cervical spine without acute finding.  Pelvic x-ray without acute finding.  EKG shows A-fib with rate at 72 bpm.  Hemoccult positive.  GI consulted and patient was admitted.   INR reversed by vitamin K . Cardiology recommended Eliquis  once able to resume anticoagulation.  Hospital course complicated by encephalopathy/delirium that delayed endoscopic evaluation.  Delirium seems to have resolved now.   EGD with normal esophagus and stomach and a single nonbleeding angioectasia  in the duodenum treated with APC.  Colonoscopy for polyps ranging from 3 mm to 5 mm that were resected and retrieved, 4 nonbleeding colonic angiectasis treated with argon beam coagulation and pandiverticulosis.  GI recommended Prilosec 40 mg daily at least for 6 weeks and resuming anticoagulation on 12/11/2024.  Hospital course notable for orthostatic hypotension.  Recommend compression socks, elevating head of bed at least 30 degrees and excessive lying in bed.   Anticoagulation resumed with Eliquis  on 12/11/2024.  Check CBC in 3 days.  Therapy recommended SNF  See individual problem list below for more.   Problems addressed during this hospitalization Symptomatic acute blood loss anemia: Suspect upper GI bleed.  Hemoccult was positive.  INR 3.5>> 1.3.  H&H stable.  Anemia panel without significant nutritional deficiency.  EGD and colonoscopy as above. Recent Labs    10/30/24 0335 12/05/24 1108 12/05/24 1735 12/06/24 0500 12/06/24 1850 12/08/24 0449 12/09/24 0418 12/10/24 0516 12/11/24 0522 12/12/24 0452  HGB 11.1* 8.7* 8.7* 8.6* 8.5* 8.6* 9.2* 9.0* 8.8* 9.0*  -Continue p.o. Protonix  40 mg daily. GI recommended Prilosec 40 mg daily for 6 weeks after APC -Okay to start anticoagulation on 12/22 per GI.  Eliquis  started -trend hgb -Follow pathology from colon polyps     Hospital delirium/encephalopathy: Could be due to IV morphine .  Resolving.  Currently awake and oriented x 4 except date.  Encephalopathy workup including CT head, UA, VBG, TSH, B12, ammonia and RPR unrevealing. -Minimize avoid sedating medications.  -Reorientation and delirium precaution   Paroxysmal atrial fibrillation with bradycardia: In A-fib but  rate controlled. CHA2DS2-VASc score 4. -Anticoagulation changed to Eliquis  after evaluation by cardiology. 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  On p.o. Lasix  40 mg daily.  Appears euvolemic.   Orthostatic. -Decrease home Lasix  to 20 mg daily. -Outpatient follow-up with cardiology.   Essential hypertension/orthostatic hypotension: Seems chronic.  Orthostatic vitals positive -Decrease Lasix  to 20 mg daily -Compression socks, head of bed elevation and therapy. -Fall precaution   GERD - PPI as above.   Hyponatremia: Resolved.   History of BPH - Continue Proscar  and Flomax .  Medication management -Discontinued medications not taken at home   Obesity class I: Body mass index is 28.42 kg/m.           Consultations: Gastroenterology Cardiology  Time spent 35  minutes  Vital signs Vitals:   12/11/24 1130 12/11/24 1717 12/11/24 2036 12/12/24 0741  BP: 118/66 (!) 127/58 (!) 124/59 (!) 158/82  Pulse: (!) 59 (!) 59 (!) 55 61  Temp:  98 F (36.7 C) 98.3 F (36.8 C) 98.2 F (36.8 C)  Resp:  16 18 16   Height:      Weight:      SpO2:  99% 99% 99%  TempSrc:  Oral       Discharge exam  GENERAL: No apparent distress.  Nontoxic. NECK: Supple.    RESP:  ctab CVS: Irregular rhythm.  Normal rate.    ABD/GI/GU:   Abd soft, NTND.  MSK/EXT:  Moves extremities. No apparent deformity. No edema.  SKIN: no apparent skin lesion or wound NEURO: Awake and alert. Moving all 4 PSYCH: Calm. Normal affect.   Discharge Instructions Discharge Instructions     Diet - low sodium heart healthy   Complete by: As directed    Discharge instructions   Complete by: As directed    It has been a pleasure taking care of you!  You were hospitalized due to rectal bleed and anemia for which you had endoscopic evaluation.  Your hemoglobin/blood level remained stable after we stopped your warfarin.  We have started you on Eliquis  for atrial fibrillation.  Please review your new medication list and the directions on your medications before you take them.  Avoid any over-the-counter pain medication other than plain Tylenol  while taking Eliquis .  Follow-up with your primary care doctor in 1  to 2 weeks or sooner if needed.  Follow-up with your cardiologist per their recommendation.   Take care,   Increase activity slowly   Complete by: As directed    Increase activity slowly   Complete by: As directed       Allergies as of 12/12/2024       Reactions   Ciprofloxacin    Diarrhea and stomach pains   Lanoxin [digoxin]    Lopressor [metoprolol Tartrate]    Penicillins    Other reaction(s): Unknown   Sulfonamide Derivatives    Rash   Verapamil    Sulfa Antibiotics Rash        Medication List     STOP taking these medications    magnesium  oxide 400 MG tablet Commonly known as: MAG-OX   predniSONE 20 MG tablet Commonly known as: DELTASONE   traMADol  50 MG tablet Commonly known as: ULTRAM    warfarin 2 MG tablet Commonly known as: COUMADIN        TAKE these medications    acetaminophen  325 MG tablet Commonly known as: TYLENOL  Take 650 mg by mouth every 6 (six) hours as  needed.   alendronate  70 MG tablet Commonly known as: FOSAMAX  Take 1 tablet (70 mg total) by mouth once a week.   apixaban  5 MG Tabs tablet Commonly known as: ELIQUIS  Take 1 tablet (5 mg total) by mouth 2 (two) times daily.   finasteride  5 MG tablet Commonly known as: PROSCAR  Take 1 tablet (5 mg total) by mouth daily.   furosemide  20 MG tablet Commonly known as: LASIX  Take 1 tablet (20 mg total) by mouth daily. What changed:  medication strength how much to take   pantoprazole  40 MG tablet Commonly known as: PROTONIX  Take 1 tablet (40 mg total) by mouth daily. Start taking on: December 13, 2024 What changed:  medication strength how much to take   polyethylene glycol powder 17 GM/SCOOP powder Commonly known as: MiraLax  Take 17 g by mouth 2 (two) times daily as needed for mild constipation.   tamsulosin  0.4 MG Caps capsule Commonly known as: FLOMAX  One capsule po at bedtime (start on 10/07/23)         Procedures/Studies: 12/22-EGD with normal esophagus and  stomach and a single nonbleeding angioectasia in the duodenum treated with APC. 12/22-colonoscopy for polyps ranging from 3 mm to 5 mm that were resected and retrieved, 4 nonbleeding colonic angiectasis treated with argon beam coagulation and pandiverticulosis.    DG Pelvis 1-2 Views Result Date: 12/05/2024 EXAM: 1 or 2 VIEW(S) XRAY OF THE PELVIS 12/05/2024 12:16:00 PM COMPARISON: Comparison with 03/17/2020. CLINICAL HISTORY: Hip pain after a fall. FINDINGS: BONES AND JOINTS: Postoperative changes with right hip hemiarthroplasty. The femoral stem is not completely included within the field of view. The visualized portion of the arthroplasty appears well seated. No acute fracture or dislocation. The pelvis appears intact. SI joints and symphysis pubis are not displaced. Degenerative changes in the lower lumbar spine. SOFT TISSUES: The soft tissues are unremarkable. IMPRESSION: 1. No acute fracture or dislocation. 2. Right hip hemiarthroplasty with the visualized portion appearing well seated. Electronically signed by: Elsie Gravely MD 12/05/2024 12:34 PM EST RP Workstation: [id number]   CT Cervical Spine Wo Contrast Result Date: 12/05/2024 EXAM: CT CERVICAL SPINE WITHOUT CONTRAST 12/05/2024 12:06:56 PM TECHNIQUE: CT of the cervical spine was performed without the administration of intravenous contrast. Multiplanar reformatted images are provided for review. Automated exposure control, iterative reconstruction, and/or weight based adjustment of the mA/kV was utilized to reduce the radiation dose to as low as reasonably achievable. COMPARISON: CT of the cervical spine dated 03/16/2020. CLINICAL HISTORY: Neck trauma (Age >= 65y). FINDINGS: BONES AND ALIGNMENT: There is no evidence of acute traumatic injury. There is slight degenerative anterolisthesis at C5-C6. There is a mild levoscoliotic curvature of the cervical spine. The left facets at C2-C3 are fused and the right facets at C3-C4 are fused.  DEGENERATIVE CHANGES: There is moderate bilateral facet arthrosis. SOFT TISSUES: No prevertebral soft tissue swelling. IMPRESSION: 1. No evidence of acute traumatic injury. 2. 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 3. Slight degenerative event of listhesis at C5-6. 4. Fused left facets at C2-3 and right facets at C3-4. 5. Moderate bilateral facet arthrosis. Electronically signed by: Evalene Coho MD 12/05/2024 12:13 PM EST RP Workstation: [id number]   CT HEAD WO CONTRAST Result Date: 12/05/2024 EXAM: CT HEAD WITHOUT CONTRAST 12/05/2024 12:06:56 PM TECHNIQUE: CT of the head was performed without the administration of intravenous contrast. Automated exposure control, iterative reconstruction, and/or weight based adjustment of the mA/kV was utilized to reduce the radiation dose to as low as reasonably achievable. COMPARISON: CT  of the head dated 03/16/2020. CLINICAL HISTORY: Head trauma, minor (Age >= 65y). FINDINGS: BRAIN AND VENTRICLES: No acute hemorrhage. No evidence of acute infarct. No extra-axial collection. No mass effect or midline shift. Patchy and confluent areas of decreased attenuation throughout the deep and periventricular white matter of the cerebral hemispheres bilaterally, compatible with chronic microvascular ischemic disease. Cerebral ventricle sizes concordant with the degree of cerebral volume loss. Atherosclerotic calcifications within the cavernous internal carotid and vertebral arteries. ORBITS: No acute abnormality. Bilateral lens replacement. SINUSES: No acute abnormality. Left mastoid air cell effusion. SOFT TISSUES AND SKULL: No acute soft tissue abnormality. No skull fracture. IMPRESSION: 1. No acute intracranial abnormality related to the head trauma. 2. Chronic microvascular ischemic disease in the deep and periventricular white matter of the cerebral hemispheres bilaterally. 3. Cerebral volume loss with ventricle sizes concordant with the degree of volume loss. 4.  Atherosclerotic calcifications within the cavernous internal carotid and vertebral arteries. 5. Left mastoid air cell effusion. Electronically signed by: Evalene Coho MD 12/05/2024 12:10 PM EST RP Workstation: [id number]       The results of significant diagnostics from this hospitalization (including imaging, microbiology, ancillary and laboratory) are listed below for reference.     Microbiology: No results found for this or any previous visit (from the past 240 hours).   Labs:  CBC: Recent Labs  Lab 12/08/24 0449 12/09/24 0418 12/10/24 0516 12/11/24 0522 12/12/24 0452  WBC 8.4 8.1 7.3 7.8 9.1  HGB 8.6* 9.2* 9.0* 8.8* 9.0*  HCT 26.3* 28.5* 27.7* 27.0* 27.5*  MCV 84.8 85.1 85.2 84.6 84.1  PLT 299 333 351 356 353   BMP &GFR Recent Labs  Lab 12/07/24 1249 12/07/24 1257 12/08/24 0449 12/09/24 0418 12/10/24 0516 12/11/24 0522  NA 134*  --  135 136 137 135  K 4.6  --  5.0 4.7 4.3 4.3  CL 100  --  102 104 106 104  CO2 25  --  25 26 24 24   GLUCOSE 107*  --  94 101* 118* 119*  BUN 22  --  20 16 12 13   CREATININE 0.97  --  0.93 0.79 0.81 0.89  CALCIUM  8.1*  --  8.0* 8.0* 8.0* 8.1*  MG  --  2.3 2.2 2.2 2.1 2.1  PHOS 2.9  --   --  2.6 2.1* 2.9   Estimated Creatinine Clearance: 61.5 mL/min (by C-G formula based on SCr of 0.89 mg/dL). Liver & Pancreas: Recent Labs  Lab 12/05/24 1108 12/07/24 1249 12/08/24 0449 12/09/24 0418 12/10/24 0516 12/11/24 0522  AST 26  --  22  --   --   --   ALT 20  --  13  --   --   --   ALKPHOS 42  --  42  --   --   --   BILITOT 1.1  --  1.2  --   --   --   PROT 6.4*  --  5.6*  --   --   --   ALBUMIN 4.0 3.4* 3.4* 3.5 3.5 3.3*   No results for input(s): LIPASE, AMYLASE in the last 168 hours. Recent Labs  Lab 12/07/24 1257  AMMONIA 24   Diabetic: No results for input(s): HGBA1C in the last 72 hours. No results for input(s): GLUCAP in the last 168 hours. Cardiac Enzymes: No results for input(s): CKTOTAL, CKMB,  CKMBINDEX, TROPONINI in the last 168 hours. No results for input(s): PROBNP in the last 8760 hours. Coagulation Profile: Recent Labs  Lab 12/05/24 1108 12/06/24 0500 12/06/24 1328 12/07/24 0458 12/08/24 0449  INR 3.6* 3.5* 3.2* 1.5* 1.3*   Thyroid Function Tests: No results for input(s): TSH, T4TOTAL, FREET4, T3FREE, THYROIDAB in the last 72 hours. Lipid Profile: No results for input(s): CHOL, HDL, LDLCALC, TRIG, CHOLHDL, LDLDIRECT in the last 72 hours. Anemia Panel: No results for input(s): VITAMINB12, FOLATE, FERRITIN, TIBC, IRON, RETICCTPCT in the last 72 hours. Urine analysis:    Component Value Date/Time   COLORURINE YELLOW (A) 12/07/2024 1439   APPEARANCEUR CLOUDY (A) 12/07/2024 1439   APPEARANCEUR Cloudy (A) 07/12/2024 0825   LABSPEC 1.018 12/07/2024 1439   PHURINE 7.0 12/07/2024 1439   GLUCOSEU NEGATIVE 12/07/2024 1439   HGBUR NEGATIVE 12/07/2024 1439   BILIRUBINUR NEGATIVE 12/07/2024 1439   BILIRUBINUR Negative 07/12/2024 0825   KETONESUR NEGATIVE 12/07/2024 1439   PROTEINUR NEGATIVE 12/07/2024 1439   NITRITE NEGATIVE 12/07/2024 1439   LEUKOCYTESUR SMALL (A) 12/07/2024 1439   Sepsis Labs: Invalid input(s): PROCALCITONIN, LACTICIDVEN   SIGNED:  Devaughn KATHEE Ban, MD  [facility] 12/12/2024, 11:00 AM   "

## 2024-12-11 NOTE — Progress Notes (Signed)
 Mobility Specialist Progress Note:    12/11/24 1522  Mobility  Activity Ambulated with assistance  Level of Assistance Contact guard assist, steadying assist  Assistive Device Front wheel walker  Distance Ambulated (ft) 150 ft  Range of Motion/Exercises Active;All extremities  Activity Response Tolerated well  Mobility visit 1 Mobility  Mobility Specialist Start Time (ACUTE ONLY) 1507  Mobility Specialist Stop Time (ACUTE ONLY) 1520  Mobility Specialist Time Calculation (min) (ACUTE ONLY) 13 min   Pt received ambulating with NT, MS acquired session. Required CGA to ambulate with RW. Tolerated well, pt denies lightheadedness throughout entire session. Only c/o feeling stiff in BLE.  Returned to room, left pt in fowlers. NT in room, all needs met.  Sherrilee Ditty Mobility Specialist Please contact via Special Educational Needs Teacher or  Rehab office at [phone]

## 2024-12-11 NOTE — Progress Notes (Signed)
 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  The patient has had no other symptoms since then and states he is feeling well.  He also reports that his mentation is back to his baseline.   Objective: Vital signs in last 24 hours: Vitals:   12/10/24 1933 12/11/24 0351 12/11/24 0812 12/11/24 1130  BP: (!) 129/58 (!) 167/96 (!) 151/79 118/66  Pulse: 60 (!) 59 (!) 57 (!) 59  Resp:  17 16   Temp:  97.8 F (36.6 C) 97.6 F (36.4 C)   TempSrc:      SpO2: 100% 100% 99%   Weight:      Height:       Weight change:   Intake/Output Summary (Last 24 hours) at 12/11/2024 1610 Last data filed at 12/11/2024 1522 Gross per 24 hour  Intake 960 ml  Output 1250 ml  Net -290 ml     Exam: General: Patient laying in bed in no apparent distress alert and orientated x 3.   Lab Results: @LABTEST2 @ Micro Results: No results found for this or any previous visit (from the past 240 hours). Studies/Results: No results found. Medications: I have reviewed the patient's current medications. Scheduled Meds:  apixaban   5 mg Oral BID   diclofenac  Sodium  2 g Topical QID   ferrous sulfate   325 mg Oral Q breakfast   finasteride   5 mg Oral Daily   [START ON 12/12/2024] furosemide   20 mg Oral Daily   multivitamin with minerals  1 tablet Oral Daily   pantoprazole   40 mg Oral Daily   polyethylene glycol-electrolytes  4,000 mL Oral Once   tamsulosin   0.4 mg Oral QPC breakfast   Continuous Infusions:  sodium chloride  10 mL/hr at 12/09/24 1716   PRN Meds:.acetaminophen  **OR** acetaminophen , alum & mag hydroxide-simeth, hydrALAZINE , ondansetron  **OR** ondansetron  (ZOFRAN ) IV   Assessment: Principal Problem:   Symptomatic anemia Active Problems:   Essential hypertension   Syncope and collapse    Paroxysmal atrial fibrillation (HCC)   GERD (gastroesophageal reflux disease)   BPH (benign prostatic hyperplasia)   Hyperlipidemia   GI bleed    Plan: This patient came in with symptomatic anemia status post EGD and colonoscopy with AVMs treated with coagulation by Dr. [name] during this admission.  The patient's hemoglobin has been stable with his hemoglobin being 8.8 today.  The patient has been told that he should follow-up with his PCP and have his blood count checked and if it continues to go lower than he should have a capsule endoscopy as an outpatient.  The results of inpatient  capsule endoscopy as has been very limited due to the patient's not being mobile and the capsule not leaving the stomach in the mid past majority of patients.  Therefore the capsule endoscopy should be done as an outpatient if it is deemed that he continues to have iron deficient anemia.  Nothing further to do from GI point of view at this time.  I will sign off.  Please call if any further GI concerns or questions.  We would like to thank you for the opportunity to participate in the care of Robert Burch.    LOS: 3 days   Robert Copping, MD.FACG 12/11/2024, 4:10 PM Pager [phone] 7am-5pm  Check AMION for 5pm -  7am coverage and on weekends  "

## 2024-12-11 NOTE — Progress Notes (Signed)
 Occupational Therapy Treatment Patient Details Name: Robert Burch MRN: [id number] DOB: 1933/08/26 Today's Date: 12/11/2024   History of present illness Robert Burch is a 89 y.o. male with medical history significant of A-fib on Coumadin , nonischemic cardiomyopathy, chronic anxiety, IBS, hypertension, GERD presented to the emergency department for evaluation of lightheadedness, fall.  Patient states that he had fallen 10 days ago, does have bilateral hip and knee pain, bruise over his thigh.  Today patient reported dizziness, lightheadedness before fall, fell front side, hit his head. All imaging negative for acute injury.   OT comments  Patient seen for OT treatment on this date. Upon arrival to room patient resting in bed with family present, agreeable to treatment. Patient performed bed mobility with SBA, reported slight dizziness with sitting but diminished quickly; sit<>stand with min A, reports lightheadedness but reports it's rapidly going away. Patient able to ambulate ~50 feet with rolling walker and min A; cues to maintain wider base of support and for postural techniques with good return demo. Patient stood at sink for 3-4 minutes to perform grooming tasks with slight posterior sway able to correct with min cues. PT present at end of OT visit, notified PT of lightheadedness with position changes, PT to check orthostatics..  Patient ended treatment sitting EOB with PT and family present.        If plan is discharge home, recommend the following:  A lot of help with walking and/or transfers;A lot of help with bathing/dressing/bathroom;Help with stairs or ramp for entrance   Equipment Recommendations  Other (comment) (defer to next venue)    Recommendations for Other Services      Precautions / Restrictions Precautions Precautions: Fall Recall of Precautions/Restrictions: Intact Precaution/Restrictions Comments: orthostatic Restrictions Weight Bearing  Restrictions Per Provider Order: No       Mobility Bed Mobility Overal bed mobility: Needs Assistance Bed Mobility: Supine to Sit     Supine to sit: Contact guard, HOB elevated          Transfers Overall transfer level: Needs assistance Equipment used: Rolling walker (2 wheels) Transfers: Sit to/from Stand Sit to Stand: Min assist           General transfer comment: steadying A, cues for hand placement     Balance                                           ADL either performed or assessed with clinical judgement   ADL Overall ADL's : Needs assistance/impaired     Grooming: Minimal assistance;Standing Grooming Details (indicate cue type and reason): steadying A while standing to brush teeth, cues for sequencing/task initiation                               General ADL Comments: steadying A while in stance    Extremity/Trunk Assessment              Vision       Perception     Praxis     Communication Communication Communication: Impaired Factors Affecting Communication: Hearing impaired;Difficulty expressing self   Cognition Arousal: Alert Behavior During Therapy: WFL for tasks assessed/performed Cognition: History of cognitive impairments             OT - Cognition Comments: disoriented to situation, limited attention to task, problem solving and  sequencing noted during standing grooming tasks                 Following commands: Impaired Following commands impaired: Follows one step commands with increased time      Cueing   Cueing Techniques: Verbal cues, Tactile cues  Exercises Exercises: Other exercises Other Exercises Other Exercises: dynamic standing balance ther act with reaching outside BOS, weight shifting laterally, min A provided    Shoulder Instructions       General Comments      Pertinent Vitals/ Pain       Pain Assessment Pain Assessment: No/denies pain  Home Living                                           Prior Functioning/Environment              Frequency  Min 2X/week        Progress Toward Goals  OT Goals(current goals can now be found in the care plan section)  Progress towards OT goals: Progressing toward goals     Plan      Co-evaluation                 AM-PAC OT 6 Clicks Daily Activity     Outcome Measure   Help from another person eating meals?: None Help from another person taking care of personal grooming?: A Little Help from another person toileting, which includes using toliet, bedpan, or urinal?: A Lot Help from another person bathing (including washing, rinsing, drying)?: A Little Help from another person to put on and taking off regular upper body clothing?: A Little Help from another person to put on and taking off regular lower body clothing?: A Lot 6 Click Score: 17    End of Session Equipment Utilized During Treatment: Gait belt;Rolling walker (2 wheels)  OT Visit Diagnosis: Unsteadiness on feet (R26.81);Repeated falls (R29.6);Muscle weakness (generalized) (M62.81)   Activity Tolerance Patient tolerated treatment well   Patient Left in bed;with nursing/sitter in room;with family/visitor present   Nurse Communication          Time: 8946-8888 OT Time Calculation (min): 18 min  Charges: OT General Charges $OT Visit: 1 Visit OT Treatments $Self Care/Home Management : 8-22 mins  Robert Burch, OT/L MSOT, 12/11/2024

## 2024-12-11 NOTE — Care Management Important Message (Signed)
 Important Message  Patient Details  Name: Robert Burch MRN: [id number] Date of Birth: Jul 10, 1933   Important Message Given:  Yes - Medicare IM     Robert Burch 12/11/2024, 4:44 PM

## 2024-12-12 DIAGNOSIS — D649 Anemia, unspecified: Secondary | ICD-10-CM | POA: Diagnosis not present

## 2024-12-12 LAB — CBC
HCT: 27.5 % — ABNORMAL LOW (ref 39.0–52.0)
Hemoglobin: 9 g/dL — ABNORMAL LOW (ref 13.0–17.0)
MCH: 27.5 pg (ref 26.0–34.0)
MCHC: 32.7 g/dL (ref 30.0–36.0)
MCV: 84.1 fL (ref 80.0–100.0)
Platelets: 353 K/uL (ref 150–400)
RBC: 3.27 MIL/uL — ABNORMAL LOW (ref 4.22–5.81)
RDW: 16.7 % — ABNORMAL HIGH (ref 11.5–15.5)
WBC: 9.1 K/uL (ref 4.0–10.5)
nRBC: 0 % (ref 0.0–0.2)

## 2024-12-12 MED ORDER — PANTOPRAZOLE SODIUM 40 MG PO TBEC: 40.0000 mg | DELAYED_RELEASE_TABLET | Freq: Every day | ORAL | Status: AC

## 2024-12-12 NOTE — TOC Transition Note (Signed)
 Transition of Care [facility]) - Discharge Note   Patient Details  Name: Robert Burch MRN: [id number] Date of Birth: 1933/03/14  [facility] CM/SW Contact:  Alfonso Rummer, LCSW Phone Number: 12/12/2024, 11:41 AM   Clinical Narrative:     Pt will transition to [facility] via family transport. Pt will go to room 503, nursing advised to call report to [facility] Gregory. LCSW A  Rummer called pt spouse at phone listed and left message on voicemail   Final next level of care: Skilled Nursing Facility Barriers to Discharge: Barriers Resolved   Patient Goals and CMS Choice     Choice offered to / list presented to : Patient      Discharge Placement PASRR number recieved: 12/12/24            Patient chooses bed at: [facility] Patient to be transferred to facility by: family transport Name of family member notified: Finas Delone [phone] via phone left message on voicemail Patient and family notified of of transfer: 12/12/24  Discharge Plan and Services Additional resources added to the After Visit Summary for                                       Social Drivers of Health (SDOH) Interventions SDOH Screenings   Food Insecurity: No Food Insecurity (12/06/2024)  Housing: Low Risk (12/06/2024)  Transportation Needs: No Transportation Needs (12/06/2024)  Utilities: Not At Risk (12/06/2024)  Financial Resource Strain: Low Risk  (08/10/2023)   Received from [facility]  Social Connections: Unknown (12/06/2024)  Tobacco Use: Low Risk (12/10/2024)     Readmission Risk Interventions     No data to display

## 2024-12-12 NOTE — Progress Notes (Signed)
 Report given to Alan, CHARITY FUNDRAISER at [employer] [employer]. Pt to be transported via family.

## 2024-12-13 LAB — SURGICAL PATHOLOGY

## 2024-12-15 NOTE — Progress Notes (Signed)
 HPI:  Robert Burch is a 89 y.o. male who presents today for discussion of the multiple areas of discomfort.  The patient has been evaluated by me in the past and diagnosed with right knee osteoarthritic changes.  He has undergone both viscosupplementation injections, Zilretta injections and steroid injections without significant relief.  Because of this he was referred to undergo a radiofrequency nerve ablation of the right genicular nerves, he has been unable to do this due to his INR.  Since his last evaluation with me he has suffered several falls and is currently in rehab facility.  He was evaluated at 1 point on the walk-in clinic where x-rays were obtained of the lumbar spine and right hip which did not reveal any evidence of fracture or dislocation.  The patient presents today with ongoing right leg pain, he denies any pain at today's appointment but does report pain along the right buttock when he does experience discomfort.  He denies any anterior groin pain.  He does report that occasionally he feels like the pain will radiate down the right leg into the right foot.  The patient does have a history of memory loss and is not a accurate historian, he does present today with his son.  Of note the patient also reports acute onset right shoulder pain beginning yesterday after waking up.  He denies any falls or trauma affecting right shoulder.  Denies any surgical history to right shoulder.  He is right-hand dominant.  He reports increased pain when trying to reach above his head out to the side.  Pain is located along the anterior lateral aspect right shoulder with occasional radiation to the right elbow.  He is taking Tylenol  as needed for discomfort.  Denies any numbness or tingling to the right upper extremity.  Denies any catching or locking symptoms.  Current Outpatient Medications  Medication Sig Dispense Refill   acetaminophen  (TYLENOL ) 325 MG tablet Take 650 mg by mouth every 4 (four) hours  as needed for Pain     alendronate  (FOSAMAX ) 70 MG tablet TAKE 1 TABLET BY MOUTH EVERY 7 DAYS TAKE WITH A FULL GLASS OF WATER. DO NOT LIE DOWN FOR 30 MIN 12 tablet 0   aluminum & magnesium  hydroxide-simethicone , DUH/DRH, (MYLANTA MAXIMUM) 400-400-40 mg/5 mL suspension Take 30 mLs by mouth     ascorbic acid, vitamin C, (VITAMIN C) 500 MG tablet Take 500 mg by mouth once daily     bisacodyL  (DULCOLAX) 5 mg EC tablet Take 5 mg by mouth once daily as needed for Constipation     calcium  carbonate-vitamin D3 (OYSCO 500/D) 500 mg-5 mcg (200 unit) tablet Take 1 tablet by mouth once daily     cefpodoxime  (VANTIN ) 200 MG tablet Take 200 mg by mouth 2 (two) times daily (Patient not taking: Reported on 12/01/2024)     cefUROXime  (CEFTIN ) 500 MG tablet Take 500 mg by mouth 2 (two) times daily with meals (Patient not taking: Reported on 12/01/2024)     clindamycin (CLEOCIN) 150 MG capsule  (Patient not taking: Reported on 12/01/2024)     clotrimazole-betamethasone (LOTRISONE) 1-0.05 % cream APPLY TOPICALLY 2 (TWO) TIMES DAILY TO DRY CRACKED SKIN ON FEET (Patient not taking: Reported on 12/01/2024) 30 g 1   co-enzyme Q-10, ubiquinone, 50 mg capsule Take 50 mg by mouth 2 (two) times daily. (Patient not taking: Reported on 12/01/2024)     cranberry extract 500 mg Cap Take by mouth (Patient not taking: Reported on 12/01/2024)  cyanocobalamin (VITAMIN B12) 1000 MCG tablet Take 1,000 mcg by mouth once daily     diltiazem  (CARDIZEM ) 30 MG tablet Take 1 tablet (30 mg) by mouth once daily at bedtime (Patient not taking: Reported on 12/01/2024)     finasteride  (PROSCAR ) 5 mg tablet Take 5 mg by mouth once daily (Patient not taking: Reported on 12/01/2024)     flaxseed oil Oil Use 1 capsule once daily.     flecainide  (TAMBOCOR ) 100 MG tablet Take 100 mg by mouth every 12 (twelve) hours as needed (as needed (atrial fibrillation or palpitations).)     flecainide  (TAMBOCOR ) 50 MG tablet Take 50 mg by mouth  2 (two) times daily as needed     fluticasone propion-salmeteroL (WIXELA INHUB) 250-50 mcg/dose diskus inhaler INHALE 1 PUFF INTO THE LUNGS 2 (TWO) TIMES DAILY AS NEEDED (Patient not taking: Reported on 12/01/2024) 60 each 2   FUROsemide  (LASIX ) 40 MG tablet Take 40 mg by mouth once daily     GEMTESA  75 mg Tab TAKE 1 TABLET BY MOUTH EVERY DAY (Patient not taking: Reported on 12/01/2024) 90 tablet 1   ginger, Zingiber officinalis, 500 mg Cap Take 1 tablet by mouth once daily (Patient not taking: Reported on 12/01/2024)     hydrocortisone (ANUSOL-HC) 25 mg suppository Place 1 suppository (25 mg total) rectally once daily as needed for Hemorrhoids 30 suppository 1   Lactobacillus acidophilus Cap Take 1 capsule by mouth once daily.     magnesium  oxide (MAG-OX) 400 mg (241.3 mg magnesium ) tablet Take 1 tablet (400 mg total) by mouth 2 (two) times daily 180 tablet 1   multivitamin tablet Take 1 tablet by mouth once daily.     MYRBETRIQ  25 mg ER Tablet Take 25 mg by mouth once daily (Patient not taking: Reported on 12/01/2024)     NITROSTAT  0.4 mg SL tablet Place 1 tablet under the tongue every 5 (five) minutes as needed. (Patient not taking: Reported on 12/01/2024)  6   omega 3-dha-epa-fish oil 1,000 mg (120 mg-180 mg) Cap Take by mouth     pantoprazole  (PROTONIX ) 20 MG DR tablet TAKE 1 TABLET BY MOUTH EVERY DAY 90 tablet 3   polyethylene glycol (MIRALAX ) powder Take 17 g by mouth once daily Mix in 4-8ounces of fluid prior to taking.     predniSONE (DELTASONE) 10 MG tablet Take 6 tabs (60mg ) by mouth followed by 5 tabs (50mg ), then 4 tabs (40mg ), then 3 tabs (30mg ), then 2 tabs (20mg ), then 1 tab (10mg ) 21 tablet 0   saw palmetto  160 MG capsule Take 160 mg by mouth 3 (three) times daily (Patient not taking: Reported on 12/01/2024)     tamsulosin  (FLOMAX ) 0.4 mg capsule Take 0.4 mg by mouth 2 (two) times daily Take 30 minutes after same meal each day.     urea (CEROVEL) 40 % topical cream  Apply topically 2 (two) times daily To thickened toe nails 90 g 2   vitamin B12-folic acid 0.5-1 mg Tab Take by mouth once daily.     vitamin C-vitamin E Cap Take 1 capsule by mouth once daily     warfarin (COUMADIN ) 2 MG tablet Take 1 tablet (2 mg total) by mouth once daily 14 tablet 0   warfarin (COUMADIN ) 5 MG tablet Take 1/2 (one-half) tablet by mouth once daily (Patient taking differently: Take 2.5 mg by mouth at bedtime Takes 2.5mg  once a day) 45 tablet 1   No current facility-administered medications for this visit.   Allergies  Allergen Reactions   Ciprofloxacin Diarrhea   Lanoxin [Digoxin] Other (See Comments)   Lopressor [Metoprolol Tartrate] Other (See Comments)   Penicillin Other (See Comments)   Sulfa (Sulfonamide Antibiotics) Rash   Verapamil Other (See Comments)   Past Medical History:  Diagnosis Date   Anemia    Anxiety    Anxiety disorder 10/17/2013   Last Assessment & Plan:  Anxiety seems to be better controlled on today's visit. 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 Tobie    Family History  Problem Relation Name Age of Onset   Breast cancer Mother     Appendicitis Mother     Cholelithiasis Mother     High blood pressure (Hypertension) Mother     Arthritis Mother     Arthritis Father     Pneumonia Father     Prostate cancer Father     No Known Problems Maternal Grandmother     No Known Problems Maternal Grandfather     No Known Problems Paternal Grandmother     No Known Problems Paternal Grandfather     Diabetes type II Son      Social History   Socioeconomic History   Marital status: Married  Tobacco Use   Smoking status: Never   Smokeless tobacco: Never  Vaping Use   Vaping status: Never Used  Substance and Sexual Activity   Alcohol use: Never    Comment: socially   Drug use: No   Sexual activity: Defer  Social History Narrative   Exercise:  Exercises almost daily for 45 minutes.   Diet:  Red meat once a week.  Fast foods none.  Fried foods none.   Pneumovax:2010   Social Drivers of Corporate Investment Banker Strain: Low Risk  (08/10/2023)   Overall Financial Resource Strain (CARDIA)    Difficulty of Paying Living Expenses: Not hard at all  Food Insecurity: No Food Insecurity (12/06/2024)   Received from [facility]   Hunger Vital Sign    Within the past 12 months, you worried that your food would run out before you got the money to buy more.: Never true    Within the past 12 months, the food you bought just didn't last  and you didn't have money to get more.: Never true  Transportation Needs: No Transportation Needs (12/06/2024)   Received from [facility] - Transportation    In the past 12 months, has lack of  transportation kept you from medical appointments or from getting medications?: No    In the past 12 months, has lack of transportation kept you from meetings, work, or from getting things needed for daily living?: No    Review of Systems:  A comprehensive 14 point ROS was performed, reviewed, and the pertinent orthopaedic findings are documented in the HPI.  Exam: Vitals:   12/15/24 1359  BP: 124/74  Weight: 83.9 kg (185 lb)  Height: 167.6 cm (5' 6)  PainSc:   5  PainLoc: Hip   General/Constitutional: The patient appears to be well-nourished, well-developed, and in no acute distress. Neuro/Psych: Normal mood and affect, oriented to person, place and time. Eyes: Non-icteric.  Pupils are equal, round, and reactive to light, and exhibit synchronous movement. ENT: Unremarkable. Lymphatic: No palpable adenopathy. Respiratory: No wheezes and Non-labored breathing Cardiovascular: No edema, swelling or tenderness, except as noted in detailed exam. Integumentary: No impressive skin lesions present, except as noted in detailed exam. Musculoskeletal: Unremarkable, except as noted in detailed exam.  The patient presents today using in a wheelchair.  Examination of the right shoulder demonstrates no open wound, erythema or ecchymosis.  He does report moderate pain especially palpation along the anterior aspect of the right shoulder more so than the lateral or posterior aspect.  Actively he can achieve close to 90 degrees of forward flexion with gentle assistance he has full forward flexion and full abduction.  With the right shoulder abduction 90 degrees he can tolerate external rotation close to 90 degrees, internal rotation 70 degrees.  He has a negative drop arm test of the right upper extremity.  He does have a positive speeds test.  He is intact light touch about the right upper extremity.  The patient is able to gently forward flex and extend the lumbar spine with mild discomfort.  He does  have some discomfort palpation over the vertebral bodies of the lumbar spine.  Tenderness to palpation along the right sacroiliac joint in addition to over the right sciatic notch.  He denies any pain with logroll maneuver of the right hip. Skin examination of the right knee demonstrates no open wound, erythema or ecchymosis.  The patient does have a mild right knee effusion.  The patient is able to fully extend the right knee with mild underlying crepitus, right knee flexion close to 95 degrees.  The right knee is stable to varus and valgus stress testing at today's visit.  The patient does have moderate pain on palpation along the lateral joint line of the right knee.  The patient denies significant tenderness to palpation along the medial joint line.  He is intact light touch on the right lower extremity.  Cap refills intact to each individual toe.  Dorsalis pedis and posterior tibialis pulse are intact to the right lower extremity.  Negative straight leg raise.  X-rays/MRI/Lab data:  AP, lateral and sunrise views of the right knee were obtained previously in the office and reviewed by me today.  These x-rays do demonstrate severe right knee osteoarthritic changes primarily involving the lateral compartment of the right knee.  The patient is bone-on-bone along the lateral compartment with underlying osteophyte formation along with subchondral sclerosis.  No acute fractures identified.  Moderate effusion is identified.  Previous x-rays of the lumbar spine were reviewed at today's appointment.  These x-rays do demonstrate moderate spondylosis throughout the lumbar spine.  Loss of disc space height throughout the lumbar spine.  Moderate facet spondylosis identified.  No acute compression fracture identified.  Previous x-rays of the right femur were  reviewed by me at today's appointment.  These x-rays demonstrate that he is status post a right hip hemiarthroplasty.  The patient did suffer a essentially  nondisplaced periprosthetic fracture in the past which appears to be well-healed.  No acute abnormalities identified at today's appointment.  AP, Y scapular and axillary views of the right shoulder were obtained today in the office and reviewed by me.  These x-rays demonstrate what appears to be evidence of a high riding humeral head.  There is loss of glenohumeral joint space.  Mild arthritic changes within the [facility] joint.  No acute fractures identified.   Assessment: Encounter Diagnoses  Name Primary?   Acute pain of right shoulder    Right rotator cuff tendonitis Yes   Right rotator cuff tear arthropathy    Lumbar spondylosis    Lumbar radiculopathy    Primary osteoarthritis of right knee    S/P hip hemiarthroplasty     Plan: 1.  Treatment options were discussed today with the patient and his son. 2.  The patient presents today with a variety of complaints.  Difficult to fully evaluate the patient due to his ongoing memory issues and reporting several areas of discomfort at this time. 3.  Pertaining to the right shoulder, x-rays do demonstrate what appears to be evidence of a slightly high riding humerus indicative of a potential chronic rotator cuff tear.  He does have overall good strength and motion at today's appointment.  He was offered and received a right subacromial steroid injection. 4.  Given he does not experience any groin pain in the right lower extremity I do believe the majority of his discomfort is likely related to his underlying lumbar spondylosis and possible underlying lumbar radiculopathy.  The patient is scheduled to follow-up with physiatry next month, encouraged him to keep this appointment. 5.  Pertaining to his right knee he is scheduled to undergo radiofrequency nerve ablation in the future. 6.  He may continue to take Tylenol  as needed for discomfort at this time. 7.  He can contact the clinic if he has any questions, new symptoms well or symptoms  worsen.  Subacromial shoulder injection:  The risks, benefits, and alternatives of the proposed injection were discussed with patient.  The patient states that he understands these and he verbally consents to proceed.  A surgical time out was performed.  The patient's right lateral shoulder was cleaned with an alcohol prep.  Topical skin anesthesia was obtained using ethyl chloride spray.  Using aseptic technique, the subacromial space was injected with a solution of 1 cc of Kenalog-40 (40 mg) and 4 cc of 0.5% Marcaine .  The patient tolerated the procedure well and without any complications.  The patient was counseled as to the expected post-injection course, including the possibility of temporary worsening of symptoms.  He also was counseled as to concerning symptoms or signs, and was instructed to contact our office if any of these should develop.   DOROTHA Gustavo Level, PA-C, CAQ-OS [facility]

## 2024-12-17 ENCOUNTER — Ambulatory Visit: Payer: Self-pay | Admitting: Gastroenterology

## 2025-01-10 ENCOUNTER — Ambulatory Visit: Admitting: Urology

## 2025-01-10 VITALS — BP 109/58 | HR 93 | Ht 68.0 in | Wt 182.6 lb

## 2025-01-10 DIAGNOSIS — N138 Other obstructive and reflux uropathy: Secondary | ICD-10-CM

## 2025-01-10 DIAGNOSIS — N401 Enlarged prostate with lower urinary tract symptoms: Secondary | ICD-10-CM | POA: Diagnosis not present

## 2025-01-10 LAB — BLADDER SCAN AMB NON-IMAGING

## 2025-01-10 MED ORDER — FINASTERIDE 5 MG PO TABS: 5.0000 mg | ORAL_TABLET | Freq: Every day | ORAL | 3 refills | Status: AC

## 2025-01-10 MED ORDER — GEMTESA 75 MG PO TABS: 1.0000 | ORAL_TABLET | Freq: Every day | ORAL | 3 refills | Status: AC

## 2025-01-10 MED ORDER — TAMSULOSIN HCL 0.4 MG PO CAPS: ORAL_CAPSULE | ORAL | 3 refills | Status: AC

## 2025-01-10 NOTE — Patient Instructions (Signed)

## 2025-01-10 NOTE — Progress Notes (Signed)
" ° °  01/10/2025 4:25 PM   Robert Burch [birth year] [id number]  Reason for visit: Follow up urinary symptoms  History: Frail 89 year old male with long history of urinary symptoms, has been on maximal medical therapy with Flomax , finasteride , Gemtesa , PVRs have always been normal Has also had some concerns about possible infections that were treated with doxycycline , but cultures have been negative Cystoscopy August 2025 benign, renal ultrasound with no hydronephrosis Recent hospitalization for GI bleed and anemia  Physical Exam: BP (!) 109/58 (BP Location: Left Arm, Patient Position: Sitting, Cuff Size: Normal)   Pulse 93   Ht 5' 8 (1.727 m)   Wt 182 lb 9.6 oz (82.8 kg)   SpO2 99%   BMI 27.76 kg/m   Today: Worsening nocturia 4-5 times at night and urgency/frequency, Gemtesa  was discontinued for unclear reasons, taking Flomax  and finasteride  only PVR today normal at 27ml Most of the history obtained from his son today Extensive review of recent hospitalization records  Plan:   BPH/OAB/nocturia: Likely multifactorial, need to have realistic expectations in the setting of his normal emptying.  Recommended continuing the Flomax  and finasteride  and going back on the Gemtesa  which was helpful previously.  Risks and benefits discussed, can consider trospium if Gemtesa  not affordable RTC 6 months PVR, sooner if problems  I spent 30 total minutes on the day of the encounter including pre-visit review of the medical record, face-to-face time with the patient, and post visit ordering of labs/imaging/tests.  Extensive review of recent hospitalization records    Robert JAYSON Burnet, MD  [facility] [address] [place], [place] [address] [phone]  "

## 2025-01-11 ENCOUNTER — Ambulatory Visit: Payer: Self-pay | Admitting: Urology

## 2025-02-12 ENCOUNTER — Ambulatory Visit: Admitting: Cardiovascular Disease

## 2025-03-23 ENCOUNTER — Ambulatory Visit: Admitting: Family

## 2025-05-10 ENCOUNTER — Ambulatory Visit: Admitting: Student

## 2025-07-17 ENCOUNTER — Ambulatory Visit: Admitting: Urology
